# Patient Record
Sex: Male | Born: 1950
Health system: Southern US, Community
[De-identification: ages and names within clinical notes are randomized; demographics above are authoritative.]

## PROBLEM LIST (undated history)

## (undated) DIAGNOSIS — G4730 Sleep apnea, unspecified: Secondary | ICD-10-CM

## (undated) DIAGNOSIS — M25569 Pain in unspecified knee: Secondary | ICD-10-CM

## (undated) DIAGNOSIS — E119 Type 2 diabetes mellitus without complications: Secondary | ICD-10-CM

## (undated) DIAGNOSIS — G4700 Insomnia, unspecified: Secondary | ICD-10-CM

## (undated) DIAGNOSIS — R351 Nocturia: Secondary | ICD-10-CM

## (undated) DIAGNOSIS — E669 Obesity, unspecified: Secondary | ICD-10-CM

## (undated) DIAGNOSIS — K7460 Unspecified cirrhosis of liver: Secondary | ICD-10-CM

## (undated) DIAGNOSIS — F4321 Adjustment disorder with depressed mood: Secondary | ICD-10-CM

## (undated) DIAGNOSIS — K7581 Nonalcoholic steatohepatitis (NASH): Secondary | ICD-10-CM

## (undated) DIAGNOSIS — R945 Abnormal results of liver function studies: Secondary | ICD-10-CM

## (undated) DIAGNOSIS — R739 Hyperglycemia, unspecified: Secondary | ICD-10-CM

## (undated) DIAGNOSIS — I10 Essential (primary) hypertension: Secondary | ICD-10-CM

## (undated) DIAGNOSIS — E559 Vitamin D deficiency, unspecified: Secondary | ICD-10-CM

## (undated) DIAGNOSIS — Z0000 Encounter for general adult medical examination without abnormal findings: Secondary | ICD-10-CM

## (undated) DIAGNOSIS — K7469 Other cirrhosis of liver: Secondary | ICD-10-CM

## (undated) DIAGNOSIS — R5383 Other fatigue: Secondary | ICD-10-CM

## (undated) DIAGNOSIS — J209 Acute bronchitis, unspecified: Secondary | ICD-10-CM

## (undated) DIAGNOSIS — B019 Varicella without complication: Secondary | ICD-10-CM

## (undated) DIAGNOSIS — E785 Hyperlipidemia, unspecified: Secondary | ICD-10-CM

## (undated) DIAGNOSIS — L918 Other hypertrophic disorders of the skin: Secondary | ICD-10-CM

## (undated) DIAGNOSIS — F419 Anxiety disorder, unspecified: Secondary | ICD-10-CM

## (undated) DIAGNOSIS — R7989 Other specified abnormal findings of blood chemistry: Secondary | ICD-10-CM

## (undated) HISTORY — DX: Nocturia: R35.1

## (undated) HISTORY — DX: Varicella without complication: B01.9

## (undated) HISTORY — DX: Anxiety disorder, unspecified: F41.9

## (undated) HISTORY — PX: BACK SURGERY: SHX140

## (undated) HISTORY — DX: Obesity, unspecified: E66.9

## (undated) HISTORY — DX: Vitamin D deficiency, unspecified: E55.9

## (undated) HISTORY — DX: Nonalcoholic steatohepatitis (NASH): K75.81

## (undated) HISTORY — DX: Acute bronchitis, unspecified: J20.9

## (undated) HISTORY — PX: OTHER SURGICAL HISTORY: SHX169

## (undated) HISTORY — DX: Adjustment disorder with depressed mood: F43.21

## (undated) HISTORY — PX: VASECTOMY: SHX75

## (undated) HISTORY — DX: Other specified abnormal findings of blood chemistry: R79.89

## (undated) HISTORY — DX: Essential (primary) hypertension: I10

## (undated) HISTORY — PX: TONSILLECTOMY: SHX5217

## (undated) HISTORY — DX: Other fatigue: R53.83

## (undated) HISTORY — DX: Abnormal results of liver function studies: R94.5

## (undated) HISTORY — DX: Pain in unspecified knee: M25.569

## (undated) HISTORY — DX: Insomnia, unspecified: G47.00

## (undated) HISTORY — DX: Other cirrhosis of liver: K74.69

## (undated) HISTORY — DX: Type 2 diabetes mellitus without complications: E11.9

## (undated) HISTORY — PX: TONSILLECTOMY: SUR1361

## (undated) HISTORY — DX: Hyperlipidemia, unspecified: E78.5

## (undated) HISTORY — DX: Hyperglycemia, unspecified: R73.9

## (undated) HISTORY — DX: Unspecified cirrhosis of liver: K74.60

## (undated) HISTORY — DX: Encounter for general adult medical examination without abnormal findings: Z00.00

## (undated) HISTORY — DX: Other hypertrophic disorders of the skin: L91.8

## (undated) HISTORY — DX: Sleep apnea, unspecified: G47.30

---

## 2010-08-12 ENCOUNTER — Encounter: Payer: Self-pay | Admitting: Family Medicine

## 2010-08-12 ENCOUNTER — Ambulatory Visit (INDEPENDENT_AMBULATORY_CARE_PROVIDER_SITE_OTHER): Payer: PRIVATE HEALTH INSURANCE | Admitting: Family Medicine

## 2010-08-12 DIAGNOSIS — E785 Hyperlipidemia, unspecified: Secondary | ICD-10-CM

## 2010-08-12 DIAGNOSIS — G4700 Insomnia, unspecified: Secondary | ICD-10-CM

## 2010-08-12 DIAGNOSIS — Z1211 Encounter for screening for malignant neoplasm of colon: Secondary | ICD-10-CM

## 2010-08-12 DIAGNOSIS — I10 Essential (primary) hypertension: Secondary | ICD-10-CM

## 2010-08-12 DIAGNOSIS — J209 Acute bronchitis, unspecified: Secondary | ICD-10-CM

## 2010-08-12 DIAGNOSIS — R5383 Other fatigue: Secondary | ICD-10-CM

## 2010-08-12 DIAGNOSIS — M109 Gout, unspecified: Secondary | ICD-10-CM

## 2010-08-12 DIAGNOSIS — R351 Nocturia: Secondary | ICD-10-CM

## 2010-08-12 DIAGNOSIS — E669 Obesity, unspecified: Secondary | ICD-10-CM

## 2010-08-12 LAB — HEPATIC FUNCTION PANEL
ALT: 79 U/L — ABNORMAL HIGH (ref 0–53)
AST: 67 U/L — ABNORMAL HIGH (ref 0–37)
Bilirubin, Direct: 0.1 mg/dL (ref 0.0–0.3)
Total Bilirubin: 0.7 mg/dL (ref 0.3–1.2)
Total Protein: 7.8 g/dL (ref 6.0–8.3)

## 2010-08-12 LAB — CBC WITH DIFFERENTIAL/PLATELET
Basophils Relative: 0.3 % (ref 0.0–3.0)
Eosinophils Relative: 2.1 % (ref 0.0–5.0)
HCT: 50.2 % (ref 39.0–52.0)
Lymphs Abs: 2 10*3/uL (ref 0.7–4.0)
Monocytes Relative: 7.2 % (ref 3.0–12.0)
Platelets: 291 10*3/uL (ref 150.0–400.0)
RBC: 5.18 Mil/uL (ref 4.22–5.81)
WBC: 8.8 10*3/uL (ref 4.5–10.5)

## 2010-08-12 LAB — TSH: TSH: 1.09 u[IU]/mL (ref 0.35–5.50)

## 2010-08-12 LAB — RENAL FUNCTION PANEL
Chloride: 104 mEq/L (ref 96–112)
GFR: 65.38 mL/min (ref >60.00)
Phosphorus: 3.3 mg/dL (ref 2.3–4.6)
Potassium: 4.5 mEq/L (ref 3.5–5.1)

## 2010-08-12 LAB — LIPID PANEL
LDL Cholesterol: 71 mg/dL (ref 0–99)
Total CHOL/HDL Ratio: 3

## 2010-08-12 MED ORDER — CEFDINIR 300 MG PO CAPS: 300.0000 mg | ORAL_CAPSULE | Freq: Two times a day (BID) | ORAL | Status: AC

## 2010-08-12 NOTE — Patient Instructions (Addendum)
Hypertension (High Blood Pressure) As your heart beats, it forces blood through your arteries. This force is your blood pressure. If the pressure is too high, it is called hypertension (HTN) or high blood pressure. HTN is dangerous because you may have it and not know it. High blood pressure may mean that your heart has to work harder to pump blood. Your arteries may be narrow or stiff. The extra work puts you at risk for heart disease, stroke, and other problems.  Blood pressure consists of two numbers, a higher number over a lower, 110/72, for example. It is stated as "110 over 72." The ideal is below 120 for the top number (systolic) and under 80 for the bottom (diastolic). Write down your blood pressure today. You should pay close attention to your blood pressure if you have certain conditions such as:  Heart failure.  Prior heart attack.   Diabetes   Chronic kidney disease.   Prior stroke.   Multiple risk factors for heart disease.   To see if you have HTN, your blood pressure should be measured while you are seated with your arm held at the level of the heart. It should be measured at least twice. A one-time elevated blood pressure reading (especially in the Emergency Department) does not mean that you need treatment. There may be conditions in which the blood pressure is different between your right and left arms. It is important to see your caregiver soon for a recheck. Most people have essential hypertension which means that there is not a specific cause. This type of high blood pressure may be lowered by changing lifestyle factors such as:  Stress.  Smoking.   Lack of exercise.   Excessive weight.  Drug/tobacco/alcohol use.   Eating less salt.   Most people do not have symptoms from high blood pressure until it has caused damage to the body. Effective treatment can often prevent, delay or reduce that damage. TREATMENT Treatment for high blood pressure, when a cause has been  identified, is directed at the cause. There are a large number of medications to treat HTN. These fall into several categories, and your caregiver will help you select the medicines that are best for you. Medications may have side effects. You should review side effects with your caregiver. If your blood pressure stays high after you have made lifestyle changes or started on medicines,   Your medication(s) may need to be changed.   Other problems may need to be addressed.   Be certain you understand your prescriptions, and know how and when to take your medicine.   Be sure to follow up with your caregiver within the time frame advised (usually within two weeks) to have your blood pressure rechecked and to review your medications.   If you are taking more than one medicine to lower your blood pressure, make sure you know how and at what times they should be taken. Taking two medicines at the same time can result in blood pressure that is too low.  SEEK IMMEDIATE MEDICAL CARE IF YOU DEVELOP:  A severe headache, blurred or changing vision, or confusion.   Unusual weakness or numbness, or a faint feeling.   Severe chest or abdominal pain, vomiting, or breathing problems.  MAKE SURE YOU:   Understand these instructions.   Will watch your condition.   Will get help right away if you are not doing well or get worse.  Document Released: 12/27/2004 Document Re-Released: 06/16/2009 Exit[employer] Patient Information 2011 ExitCare,   LLCBronchitis Bronchitis is the body's way of reacting to injury and/or infection (inflammation) of the bronchi. Bronchi are the air tubes that extend from the windpipe into the lungs. If the inflammation becomes severe, it may cause shortness of breath.  CAUSES Inflammation may be caused by:  A virus.   Germs (bacteria).   Dust.   Allergens.   Pollutants and many other irritants.  The cells lining the bronchial tree are covered with tiny hairs (cilia). These  constantly beat upward, away from the lungs, toward the mouth. This keeps the lungs free of pollutants. When these cells become too irritated and are unable to do their job, mucus begins to develop. This causes the characteristic cough of bronchitis. The cough clears the lungs when the cilia are unable to do their job. Without either of these protective mechanisms, the mucus would settle in the lungs. Then you would develop pneumonia. Smoking is a common cause of bronchitis and can contribute to pneumonia. Stopping this habit is the single most important thing you can do to help yourself. TREATMENT  Your caregiver may prescribe an antibiotic if the cough is caused by bacteria. Also, medicines that open up your airways make it easier to breathe. Your caregiver may also recommend or prescribe an expectorant. It will loosen the mucus to be coughed up. Only take over-the-counter or prescription medicines for pain, discomfort, or fever as directed by your caregiver.   Removing whatever causes the problem (smoking, for example) is critical to preventing the problem from getting worse.   Cough suppressants may be prescribed for relief of cough symptoms.   Inhaled medicines may be prescribed to help with symptoms now and to help prevent problems from returning.   For those with recurrent (chronic) bronchitis, there may be a need for steroid medicines.  SEEK IMMEDIATE MEDICAL CARE IF:  During treatment, you develop more pus-like mucus (purulent sputum).   You or your child has an oral temperature above 104, not controlled by medicine.   Your baby is older than 3 months with a rectal temperature of 102 F (38.9 C) or higher.   Your baby is 38 months old or younger with a rectal temperature of 100.4 F (38 C) or higher.   You become progressively more ill.   You have increased difficulty breathing, wheezing, or shortness of breath.  It is necessary to seek immediate medical care if you are elderly or  sick from any other disease. MAKE SURE YOU:  Understand these instructions.   Will watch your condition.   Will get help right away if you are not doing well or get worse.  Document Released: 12/27/2004 Document Re-Released: 03/23/2009 [facility] Patient Information 2011 [place], [place].

## 2010-08-16 ENCOUNTER — Encounter: Payer: Self-pay | Admitting: Family Medicine

## 2010-08-16 ENCOUNTER — Encounter: Payer: Self-pay | Admitting: Gastroenterology

## 2010-08-16 DIAGNOSIS — J209 Acute bronchitis, unspecified: Secondary | ICD-10-CM

## 2010-08-16 DIAGNOSIS — M109 Gout, unspecified: Secondary | ICD-10-CM | POA: Insufficient documentation

## 2010-08-16 DIAGNOSIS — R5383 Other fatigue: Secondary | ICD-10-CM | POA: Insufficient documentation

## 2010-08-16 DIAGNOSIS — E669 Obesity, unspecified: Secondary | ICD-10-CM | POA: Insufficient documentation

## 2010-08-16 DIAGNOSIS — R351 Nocturia: Secondary | ICD-10-CM | POA: Insufficient documentation

## 2010-08-16 DIAGNOSIS — B269 Mumps without complication: Secondary | ICD-10-CM | POA: Insufficient documentation

## 2010-08-16 DIAGNOSIS — G4700 Insomnia, unspecified: Secondary | ICD-10-CM | POA: Insufficient documentation

## 2010-08-16 DIAGNOSIS — I10 Essential (primary) hypertension: Secondary | ICD-10-CM | POA: Insufficient documentation

## 2010-08-16 DIAGNOSIS — B059 Measles without complication: Secondary | ICD-10-CM | POA: Insufficient documentation

## 2010-08-16 HISTORY — DX: Acute bronchitis, unspecified: J20.9

## 2010-08-16 HISTORY — DX: Nocturia: R35.1

## 2010-08-16 HISTORY — DX: Insomnia, unspecified: G47.00

## 2010-08-16 MED ORDER — TESTOSTERONE 30 MG/ACT TD SOLN: 1.0000 | Freq: Every day | TRANSDERMAL | Status: DC

## 2010-08-16 NOTE — Assessment & Plan Note (Signed)
Does a great deal of traveling and uses Ambien prn, will supply him with Ambien prn to use.

## 2010-08-16 NOTE — Assessment & Plan Note (Addendum)
Has been struggling with respiratory symptoms off and on for the past couple of months but they have worsened recently. Will start a course of Cefdinir bid and Mucinex for next 10 days and patient will report if symptoms worsen or do not resolve

## 2010-08-16 NOTE — Progress Notes (Signed)
David Esparza [id number] 07-Apr-1950 08/16/2010      Progress Note New Patient  Subjective  Chief Complaint  Chief Complaint  Patient presents with  . Establish Care    new patient    HPI  Patient is a 60 yo Caucasian male in today to establish care. He splits his time between here and [place] for both personal and work related reasons. Last month he had a bad chest cold with excessive coughing and congesion  The congestion has recently begun to increase again recently. He has begun to cough at night again. No obvious f/c/CP/palp/SOB. He has noted some mild increased congeston in head. No sore throat, nausea, malaise, Wears his seat belt each daiy. Has a history of lanced external hemoorhoids  Physical Exam  Constitutional: He is oriented to person, place, and time and well-developed, well-nourished, and in no distress. No distress.  HENT:  Head: Normocephalic and atraumatic.  Eyes: Conjunctivae are normal.  Neck: Neck supple. No thyromegaly present.  Cardiovascular: Normal rate, regular rhythm and normal heart sounds.   No murmur heard. Pulmonary/Chest: Effort normal and breath sounds normal. No respiratory distress.  Abdominal: He exhibits no distension and no mass. There is no tenderness.  Musculoskeletal: He exhibits no edema.  Neurological: He is alert and oriented to person, place, and time.  Skin: Skin is warm.  Psychiatric: Memory, affect and judgment normal.    Past Medical History  Diagnosis Date  . Chicken pox as a child  . Mumps as a child  . Measles as a child  . Gout   . Hypertension   . Obese   . Fatigue   . Gout   . Acute bronchitis 08/16/2010  . Nocturia 08/16/2010  . Insomnia 08/16/2010    Past Surgical History  Procedure Date  . Intestines sewn 60 yrs old    shot once made 6 holes  . Tonsillectomy as a child  . Vasectomy 34 yrs old    Family History  Problem Relation Age of Onset  . Diabetes Mother     type 2  . Cancer Maternal Grandmother    throat  . Heart disease Maternal Grandfather   . Diabetes Maternal Grandfather     History   Social History  . Marital Status: Married    Spouse Name: N/A    Number of Children: N/A  . Years of Education: N/A   Occupational History  . Not on file.   Social History Main Topics  . Smoking status: Former Smoker    Types: Cigarettes  . Smokeless tobacco: Never Used   Comment: smoked in high school  . Alcohol Use: Yes     very little  . Drug Use: No  . Sexually Active: Yes -- Male partner(s)   Other Topics Concern  . Not on file   Social History Narrative  . No narrative on file    No current outpatient prescriptions on file prior to visit.    No Known Allergies  Review of Systems  Review of Systems  Constitutional: Positive for malaise/fatigue. Negative for fever and chills.  HENT: Positive for congestion. Negative for hearing loss and nosebleeds.   Eyes: Negative for discharge.  Respiratory: Positive for cough. Negative for sputum production, shortness of breath and wheezing.   Cardiovascular: Negative for chest pain, palpitations and leg swelling.  Gastrointestinal: Negative for heartburn, nausea, vomiting, abdominal pain, diarrhea, constipation and blood in stool.  Genitourinary: Positive for frequency. Negative for dysuria, urgency and hematuria.  Nocturia  Musculoskeletal: Negative for myalgias, back pain and falls.  Skin: Negative for rash.  Neurological: Negative for dizziness, tremors, sensory change, focal weakness, loss of consciousness, weakness and headaches.  Endo/Heme/Allergies: Negative for polydipsia. Does not bruise/bleed easily.  Psychiatric/Behavioral: Negative for depression and suicidal ideas. The patient is not nervous/anxious and does not have insomnia.     Objective  BP 133/91  Pulse 95  Temp(Src) 98.2 F (36.8 C) (Oral)  Ht 5' 5.75" (1.67 m)  Wt 249 lb (112.946 kg)  BMI 40.50 kg/m2  SpO2 94%  Physical Exam: see  above       Assessment & Plan  Acute bronchitis Has been struggling with respiratory symptoms off and on for the past couple of months but they have worsened recently. Will start a course of Cefdinir bid and Mucinex for next 10 days and patient will report if symptoms worsen or do not resolve  Gout Has been trouble with some pain in his great toe off and on, no redness, warmth or swelling. Will maintain Allopurinol and good hydration and check a uric acid level  Hypertension Adequate control at today's visit, continue current meds and reevaluate at next visit.  Insomnia Does a great deal of traveling and uses Ambien prn, will supply him with Ambien prn to use.  Fatigue Patient travels a lot and sturggles with fatigue frequently but he does feel it has been worse recently. Will check TSH, CBC, renal and testosterone levels

## 2010-08-16 NOTE — Assessment & Plan Note (Signed)
Patient travels a lot and sturggles with fatigue frequently but he does feel it has been worse recently. Will check TSH, CBC, renal and testosterone levels

## 2010-08-16 NOTE — Assessment & Plan Note (Signed)
Has been trouble with some pain in his great toe off and on, no redness, warmth or swelling. Will maintain Allopurinol and good hydration and check a uric acid level

## 2010-08-16 NOTE — Assessment & Plan Note (Signed)
Adequate control at today's visit, continue current meds and reevaluate at next visit.

## 2010-08-31 ENCOUNTER — Ambulatory Visit (AMBULATORY_SURGERY_CENTER): Payer: PRIVATE HEALTH INSURANCE

## 2010-08-31 VITALS — Ht 67.0 in | Wt 250.6 lb

## 2010-08-31 DIAGNOSIS — Z1211 Encounter for screening for malignant neoplasm of colon: Secondary | ICD-10-CM

## 2010-08-31 MED ORDER — SUPREP BOWEL PREP KIT 17.5-3.13-1.6 GM/177ML PO SOLN: 1.0000 | Freq: Once | ORAL | Status: DC

## 2010-09-14 ENCOUNTER — Other Ambulatory Visit: Payer: PRIVATE HEALTH INSURANCE | Admitting: Gastroenterology

## 2010-09-16 ENCOUNTER — Encounter: Payer: Self-pay | Admitting: Gastroenterology

## 2010-09-16 ENCOUNTER — Ambulatory Visit (AMBULATORY_SURGERY_CENTER): Payer: PRIVATE HEALTH INSURANCE | Admitting: Gastroenterology

## 2010-09-16 VITALS — BP 123/89 | HR 85 | Temp 98.5°F | Resp 22 | Ht 67.0 in | Wt 240.0 lb

## 2010-09-16 DIAGNOSIS — K5730 Diverticulosis of large intestine without perforation or abscess without bleeding: Secondary | ICD-10-CM

## 2010-09-16 DIAGNOSIS — Z1211 Encounter for screening for malignant neoplasm of colon: Secondary | ICD-10-CM

## 2010-09-16 HISTORY — PX: COLONOSCOPY: SHX174

## 2010-09-16 MED ORDER — SODIUM CHLORIDE 0.9 % IV SOLN: 500.0000 mL | INTRAVENOUS | Status: DC

## 2010-09-16 NOTE — Patient Instructions (Signed)
Diverticulosis Diverticulosis is a common condition that develops when small pouches (diverticula) form in the wall of the colon. The risk of diverticulosis increases with age. It happens more often in people who eat a low-fiber diet. Most individuals with diverticulosis have no symptoms. Those individuals with symptoms usually experience belly (abdominal) pain, constipation, or loose stools (diarrhea). HOME CARE INSTRUCTIONS  Increase the amount of fiber in your diet as directed by your caregiver or dietician. This may reduce symptoms of diverticulosis.   Your caregiver may recommend taking a dietary fiber supplement.   Drink at least 6 to 8 glasses of water each day to prevent constipation.   Try not to strain when you have a bowel movement.   Your caregiver may recommend avoiding nuts and seeds to prevent complications, although this is still an uncertain benefit.   Only take over-the-counter or prescription medicines for pain, discomfort, or fever as directed by your caregiver.  FOODS HAVING HIGH FIBER CONTENT INCLUDE:  Fruits. Apple, peach, pear, tangerine, raisins, prunes.   Vegetables. Brussels sprouts, asparagus, broccoli, cabbage, carrot, cauliflower, romaine lettuce, spinach, summer squash, tomato, winter squash, zucchini.   Starchy Vegetables. Baked beans, kidney beans, lima beans, split peas, lentils, potatoes (with skin).   Grains. Whole wheat bread, brown rice, bran flake cereal, plain oatmeal, white rice, shredded wheat, bran muffins.  SEEK IMMEDIATE MEDICAL CARE IF:  You develop increasing pain or severe bloating.   You have an oral temperature above 100, not controlled by medicine.   You develop vomiting or bowel movements that are bloody or black.  Document Released: 09/24/2003 Document Re-Released: 06/16/2009 [facility] Patient Information 2011 [place], [place].

## 2010-09-17 ENCOUNTER — Telehealth: Payer: Self-pay

## 2010-09-17 NOTE — Telephone Encounter (Signed)
Line busy. No answer. °

## 2011-01-20 ENCOUNTER — Ambulatory Visit (INDEPENDENT_AMBULATORY_CARE_PROVIDER_SITE_OTHER): Payer: PRIVATE HEALTH INSURANCE | Admitting: Family Medicine

## 2011-01-20 ENCOUNTER — Encounter: Payer: Self-pay | Admitting: Family Medicine

## 2011-01-20 VITALS — BP 145/90 | HR 92 | Temp 99.4°F | Wt 257.0 lb

## 2011-01-20 DIAGNOSIS — K5732 Diverticulitis of large intestine without perforation or abscess without bleeding: Secondary | ICD-10-CM

## 2011-01-20 DIAGNOSIS — K5792 Diverticulitis of intestine, part unspecified, without perforation or abscess without bleeding: Secondary | ICD-10-CM

## 2011-01-20 MED ORDER — CIPROFLOXACIN HCL 750 MG PO TABS: 750.0000 mg | ORAL_TABLET | Freq: Two times a day (BID) | ORAL | Status: AC

## 2011-01-20 MED ORDER — METRONIDAZOLE 500 MG PO TABS: 500.0000 mg | ORAL_TABLET | Freq: Three times a day (TID) | ORAL | Status: AC

## 2011-01-20 NOTE — Progress Notes (Signed)
OFFICE NOTE  01/20/2011  CC:  Chief Complaint  Patient presents with  . Abdominal Pain    began 1-2 days ago, denies fever, vomiting or diarrhea, lower abdomen     HPI: Patient is a 61 y.o. Caucasian male patient of Dr. Mariel Aloe who is here for stomach cramps. Onset 24 h ago, lower abdomen achy, occasionally a twinge of more severe sharp pain.  Some nausea but no vomiting.  No diarrhea or blood in stool.  No fever.   Says similar illness has occurred a few times in the past, lasts 2-3 d and resolves.   He came in this time b/c since last occurrence of this he has had a colonoscopy and it showed diverticulosis. Has been eating lots of nuts and seeds lately.  Pertinent PMH:  Gout HTN Obesity Diverticulosis  Past Surgical History  Procedure Date  . Intestines sewn 61 yrs old    shot once made 6 holes  . Tonsillectomy as a child  . Vasectomy 89 yrs old  . Colonoscopy 09/16/10    Dr. Arlyce Dice: moderate diverticulosis sigmoid and descending colon, o/w normal: repeat 10 yrs.    Pertinent Meds: Hyzaar 100/25, ambien 10mg , allopurinol, testosterone gel, MVI, fish oil caps, vit B12 tabs  PE: Blood pressure 145/90, pulse 92, temperature 99.4 F (37.4 C), temperature source Temporal, weight 257 lb (116.574 kg). Gen: Alert, well appearing.  Patient is oriented to person, place, time, and situation. CV: RRR, no m/r/g.   LUNGS: CTA bilat, nonlabored resps, good aeration in all lung fields. ABD: soft, NT, ND, BS normal.  No hepatospenomegaly or mass.  No bruits.  If I press very firmly in lower abdomen he can feel mild tenderness.  EXT: no clubbing, cyanosis, or edema.    IMPRESSION AND PLAN: Mild diverticulitis likely. Cipro 750 mg bid x 10d and flagyl 500mg  tid x 10d. Discussed diet, gave handouts, discussed s/s of worsening illness and what to seek further care for.  FOLLOW UP: prn

## 2011-01-20 NOTE — Patient Instructions (Signed)
Diverticulosis Diverticulosis is a common condition that develops when small pouches (diverticula) form in the wall of the colon. The risk of diverticulosis increases with age. It happens more often in people who eat a low-fiber diet. Most individuals with diverticulosis have no symptoms. Those individuals with symptoms usually experience abdominal pain, constipation, or loose stools (diarrhea). HOME CARE INSTRUCTIONS   Increase the amount of fiber in your diet as directed by your caregiver or dietician. This may reduce symptoms of diverticulosis.   Your caregiver may recommend taking a dietary fiber supplement.   Drink at least 6 to 8 glasses of water each day to prevent constipation.   Try not to strain when you have a bowel movement.   Your caregiver may recommend avoiding nuts and seeds to prevent complications, although this is still an uncertain benefit.   Only take over-the-counter or prescription medicines for pain, discomfort, or fever as directed by your caregiver.  FOODS WITH HIGH FIBER CONTENT INCLUDE:  Fruits. Apple, peach, pear, tangerine, raisins, prunes.   Vegetables. Brussels sprouts, asparagus, broccoli, cabbage, carrot, cauliflower, romaine lettuce, spinach, summer squash, tomato, winter squash, zucchini.   Starchy Vegetables. Baked beans, kidney beans, lima beans, split peas, lentils, potatoes (with skin).   Grains. Whole wheat bread, brown rice, bran flake cereal, plain oatmeal, white rice, shredded wheat, bran muffins.  SEEK IMMEDIATE MEDICAL CARE IF:   You develop increasing pain or severe bloating.   You have an oral temperature above 102 F (38.9 C), not controlled by medicine.   You develop vomiting or bowel movements that are bloody or black.  Document Released: 09/24/2003 Document Revised: 09/08/2010 Document Reviewed: 05/27/2009 [employer] Patient Information 2012 [employer]. 

## 2011-03-07 ENCOUNTER — Ambulatory Visit (INDEPENDENT_AMBULATORY_CARE_PROVIDER_SITE_OTHER): Payer: PRIVATE HEALTH INSURANCE | Admitting: Family Medicine

## 2011-03-07 ENCOUNTER — Encounter: Payer: Self-pay | Admitting: Family Medicine

## 2011-03-07 DIAGNOSIS — R351 Nocturia: Secondary | ICD-10-CM

## 2011-03-07 DIAGNOSIS — J329 Chronic sinusitis, unspecified: Secondary | ICD-10-CM

## 2011-03-07 DIAGNOSIS — J209 Acute bronchitis, unspecified: Secondary | ICD-10-CM

## 2011-03-07 DIAGNOSIS — I10 Essential (primary) hypertension: Secondary | ICD-10-CM

## 2011-03-07 LAB — HEPATIC FUNCTION PANEL
ALT: 79 U/L — ABNORMAL HIGH (ref 0–53)
AST: 59 U/L — ABNORMAL HIGH (ref 0–37)
Albumin: 4.4 g/dL (ref 3.5–5.2)

## 2011-03-07 LAB — CBC
RBC: 5.65 Mil/uL (ref 4.22–5.81)
RDW: 12.8 % (ref 11.5–14.6)
WBC: 8.9 10*3/uL (ref 4.5–10.5)

## 2011-03-07 LAB — RENAL FUNCTION PANEL
BUN: 19 mg/dL (ref 6–23)
CO2: 29 mEq/L (ref 19–32)
Calcium: 10.4 mg/dL (ref 8.4–10.5)
Creatinine, Ser: 1.1 mg/dL (ref 0.4–1.5)
GFR: 71.4 mL/min (ref >60.00)
Glucose, Bld: 103 mg/dL — ABNORMAL HIGH (ref 70–99)

## 2011-03-07 LAB — PSA: PSA: 0.41 ng/mL (ref 0.10–4.00)

## 2011-03-07 MED ORDER — AMOXICILLIN-POT CLAVULANATE 875-125 MG PO TABS: 1.0000 | ORAL_TABLET | Freq: Two times a day (BID) | ORAL | Status: AC

## 2011-03-07 MED ORDER — GUAIFENESIN ER 600 MG PO TB12: 600.0000 mg | ORAL_TABLET | Freq: Two times a day (BID) | ORAL | Status: DC

## 2011-03-07 NOTE — Patient Instructions (Signed)

## 2011-03-08 ENCOUNTER — Telehealth: Payer: Self-pay

## 2011-03-08 NOTE — Assessment & Plan Note (Signed)
Given rx for Augmentin and Mucinex, increase rest and fluids, call if symptoms worsen

## 2011-03-08 NOTE — Telephone Encounter (Signed)
Patient informed and states understandment 

## 2011-03-08 NOTE — Assessment & Plan Note (Signed)
Adequately controlled, no changes to meds. 

## 2011-03-08 NOTE — Progress Notes (Signed)
Patient ID: David Esparza, male   DOB: [birth year], 61 y.o.   MRN: [id number] CASWELL ALVILLAR [id number] 1950/07/27 03/08/2011      Progress Note-Follow Up  Subjective  Chief Complaint  Chief Complaint  Patient presents with  . Cough    X 7 days w/ phlegm (yellow- white this morning)    HPI   patient is a 61 year old Caucasian male who is in today he's got some sharp discomfort when he breathes deeply. He notes head congestion but no rhinorrhea. Has intermittent sore throat. Headache is just began today. Cough is productive of yellowish phlegm. He has been using Nasonex twice a day. He's had some low-grade fevers but no chills. Denies any gastrointestinal symptoms such as nausea vomiting and diarrhea.with 4-5 day history of worsening congestion. He struggled with malaise, fatigue, chest tightness and wheezing.  Past Medical History  Diagnosis Date  . Chicken pox as a child  . Mumps as a child  . Measles as a child  . Gout   . Hypertension   . Obese   . Fatigue   . Gout   . Acute bronchitis 08/16/2010  . Nocturia 08/16/2010  . Insomnia 08/16/2010    Past Surgical History  Procedure Date  . Intestines sewn 61 yrs old    shot once made 6 holes  . Tonsillectomy as a child  . Vasectomy 22 yrs old  . Colonoscopy 09/16/10    Dr. Arlyce Dice: moderate diverticulosis sigmoid and descending colon, o/w normal: repeat 10 yrs.    Family History  Problem Relation Age of Onset  . Diabetes Mother     type 2  . Cancer Maternal Grandmother     throat  . Heart disease Maternal Grandfather   . Diabetes Maternal Grandfather   . Colon polyps Neg Hx     History   Social History  . Marital Status: Married    Spouse Name: N/A    Number of Children: N/A  . Years of Education: N/A   Occupational History  . Not on file.   Social History Main Topics  . Smoking status: Former Smoker    Types: Cigarettes    Quit date: 08/30/1968  . Smokeless tobacco: Never Used   Comment: smoked in high school   . Alcohol Use: No     very little  . Drug Use: No  . Sexually Active: Yes -- Male partner(s)   Other Topics Concern  . Not on file   Social History Narrative  . No narrative on file    Current Outpatient Prescriptions on File Prior to Visit  Medication Sig Dispense Refill  . Cyanocobalamin (B-12 PO) Take by mouth daily.        . fish oil-omega-3 fatty acids 1000 MG capsule Take 2 g by mouth 2 (two) times daily.        Marland Kitchen losartan-hydrochlorothiazide (HYZAAR) 100-25 MG per tablet Take 1 tablet by mouth daily.        . multivitamin (THERAGRAN) tablet Take 1 tablet by mouth daily.        . Testosterone (AXIRON) 30 MG/ACT SOLN Place 1 Squirt onto the skin daily. 1 pump per clean, dry axillae qam  90 mL  2  . zolpidem (AMBIEN) 10 MG tablet Take 10 mg by mouth at bedtime as needed.          No Known Allergies  Review of Systems  Review of Systems  Constitutional: Positive for fever and malaise/fatigue. Negative for chills.  HENT:  Positive for congestion and sore throat.   Eyes: Negative for discharge.  Respiratory: Positive for cough, sputum production, shortness of breath and wheezing.   Cardiovascular: Negative for chest pain, palpitations and leg swelling.  Gastrointestinal: Negative for nausea, abdominal pain and diarrhea.  Genitourinary: Negative for dysuria.  Musculoskeletal: Negative for falls.  Skin: Negative for rash.  Neurological: Negative for loss of consciousness and headaches.  Endo/Heme/Allergies: Negative for polydipsia.  Psychiatric/Behavioral: Negative for depression and suicidal ideas. The patient is not nervous/anxious and does not have insomnia.     Objective  BP 145/83  Pulse 98  Temp(Src) 98.6 F (37 C) (Temporal)  Ht 5\' 7"  (1.702 m)  Wt 249 lb 12.8 oz (113.309 kg)  BMI 39.12 kg/m2  SpO2 94%  Physical Exam  Physical Exam  Constitutional: He is oriented to person, place, and time and well-developed, well-nourished, and in no distress. No  distress.  HENT:  Head: Normocephalic and atraumatic.        Oropharynx erythematous, nasal mucosa erythematous and boggy  Eyes: Conjunctivae are normal.  Neck: Neck supple. No thyromegaly present.  Cardiovascular: Normal rate, regular rhythm and normal heart sounds.   No murmur heard. Pulmonary/Chest: Effort normal and breath sounds normal. No respiratory distress.  Abdominal: He exhibits no distension and no mass. There is no tenderness.  Musculoskeletal: He exhibits no edema.  Neurological: He is alert and oriented to person, place, and time.  Skin: Skin is warm.  Psychiatric: Memory, affect and judgment normal.    Lab Results  Component Value Date   TSH 1.09 08/12/2010   Lab Results  Component Value Date   WBC 8.9 03/07/2011   HGB 18.5* 03/07/2011   HCT 54.1* 03/07/2011   MCV 95.8 03/07/2011   PLT 249.0 03/07/2011   Lab Results  Component Value Date   CREATININE 1.1 03/07/2011   BUN 19 03/07/2011   NA 141 03/07/2011   K 4.2 03/07/2011   CL 104 03/07/2011   CO2 29 03/07/2011   Lab Results  Component Value Date   ALT 79* 03/07/2011   AST 59* 03/07/2011   ALKPHOS 64 03/07/2011   BILITOT 1.0 03/07/2011   Lab Results  Component Value Date   CHOL 146 08/12/2010   Lab Results  Component Value Date   HDL 50.20 08/12/2010   Lab Results  Component Value Date   LDLCALC 71 08/12/2010   Lab Results  Component Value Date   TRIG 122.0 08/12/2010   Lab Results  Component Value Date   CHOLHDL 3 08/12/2010     Assessment & Plan  Hypertension Adequately controlled, no changes to meds.  Acute bronchitis Given rx for Augmentin and Mucinex, increase rest and fluids, call if symptoms worsen

## 2011-03-08 NOTE — Telephone Encounter (Signed)
Closed phone note on accident. Left a message for patient to return my call to get lab results

## 2011-08-29 ENCOUNTER — Other Ambulatory Visit: Payer: Self-pay

## 2011-08-29 NOTE — Telephone Encounter (Signed)
We received an RX request for Axiron. Per MD's notes pt needs to follow up around 09-04-11 and have labs the week before. No appts are scheduled. Pt needs appts before refilling medication

## 2011-08-30 ENCOUNTER — Other Ambulatory Visit (INDEPENDENT_AMBULATORY_CARE_PROVIDER_SITE_OTHER): Payer: PRIVATE HEALTH INSURANCE

## 2011-08-30 ENCOUNTER — Telehealth: Payer: Self-pay | Admitting: Family Medicine

## 2011-08-30 DIAGNOSIS — Z0000 Encounter for general adult medical examination without abnormal findings: Secondary | ICD-10-CM

## 2011-08-30 DIAGNOSIS — R5383 Other fatigue: Secondary | ICD-10-CM

## 2011-08-30 LAB — LIPID PANEL
LDL Cholesterol: 104 mg/dL — ABNORMAL HIGH (ref 0–99)
VLDL: 37.2 mg/dL (ref 0.0–40.0)

## 2011-08-30 LAB — TSH: TSH: 3.49 u[IU]/mL (ref 0.35–5.50)

## 2011-08-30 LAB — CBC
HCT: 52.7 % — ABNORMAL HIGH (ref 39.0–52.0)
MCHC: 33.5 g/dL (ref 30.0–36.0)
RDW: 13.4 % (ref 11.5–14.6)
WBC: 9.2 10*3/uL (ref 4.5–10.5)

## 2011-08-30 LAB — RENAL FUNCTION PANEL
Glucose, Bld: 115 mg/dL — ABNORMAL HIGH (ref 70–99)
Phosphorus: 3.6 mg/dL (ref 2.3–4.6)
Potassium: 4.1 mEq/L (ref 3.5–5.1)
Sodium: 140 mEq/L (ref 135–145)

## 2011-08-30 LAB — HEPATIC FUNCTION PANEL
AST: 42 U/L — ABNORMAL HIGH (ref 0–37)
Alkaline Phosphatase: 80 U/L (ref 39–117)
Bilirubin, Direct: 0.1 mg/dL (ref 0.0–0.3)
Total Bilirubin: 0.6 mg/dL (ref 0.3–1.2)

## 2011-08-30 MED ORDER — TESTOSTERONE 30 MG/ACT TD SOLN: 1.0000 | Freq: Every day | TRANSDERMAL | Status: DC

## 2011-08-30 NOTE — Telephone Encounter (Signed)
Pt did labs this morning and has an appt set up for next Tues. I will send a month supply to [facility] per pts request

## 2011-08-30 NOTE — Telephone Encounter (Signed)
So he needs labs and a visit before I can give him good advice, need to discuss how he feels on the med and check his levels, he can have a one month supply without refills til he is able to come in

## 2011-08-30 NOTE — Telephone Encounter (Signed)
Please advise?  Per Diane pt is informed that MD will not be back until tomorrow.

## 2011-08-30 NOTE — Telephone Encounter (Signed)
Patient is out of Axiron. Patient is requesting a refill only if Dr Abner Greenspan wants him to continue the med. He will call back to let us know which pharmacy he wants the Rx to go to, he couldn't remember the name. If there is a discount coupon please let him know. He said please leave a mess on his cell if he doesn't answer.

## 2011-09-06 ENCOUNTER — Ambulatory Visit (INDEPENDENT_AMBULATORY_CARE_PROVIDER_SITE_OTHER): Payer: PRIVATE HEALTH INSURANCE | Admitting: Family Medicine

## 2011-09-06 ENCOUNTER — Encounter: Payer: Self-pay | Admitting: Family Medicine

## 2011-09-06 ENCOUNTER — Encounter: Payer: PRIVATE HEALTH INSURANCE | Admitting: Family Medicine

## 2011-09-06 VITALS — BP 137/89 | HR 79 | Temp 97.2°F | Ht 67.0 in | Wt 253.0 lb

## 2011-09-06 DIAGNOSIS — E1169 Type 2 diabetes mellitus with other specified complication: Secondary | ICD-10-CM | POA: Insufficient documentation

## 2011-09-06 DIAGNOSIS — F419 Anxiety disorder, unspecified: Secondary | ICD-10-CM | POA: Insufficient documentation

## 2011-09-06 DIAGNOSIS — R7309 Other abnormal glucose: Secondary | ICD-10-CM

## 2011-09-06 DIAGNOSIS — R739 Hyperglycemia, unspecified: Secondary | ICD-10-CM

## 2011-09-06 DIAGNOSIS — F411 Generalized anxiety disorder: Secondary | ICD-10-CM

## 2011-09-06 DIAGNOSIS — G4730 Sleep apnea, unspecified: Secondary | ICD-10-CM | POA: Insufficient documentation

## 2011-09-06 DIAGNOSIS — Z0000 Encounter for general adult medical examination without abnormal findings: Secondary | ICD-10-CM | POA: Insufficient documentation

## 2011-09-06 DIAGNOSIS — E119 Type 2 diabetes mellitus without complications: Secondary | ICD-10-CM | POA: Insufficient documentation

## 2011-09-06 DIAGNOSIS — L578 Other skin changes due to chronic exposure to nonionizing radiation: Secondary | ICD-10-CM

## 2011-09-06 DIAGNOSIS — R945 Abnormal results of liver function studies: Secondary | ICD-10-CM

## 2011-09-06 DIAGNOSIS — E785 Hyperlipidemia, unspecified: Secondary | ICD-10-CM

## 2011-09-06 DIAGNOSIS — E669 Obesity, unspecified: Secondary | ICD-10-CM

## 2011-09-06 DIAGNOSIS — G4700 Insomnia, unspecified: Secondary | ICD-10-CM

## 2011-09-06 DIAGNOSIS — R7989 Other specified abnormal findings of blood chemistry: Secondary | ICD-10-CM

## 2011-09-06 DIAGNOSIS — M109 Gout, unspecified: Secondary | ICD-10-CM

## 2011-09-06 DIAGNOSIS — I10 Essential (primary) hypertension: Secondary | ICD-10-CM

## 2011-09-06 DIAGNOSIS — K769 Liver disease, unspecified: Secondary | ICD-10-CM

## 2011-09-06 DIAGNOSIS — R351 Nocturia: Secondary | ICD-10-CM

## 2011-09-06 HISTORY — DX: Abnormal results of liver function studies: R94.5

## 2011-09-06 HISTORY — DX: Hyperlipidemia, unspecified: E78.5

## 2011-09-06 HISTORY — DX: Other specified abnormal findings of blood chemistry: R79.89

## 2011-09-06 HISTORY — DX: Anxiety disorder, unspecified: F41.9

## 2011-09-06 HISTORY — DX: Sleep apnea, unspecified: G47.30

## 2011-09-06 HISTORY — DX: Encounter for general adult medical examination without abnormal findings: Z00.00

## 2011-09-06 HISTORY — DX: Hyperglycemia, unspecified: R73.9

## 2011-09-06 LAB — RENAL FUNCTION PANEL
Chloride: 106 mEq/L (ref 96–112)
GFR: 66.43 mL/min (ref >60.00)
Glucose, Bld: 123 mg/dL — ABNORMAL HIGH (ref 70–99)
Phosphorus: 2.9 mg/dL (ref 2.3–4.6)
Potassium: 4.7 mEq/L (ref 3.5–5.1)
Sodium: 141 mEq/L (ref 135–145)

## 2011-09-06 LAB — HEMOGLOBIN A1C: Hgb A1c MFr Bld: 5.6 % (ref 4.6–6.5)

## 2011-09-06 LAB — PSA: PSA: 0.86 ng/mL (ref 0.10–4.00)

## 2011-09-06 LAB — CBC
MCHC: 33.9 g/dL (ref 30.0–36.0)
RDW: 13.2 % (ref 11.5–14.6)
WBC: 7.1 10*3/uL (ref 4.5–10.5)

## 2011-09-06 MED ORDER — ASPIRIN EC 81 MG PO TBEC: 81.0000 mg | DELAYED_RELEASE_TABLET | Freq: Every day | ORAL | Status: DC

## 2011-09-06 MED ORDER — ZOLPIDEM TARTRATE 10 MG PO TABS: 10.0000 mg | ORAL_TABLET | Freq: Every evening | ORAL | Status: DC | PRN

## 2011-09-06 MED ORDER — ALPRAZOLAM 1 MG PO TABS: 1.0000 mg | ORAL_TABLET | Freq: Two times a day (BID) | ORAL | Status: DC | PRN

## 2011-09-06 NOTE — Assessment & Plan Note (Signed)
asymptomatic

## 2011-09-06 NOTE — Progress Notes (Signed)
Patient ID: David Esparza, male   DOB: 1950-06-14, 61 y.o.   MRN: [id number] David Esparza [id number] February 18, 1950 09/06/2011      Progress Note New Patient  Subjective  Chief Complaint  Chief Complaint  Patient presents with  . Annual Exam    physical    HPI  Patient is a 61 year old Caucasian male in today for annual exam. His major complaint is of fatigue. As we discussed on he conveys a history of sleep apnea for which she's previously used his CPAP is seen but he is presently not using. When he takes Ambien he gets a good 6-8 hour sleep but otherwise he is up frequently mostly he believes to urinate. He does suffer occasional episodes of incontinence when he takes his Ambien as well. Hematologist snoring and fatigue but does not acknowledge chronic apnea. Does not wake up with headaches. No chest pain, palpitations, shortness rate no recent gouty episodes noted. His wife has had to remain back in [place] since he transferred her travels but it and often eats poorly since he is on his own.  Past Medical History  Diagnosis Date  . Chicken pox as a child  . Mumps as a child  . Measles as a child  . Gout   . Hypertension   . Obese   . Fatigue   . Gout   . Acute bronchitis 08/16/2010  . Nocturia 08/16/2010  . Insomnia 08/16/2010  . Preventative health care 09/06/2011  . Sleep apnea 09/06/2011  . Anxiety 09/06/2011    Past Surgical History  Procedure Date  . Intestines sewn 61 yrs old    shot once made 6 holes  . Tonsillectomy as a child  . Vasectomy 4 yrs old  . Colonoscopy 09/16/10    Dr. Arlyce Dice: moderate diverticulosis sigmoid and descending colon, o/w normal: repeat 10 yrs.    Family History  Problem Relation Age of Onset  . Diabetes Mother     type 2  . Cancer Maternal Grandmother     throat  . Heart disease Maternal Grandfather   . Diabetes Maternal Grandfather   . Colon polyps Neg Hx     History   Social History  . Marital Status: Married    Spouse Name: N/A   Number of Children: N/A  . Years of Education: N/A   Occupational History  . Not on file.   Social History Main Topics  . Smoking status: Former Smoker    Types: Cigarettes    Quit date: 08/30/1968  . Smokeless tobacco: Never Used   Comment: smoked in high school  . Alcohol Use: No     very little  . Drug Use: No  . Sexually Active: Yes -- Male partner(s)   Other Topics Concern  . Not on file   Social History Narrative  . No narrative on file    Current Outpatient Prescriptions on File Prior to Visit  Medication Sig Dispense Refill  . Cyanocobalamin (B-12 PO) Take by mouth daily.        Marland Kitchen losartan-hydrochlorothiazide (HYZAAR) 100-25 MG per tablet Take 1 tablet by mouth daily.        . Testosterone (AXIRON) 30 MG/ACT SOLN Place 1 Squirt onto the skin daily. 1 pump per clean, dry axillae qam  90 mL  0  . DISCONTD: zolpidem (AMBIEN) 10 MG tablet Take 10 mg by mouth at bedtime as needed.        . fish oil-omega-3 fatty acids 1000 MG capsule Take  2 g by mouth 2 (two) times daily.        . multivitamin (THERAGRAN) tablet Take 1 tablet by mouth daily.          No Known Allergies  Review of Systems  Review of Systems  Constitutional: Positive for malaise/fatigue. Negative for fever and chills.  HENT: Negative for hearing loss, nosebleeds and congestion.   Eyes: Negative for discharge.  Respiratory: Negative for cough, sputum production, shortness of breath and wheezing.   Cardiovascular: Negative for chest pain, palpitations and leg swelling.  Gastrointestinal: Negative for heartburn, nausea, vomiting, abdominal pain, diarrhea, constipation and blood in stool.  Genitourinary: Negative for dysuria, urgency, frequency and hematuria.  Musculoskeletal: Negative for myalgias, back pain and falls.  Skin: Negative for rash.  Neurological: Negative for dizziness, tremors, sensory change, focal weakness, loss of consciousness, weakness and headaches.  Endo/Heme/Allergies: Negative  for polydipsia. Does not bruise/bleed easily.  Psychiatric/Behavioral: Negative for depression and suicidal ideas. The patient is nervous/anxious and has insomnia.     Objective  BP 137/89  Pulse 79  Temp 97.2 F (36.2 C) (Temporal)  Ht 5\' 7"  (1.702 m)  Wt 253 lb (114.76 kg)  BMI 39.63 kg/m2  SpO2 95%  Physical Exam  Physical Exam  Constitutional: He is oriented to person, place, and time and well-developed, well-nourished, and in no distress. No distress.  HENT:  Head: Normocephalic and atraumatic.  Eyes: Conjunctivae are normal.  Neck: Neck supple. No thyromegaly present.  Cardiovascular: Normal rate, regular rhythm and normal heart sounds.   No murmur heard. Pulmonary/Chest: Effort normal and breath sounds normal. No respiratory distress.  Abdominal: He exhibits no distension and no mass. There is no tenderness.  Musculoskeletal: He exhibits no edema.  Neurological: He is alert and oriented to person, place, and time.  Skin: Skin is warm.  Psychiatric: Memory, affect and judgment normal.       Assessment & Plan  Nocturia Persistent but has had a complete urologic work up. Needs decreased fluid intake in evenings.  Hypertension Mild elevation, minimize sodium but no change in medications  Insomnia Multifactorial, patient acknowledges he has a cpap machine he got in [place] but he has not been using it, he agrees to start using it again and may use Ambien prn as well  Obese Given DASH diet handout and encouraged to increase physical activity and decrease po intake  Preventative health care Encouraged adequate sleep, heart healthy diet, regular exercise  Sleep apnea Agrees to start trying his CPAP machine and he agrees to supply [place] with the name of the company that supplied it.   Anxiety Patient continues to live away from his wife who had to stay behind in [place] and his work is very stressful.Given a prescription for Ativan 1 mg to use prn which has used  before. He has been advised that he needs to use it infrequently or it can become habituating, if he finds he needs it regularly we need to consider a daily SSRI  Hyperglycemia Sugar  elevated but hgba1c is OK, avoid simple carbs and increase exercise  Abnormal LFTs Persistent, will check an acute hepatic panel, patient encouraged low carb and fat intake to manage likely fatty liver disease  Gout asymptomatic  Hyperlipidemia Avoid trans fats, increase exercise, add a fatty acid supplement, avoid simple carbs.

## 2011-09-06 NOTE — Assessment & Plan Note (Signed)
Persistent, will check an acute hepatic panel, patient encouraged low carb and fat intake to manage likely fatty liver disease

## 2011-09-06 NOTE — Patient Instructions (Addendum)
Preventive Care for Adults, Male A healthy lifestyle and preventative care can promote health and wellness. Preventative health guidelines for men include the following key practices:  A routine yearly physical is a good way to check with your caregiver about your health and preventative screening. It is a chance to share any concerns and updates on your health, and to receive a thorough exam.   Visit your dentist for a routine exam and preventative care every 6 months. Brush your teeth twice a day and floss once a day. Good oral hygiene prevents tooth decay and gum disease.   The frequency of eye exams is based on your age, health, family medical history, use of contact lenses, and other factors. Follow your caregiver's recommendations for frequency of eye exams.   Eat a healthy diet. Foods like vegetables, fruits, whole grains, low-fat dairy products, and lean protein foods contain the nutrients you need without too many calories. Decrease your intake of foods high in solid fats, added sugars, and salt. Eat the right amount of calories for you.Get information about a proper diet from your caregiver, if necessary.   Regular physical exercise is one of the most important things you can do for your health. Most adults should get at least 150 minutes of moderate-intensity exercise (any activity that increases your heart rate and causes you to sweat) each week. In addition, most adults need muscle-strengthening exercises on 2 or more days a week.   Maintain a healthy weight. The body mass index (BMI) is a screening tool to identify possible weight problems. It provides an estimate of body fat based on height and weight. Your caregiver can help determine your BMI, and can help you achieve or maintain a healthy weight.For adults 20 years and older:   A BMI below 18.5 is considered underweight.   A BMI of 18.5 to 24.9 is normal.   A BMI of 25 to 29.9 is considered overweight.   A BMI of 30 and above  is considered obese.   Maintain normal blood lipids and cholesterol levels by exercising and minimizing your intake of saturated fat. Eat a balanced diet with plenty of fruit and vegetables. Blood tests for lipids and cholesterol should begin at age 20 and be repeated every 5 years. If your lipid or cholesterol levels are high, you are over 50, or you are a high risk for heart disease, you may need your cholesterol levels checked more frequently.Ongoing high lipid and cholesterol levels should be treated with medicines if diet and exercise are not effective.   If you smoke, find out from your caregiver how to quit. If you do not use tobacco, do not start.   If you choose to drink alcohol, do not exceed 2 drinks per day. One drink is considered to be 12 ounces (355 mL) of beer, 5 ounces (148 mL) of wine, or 1.5 ounces (44 mL) of liquor.   Avoid use of street drugs. Do not share needles with anyone. Ask for help if you need support or instructions about stopping the use of drugs.   High blood pressure causes heart disease and increases the risk of stroke. Your blood pressure should be checked at least every 1 to 2 years. Ongoing high blood pressure should be treated with medicines, if weight loss and exercise are not effective.   If you are 45 to 62 years old, ask your caregiver if you should take aspirin to prevent heart disease.   Diabetes screening involves taking a blood   sample to check your fasting blood sugar level. This should be done once every 3 years, after age 45, if you are within normal weight and without risk factors for diabetes. Testing should be considered at a younger age or be carried out more frequently if you are overweight and have at least 1 risk factor for diabetes.   Colorectal cancer can be detected and often prevented. Most routine colorectal cancer screening begins at the age of 50 and continues through age 75. However, your caregiver may recommend screening at an earlier  age if you have risk factors for colon cancer. On a yearly basis, your caregiver may provide home test kits to check for hidden blood in the stool. Use of a small camera at the end of a tube, to directly examine the colon (sigmoidoscopy or colonoscopy), can detect the earliest forms of colorectal cancer. Talk to your caregiver about this at age 50, when routine screening begins. Direct examination of the colon should be repeated every 5 to 10 years through age 75, unless early forms of pre-cancerous polyps or small growths are found.   Hepatitis C blood testing is recommended for all people born from 1945 through 1965 and any individual with known risks for hepatitis C.   Practice safe sex. Use condoms and avoid high-risk sexual practices to reduce the spread of sexually transmitted infections (STIs). STIs include gonorrhea, chlamydia, syphilis, trichomonas, herpes, HPV, and human immunodeficiency virus (HIV). Herpes, HIV, and HPV are viral illnesses that have no cure. They can result in disability, cancer, and death.   A one-time screening for abdominal aortic aneurysm (AAA) and surgical repair of large AAAs by sound wave imaging (ultrasonography) is recommended for ages 65 to 75 years who are current or former smokers.   Healthy men should no longer receive prostate-specific antigen (PSA) blood tests as part of routine cancer screening. Consult with your caregiver about prostate cancer screening.   Testicular cancer screening is not recommended for adult males who have no symptoms. Screening includes self-exam, caregiver exam, and other screening tests. Consult with your caregiver about any symptoms you have or any concerns you have about testicular cancer.   Use sunscreen with skin protection factor (SPF) of 30 or more. Apply sunscreen liberally and repeatedly throughout the day. You should seek shade when your shadow is shorter than you. Protect yourself by wearing long sleeves, pants, a  wide-brimmed hat, and sunglasses year round, whenever you are outdoors.   Once a month, do a whole body skin exam, using a mirror to look at the skin on your back. Notify your caregiver of new moles, moles that have irregular borders, moles that are larger than a pencil eraser, or moles that have changed in shape or color.   Stay current with required immunizations.   Influenza. You need a dose every fall (or winter). The composition of the flu vaccine changes each year, so being vaccinated once is not enough.   Pneumococcal polysaccharide. You need 1 to 2 doses if you smoke cigarettes or if you have certain chronic medical conditions. You need 1 dose at age 65 (or older) if you have never been vaccinated.   Tetanus, diphtheria, pertussis (Tdap, Td). Get 1 dose of Tdap vaccine if you are younger than age 65 years, are over 65 and have contact with an infant, are a healthcare worker, or simply want to be protected from whooping cough. After that, you need a Td booster dose every 10 years. Consult your caregiver if   you have not had at least 3 tetanus and diphtheria-containing shots sometime in your life or have a deep or dirty wound.   HPV. This vaccine is recommended for males 13 through 62 years of age. This vaccine may be given to men 22 through 62 years of age who have not completed the 3 dose series. It is recommended for men through age 26 who have sex with men or whose immune system is weakened because of HIV infection, other illness, or medications. The vaccine is given in 3 doses over 6 months.   Measles, mumps, rubella (MMR). You need at least 1 dose of MMR if you were born in 1957 or later. You may also need a 2nd dose.   Meningococcal. If you are age 19 to 21 years and a first-year college student living in a residence hall, or have one of several medical conditions, you need to get vaccinated against meningococcal disease. You may also need additional booster doses.   Zoster (shingles).  If you are age 60 years or older, you should get this vaccine.   Varicella (chickenpox). If you have never had chickenpox or you were vaccinated but received only 1 dose, talk to your caregiver to find out if you need this vaccine.   Hepatitis A. You need this vaccine if you have a specific risk factor for hepatitis A virus infection, or you simply wish to be protected from this disease. The vaccine is usually given as 2 doses, 6 to 18 months apart.   Hepatitis B. You need this vaccine if you have a specific risk factor for hepatitis B virus infection or you simply wish to be protected from this disease. The vaccine is given in 3 doses, usually over 6 months.  Preventative Service / Frequency Ages 19 to 39  Blood pressure check.** / Every 1 to 2 years.   Lipid and cholesterol check.** / Every 5 years beginning at age 20.   Hepatitis C blood test.** / For any individual with known risks for hepatitis C.   Skin self-exam. / Monthly.   Influenza immunization.** / Every year.   Pneumococcal polysaccharide immunization.** / 1 to 2 doses if you smoke cigarettes or if you have certain chronic medical conditions.   Tetanus, diphtheria, pertussis (Tdap,Td) immunization. / A one-time dose of Tdap vaccine. After that, you need a Td booster dose every 10 years.   HPV immunization. / 3 doses over 6 months, if 26 and younger.   Measles, mumps, rubella (MMR) immunization. / You need at least 1 dose of MMR if you were born in 1957 or later. You may also need a 2nd dose.   Meningococcal immunization. / 1 dose if you are age 19 to 21 years and a first-year college student living in a residence hall, or have one of several medical conditions, you need to get vaccinated against meningococcal disease. You may also need additional booster doses.   Varicella immunization.** / Consult your caregiver.   Hepatitis A immunization.** / Consult your caregiver. 2 doses, 6 to 18 months apart.   Hepatitis B  immunization.** / Consult your caregiver. 3 doses usually over 6 months.  Ages 40 to 64  Blood pressure check.** / Every 1 to 2 years.   Lipid and cholesterol check.** / Every 5 years beginning at age 20.   Fecal occult blood test (FOBT) of stool. / Every year beginning at age 50 and continuing until age 75. You may not have to do this test if   you get colonoscopy every 10 years.   Flexible sigmoidoscopy** or colonoscopy.** / Every 5 years for a flexible sigmoidoscopy or every 10 years for a colonoscopy beginning at age 50 and continuing until age 27.   Hepatitis C blood test.** / For all people born from [birth year] through 1965 and any individual with known risks for hepatitis C.   Skin self-exam. / Monthly.   Influenza immunization.** / Every year.   Pneumococcal polysaccharide immunization.** / 1 to 2 doses if you smoke cigarettes or if you have certain chronic medical conditions.   Tetanus, diphtheria, pertussis (Tdap/Td) immunization.** / A one-time dose of Tdap vaccine. After that, you need a Td booster dose every 10 years.   Measles, mumps, rubella (MMR) immunization. / You need at least 1 dose of MMR if you were born in 1957 or later. You may also need a 2nd dose.   Varicella immunization.**/ Consult your caregiver.   Meningococcal immunization.** / Consult your caregiver.   Hepatitis A immunization.** / Consult your caregiver. 2 doses, 6 to 18 months apart.   Hepatitis B immunization.** / Consult your caregiver. 3 doses, usually over 6 months.  Ages 23 and over  Blood pressure check.** / Every 1 to 2 years.   Lipid and cholesterol check.**/ Every 5 years beginning at age 32.   Fecal occult blood test (FOBT) of stool. / Every year beginning at age 42 and continuing until age 74. You may not have to do this test if you get colonoscopy every 10 years.   Flexible sigmoidoscopy** or colonoscopy.** / Every 5 years for a flexible sigmoidoscopy or every 10 years for a colonoscopy  beginning at age 72 and continuing until age 38.   Hepatitis C blood test.** / For all people born from [birth year] through 1965 and any individual with known risks for hepatitis C.   Abdominal aortic aneurysm (AAA) screening.** / A one-time screening for ages 33 to 44 years who are current or former smokers.   Skin self-exam. / Monthly.   Influenza immunization.** / Every year.   Pneumococcal polysaccharide immunization.** / 1 dose at age 71 (or older) if you have never been vaccinated.   Tetanus, diphtheria, pertussis (Tdap, Td) immunization. / A one-time dose of Tdap vaccine if you are over 65 and have contact with an infant, are a Research scientist (physical sciences), or simply want to be protected from whooping cough. After that, you need a Td booster dose every 10 years.   Varicella immunization. ** / Consult your caregiver.   Meningococcal immunization.** / Consult your caregiver.   Hepatitis A immunization. ** / Consult your caregiver. 2 doses, 6 to 18 months apart.   Hepatitis B immunization.** / Check with your caregiver. 3 doses, usually over 6 months.  **Family history and personal history of risk and conditions may change your caregiver's recommendations. Document Released: 02/22/2001 Document Revised: 12/16/2010 Document Reviewed: 05/24/2010 [facility] Patient Information 2012 [place], [place].   Call about Zostavax for shingles Consider a daily probiotic such as Align 1 cap po daily

## 2011-09-06 NOTE — Assessment & Plan Note (Signed)
Sugar  elevated but hgba1c is OK, avoid simple carbs and increase exercise

## 2011-09-06 NOTE — Assessment & Plan Note (Signed)
Multifactorial, patient acknowledges he has a cpap machine he got in [place] but he has not been using it, he agrees to start using it again and may use Ambien prn as well

## 2011-09-06 NOTE — Assessment & Plan Note (Signed)
Avoid trans fats, increase exercise, add a fatty acid supplement, avoid simple carbs.

## 2011-09-06 NOTE — Assessment & Plan Note (Signed)
Agrees to start trying his CPAP machine and he agrees to supply [place] with the name of the company that supplied it.

## 2011-09-06 NOTE — Assessment & Plan Note (Signed)
Mild elevation, minimize sodium but no change in medications

## 2011-09-06 NOTE — Assessment & Plan Note (Signed)
Encouraged adequate sleep, heart healthy diet, regular exercise

## 2011-09-06 NOTE — Assessment & Plan Note (Signed)
Patient continues to live away from his wife who had to stay behind in [place] and his work is very stressful.Given a prescription for Ativan 1 mg to use prn which has used before. He has been advised that he needs to use it infrequently or it can become habituating, if he finds he needs it regularly we need to consider a daily SSRI

## 2011-09-06 NOTE — Assessment & Plan Note (Signed)
Given DASH diet handout and encouraged to increase physical activity and decrease po intake

## 2011-09-06 NOTE — Assessment & Plan Note (Addendum)
Persistent but has had a complete urologic work up. Needs decreased fluid intake in evenings.

## 2011-09-07 LAB — HEPATITIS PANEL, ACUTE
HCV Ab: NEGATIVE
Hep A IgM: NEGATIVE
Hep B C IgM: NEGATIVE
Hepatitis B Surface Ag: NEGATIVE

## 2011-09-07 NOTE — Progress Notes (Signed)
Quick Note:  Patient Informed and voiced understanding ______ 

## 2011-12-21 ENCOUNTER — Other Ambulatory Visit: Payer: Self-pay | Admitting: Family Medicine

## 2011-12-21 MED ORDER — TESTOSTERONE 30 MG/ACT TD SOLN: 1.0000 | Freq: Every day | TRANSDERMAL | Status: DC

## 2011-12-21 NOTE — Telephone Encounter (Signed)
Please advise Axiron refill? Last RX was wrote on 08-30-11 quantity 90 with 0 refills.  If ok fax to [phone]

## 2011-12-22 NOTE — Telephone Encounter (Signed)
RX faxed

## 2012-02-21 ENCOUNTER — Telehealth: Payer: Self-pay

## 2012-02-21 ENCOUNTER — Other Ambulatory Visit (INDEPENDENT_AMBULATORY_CARE_PROVIDER_SITE_OTHER): Payer: PRIVATE HEALTH INSURANCE

## 2012-02-21 DIAGNOSIS — E785 Hyperlipidemia, unspecified: Secondary | ICD-10-CM

## 2012-02-21 DIAGNOSIS — I10 Essential (primary) hypertension: Secondary | ICD-10-CM

## 2012-02-21 DIAGNOSIS — R7309 Other abnormal glucose: Secondary | ICD-10-CM

## 2012-02-21 DIAGNOSIS — R739 Hyperglycemia, unspecified: Secondary | ICD-10-CM

## 2012-02-21 DIAGNOSIS — R319 Hematuria, unspecified: Secondary | ICD-10-CM

## 2012-02-21 LAB — POCT URINALYSIS DIPSTICK
Spec Grav, UA: >=1.03
Urobilinogen, UA: 0.2
pH, UA: 5.5

## 2012-02-21 LAB — HEMOGLOBIN A1C: Hgb A1c MFr Bld: 5.8 % (ref 4.6–6.5)

## 2012-02-21 LAB — RENAL FUNCTION PANEL
BUN: 21 mg/dL (ref 6–23)
CO2: 26 mEq/L (ref 19–32)
Creatinine, Ser: 1.2 mg/dL (ref 0.4–1.5)
GFR: 65.05 mL/min (ref >60.00)
Phosphorus: 2.8 mg/dL (ref 2.3–4.6)

## 2012-02-21 LAB — HEPATIC FUNCTION PANEL
ALT: 64 U/L — ABNORMAL HIGH (ref 0–53)
Albumin: 4.2 g/dL (ref 3.5–5.2)
Bilirubin, Direct: 0.2 mg/dL (ref 0.0–0.3)
Total Bilirubin: 1.1 mg/dL (ref 0.3–1.2)
Total Protein: 7.3 g/dL (ref 6.0–8.3)

## 2012-02-21 LAB — LIPID PANEL
LDL Cholesterol: 106 mg/dL — ABNORMAL HIGH (ref 0–99)
Total CHOL/HDL Ratio: 5
VLDL: 31.2 mg/dL (ref 0.0–40.0)

## 2012-02-21 LAB — CBC
MCHC: 33.4 g/dL (ref 30.0–36.0)
MCV: 95.7 fl (ref 78.0–100.0)
Platelets: 234 10*3/uL (ref 150.0–400.0)

## 2012-02-21 NOTE — Telephone Encounter (Signed)
[facility] called with a critical hemoglobin at 18.2

## 2012-02-21 NOTE — Telephone Encounter (Signed)
Noted only a mild increase from last blood draw will discuss options with patient at visit next week

## 2012-02-21 NOTE — Progress Notes (Signed)
Quick Note:  Patient Informed and voiced understanding.  UA ordered ______

## 2012-02-22 LAB — MICROALBUMIN, URINE: Microalb, Ur: 1.55 mg/dL (ref 0.00–1.89)

## 2012-02-28 ENCOUNTER — Encounter: Payer: Self-pay | Admitting: Family Medicine

## 2012-02-28 ENCOUNTER — Ambulatory Visit (INDEPENDENT_AMBULATORY_CARE_PROVIDER_SITE_OTHER): Payer: PRIVATE HEALTH INSURANCE | Admitting: Family Medicine

## 2012-02-28 ENCOUNTER — Ambulatory Visit: Payer: PRIVATE HEALTH INSURANCE | Admitting: Family Medicine

## 2012-02-28 VITALS — BP 132/82 | HR 102 | Temp 98.0°F | Ht 67.0 in | Wt 251.0 lb

## 2012-02-28 DIAGNOSIS — E559 Vitamin D deficiency, unspecified: Secondary | ICD-10-CM | POA: Insufficient documentation

## 2012-02-28 DIAGNOSIS — R7309 Other abnormal glucose: Secondary | ICD-10-CM

## 2012-02-28 DIAGNOSIS — M25569 Pain in unspecified knee: Secondary | ICD-10-CM | POA: Insufficient documentation

## 2012-02-28 DIAGNOSIS — G4730 Sleep apnea, unspecified: Secondary | ICD-10-CM

## 2012-02-28 DIAGNOSIS — E291 Testicular hypofunction: Secondary | ICD-10-CM

## 2012-02-28 DIAGNOSIS — G4700 Insomnia, unspecified: Secondary | ICD-10-CM

## 2012-02-28 DIAGNOSIS — E669 Obesity, unspecified: Secondary | ICD-10-CM

## 2012-02-28 DIAGNOSIS — R945 Abnormal results of liver function studies: Secondary | ICD-10-CM

## 2012-02-28 DIAGNOSIS — R7989 Other specified abnormal findings of blood chemistry: Secondary | ICD-10-CM | POA: Insufficient documentation

## 2012-02-28 DIAGNOSIS — R739 Hyperglycemia, unspecified: Secondary | ICD-10-CM

## 2012-02-28 DIAGNOSIS — E785 Hyperlipidemia, unspecified: Secondary | ICD-10-CM

## 2012-02-28 DIAGNOSIS — I10 Essential (primary) hypertension: Secondary | ICD-10-CM

## 2012-02-28 HISTORY — DX: Vitamin D deficiency, unspecified: E55.9

## 2012-02-28 HISTORY — DX: Other specified abnormal findings of blood chemistry: R79.89

## 2012-02-28 HISTORY — DX: Pain in unspecified knee: M25.569

## 2012-02-28 MED ORDER — ALPRAZOLAM 1 MG PO TABS: 1.0000 mg | ORAL_TABLET | Freq: Two times a day (BID) | ORAL | Status: DC | PRN

## 2012-02-28 MED ORDER — ZOLPIDEM TARTRATE 10 MG PO TABS: 10.0000 mg | ORAL_TABLET | Freq: Every evening | ORAL | Status: DC | PRN

## 2012-02-28 NOTE — Patient Instructions (Addendum)
Vitamin d 1000iu daily once normalized    Next visit annual with labs prior to include, psa, hgba1c, lipid, renal, hepatic, tsh, vitamin d, uric acid, cbc  Sleep Apnea  Sleep apnea is a sleep disorder characterized by abnormal pauses in breathing while you sleep. When your breathing pauses, the level of oxygen in your blood decreases. This causes you to move out of deep sleep and into light sleep. As a result, your quality of sleep is poor, and the system that carries your blood throughout your body (cardiovascular system) experiences stress. If sleep apnea remains untreated, the following conditions can develop:  High blood pressure (hypertension).  Coronary artery disease.  Inability to achieve or maintain an erection (impotence).  Impairment of your thought process (cognitive dysfunction). There are three types of sleep apnea: 1. Obstructive sleep apnea Pauses in breathing during sleep because of a blocked airway. 2. Central sleep apnea Pauses in breathing during sleep because the area of the brain that controls your breathing does not send the correct signals to the muscles that control breathing. 3. Mixed sleep apnea A combination of both obstructive and central sleep apnea. RISK FACTORS The following risk factors can increase your risk of developing sleep apnea:  Being overweight.  Smoking.  Having narrow passages in your nose and throat.  Being of older age.  Being male.  Alcohol use.  Sedative and tranquilizer use.  Ethnicity. Among individuals younger than 35 years, African Americans are at increased risk of sleep apnea. SYMPTOMS   Difficulty staying asleep.  Daytime sleepiness and fatigue.  Loss of energy.  Irritability.  Loud, heavy snoring.  Morning headaches.  Trouble concentrating.  Forgetfulness.  Decreased interest in sex. DIAGNOSIS  In order to diagnose sleep apnea, your caregiver will perform a physical examination. Your caregiver may  suggest that you take a home sleep test. Your caregiver may also recommend that you spend the night in a sleep lab. In the sleep lab, several monitors record information about your heart, lungs, and brain while you sleep. Your leg and arm movements and blood oxygen level are also recorded. TREATMENT The following actions may help to resolve mild sleep apnea:  Sleeping on your side.   Using a decongestant if you have nasal congestion.   Avoiding the use of depressants, including alcohol, sedatives, and narcotics.   Losing weight and modifying your diet if you are overweight. There also are devices and treatments to help open your airway:  Oral appliances. These are custom-made mouthpieces that shift your lower jaw forward and slightly open your bite. This opens your airway.  Devices that create positive airway pressure. This positive pressure "splints" your airway open to help you breathe better during sleep. The following devices create positive airway pressure:  Continuous positive airway pressure (CPAP) device. The CPAP device creates a continuous level of air pressure with an air pump. The air is delivered to your airway through a mask while you sleep. This continuous pressure keeps your airway open.  Nasal expiratory positive airway pressure (EPAP) device. The EPAP device creates positive air pressure as you exhale. The device consists of single-use valves, which are inserted into each nostril and held in place by adhesive. The valves create very little resistance when you inhale but create much more resistance when you exhale. That increased resistance creates the positive airway pressure. This positive pressure while you exhale keeps your airway open, making it easier to breath when you inhale again.  Bilevel positive airway pressure (BPAP) device.  The BPAP device is used mainly in patients with central sleep apnea. This device is similar to the CPAP device because it also uses an air  pump to deliver continuous air pressure through a mask. However, with the BPAP machine, the pressure is set at two different levels. The pressure when you exhale is lower than the pressure when you inhale.  Surgery. Typically, surgery is only done if you cannot comply with less invasive treatments or if the less invasive treatments do not improve your condition. Surgery involves removing excess tissue in your airway to create a wider passage way. Document Released: 12/17/2001 Document Revised: 06/28/2011 Document Reviewed: 05/05/2011 [facility] Patient Information 2013 [place], [place].
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
## 2012-02-28 NOTE — Assessment & Plan Note (Signed)
Encouraged minimize simple carbs and avoid trans fats, increase exercise and start DASH diet.

## 2012-02-28 NOTE — Assessment & Plan Note (Signed)
Avoid trans fats, encouraged krill oil caps. Avoid trans fats increase exercise. Consider meds with next blood draw if not improved.

## 2012-02-28 NOTE — Assessment & Plan Note (Signed)
On 50000Iu weekly for now prescribed by his wellness center at work they are going to recheck.

## 2012-02-28 NOTE — Assessment & Plan Note (Signed)
Patient is noncompliant with  CPAP, agrees to pull out his machine and try and use it again. If he is unsuccessful will consider letting us repeat the sleep study. Educated at length about the serious consequences of not treating including MI, stroke, CHF and dementia.

## 2012-02-28 NOTE — Assessment & Plan Note (Signed)
Improved today but recent episode of swelling and pain with increased use. Feels different from previous gout attacks. encouraged ice and aspercreme if recurs. Consider referralt o ortho as needed

## 2012-02-28 NOTE — Assessment & Plan Note (Signed)
Stable, acute hepatitis panel negative, likely fatty liver disease. Encouraged avoid trans fats, minimize simple carbs and saturated fats. Attempt modest weight loss

## 2012-02-28 NOTE — Assessment & Plan Note (Signed)
Minimize simple carbs, increase exercise 

## 2012-02-28 NOTE — Assessment & Plan Note (Signed)
Adequately controlled, encouraged to minimize sodium and caffeine. Continue current meds.

## 2012-02-28 NOTE — Assessment & Plan Note (Signed)
Tolerating Axiron. Good response testosterone level now just below normal. NO changes today.

## 2012-08-28 ENCOUNTER — Telehealth: Payer: Self-pay | Admitting: *Deleted

## 2012-08-28 ENCOUNTER — Encounter: Payer: PRIVATE HEALTH INSURANCE | Admitting: Family Medicine

## 2012-08-28 DIAGNOSIS — G4700 Insomnia, unspecified: Secondary | ICD-10-CM

## 2012-08-28 MED ORDER — ZOLPIDEM TARTRATE 10 MG PO TABS: 10.0000 mg | ORAL_TABLET | Freq: Every evening | ORAL | Status: DC | PRN

## 2012-08-28 NOTE — Telephone Encounter (Signed)
Rx request phoned to pharmacy/SLS  

## 2012-08-28 NOTE — Telephone Encounter (Signed)
Faxed refill request received from pharmacy for Zolpidem Last filled by MD on 02.18.14, #[id number] Last AEX - 02.18.14 Next AEX - 6 Months [appt 08.26.14] Please Advise/SLS

## 2012-08-28 NOTE — Telephone Encounter (Signed)
OK to send #[phone] with zero refills.  

## 2012-09-04 ENCOUNTER — Encounter: Payer: Self-pay | Admitting: Family Medicine

## 2012-09-04 ENCOUNTER — Ambulatory Visit (INDEPENDENT_AMBULATORY_CARE_PROVIDER_SITE_OTHER): Payer: PRIVATE HEALTH INSURANCE | Admitting: Family Medicine

## 2012-09-04 VITALS — BP 146/94 | HR 84 | Temp 97.8°F | Ht 67.0 in | Wt 260.0 lb

## 2012-09-04 DIAGNOSIS — R7309 Other abnormal glucose: Secondary | ICD-10-CM

## 2012-09-04 DIAGNOSIS — E119 Type 2 diabetes mellitus without complications: Secondary | ICD-10-CM

## 2012-09-04 DIAGNOSIS — F4320 Adjustment disorder, unspecified: Secondary | ICD-10-CM

## 2012-09-04 DIAGNOSIS — G4730 Sleep apnea, unspecified: Secondary | ICD-10-CM

## 2012-09-04 DIAGNOSIS — E785 Hyperlipidemia, unspecified: Secondary | ICD-10-CM

## 2012-09-04 DIAGNOSIS — Z0000 Encounter for general adult medical examination without abnormal findings: Secondary | ICD-10-CM

## 2012-09-04 DIAGNOSIS — G4700 Insomnia, unspecified: Secondary | ICD-10-CM

## 2012-09-04 DIAGNOSIS — E669 Obesity, unspecified: Secondary | ICD-10-CM

## 2012-09-04 DIAGNOSIS — R739 Hyperglycemia, unspecified: Secondary | ICD-10-CM

## 2012-09-04 DIAGNOSIS — R7989 Other specified abnormal findings of blood chemistry: Secondary | ICD-10-CM

## 2012-09-04 DIAGNOSIS — E559 Vitamin D deficiency, unspecified: Secondary | ICD-10-CM

## 2012-09-04 DIAGNOSIS — F4321 Adjustment disorder with depressed mood: Secondary | ICD-10-CM

## 2012-09-04 DIAGNOSIS — M109 Gout, unspecified: Secondary | ICD-10-CM

## 2012-09-04 DIAGNOSIS — E291 Testicular hypofunction: Secondary | ICD-10-CM

## 2012-09-04 DIAGNOSIS — I10 Essential (primary) hypertension: Secondary | ICD-10-CM

## 2012-09-04 MED ORDER — ZOLPIDEM TARTRATE 10 MG PO TABS: 10.0000 mg | ORAL_TABLET | Freq: Every evening | ORAL | Status: DC | PRN

## 2012-09-04 MED ORDER — LOSARTAN POTASSIUM-HCTZ 100-25 MG PO TABS: 1.0000 | ORAL_TABLET | Freq: Every day | ORAL | Status: DC

## 2012-09-04 NOTE — Patient Instructions (Addendum)
DASH Diet  The DASH diet stands for "Dietary Approaches to Stop Hypertension." It is a healthy eating plan that has been shown to reduce high blood pressure (hypertension) in as little as 14 days, while also possibly providing other significant health benefits. These other health benefits include reducing the risk of breast cancer after menopause and reducing the risk of type 2 diabetes, heart disease, colon cancer, and stroke. Health benefits also include weight loss and slowing kidney failure in patients with chronic kidney disease.   DIET GUIDELINES  · Limit salt (sodium). Your diet should contain less than 1500 mg of sodium daily.  · Limit refined or processed carbohydrates. Your diet should include mostly whole grains. Desserts and added sugars should be used sparingly.  · Include small amounts of heart-healthy fats. These types of fats include nuts, oils, and tub margarine. Limit saturated and trans fats. These fats have been shown to be harmful in the body.  CHOOSING FOODS   The following food groups are based on a 2000 calorie diet. See your Registered Dietitian for individual calorie needs.  Grains and Grain Products (6 to 8 servings daily)  · Eat More Often: Whole-wheat bread, brown rice, whole-grain or wheat pasta, quinoa, popcorn without added fat or salt (air popped).  · Eat Less Often: White bread, white pasta, white rice, cornbread.  Vegetables (4 to 5 servings daily)  · Eat More Often: Fresh, frozen, and canned vegetables. Vegetables may be raw, steamed, roasted, or grilled with a minimal amount of fat.  · Eat Less Often/Avoid: Creamed or fried vegetables. Vegetables in a cheese sauce.  Fruit (4 to 5 servings daily)  · Eat More Often: All fresh, canned (in natural juice), or frozen fruits. Dried fruits without added sugar. One hundred percent fruit juice (½ cup [237 mL] daily).  · Eat Less Often: Dried fruits with added sugar. Canned fruit in light or heavy syrup.  Lean Meats, Fish, and Poultry (2  servings or less daily. One serving is 3 to 4 oz [85-114 g]).  · Eat More Often: Ninety percent or leaner ground beef, tenderloin, sirloin. Round cuts of beef, chicken breast, turkey breast. All fish. Grill, bake, or broil your meat. Nothing should be fried.  · Eat Less Often/Avoid: Fatty cuts of meat, turkey, or chicken leg, thigh, or wing. Fried cuts of meat or fish.  Dairy (2 to 3 servings)  · Eat More Often: Low-fat or fat-free milk, low-fat plain or light yogurt, reduced-fat or part-skim cheese.  · Eat Less Often/Avoid: Milk (whole, 2%). Whole milk yogurt. Full-fat cheeses.  Nuts, Seeds, and Legumes (4 to 5 servings per week)  · Eat More Often: All without added salt.  · Eat Less Often/Avoid: Salted nuts and seeds, canned beans with added salt.  Fats and Sweets (limited)  · Eat More Often: Vegetable oils, tub margarines without trans fats, sugar-free gelatin. Mayonnaise and salad dressings.  · Eat Less Often/Avoid: Coconut oils, palm oils, butter, stick margarine, cream, half and half, cookies, candy, pie.  FOR MORE INFORMATION  The Dash Diet Eating Plan: www.dashdiet.org  Document Released: 12/16/2010 Document Revised: 03/21/2011 Document Reviewed: 12/16/2010  [employer]® Patient Information ©2014 [employer].

## 2012-09-09 ENCOUNTER — Encounter: Payer: Self-pay | Admitting: Family Medicine

## 2012-09-09 DIAGNOSIS — F4320 Adjustment disorder, unspecified: Secondary | ICD-10-CM

## 2012-09-09 DIAGNOSIS — F4321 Adjustment disorder with depressed mood: Secondary | ICD-10-CM

## 2012-09-09 HISTORY — DX: Adjustment disorder with depressed mood: F43.21

## 2012-09-09 HISTORY — DX: Adjustment disorder, unspecified: F43.20

## 2012-09-09 NOTE — Assessment & Plan Note (Signed)
Continue Axiron

## 2012-09-09 NOTE — Assessment & Plan Note (Signed)
Repeat hgba1c minimize simple carbs.

## 2012-09-09 NOTE — Assessment & Plan Note (Signed)
Spent roughly 10 minutes trying to onvince patient to accept referral for treatment and new study, he declines but will call if he decides to proceed

## 2012-09-09 NOTE — Assessment & Plan Note (Signed)
Encouraged DASH diet. increase activity as tolerated.

## 2012-09-09 NOTE — Assessment & Plan Note (Signed)
Encouraged DASH diet, increase exercise, get adequate sleep. Let us know if he becomes overwhelmed and excretion. Encouraged to proceed with repeat sleep study

## 2012-09-09 NOTE — Assessment & Plan Note (Signed)
Improved on repeat. Refill Hyzaar

## 2012-09-09 NOTE — Assessment & Plan Note (Signed)
Check lipids, avoid trans fats.
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
## 2012-09-09 NOTE — Assessment & Plan Note (Signed)
Responds to Ambien

## 2012-09-09 NOTE — Assessment & Plan Note (Signed)
Repeat level

## 2012-09-12 LAB — RENAL FUNCTION PANEL
BUN: 20 mg/dL (ref 6–23)
Calcium: 9.8 mg/dL (ref 8.4–10.5)
Creat: 1.12 mg/dL (ref 0.50–1.35)
Phosphorus: 3.5 mg/dL (ref 2.3–4.6)

## 2012-09-12 LAB — LIPID PANEL: Total CHOL/HDL Ratio: 4.1 Ratio

## 2012-09-12 LAB — HEPATIC FUNCTION PANEL
Albumin: 4.9 g/dL (ref 3.5–5.2)
Alkaline Phosphatase: 67 U/L (ref 39–117)
Total Protein: 7.2 g/dL (ref 6.0–8.3)

## 2012-09-12 LAB — VITAMIN D 25 HYDROXY (VIT D DEFICIENCY, FRACTURES): Vit D, 25-Hydroxy: 26 ng/mL — ABNORMAL LOW (ref 30–89)

## 2012-09-12 LAB — HEMOGLOBIN A1C: Mean Plasma Glucose: 126 mg/dL — ABNORMAL HIGH (ref <117)

## 2012-09-12 LAB — PSA: PSA: 0.5 ng/mL (ref <=4.00)

## 2012-09-19 ENCOUNTER — Telehealth: Payer: Self-pay | Admitting: *Deleted

## 2012-09-19 MED ORDER — ALLOPURINOL 100 MG PO TABS: 100.0000 mg | ORAL_TABLET | Freq: Every day | ORAL | Status: DC

## 2012-09-19 NOTE — Telephone Encounter (Signed)
Message copied by Marlene Lard on Wed Sep 19, 2012  9:59 AM ------      Message from: Danise Edge A      Created: Wed Sep 12, 2012  6:54 PM       Notify sugar, prostate, testosterone, etc all good. Cholesterol up slightly avoid trans fats, exercise and take krill oil for this. Uric acid up should take Allopurinol 100 mg po daily #30 with 3 rf. Vitamin d slightly low. Start  Vitamin D 2000IU qd otc ------

## 2013-01-28 ENCOUNTER — Encounter: Payer: Self-pay | Admitting: Physician Assistant

## 2013-01-28 ENCOUNTER — Ambulatory Visit (INDEPENDENT_AMBULATORY_CARE_PROVIDER_SITE_OTHER): Payer: PRIVATE HEALTH INSURANCE | Admitting: Physician Assistant

## 2013-01-28 VITALS — BP 136/86 | HR 72 | Temp 98.1°F | Resp 16 | Ht 67.0 in | Wt 264.2 lb

## 2013-01-28 DIAGNOSIS — H6240 Otitis externa in other diseases classified elsewhere, unspecified ear: Principal | ICD-10-CM

## 2013-01-28 DIAGNOSIS — B369 Superficial mycosis, unspecified: Secondary | ICD-10-CM

## 2013-01-28 DIAGNOSIS — B49 Unspecified mycosis: Secondary | ICD-10-CM

## 2013-01-28 DIAGNOSIS — H6980 Other specified disorders of Eustachian tube, unspecified ear: Secondary | ICD-10-CM

## 2013-01-28 MED ORDER — HYDROCORTISONE-ACETIC ACID 1-2 % OT SOLN: OTIC | Status: DC

## 2013-01-28 MED ORDER — FLUTICASONE PROPIONATE 50 MCG/ACT NA SUSP: 2.0000 | Freq: Every day | NASAL | Status: DC

## 2013-01-28 NOTE — Progress Notes (Signed)
Pre visit review using our clinic review tool, if applicable. No additional management support is needed unless otherwise documented below in the visit note/SLS  

## 2013-01-28 NOTE — Progress Notes (Signed)
Subjective:     Patient ID: JESSEE MEZERA, male   DOB: 1950-12-20, 63 y.o.   MRN: [id number]   CC: ear fullness  HPI   Patient presented to clinic complaining of bilateral ear fullness with decrease in hearing. Patient had recently been treated for acute bronchitis with azithromycin. Patient endorses symptom resolution. Patient also been seen at a health clinic at his work, was diagnosed with otitis media and given prescription for amoxicillin and Ciprodex. Patient states he has noticed no improvement in symptoms with the amoxicillin and Ciprodex. Sensation of ear fullness has worsened. Patient denies tinnitus. Does endorse drainage from his ears bilaterally. Denies fever, sinus pressure or sinus pain. Patient also complains of a sensation of the ears "popping" over the past week. Patient denies history of seasonal allergies or eustachian tube dysfunction. Patient states the popping sensation has been present longer than ear fullness.  Review of Systems  Constitutional: Negative for fever, chills and fatigue.  HENT: Positive for congestion, ear pain, hearing loss, postnasal drip, sneezing and sore throat. Negative for dental problem, ear discharge, rhinorrhea, sinus pressure and tinnitus.   Eyes: Negative for pain, discharge and itching.  Respiratory: Positive for cough and wheezing. Negative for chest tightness and shortness of breath.   Neurological: Negative for light-headedness.       Objective:   Physical Exam  Constitutional: He is oriented to person, place, and time. Vital signs are normal. He appears well-developed and well-nourished.  HENT:  Head: Normocephalic.  Right Ear: External ear normal. No drainage or tenderness. Decreased hearing is noted.  Left Ear: External ear normal. No drainage or tenderness. Decreased hearing is noted.  Ears:  Nose: Nose normal.  Eyes: Pupils are equal, round, and reactive to light. Right eye exhibits no discharge. Left eye exhibits no discharge.   Neck: No tracheal deviation present. No thyromegaly present.  Cardiovascular: Normal rate, regular rhythm and normal heart sounds.  Exam reveals no gallop and no friction rub.   No murmur heard. Pulmonary/Chest: Effort normal and breath sounds normal. He has no wheezes. He has no rales.  Lymphadenopathy:       Head (right side): No submental, no submandibular, no tonsillar, no preauricular and no posterior auricular adenopathy present.       Head (left side): No submental, no submandibular, no tonsillar, no preauricular and no posterior auricular adenopathy present.    He has no cervical adenopathy.  Neurological: He is alert and oriented to person, place, and time.  Skin: Skin is warm and dry. No rash noted.  Psychiatric: He has a normal mood and affect. His behavior is normal. Judgment and thought content normal.   Filed Vitals:   01/28/13 1326  BP: 136/86  Pulse: 72  Temp: 98.1 F (36.7 C)  Resp: 16       Assessment:     1. Eustachian tube dysfunction 2. Fungal otitis externa     Plan:     Pharm: Rx:      1. Vosol otic drops x 1 week for fungal otitis externa  2. flonase prn for nasal symptoms  3. Discontinue nasal spray from employee pharmacy. OTC:   1. Claritin daily until symptoms resolve.  Pt Education: Pt was educated about fungal ear infections. Referral: ENT if worse or no improvement in 1 week. Follow-up: Return to office in 1 week to re-evaluate ears.  01/28/2013  Martinique Hausladen, PA-S.   I have personally seen and evaluated the patient. Discussed the patient's concerns with the  student. Examination revealed a fungal mass obstructing the ear canals bilaterally. Ears were irrigated to remove the majority of the mass. Discussed with the student that the reason the Ciprodex is not working, is because the patient has an otitis externa that is fungal in origin and requires a different medication for improvement. Rx VoSol otic drops. Patient instructed to place  cotton gauze in his ears are showering. It is very important that no other fluids in his ear besides the VoSol drops. Patient also with likely eustachian tube dysfunction. Rx Flonase. Patient instructed to take a Claritin daily. Patient to return in one week for reevaluation. If symptoms resistant to treatment, will need referral to ENT.    Brunetta Jeans, PA-C

## 2013-01-28 NOTE — Patient Instructions (Signed)
Please use Vosol drops as prescribed for 1 week.  Use flonase as directed and take a daily claritin.  Place cotton in ears when showering.  Return in 1 week for re-evaluation.  Return sooner if you need [place].  Otitis Externa Otitis externa is a bacterial or fungal infection of the outer ear canal. This is the area from the eardrum to the outside of the ear. Otitis externa is sometimes called "swimmer's ear." CAUSES  Possible causes of infection include:  Swimming in dirty water.  Moisture remaining in the ear after swimming or bathing.  Mild injury (trauma) to the ear.  Objects stuck in the ear (foreign body).  Cuts or scrapes (abrasions) on the outside of the ear. SYMPTOMS  The first symptom of infection is often itching in the ear canal. Later signs and symptoms may include swelling and redness of the ear canal, ear pain, and yellowish-white fluid (pus) coming from the ear. The ear pain may be worse when pulling on the earlobe. DIAGNOSIS  Your caregiver will perform a physical exam. A sample of fluid may be taken from the ear and examined for bacteria or fungi. TREATMENT  Antibiotic ear drops are often given for 10 to 14 days. Treatment may also include pain medicine or corticosteroids to reduce itching and swelling. PREVENTION   Keep your ear dry. Use the corner of a towel to absorb water out of the ear canal after swimming or bathing.  Avoid scratching or putting objects inside your ear. This can damage the ear canal or remove the protective wax that lines the canal. This makes it easier for bacteria and fungi to grow.  Avoid swimming in lakes, polluted water, or poorly chlorinated pools.  You may use ear drops made of rubbing alcohol and vinegar after swimming. Combine equal parts of white vinegar and alcohol in a bottle. Put 3 or 4 drops into each ear after swimming. HOME CARE INSTRUCTIONS   Apply antibiotic ear drops to the ear canal as prescribed by your caregiver.  Only take  over-the-counter or prescription medicines for pain, discomfort, or fever as directed by your caregiver.  If you have diabetes, follow any additional treatment instructions from your caregiver.  Keep all follow-up appointments as directed by your caregiver. SEEK MEDICAL CARE IF:   You have a fever.  Your ear is still red, swollen, painful, or draining pus after 3 days.  Your redness, swelling, or pain gets worse.  You have a severe headache.  You have redness, swelling, pain, or tenderness in the area behind your ear. MAKE SURE YOU:   Understand these instructions.  Will watch your condition.  Will get help right away if you are not doing well or get worse. Document Released: 12/27/2004 Document Revised: 03/21/2011 Document Reviewed: 01/13/2011 [facility] Patient Information 2014 [place].

## 2013-01-29 DIAGNOSIS — H6240 Otitis externa in other diseases classified elsewhere, unspecified ear: Principal | ICD-10-CM

## 2013-01-29 DIAGNOSIS — B369 Superficial mycosis, unspecified: Secondary | ICD-10-CM | POA: Insufficient documentation

## 2013-01-29 DIAGNOSIS — H6980 Other specified disorders of Eustachian tube, unspecified ear: Secondary | ICD-10-CM | POA: Insufficient documentation

## 2013-01-29 NOTE — Assessment & Plan Note (Signed)
Rx VoSol. Place cotton balls in the ear before showering. Return in one week for reassessment.

## 2013-01-29 NOTE — Assessment & Plan Note (Signed)
Rx Flonase. Encourage daily Claritin or Zyrtec.

## 2013-01-30 ENCOUNTER — Telehealth: Payer: Self-pay | Admitting: Physician Assistant

## 2013-01-30 NOTE — Telephone Encounter (Signed)
Patient called back regarding this.

## 2013-01-30 NOTE — Telephone Encounter (Signed)
Request directions for ear drops

## 2013-01-30 NOTE — Telephone Encounter (Signed)
Patient instructed to apply 4 drops in each ear TID for 1 week.  Has follow-up on Monday.

## 2013-01-30 NOTE — Telephone Encounter (Signed)
Please advise 

## 2013-02-04 ENCOUNTER — Ambulatory Visit (INDEPENDENT_AMBULATORY_CARE_PROVIDER_SITE_OTHER): Payer: PRIVATE HEALTH INSURANCE | Admitting: Physician Assistant

## 2013-02-04 ENCOUNTER — Encounter: Payer: Self-pay | Admitting: Physician Assistant

## 2013-02-04 VITALS — BP 124/96 | HR 89 | Temp 97.9°F | Resp 18 | Ht 67.0 in | Wt 259.5 lb

## 2013-02-04 DIAGNOSIS — B369 Superficial mycosis, unspecified: Secondary | ICD-10-CM

## 2013-02-04 DIAGNOSIS — B49 Unspecified mycosis: Secondary | ICD-10-CM

## 2013-02-04 DIAGNOSIS — H9190 Unspecified hearing loss, unspecified ear: Secondary | ICD-10-CM

## 2013-02-04 DIAGNOSIS — H6240 Otitis externa in other diseases classified elsewhere, unspecified ear: Secondary | ICD-10-CM

## 2013-02-04 NOTE — Progress Notes (Signed)
Pre visit review using our clinic review tool, if applicable. No additional management support is needed unless otherwise documented below in the visit note/SLS  

## 2013-02-04 NOTE — Patient Instructions (Signed)
Stop the medicated drops.  Your external ear infection has resolved.  Keep cotton in your ears to prevent water from entering ear canal.  You will be contacted by ENT for further evaluation and treatment.  Please return to clinic if symptoms change in the meantime.

## 2013-02-05 DIAGNOSIS — H9190 Unspecified hearing loss, unspecified ear: Secondary | ICD-10-CM | POA: Insufficient documentation

## 2013-02-05 NOTE — Progress Notes (Signed)
Patient presents to clinic today c/o for 1 week follow-up of otitis externa.  Patient has been taking Vosol as prescribed.  States ear pain and drainage have completely resolved.  Still endorses decreased hearing.  Denies tinnitus.  Denies fever, chills, sweats.  Denies other URI symptom.  Has been using cotton balls placed at opening of ear canals to prevent water from getting in his ears.  Past Medical History  Diagnosis Date  . Chicken pox as a child  . Mumps as a child  . Measles as a child  . Gout   . Hypertension   . Obese   . Fatigue   . Gout   . Acute bronchitis 08/16/2010  . Nocturia 08/16/2010  . Insomnia 08/16/2010  . Preventative health care 09/06/2011  . Sleep apnea 09/06/2011  . Anxiety 09/06/2011  . Hyperglycemia 09/06/2011  . Abnormal LFTs 09/06/2011  . Hyperlipidemia 09/06/2011  . Low testosterone 02/28/2012  . Unspecified vitamin D deficiency 02/28/2012  . Knee pain, acute 02/28/2012  . Grief reaction 09/09/2012    Current Outpatient Prescriptions on File Prior to Visit  Medication Sig Dispense Refill  . acetic acid-hydrocortisone (VOSOL-HC) otic solution For 7 days.  10 mL  0  . allopurinol (ZYLOPRIM) 100 MG tablet Take 1 tablet (100 mg total) by mouth daily.  30 tablet  3  . ALPRAZolam (XANAX) 1 MG tablet Take 1 tablet (1 mg total) by mouth 2 (two) times daily as needed for sleep or anxiety.  30 tablet  2  . azelastine (ASTELIN) 137 MCG/SPRAY nasal spray Place 2 sprays into both nostrils 2 (two) times daily. Use in each nostril as directed      . ciprofloxacin-dexamethasone (CIPRODEX) otic suspension Place 4 drops into the right ear 2 (two) times daily.      . Cyanocobalamin (B-12 PO) Take by mouth daily.        . fish oil-omega-3 fatty acids 1000 MG capsule Take 2 g by mouth 2 (two) times daily.        . fluticasone (FLONASE) 50 MCG/ACT nasal spray Place 2 sprays into both nostrils daily.  16 g  6  . losartan-hydrochlorothiazide (HYZAAR) 100-25 MG per tablet Take 1 tablet by  mouth daily.  90 tablet  3  . multivitamin (THERAGRAN) tablet Take 1 tablet by mouth daily.        . Testosterone (AXIRON) 30 MG/ACT SOLN Place 1 Squirt onto the skin daily. 1 pump per clean, dry axillae qam  90 mL  0  . zolpidem (AMBIEN) 10 MG tablet Take 1 tablet (10 mg total) by mouth at bedtime as needed for sleep. For September 2014  30 tablet  4   No current facility-administered medications on file prior to visit.    No Known Allergies  Family History  Problem Relation Age of Onset  . Diabetes Mother     type 2  . Cancer Maternal Grandmother     throat  . Heart disease Maternal Grandfather   . Diabetes Maternal Grandfather   . Colon polyps Neg Hx   . Depression Daughter     anxiety    History   Social History  . Marital Status: Married    Spouse Name: N/A    Number of Children: N/A  . Years of Education: N/A   Social History Main Topics  . Smoking status: Former Smoker    Types: Cigarettes    Quit date: 08/30/1968  . Smokeless tobacco: Never Used  Comment: smoked in high school  . Alcohol Use: No     Comment: very little  . Drug Use: No  . Sexual Activity: Yes    Partners: Female   Other Topics Concern  . None   Social History Narrative  . None   Review of Systems - See HPI.  All other ROS are negative.  Filed Vitals:   02/04/13 1457  BP: 124/96  Pulse: 89  Temp: 97.9 F (36.6 C)  Resp: 18   Physical Exam  Constitutional: He is oriented to person, place, and time and well-developed, well-nourished, and in no distress.  HENT:  Head: Normocephalic and atraumatic.  Right Ear: Tympanic membrane, external ear and ear canal normal. No drainage, swelling or tenderness. No foreign bodies. No mastoid tenderness. No middle ear effusion. Decreased hearing is noted.  Left Ear: Tympanic membrane, external ear and ear canal normal. No drainage, swelling or tenderness. No foreign bodies. No mastoid tenderness.  No middle ear effusion. Decreased hearing is  noted.  Nose: Nose normal.  Mouth/Throat: Oropharynx is clear and moist. No oropharyngeal exudate.  Eyes: Conjunctivae are normal.  Neck: Neck supple.  Cardiovascular: Normal rate, regular rhythm, normal heart sounds and intact distal pulses.   Pulmonary/Chest: Effort normal and breath sounds normal.  Lymphadenopathy:    He has no cervical adenopathy.  Neurological: He is alert and oriented to person, place, and time.  Skin: Skin is warm and dry. No rash noted.  Psychiatric: Affect normal.   No results found for this or any previous visit (from the past 2160 hour(s)).  Assessment/Plan: No problem-specific assessment & plan notes found for this encounter.

## 2013-02-05 NOTE — Assessment & Plan Note (Signed)
Resolved.  Patient instructed to stop Vosol drops.

## 2013-02-05 NOTE — Assessment & Plan Note (Signed)
Decreased hearing remains, despite successful treatment for otitis externa.  Will set up referral to ENT for further evaluation, formal audiology and treatment.

## 2013-02-06 ENCOUNTER — Other Ambulatory Visit: Payer: Self-pay | Admitting: Family Medicine

## 2013-02-06 ENCOUNTER — Telehealth: Payer: Self-pay | Admitting: Family Medicine

## 2013-02-06 NOTE — Telephone Encounter (Signed)
Signed.

## 2013-02-06 NOTE — Telephone Encounter (Signed)
Pt left message requesting status of ENT referral. Upon review of chart it appears pt was just notified of appt at 11:37 today.

## 2013-02-06 NOTE — Telephone Encounter (Signed)
Last refill 09/04/13, #30 x 4 refills.  Rx printed and forwarded to Provider for signature.

## 2013-02-06 NOTE — Telephone Encounter (Signed)
Notified pt of refill completion.

## 2013-03-05 ENCOUNTER — Other Ambulatory Visit: Payer: Self-pay | Admitting: Family

## 2013-03-06 NOTE — Telephone Encounter (Signed)
Refill request for zolpidem Last filled by MD on - 02/06/2013 #30 x0 Last Appt: 02/04/2013 Next Appt: none Please advise refill?

## 2013-03-18 ENCOUNTER — Encounter: Payer: Self-pay | Admitting: Family Medicine

## 2013-03-18 ENCOUNTER — Ambulatory Visit (INDEPENDENT_AMBULATORY_CARE_PROVIDER_SITE_OTHER): Payer: PRIVATE HEALTH INSURANCE | Admitting: Family Medicine

## 2013-03-18 VITALS — BP 122/82 | HR 83 | Temp 97.7°F | Ht 67.0 in | Wt 260.0 lb

## 2013-03-18 DIAGNOSIS — G4700 Insomnia, unspecified: Secondary | ICD-10-CM

## 2013-03-18 DIAGNOSIS — I10 Essential (primary) hypertension: Secondary | ICD-10-CM

## 2013-03-18 DIAGNOSIS — M25562 Pain in left knee: Secondary | ICD-10-CM

## 2013-03-18 DIAGNOSIS — E669 Obesity, unspecified: Secondary | ICD-10-CM

## 2013-03-18 DIAGNOSIS — M25569 Pain in unspecified knee: Secondary | ICD-10-CM

## 2013-03-18 MED ORDER — MELOXICAM 15 MG PO TABS: 15.0000 mg | ORAL_TABLET | Freq: Every day | ORAL | Status: DC | PRN

## 2013-03-18 MED ORDER — ALPRAZOLAM 1 MG PO TABS: 1.0000 mg | ORAL_TABLET | Freq: Two times a day (BID) | ORAL | Status: DC | PRN

## 2013-03-18 NOTE — Progress Notes (Signed)
Pre visit review using our clinic review tool, if applicable. No additional management support is needed unless otherwise documented below in the visit note. 

## 2013-03-18 NOTE — Patient Instructions (Signed)
Bursitis Bursitis is a swelling and soreness (inflammation) of a fluid-filled sac (bursa) that overlies and protects a joint. It can be caused by injury, overuse of the joint, arthritis or infection. The joints most likely to be affected are the elbows, shoulders, hips and knees. HOME CARE INSTRUCTIONS   Apply ice to the affected area for 15-20 minutes each hour while awake for 2 days. Put the ice in a plastic bag and place a towel between the bag of ice and your skin.  Rest the injured joint as much as possible, but continue to put the joint through a full range of motion, 4 times per day. (The shoulder joint especially becomes rapidly "frozen" if not used.) When the pain lessens, begin normal slow movements and usual activities.  Only take over-the-counter or prescription medicines for pain, discomfort or fever as directed by your caregiver.  Your caregiver may recommend draining the bursa and injecting medicine into the bursa. This may help the healing process.  Follow all instructions for follow-up with your caregiver. This includes any orthopedic referrals, physical therapy and rehabilitation. Any delay in obtaining necessary care could result in a delay or failure of the bursitis to heal and chronic pain. SEEK IMMEDIATE MEDICAL CARE IF:   Your pain increases even during treatment.  You develop an oral temperature above 102 F (38.9 C) and have heat and inflammation over the involved bursa. MAKE SURE YOU:   Understand these instructions.  Will watch your condition.  Will get help right away if you are not doing well or get worse. Document Released: 12/25/1999 Document Revised: 03/21/2011 Document Reviewed: 11/28/2008 [employer] Patient Information 2014 [employer].  

## 2013-03-24 DIAGNOSIS — M25562 Pain in left knee: Secondary | ICD-10-CM | POA: Insufficient documentation

## 2013-03-24 NOTE — Assessment & Plan Note (Signed)
Recurrent but now improved, denies any new trauma. Was warm but not red or hot. Encouraged mor, ice, rest and topical treatments. Report worsening and/or persistent symptoms for referral

## 2013-03-24 NOTE — Progress Notes (Signed)
Patient ID: David Esparza, male   DOB: [birth year], 63 y.o.   MRN: [id number] LAURI TILL [id number] [birth year] 03/24/2013      Progress Note-Follow Up  Subjective  Chief Complaint  Chief Complaint  Patient presents with  . Knee Pain    and swelling- left knee X 7 days    HPI  Patient is a 63 year old male in today for evaluation of left knee pain. It has been present about a week and has occurred in the past prompting drainage of effusion. Has not happened in years and he denies any trauma or falls. No warmth or redness. He rested and iced it over several days and he is actually feeling much better today. No other c/o. Denies CP/palp/SOB/HA/congestion/fevers/GI or GU c/o. Taking meds as prescribed  Past Medical History  Diagnosis Date  . Chicken pox as a child  . Mumps as a child  . Measles as a child  . Gout   . Hypertension   . Obese   . Fatigue   . Gout   . Acute bronchitis 08/16/2010  . Nocturia 08/16/2010  . Insomnia 08/16/2010  . Preventative health care 09/06/2011  . Sleep apnea 09/06/2011  . Anxiety 09/06/2011  . Hyperglycemia 09/06/2011  . Abnormal LFTs 09/06/2011  . Hyperlipidemia 09/06/2011  . Low testosterone 02/28/2012  . Unspecified vitamin D deficiency 02/28/2012  . Knee pain, acute 02/28/2012  . Grief reaction 09/09/2012    Past Surgical History  Procedure Laterality Date  . Intestines sewn  63 yrs old    shot once made 6 holes  . Tonsillectomy  as a child  . Vasectomy  32 yrs old  . Colonoscopy  09/16/10    Dr. Deatra Ina: moderate diverticulosis sigmoid and descending colon, o/w normal: repeat 10 yrs.    Family History  Problem Relation Age of Onset  . Diabetes Mother     type 2  . Cancer Maternal Grandmother     throat  . Heart disease Maternal Grandfather   . Diabetes Maternal Grandfather   . Colon polyps Neg Hx   . Depression Daughter     anxiety    History   Social History  . Marital Status: Married    Spouse Name: N/A    Number of Children:  N/A  . Years of Education: N/A   Occupational History  . Not on file.   Social History Main Topics  . Smoking status: Former Smoker    Types: Cigarettes    Quit date: 08/30/1968  . Smokeless tobacco: Never Used     Comment: smoked in high school  . Alcohol Use: No     Comment: very little  . Drug Use: No  . Sexual Activity: Yes    Partners: Female   Other Topics Concern  . Not on file   Social History Narrative  . No narrative on file    Current Outpatient Prescriptions on File Prior to Visit  Medication Sig Dispense Refill  . acetic acid-hydrocortisone (VOSOL-HC) otic solution For 7 days.  10 mL  0  . allopurinol (ZYLOPRIM) 100 MG tablet Take 1 tablet (100 mg total) by mouth daily.  30 tablet  3  . azelastine (ASTELIN) 137 MCG/SPRAY nasal spray Place 2 sprays into both nostrils 2 (two) times daily. Use in each nostril as directed      . ciprofloxacin-dexamethasone (CIPRODEX) otic suspension Place 4 drops into the right ear 2 (two) times daily.      . Cyanocobalamin (  B-12 PO) Take by mouth daily.        . fish oil-omega-3 fatty acids 1000 MG capsule Take 2 g by mouth 2 (two) times daily.        . fluticasone (FLONASE) 50 MCG/ACT nasal spray Place 2 sprays into both nostrils daily.  16 g  6  . losartan-hydrochlorothiazide (HYZAAR) 100-25 MG per tablet Take 1 tablet by mouth daily.  90 tablet  3  . multivitamin (THERAGRAN) tablet Take 1 tablet by mouth daily.        . Testosterone (AXIRON) 30 MG/ACT SOLN Place 1 Squirt onto the skin daily. 1 pump per clean, dry axillae qam  90 mL  0  . zolpidem (AMBIEN) 10 MG tablet TAKE 1 TABLET BY MOUTH EVERY NIGHT AT BEDTIME AS NEEDED FOR SLEEP  30 tablet  0   No current facility-administered medications on file prior to visit.    No Known Allergies  Review of Systems  Review of Systems  Constitutional: Negative for fever and malaise/fatigue.  HENT: Negative for congestion.   Eyes: Negative for discharge.  Respiratory: Negative for  shortness of breath.   Cardiovascular: Negative for chest pain, palpitations and leg swelling.  Gastrointestinal: Negative for nausea, abdominal pain and diarrhea.  Genitourinary: Negative for dysuria.  Musculoskeletal: Positive for joint pain. Negative for falls.       Left knee increased pain and swelling x 7 days actually quite a bit better today  Skin: Negative for rash.  Neurological: Negative for loss of consciousness and headaches.  Endo/Heme/Allergies: Negative for polydipsia.  Psychiatric/Behavioral: Negative for depression and suicidal ideas. The patient is not nervous/anxious and does not have insomnia.     Objective  BP 122/82  Pulse 83  Temp(Src) 97.7 F (36.5 C) (Oral)  Ht 5\' 7"  (1.702 m)  Wt 260 lb (117.935 kg)  BMI 40.71 kg/m2  SpO2 97%  Physical Exam  Physical Exam  Constitutional: He is oriented to person, place, and time and well-developed, well-nourished, and in no distress. No distress.  HENT:  Head: Normocephalic and atraumatic.  Eyes: Conjunctivae are normal.  Neck: Neck supple. No thyromegaly present.  Cardiovascular: Normal rate, regular rhythm and normal heart sounds.   No murmur heard. Pulmonary/Chest: Effort normal and breath sounds normal. No respiratory distress.  Abdominal: He exhibits no distension and no mass. There is no tenderness.  Musculoskeletal: He exhibits no edema and no tenderness.  Normal ROM b/l knees  Neurological: He is alert and oriented to person, place, and time.  Skin: Skin is warm.  Psychiatric: Memory, affect and judgment normal.    Lab Results  Component Value Date   TSH 2.615 09/11/2012   Lab Results  Component Value Date   WBC 7.3 02/21/2012   HGB 18.2 Repeated and verified X2.* 02/21/2012   HCT 54.3* 02/21/2012   MCV 95.7 02/21/2012   PLT 234.0 02/21/2012   Lab Results  Component Value Date   CREATININE 1.12 09/11/2012   BUN 20 09/11/2012   NA 142 09/11/2012   K 4.0 09/11/2012   CL 104 09/11/2012   CO2 24 09/11/2012    Lab Results  Component Value Date   ALT 64* 09/11/2012   AST 48* 09/11/2012   ALKPHOS 67 09/11/2012   BILITOT 0.9 09/11/2012   Lab Results  Component Value Date   CHOL 180 09/11/2012   Lab Results  Component Value Date   HDL 44 09/11/2012   Lab Results  Component Value Date   LDLCALC 95 09/11/2012  Lab Results  Component Value Date   TRIG 206* 09/11/2012   Lab Results  Component Value Date   CHOLHDL 4.1 09/11/2012     Assessment & Plan  Hypertension Well controlled, no changes to meds. Encouraged heart healthy diet such as the DASH diet and exercise as tolerated.   Obese Encouraged DASH diet, decrease po intake and increase exercise as tolerated. Needs 7-8 hours of sleep nightly. Avoid trans fats, eat small, frequent meals every 4-5 hours with lean proteins, complex carbs and healthy fats.   Knee pain, left Recurrent but now improved, denies any new trauma. Was warm but not red or hot. Encouraged mor, ice, rest and topical treatments. Report worsening and/or persistent symptoms for referral

## 2013-03-24 NOTE — Assessment & Plan Note (Signed)
Well controlled, no changes to meds. Encouraged heart healthy diet such as the DASH diet and exercise as tolerated.  °

## 2013-03-24 NOTE — Assessment & Plan Note (Addendum)
Encouraged DASH diet, decrease po intake and increase exercise as tolerated. Needs 7-8 hours of sleep nightly. Avoid trans fats, eat small, frequent meals every 4-5 hours with lean proteins, complex carbs and healthy fats 

## 2013-04-05 ENCOUNTER — Other Ambulatory Visit: Payer: Self-pay | Admitting: Family Medicine

## 2013-04-05 NOTE — Telephone Encounter (Signed)
Please advise? Last RX was done on 03-06-13 for quantity 30 and 0 refills  If ok fax to [phone]

## 2013-04-05 NOTE — Telephone Encounter (Signed)
RX faxed

## 2013-05-02 ENCOUNTER — Other Ambulatory Visit: Payer: Self-pay | Admitting: Family Medicine

## 2013-05-02 NOTE — Telephone Encounter (Signed)
eScribe request from [facility] for refill on Zolpidem Last filled - 03.27.15, #30x0 Last AEX - 03.09.15 Next AEX - 6-Mths Please Advise on refills/SLS

## 2013-05-02 NOTE — Telephone Encounter (Signed)
Rx faxed to pharmacy/SLS 

## 2013-05-26 ENCOUNTER — Other Ambulatory Visit: Payer: Self-pay | Admitting: Family Medicine

## 2013-05-28 ENCOUNTER — Other Ambulatory Visit: Payer: Self-pay | Admitting: Family Medicine

## 2013-05-28 NOTE — Telephone Encounter (Signed)
Please advise?   Last RX was done on 05-02-13 quantity 30 with 0 refills  If ok fax to [phone]

## 2013-05-31 NOTE — Telephone Encounter (Signed)
Rx faxed

## 2013-07-23 ENCOUNTER — Other Ambulatory Visit: Payer: Self-pay | Admitting: Family Medicine

## 2013-07-24 ENCOUNTER — Other Ambulatory Visit: Payer: Self-pay | Admitting: Physician Assistant

## 2013-07-24 NOTE — Telephone Encounter (Signed)
Please advise refill?   Last RX was done on 05-28-13 quantity 30 with 1 refill  Last OV was 03-18-13

## 2013-07-24 NOTE — Telephone Encounter (Signed)
Rx request faxed to pharmacy/SLS  

## 2013-07-24 NOTE — Telephone Encounter (Signed)
Refill granted.  Rx printed, signed and given to nurse to be faxed.

## 2013-08-21 ENCOUNTER — Other Ambulatory Visit: Payer: Self-pay | Admitting: Physician Assistant

## 2013-08-21 NOTE — Telephone Encounter (Signed)
eScribe request from [facility] for refill on Zolpidem Last filled - 07.15.15, #30x0 Last AEX - 03.09.15 Next AEX - 6-mths Please Advise on refills/SLS

## 2013-08-22 NOTE — Telephone Encounter (Signed)
Rx request faxed to pharmacy/SLS  

## 2013-09-03 ENCOUNTER — Other Ambulatory Visit: Payer: Self-pay | Admitting: Family Medicine

## 2013-09-03 NOTE — Telephone Encounter (Signed)
Inform pt that 1 more month of these two medications have been sent but an appt for a physical needs to be made. md wanted patient to return around 09-18-13

## 2013-09-03 NOTE — Telephone Encounter (Signed)
Informed patient of this and he scheduled cpe with Einar Pheasant

## 2013-09-08 ENCOUNTER — Other Ambulatory Visit: Payer: Self-pay | Admitting: Family Medicine

## 2013-09-30 ENCOUNTER — Encounter: Payer: PRIVATE HEALTH INSURANCE | Admitting: Physician Assistant

## 2013-10-07 ENCOUNTER — Encounter: Payer: Self-pay | Admitting: Physician Assistant

## 2013-10-07 ENCOUNTER — Ambulatory Visit (INDEPENDENT_AMBULATORY_CARE_PROVIDER_SITE_OTHER): Payer: PRIVATE HEALTH INSURANCE | Admitting: Physician Assistant

## 2013-10-07 VITALS — BP 117/64 | HR 97 | Temp 99.0°F | Resp 18 | Ht 67.0 in | Wt 254.4 lb

## 2013-10-07 DIAGNOSIS — G4700 Insomnia, unspecified: Secondary | ICD-10-CM

## 2013-10-07 DIAGNOSIS — Z136 Encounter for screening for cardiovascular disorders: Secondary | ICD-10-CM

## 2013-10-07 DIAGNOSIS — E790 Hyperuricemia without signs of inflammatory arthritis and tophaceous disease: Secondary | ICD-10-CM

## 2013-10-07 DIAGNOSIS — I10 Essential (primary) hypertension: Secondary | ICD-10-CM

## 2013-10-07 DIAGNOSIS — Z0000 Encounter for general adult medical examination without abnormal findings: Secondary | ICD-10-CM

## 2013-10-07 DIAGNOSIS — R7989 Other specified abnormal findings of blood chemistry: Secondary | ICD-10-CM

## 2013-10-07 DIAGNOSIS — E291 Testicular hypofunction: Secondary | ICD-10-CM

## 2013-10-07 DIAGNOSIS — E785 Hyperlipidemia, unspecified: Secondary | ICD-10-CM

## 2013-10-07 DIAGNOSIS — M109 Gout, unspecified: Secondary | ICD-10-CM

## 2013-10-07 MED ORDER — SUVOREXANT 20 MG PO TABS: 1.0000 | ORAL_TABLET | Freq: Every evening | ORAL | Status: DC | PRN

## 2013-10-07 MED ORDER — ALLOPURINOL 300 MG PO TABS: 300.0000 mg | ORAL_TABLET | Freq: Every day | ORAL | Status: DC

## 2013-10-07 MED ORDER — AMLODIPINE BESYLATE 5 MG PO TABS: 5.0000 mg | ORAL_TABLET | Freq: Every day | ORAL | Status: DC

## 2013-10-07 NOTE — Progress Notes (Signed)
Pre visit review using our clinic review tool, if applicable. No additional management support is needed unless otherwise documented below in the visit note/SLS  

## 2013-10-07 NOTE — Assessment & Plan Note (Signed)
Patient off of medication at present.  Requesting recheck of testosterone level.  Order placed.

## 2013-10-07 NOTE — Assessment & Plan Note (Signed)
Doing well on [place].  Medication refilled.

## 2013-10-07 NOTE — Patient Instructions (Signed)
Please continue medications as directed. Please obtain fasting labs at work.  Have them fax the results to [place].  Follow-up with Dr. Charlett Blake in 1 month to see how the Verdie Shire is working.  Preventive Care for Adults A healthy lifestyle and preventive care can promote health and wellness. Preventive health guidelines for men include the following key practices:  A routine yearly physical is a good way to check with your health care provider about your health and preventative screening. It is a chance to share any concerns and updates on your health and to receive a thorough exam.  Visit your dentist for a routine exam and preventative care every 6 months. Brush your teeth twice a day and floss once a day. Good oral hygiene prevents tooth decay and gum disease.  The frequency of eye exams is based on your age, health, family medical history, use of contact lenses, and other factors. Follow your health care provider's recommendations for frequency of eye exams.  Eat a healthy diet. Foods such as vegetables, fruits, whole grains, low-fat dairy products, and lean protein foods contain the nutrients you need without too many calories. Decrease your intake of foods high in solid fats, added sugars, and salt. Eat the right amount of calories for you.Get information about a proper diet from your health care provider, if necessary.  Regular physical exercise is one of the most important things you can do for your health. Most adults should get at least 150 minutes of moderate-intensity exercise (any activity that increases your heart rate and causes you to sweat) each week. In addition, most adults need muscle-strengthening exercises on 2 or more days a week.  Maintain a healthy weight. The body mass index (BMI) is a screening tool to identify possible weight problems. It provides an estimate of body fat based on height and weight. Your health care provider can find your BMI and can help you achieve or maintain a  healthy weight.For adults 20 years and older:  A BMI below 18.5 is considered underweight.  A BMI of 18.5 to 24.9 is normal.  A BMI of 25 to 29.9 is considered overweight.  A BMI of 30 and above is considered obese.  Maintain normal blood lipids and cholesterol levels by exercising and minimizing your intake of saturated fat. Eat a balanced diet with plenty of fruit and vegetables. Blood tests for lipids and cholesterol should begin at age 3 and be repeated every 5 years. If your lipid or cholesterol levels are high, you are over 50, or you are at high risk for heart disease, you may need your cholesterol levels checked more frequently.Ongoing high lipid and cholesterol levels should be treated with medicines if diet and exercise are not working.  If you smoke, find out from your health care provider how to quit. If you do not use tobacco, do not start.  Lung cancer screening is recommended for adults aged 55-80 years who are at high risk for developing lung cancer because of a history of smoking. A yearly low-dose CT scan of the lungs is recommended for people who have at least a 30-pack-year history of smoking and are a current smoker or have quit within the past 15 years. A pack year of smoking is smoking an average of 1 pack of cigarettes a day for 1 year (for example: 1 pack a day for 30 years or 2 packs a day for 15 years). Yearly screening should continue until the smoker has stopped smoking for at least  15 years. Yearly screening should be stopped for people who develop a health problem that would prevent them from having lung cancer treatment.  If you choose to drink alcohol, do not have more than 2 drinks per day. One drink is considered to be 12 ounces (355 mL) of beer, 5 ounces (148 mL) of wine, or 1.5 ounces (44 mL) of liquor.  Avoid use of street drugs. Do not share needles with anyone. Ask for help if you need support or instructions about stopping the use of drugs.  High blood  pressure causes heart disease and increases the risk of stroke. Your blood pressure should be checked at least every 1-2 years. Ongoing high blood pressure should be treated with medicines, if weight loss and exercise are not effective.  If you are 41-39 years old, ask your health care provider if you should take aspirin to prevent heart disease.  Diabetes screening involves taking a blood sample to check your fasting blood sugar level. This should be done once every 3 years, after age 83, if you are within normal weight and without risk factors for diabetes. Testing should be considered at a younger age or be carried out more frequently if you are overweight and have at least 1 risk factor for diabetes.  Colorectal cancer can be detected and often prevented. Most routine colorectal cancer screening begins at the age of 34 and continues through age 17. However, your health care provider may recommend screening at an earlier age if you have risk factors for colon cancer. On a yearly basis, your health care provider may provide home test kits to check for hidden blood in the stool. Use of a small camera at the end of a tube to directly examine the colon (sigmoidoscopy or colonoscopy) can detect the earliest forms of colorectal cancer. Talk to your health care provider about this at age 66, when routine screening begins. Direct exam of the colon should be repeated every 5-10 years through age 20, unless early forms of precancerous polyps or small growths are found.  People who are at an increased risk for hepatitis B should be screened for this virus. You are considered at high risk for hepatitis B if:  You were born in a country where hepatitis B occurs often. Talk with your health care provider about which countries are considered high risk.  Your parents were born in a high-risk country and you have not received a shot to protect against hepatitis B (hepatitis B vaccine).  You have HIV or AIDS.  You  use needles to inject street drugs.  You live with, or have sex with, someone who has hepatitis B.  You are a man who has sex with other men (MSM).  You get hemodialysis treatment.  You take certain medicines for conditions such as cancer, organ transplantation, and autoimmune conditions.  Hepatitis C blood testing is recommended for all people born from [birth year] through 1965 and any individual with known risks for hepatitis C.  Practice safe sex. Use condoms and avoid high-risk sexual practices to reduce the spread of sexually transmitted infections (STIs). STIs include gonorrhea, chlamydia, syphilis, trichomonas, herpes, HPV, and human immunodeficiency virus (HIV). Herpes, HIV, and HPV are viral illnesses that have no cure. They can result in disability, cancer, and death.  If you are at risk of being infected with HIV, it is recommended that you take a prescription medicine daily to prevent HIV infection. This is called preexposure prophylaxis (PrEP). You are considered at risk  if:  You are a man who has sex with other men (MSM) and have other risk factors.  You are a heterosexual man, are sexually active, and are at increased risk for HIV infection.  You take drugs by injection.  You are sexually active with a partner who has HIV.  Talk with your health care provider about whether you are at high risk of being infected with HIV. If you choose to begin PrEP, you should first be tested for HIV. You should then be tested every 3 months for as long as you are taking PrEP.  A one-time screening for abdominal aortic aneurysm (AAA) and surgical repair of large AAAs by ultrasound are recommended for men ages 42 to 46 years who are current or former smokers.  Healthy men should no longer receive prostate-specific antigen (PSA) blood tests as part of routine cancer screening. Talk with your health care provider about prostate cancer screening.  Testicular cancer screening is not recommended for  adult males who have no symptoms. Screening includes self-exam, a health care provider exam, and other screening tests. Consult with your health care provider about any symptoms you have or any concerns you have about testicular cancer.  Use sunscreen. Apply sunscreen liberally and repeatedly throughout the day. You should seek shade when your shadow is shorter than you. Protect yourself by wearing long sleeves, pants, a wide-brimmed hat, and sunglasses year round, whenever you are outdoors.  Once a month, do a whole-body skin exam, using a mirror to look at the skin on your back. Tell your health care provider about new moles, moles that have irregular borders, moles that are larger than a pencil eraser, or moles that have changed in shape or color.  Stay current with required vaccines (immunizations).  Influenza vaccine. All adults should be immunized every year.  Tetanus, diphtheria, and acellular pertussis (Td, Tdap) vaccine. An adult who has not previously received Tdap or who does not know his vaccine status should receive 1 dose of Tdap. This initial dose should be followed by tetanus and diphtheria toxoids (Td) booster doses every 10 years. Adults with an unknown or incomplete history of completing a 3-dose immunization series with Td-containing vaccines should begin or complete a primary immunization series including a Tdap dose. Adults should receive a Td booster every 10 years.  Varicella vaccine. An adult without evidence of immunity to varicella should receive 2 doses or a second dose if he has previously received 1 dose.  Human papillomavirus (HPV) vaccine. Males aged 62-21 years who have not received the vaccine previously should receive the 3-dose series. Males aged 22-26 years may be immunized. Immunization is recommended through the age of 19 years for any male who has sex with males and did not get any or all doses earlier. Immunization is recommended for any person with an  immunocompromised condition through the age of 12 years if he did not get any or all doses earlier. During the 3-dose series, the second dose should be obtained 4-8 weeks after the first dose. The third dose should be obtained 24 weeks after the first dose and 16 weeks after the second dose.  Zoster vaccine. One dose is recommended for adults aged 12 years or older unless certain conditions are present.  Measles, mumps, and rubella (MMR) vaccine. Adults born before [birth year] generally are considered immune to measles and mumps. Adults born in [birth year] or later should have 1 or more doses of MMR vaccine unless there is a contraindication to the  vaccine or there is laboratory evidence of immunity to each of the three diseases. A routine second dose of MMR vaccine should be obtained at least 28 days after the first dose for students attending postsecondary schools, health care workers, or international travelers. People who received inactivated measles vaccine or an unknown type of measles vaccine during 1963-1967 should receive 2 doses of MMR vaccine. People who received inactivated mumps vaccine or an unknown type of mumps vaccine before 1979 and are at high risk for mumps infection should consider immunization with 2 doses of MMR vaccine. Unvaccinated health care workers born before [birth year] who lack laboratory evidence of measles, mumps, or rubella immunity or laboratory confirmation of disease should consider measles and mumps immunization with 2 doses of MMR vaccine or rubella immunization with 1 dose of MMR vaccine.  Pneumococcal 13-valent conjugate (PCV13) vaccine. When indicated, a person who is uncertain of his immunization history and has no record of immunization should receive the PCV13 vaccine. An adult aged 81 years or older who has certain medical conditions and has not been previously immunized should receive 1 dose of PCV13 vaccine. This PCV13 should be followed with a dose of pneumococcal polysaccharide  (PPSV23) vaccine. The PPSV23 vaccine dose should be obtained at least 8 weeks after the dose of PCV13 vaccine. An adult aged 23 years or older who has certain medical conditions and previously received 1 or more doses of PPSV23 vaccine should receive 1 dose of PCV13. The PCV13 vaccine dose should be obtained 1 or more years after the last PPSV23 vaccine dose.  Pneumococcal polysaccharide (PPSV23) vaccine. When PCV13 is also indicated, PCV13 should be obtained first. All adults aged 44 years and older should be immunized. An adult younger than age 61 years who has certain medical conditions should be immunized. Any person who resides in a nursing home or long-term care facility should be immunized. An adult smoker should be immunized. People with an immunocompromised condition and certain other conditions should receive both PCV13 and PPSV23 vaccines. People with human immunodeficiency virus (HIV) infection should be immunized as soon as possible after diagnosis. Immunization during chemotherapy or radiation therapy should be avoided. Routine use of PPSV23 vaccine is not recommended for American Indians, [place] Natives, or people younger than 65 years unless there are medical conditions that require PPSV23 vaccine. When indicated, people who have unknown immunization and have no record of immunization should receive PPSV23 vaccine. One-time revaccination 5 years after the first dose of PPSV23 is recommended for people aged 19-64 years who have chronic kidney failure, nephrotic syndrome, asplenia, or immunocompromised conditions. People who received 1-2 doses of PPSV23 before age 78 years should receive another dose of PPSV23 vaccine at age 14 years or later if at least 5 years have passed since the previous dose. Doses of PPSV23 are not needed for people immunized with PPSV23 at or after age 25 years.  Meningococcal vaccine. Adults with asplenia or persistent complement component deficiencies should receive 2  doses of quadrivalent meningococcal conjugate (MenACWY-D) vaccine. The doses should be obtained at least 2 months apart. Microbiologists working with certain meningococcal bacteria, [employer] recruits, people at risk during an outbreak, and people who travel to or live in countries with a high rate of meningitis should be immunized. A first-year college student up through age 38 years who is living in a residence hall should receive a dose if he did not receive a dose on or after his 16th birthday. Adults who have certain high-risk conditions should  receive one or more doses of vaccine.  Hepatitis A vaccine. Adults who wish to be protected from this disease, have certain high-risk conditions, work with hepatitis A-infected animals, work in hepatitis A research labs, or travel to or work in countries with a high rate of hepatitis A should be immunized. Adults who were previously unvaccinated and who anticipate close contact with an international adoptee during the first 60 days after arrival in the [place] from a country with a high rate of hepatitis A should be immunized.  Hepatitis B vaccine. Adults should be immunized if they wish to be protected from this disease, have certain high-risk conditions, may be exposed to blood or other infectious body fluids, are household contacts or sex partners of hepatitis B positive people, are clients or workers in certain care facilities, or travel to or work in countries with a high rate of hepatitis B.  Haemophilus influenzae type b (Hib) vaccine. A previously unvaccinated person with asplenia or sickle cell disease or having a scheduled splenectomy should receive 1 dose of Hib vaccine. Regardless of previous immunization, a recipient of a hematopoietic stem cell transplant should receive a 3-dose series 6-12 months after his successful transplant. Hib vaccine is not recommended for adults with HIV infection. Preventive Service / Frequency Ages 25 to 4  Blood  pressure check.** / Every 1 to 2 years.  Lipid and cholesterol check.** / Every 5 years beginning at age 68.  Hepatitis C blood test.** / For any individual with known risks for hepatitis C.  Skin self-exam. / Monthly.  Influenza vaccine. / Every year.  Tetanus, diphtheria, and acellular pertussis (Tdap, Td) vaccine.** / Consult your health care provider. 1 dose of Td every 10 years.  Varicella vaccine.** / Consult your health care provider.  HPV vaccine. / 3 doses over 6 months, if 71 or younger.  Measles, mumps, rubella (MMR) vaccine.** / You need at least 1 dose of MMR if you were born in 1957 or later. You may also need a second dose.  Pneumococcal 13-valent conjugate (PCV13) vaccine.** / Consult your health care provider.  Pneumococcal polysaccharide (PPSV23) vaccine.** / 1 to 2 doses if you smoke cigarettes or if you have certain conditions.  Meningococcal vaccine.** / 1 dose if you are age 63 to 55 years and a Market researcher living in a residence hall, or have one of several medical conditions. You may also need additional booster doses.  Hepatitis A vaccine.** / Consult your health care provider.  Hepatitis B vaccine.** / Consult your health care provider.  Haemophilus influenzae type b (Hib) vaccine.** / Consult your health care provider. Ages 56 to 55  Blood pressure check.** / Every 1 to 2 years.  Lipid and cholesterol check.** / Every 5 years beginning at age 23.  Lung cancer screening. / Every year if you are aged 2-80 years and have a 30-pack-year history of smoking and currently smoke or have quit within the past 15 years. Yearly screening is stopped once you have quit smoking for at least 15 years or develop a health problem that would prevent you from having lung cancer treatment.  Fecal occult blood test (FOBT) of stool. / Every year beginning at age 28 and continuing until age 80. You may not have to do this test if you get a colonoscopy every 10  years.  Flexible sigmoidoscopy** or colonoscopy.** / Every 5 years for a flexible sigmoidoscopy or every 10 years for a colonoscopy beginning at age 55 and continuing  until age 53.  Hepatitis C blood test.** / For all people born from [birth year] through 1965 and any individual with known risks for hepatitis C.  Skin self-exam. / Monthly.  Influenza vaccine. / Every year.  Tetanus, diphtheria, and acellular pertussis (Tdap/Td) vaccine.** / Consult your health care provider. 1 dose of Td every 10 years.  Varicella vaccine.** / Consult your health care provider.  Zoster vaccine.** / 1 dose for adults aged 69 years or older.  Measles, mumps, rubella (MMR) vaccine.** / You need at least 1 dose of MMR if you were born in 1957 or later. You may also need a second dose.  Pneumococcal 13-valent conjugate (PCV13) vaccine.** / Consult your health care provider.  Pneumococcal polysaccharide (PPSV23) vaccine.** / 1 to 2 doses if you smoke cigarettes or if you have certain conditions.  Meningococcal vaccine.** / Consult your health care provider.  Hepatitis A vaccine.** / Consult your health care provider.  Hepatitis B vaccine.** / Consult your health care provider.  Haemophilus influenzae type b (Hib) vaccine.** / Consult your health care provider. Ages 68 and over  Blood pressure check.** / Every 1 to 2 years.  Lipid and cholesterol check.**/ Every 5 years beginning at age 74.  Lung cancer screening. / Every year if you are aged 58-80 years and have a 30-pack-year history of smoking and currently smoke or have quit within the past 15 years. Yearly screening is stopped once you have quit smoking for at least 15 years or develop a health problem that would prevent you from having lung cancer treatment.  Fecal occult blood test (FOBT) of stool. / Every year beginning at age 34 and continuing until age 63. You may not have to do this test if you get a colonoscopy every 10 years.  Flexible  sigmoidoscopy** or colonoscopy.** / Every 5 years for a flexible sigmoidoscopy or every 10 years for a colonoscopy beginning at age 24 and continuing until age 19.  Hepatitis C blood test.** / For all people born from [birth year] through 1966 and any individual with known risks for hepatitis C.  Abdominal aortic aneurysm (AAA) screening.** / A one-time screening for ages 39 to 32 years who are current or former smokers.  Skin self-exam. / Monthly.  Influenza vaccine. / Every year.  Tetanus, diphtheria, and acellular pertussis (Tdap/Td) vaccine.** / 1 dose of Td every 10 years.  Varicella vaccine.** / Consult your health care provider.  Zoster vaccine.** / 1 dose for adults aged 16 years or older.  Pneumococcal 13-valent conjugate (PCV13) vaccine.** / Consult your health care provider.  Pneumococcal polysaccharide (PPSV23) vaccine.** / 1 dose for all adults aged 62 years and older.  Meningococcal vaccine.** / Consult your health care provider.  Hepatitis A vaccine.** / Consult your health care provider.  Hepatitis B vaccine.** / Consult your health care provider.  Haemophilus influenzae type b (Hib) vaccine.** / Consult your health care provider. **Family history and personal history of risk and conditions may change your health care provider's recommendations. Document Released: 02/22/2001 Document Revised: 01/01/2013 Document Reviewed: 05/24/2010 [facility] Patient Information 2015 [place], [place]. This information is not intended to replace advice given to you by your health care provider. Make sure you discuss any questions you have with your health care provider.

## 2013-10-07 NOTE — Assessment & Plan Note (Signed)
Allopurinol increased to 300 mg daily.  Medications refilled.  Will obtain uric acid level.

## 2013-10-07 NOTE — Progress Notes (Signed)
Patient presents today for CPE.  Patient is not fasting and wishes to have labs done at work.  Patient denies acute concerns at today's visit.  Was seen in [place] last month by his previous PCP for medication management as he travels back and forth between [place] and [place].  Allopurinol increased to 300 mg daily as patient was still having acute gout attacks on the 100 mg dosing.  Also endorses Ambien for insomnia was switched to Belsomra.  Patient endorses doing well on this medication. Denies lab work being checked at that visit.  Patient endorses up-t0-date on Colonoscopy.  Declines Flu, Pneumonia and Shingles vaccinations.  Wants to update his Tetanus but wishes to come back Friday for this due to time constraints.   Past Medical History  Diagnosis Date  . Chicken pox as a child  . Mumps as a child  . Measles as a child  . Gout   . Hypertension   . Obese   . Fatigue   . Gout   . Acute bronchitis 08/16/2010  . Nocturia 08/16/2010  . Insomnia 08/16/2010  . Preventative health care 09/06/2011  . Sleep apnea 09/06/2011  . Anxiety 09/06/2011  . Hyperglycemia 09/06/2011  . Abnormal LFTs 09/06/2011  . Hyperlipidemia 09/06/2011  . Low testosterone 02/28/2012  . Unspecified vitamin D deficiency 02/28/2012  . Knee pain, acute 02/28/2012  . Grief reaction 09/09/2012    Current Outpatient Prescriptions on File Prior to Visit  Medication Sig Dispense Refill  . ALPRAZolam (XANAX) 1 MG tablet TAKE 1 TABLET BY MOUTH TWICE DAILY AS NEEDED FOR SLEEP OR ANXIETY  30 tablet  0  . fish oil-omega-3 fatty acids 1000 MG capsule Take 2 g by mouth 2 (two) times daily.        Marland Kitchen losartan-hydrochlorothiazide (HYZAAR) 100-25 MG per tablet TAKE 1 TABLET BY MOUTH DAILY  90 tablet  0  . multivitamin (THERAGRAN) tablet Take 1 tablet by mouth daily.         No current facility-administered medications on file prior to visit.    No Known Allergies  Family History  Problem Relation Age of Onset  . Diabetes Mother    type 2  . Cancer Maternal Grandmother     throat  . Heart disease Maternal Grandfather   . Diabetes Maternal Grandfather   . Colon polyps Neg Hx   . Depression Daughter     anxiety    History   Social History  . Marital Status: Married    Spouse Name: N/A    Number of Children: N/A  . Years of Education: N/A   Social History Main Topics  . Smoking status: Former Smoker    Types: Cigarettes    Quit date: 08/30/1968  . Smokeless tobacco: Never Used     Comment: smoked in high school  . Alcohol Use: No     Comment: very little  . Drug Use: No  . Sexual Activity: Yes    Partners: Female   Other Topics Concern  . None   Social History Narrative  . None   Review of Systems  Constitutional: Negative for fever and weight loss.  HENT: Negative for ear discharge, ear pain, hearing loss and tinnitus.   Eyes: Negative for blurred vision, double vision, photophobia and pain.  Respiratory: Negative for cough and shortness of breath.   Cardiovascular: Negative for chest pain and palpitations.  Gastrointestinal: Negative for heartburn, nausea, vomiting, abdominal pain, diarrhea, constipation, blood in stool and melena.  Genitourinary:  Negative for dysuria, urgency, frequency, hematuria and flank pain.  Neurological: Negative for dizziness and headaches.  Psychiatric/Behavioral: Negative for depression and memory loss.   BP 117/64  Pulse 97  Temp(Src) 99 F (37.2 C) (Oral)  Resp 18  Ht 5\' 7"  (1.702 m)  Wt 254 lb 6 oz (115.384 kg)  BMI 39.83 kg/m2  SpO2 96%  Physical Exam  Vitals reviewed. Constitutional: He is oriented to person, place, and time and well-developed, well-nourished, and in no distress.  HENT:  Head: Normocephalic and atraumatic.  Right Ear: External ear normal.  Left Ear: External ear normal.  Nose: Nose normal.  Mouth/Throat: Oropharynx is clear and moist. No oropharyngeal exudate.  TM within normal limits bilaterally.  Eyes: Conjunctivae are normal.  Pupils are equal, round, and reactive to light.  Neck: Neck supple. No thyromegaly present.  Cardiovascular: Normal rate, regular rhythm, normal heart sounds and intact distal pulses.   Pulmonary/Chest: Effort normal and breath sounds normal. No respiratory distress. He has no wheezes. He has no rales. He exhibits no tenderness.  Abdominal: Soft. Bowel sounds are normal. He exhibits no distension and no mass. There is no tenderness. There is no rebound and no guarding.  Lymphadenopathy:    He has no cervical adenopathy.  Neurological: He is alert and oriented to person, place, and time.  Skin: Skin is warm and dry. No rash noted.  Psychiatric: Affect normal.   Assessment/Plan: Insomnia Doing well on Bellsomra.  Medication refilled.  Preventative health care Medical history reviewed and updated. Patient due for Tetanus.  Will come back Friday for nurse visit to get injection as he is limited on time today.  EKG obtained and without concerning findings.  Patient requests labs at outside lab -- orders written and given to patient with instructions for results to be faxed back to our office.  Gout Allopurinol increased to 300 mg daily.  Medications refilled.  Will obtain uric acid level.  Hyperlipidemia Will recheck fasting lipid panel.  Low testosterone Patient off of medication at present.  Requesting recheck of testosterone level.  Order placed.

## 2013-10-07 NOTE — Assessment & Plan Note (Signed)
Medical history reviewed and updated. Patient due for Tetanus.  Will come back Friday for nurse visit to get injection as he is limited on time today.  EKG obtained and without concerning findings.  Patient requests labs at outside lab -- orders written and given to patient with instructions for results to be faxed back to our office.

## 2013-10-07 NOTE — Assessment & Plan Note (Signed)
Will recheck fasting lipid panel.

## 2014-01-16 ENCOUNTER — Other Ambulatory Visit: Payer: Self-pay | Admitting: Family Medicine

## 2014-02-15 ENCOUNTER — Other Ambulatory Visit: Payer: Self-pay | Admitting: Physician Assistant

## 2014-02-17 NOTE — Telephone Encounter (Signed)
Rx request to pharmacy/SLS  

## 2014-02-25 ENCOUNTER — Telehealth: Payer: Self-pay | Admitting: Family Medicine

## 2014-02-25 NOTE — Telephone Encounter (Signed)
[facility] requesting a refill on Axiron 30 mg actuation soln.  Advise as this medication is not on current list

## 2014-02-25 NOTE — Telephone Encounter (Signed)
No testosterone level since 2014 he needs to do lab work before I can refill

## 2014-02-27 NOTE — Telephone Encounter (Signed)
Called the patient left a detailed message of PCP instructions.  The pharmacy has been informed as well.

## 2014-03-26 ENCOUNTER — Telehealth: Payer: Self-pay | Admitting: Family Medicine

## 2014-03-26 NOTE — Telephone Encounter (Signed)
Caller name: Aviyon Relation to pt: self Call back number: [phone] Pharmacy:  Reason for call:   Patient states that he is getting married and would like to have a blood test for this and wants to know how much it cost if insurance doesn't cover.

## 2014-03-27 NOTE — Telephone Encounter (Signed)
Actually never been asked this question, maybe lab knows what blood test he is referring to, if not he should find out exactly what he needs so I quote him a price

## 2014-03-28 NOTE — Telephone Encounter (Signed)
Called left a detailed message of PCP instructions and to call back with more specifics of labs needing done.

## 2014-04-09 ENCOUNTER — Encounter: Payer: Self-pay | Admitting: Physician Assistant

## 2014-04-09 ENCOUNTER — Ambulatory Visit (INDEPENDENT_AMBULATORY_CARE_PROVIDER_SITE_OTHER): Payer: Commercial Managed Care - PPO | Admitting: Physician Assistant

## 2014-04-09 VITALS — BP 129/83 | HR 108 | Temp 98.2°F | Resp 16 | Ht 67.0 in | Wt 258.4 lb

## 2014-04-09 DIAGNOSIS — Z299 Encounter for prophylactic measures, unspecified: Secondary | ICD-10-CM

## 2014-04-09 DIAGNOSIS — N529 Male erectile dysfunction, unspecified: Secondary | ICD-10-CM | POA: Diagnosis not present

## 2014-04-09 DIAGNOSIS — Z418 Encounter for other procedures for purposes other than remedying health state: Secondary | ICD-10-CM

## 2014-04-09 MED ORDER — SILDENAFIL CITRATE 100 MG PO TABS: 50.0000 mg | ORAL_TABLET | Freq: Every day | ORAL | Status: DC | PRN

## 2014-04-09 NOTE — Progress Notes (Signed)
Pre visit review using our clinic review tool, if applicable. No additional management support is needed unless otherwise documented below in the visit note/SLS  

## 2014-04-09 NOTE — Patient Instructions (Signed)
Please take the Viagra as directed.  Stay well hydrated while on this medication. Continue other medications as directed.  I will call you with your urine results.

## 2014-04-10 LAB — URINALYSIS, ROUTINE W REFLEX MICROSCOPIC
GLUCOSE, UA: NEGATIVE mg/dL
HGB URINE DIPSTICK: NEGATIVE
Ketones, ur: NEGATIVE mg/dL
Leukocytes, UA: NEGATIVE
NITRITE: NEGATIVE
PROTEIN: NEGATIVE mg/dL
Specific Gravity, Urine: 1.027 (ref 1.005–1.030)
Urobilinogen, UA: 0.2 mg/dL (ref 0.0–1.0)
pH: 5 (ref 5.0–8.0)

## 2014-04-11 ENCOUNTER — Telehealth: Payer: Self-pay | Admitting: *Deleted

## 2014-04-11 ENCOUNTER — Telehealth: Payer: Self-pay | Admitting: Family Medicine

## 2014-04-11 DIAGNOSIS — R822 Biliuria: Secondary | ICD-10-CM

## 2014-04-11 DIAGNOSIS — N529 Male erectile dysfunction, unspecified: Secondary | ICD-10-CM | POA: Insufficient documentation

## 2014-04-11 NOTE — Telephone Encounter (Signed)
Notified pt and scheduled lab appt to repeat u/a on 04/17/14 at 8:45am. Future order entered.

## 2014-04-11 NOTE — Assessment & Plan Note (Signed)
We'll begin generic Viagra. R) and handed to patient. Supportive measures discussed. Encouraged good hydration before use of medication. Do not exceed more than 1 dose in 36 hours. Follow-up if symptoms are not improving.

## 2014-04-11 NOTE — Telephone Encounter (Signed)
-----   Message from Brunetta Jeans, PA-C sent at 04/11/2014  7:17 AM EDT ----- Urine with some mild abnormal findings, possibly due to mild dehydration. Would like him to return to lab in 1 week for repeat urine sample.  I want him to be well hydrated that morning before giving sample.

## 2014-04-11 NOTE — Telephone Encounter (Signed)
Caller name:Legault Tre Relation to ZZ:CKIC Call back [place]:  Reason for call: pt states he received a call but there was no message, wanted to know if it was you calling him to provide his results from his urine culture that was done yesterday.

## 2014-04-11 NOTE — Progress Notes (Signed)
Patient presents to clinic today to discuss potential medications for erectile dysfunction. Patient states he has dealt with erectile dysfunction for several years, noting it has been gradually worsening. Has not been sexually active for a while, but is now engaged to be married. Is concerned that he will not be able to function" as a man" on his wedding night. Patient is not currently on nitrates for chest pain. Denies chest pain, shortness of breath or racing heart.  Past Medical History  Diagnosis Date  . Chicken pox as a child  . Mumps as a child  . Measles as a child  . Gout   . Hypertension   . Obese   . Fatigue   . Gout   . Acute bronchitis 08/16/2010  . Nocturia 08/16/2010  . Insomnia 08/16/2010  . Preventative health care 09/06/2011  . Sleep apnea 09/06/2011  . Anxiety 09/06/2011  . Hyperglycemia 09/06/2011  . Abnormal LFTs 09/06/2011  . Hyperlipidemia 09/06/2011  . Low testosterone 02/28/2012  . Unspecified vitamin D deficiency 02/28/2012  . Knee pain, acute 02/28/2012  . Grief reaction 09/09/2012    Current Outpatient Prescriptions on File Prior to Visit  Medication Sig Dispense Refill  . allopurinol (ZYLOPRIM) 300 MG tablet TAKE 1 TABLET BY MOUTH DAILY. 30 tablet 1  . ALPRAZolam (XANAX) 1 MG tablet TAKE 1 TABLET BY MOUTH TWICE DAILY AS NEEDED FOR SLEEP OR ANXIETY 30 tablet 0  . amLODipine (NORVASC) 5 MG tablet TAKE 1 TABLET BY MOUTH ONCE A DAY 30 tablet 1  . b complex vitamins capsule Take 1 capsule by mouth every other day.    . colchicine 0.6 MG tablet Take 0.6 mg by mouth as directed. Gout Flare-up    . dextromethorphan-guaiFENesin (MUCINEX DM) 30-600 MG per 12 hr tablet Take 2 tablets by mouth 2 (two) times daily as needed.     . fish oil-omega-3 fatty acids 1000 MG capsule Take 2 g by mouth 2 (two) times daily.      Marland Kitchen losartan-hydrochlorothiazide (HYZAAR) 100-25 MG per tablet TAKE 1 TABLET BY MOUTH ONCE A DAY 30 tablet 2  . multivitamin (THERAGRAN) tablet Take 1 tablet by  mouth daily.       No current facility-administered medications on file prior to visit.    No Known Allergies  Family History  Problem Relation Age of Onset  . Diabetes Mother     type 2  . Cancer Maternal Grandmother     throat  . Heart disease Maternal Grandfather   . Diabetes Maternal Grandfather   . Colon polyps Neg Hx   . Depression Daughter     anxiety    History   Social History  . Marital Status: Married    Spouse Name: N/A  . Number of Children: N/A  . Years of Education: N/A   Social History Main Topics  . Smoking status: Former Smoker    Types: Cigarettes    Quit date: 08/30/1968  . Smokeless tobacco: Never Used     Comment: smoked in high school  . Alcohol Use: No     Comment: very little  . Drug Use: No  . Sexual Activity:    Partners: Female   Other Topics Concern  . None   Social History Narrative    Review of Systems - See HPI.  All other ROS are negative.  BP 129/83 mmHg  Pulse 108  Temp(Src) 98.2 F (36.8 C) (Oral)  Resp 16  Ht 5\' 7"  (1.702 m)  Wt 258 lb 6 oz (117.198 kg)  BMI 40.46 kg/m2  SpO2 97%  Physical Exam  Constitutional: He is oriented to person, place, and time and well-developed, well-nourished, and in no distress.  HENT:  Head: Normocephalic and atraumatic.  Eyes: Conjunctivae are normal.  Cardiovascular: Normal rate, regular rhythm, normal heart sounds and intact distal pulses.   Pulmonary/Chest: Effort normal and breath sounds normal. No respiratory distress. He has no wheezes. He has no rales. He exhibits no tenderness.  Neurological: He is alert and oriented to person, place, and time.  Skin: Skin is warm and dry. No rash noted.  Lesions of concern are consistent with benign seborrheic keratoses.  Psychiatric: Affect normal.  Vitals reviewed.   Recent Results (from the past 2160 hour(s))  Urinalysis, Routine w reflex microscopic     Status: Abnormal   Collection Time: 04/09/14  4:29 PM  Result Value Ref Range    Color, Urine YELLOW YELLOW   APPearance CLOUDY (A) CLEAR   Specific Gravity, Urine 1.027 1.005 - 1.030   pH 5.0 5.0 - 8.0   Glucose, UA NEG NEG mg/dL   Bilirubin Urine SMALL (A) NEG   Ketones, ur NEG NEG mg/dL   Hgb urine dipstick NEG NEG   Protein, ur NEG NEG mg/dL   Urobilinogen, UA 0.2 0.0 - 1.0 mg/dL   Nitrite NEG NEG   Leukocytes, UA NEG NEG    Assessment/Plan: Erectile dysfunction We'll begin generic Viagra. R) and handed to patient. Supportive measures discussed. Encouraged good hydration before use of medication. Do not exceed more than 1 dose in 36 hours. Follow-up if symptoms are not improving.

## 2014-04-11 NOTE — Telephone Encounter (Signed)
I have not called patient this morning, but will inform him of his results/SLS

## 2014-04-15 ENCOUNTER — Other Ambulatory Visit: Payer: Self-pay | Admitting: Family Medicine

## 2014-04-17 ENCOUNTER — Other Ambulatory Visit (INDEPENDENT_AMBULATORY_CARE_PROVIDER_SITE_OTHER): Payer: Commercial Managed Care - PPO

## 2014-04-17 DIAGNOSIS — R822 Biliuria: Secondary | ICD-10-CM

## 2014-04-17 LAB — URINALYSIS, ROUTINE W REFLEX MICROSCOPIC
BILIRUBIN URINE: NEGATIVE
Hgb urine dipstick: NEGATIVE
Ketones, ur: NEGATIVE
Leukocytes, UA: NEGATIVE
Nitrite: NEGATIVE
RBC / HPF: NONE SEEN (ref 0–?)
Specific Gravity, Urine: >=1.03 — AB (ref 1.000–1.030)
TOTAL PROTEIN, URINE-UPE24: NEGATIVE
Urine Glucose: NEGATIVE
Urobilinogen, UA: 0.2 (ref 0.0–1.0)
WBC, UA: NONE SEEN (ref 0–?)
pH: 5.5 (ref 5.0–8.0)

## 2014-04-18 ENCOUNTER — Telehealth: Payer: Self-pay | Admitting: *Deleted

## 2014-04-18 NOTE — Telephone Encounter (Signed)
Received fax from the pharmacy stating the patient is requesting med change to Sildenafil 20mg  tablets #50.  Take 2 to 5 tablets by mouth as directed.  Please advise.//AB/CMA

## 2014-04-18 NOTE — Telephone Encounter (Signed)
Spoke with the pharmacy and they have already received the change.//AB/CMA

## 2014-04-18 NOTE — Telephone Encounter (Signed)
FYI

## 2014-04-18 NOTE — Telephone Encounter (Signed)
OK to make requested change to Sildenafil 20 mg tabs, I usually write rx for 1-4 tabs po daily prn ED, disp #50 with 1 rf

## 2014-11-05 ENCOUNTER — Ambulatory Visit (INDEPENDENT_AMBULATORY_CARE_PROVIDER_SITE_OTHER): Payer: Medicare Other | Admitting: Physician Assistant

## 2014-11-05 ENCOUNTER — Encounter: Payer: Self-pay | Admitting: Physician Assistant

## 2014-11-05 ENCOUNTER — Telehealth: Payer: Self-pay | Admitting: Family Medicine

## 2014-11-05 VITALS — BP 132/91 | HR 87 | Temp 98.1°F | Resp 16 | Ht 67.0 in | Wt 264.4 lb

## 2014-11-05 DIAGNOSIS — R0602 Shortness of breath: Secondary | ICD-10-CM

## 2014-11-05 DIAGNOSIS — J00 Acute nasopharyngitis [common cold]: Secondary | ICD-10-CM | POA: Diagnosis not present

## 2014-11-05 DIAGNOSIS — B9689 Other specified bacterial agents as the cause of diseases classified elsewhere: Secondary | ICD-10-CM

## 2014-11-05 DIAGNOSIS — J208 Acute bronchitis due to other specified organisms: Secondary | ICD-10-CM

## 2014-11-05 MED ORDER — SUVOREXANT 20 MG PO TABS: 20.0000 mg | ORAL_TABLET | Freq: Every day | ORAL | Status: DC

## 2014-11-05 MED ORDER — AMLODIPINE BESYLATE 5 MG PO TABS: 5.0000 mg | ORAL_TABLET | Freq: Every day | ORAL | Status: DC

## 2014-11-05 MED ORDER — LOSARTAN POTASSIUM-HCTZ 100-25 MG PO TABS: 1.0000 | ORAL_TABLET | Freq: Every day | ORAL | Status: DC

## 2014-11-05 MED ORDER — AZITHROMYCIN 250 MG PO TABS: ORAL_TABLET | ORAL | Status: DC

## 2014-11-05 NOTE — Patient Instructions (Signed)
Please take the antibiotic as directed. Increase fluids. Continue Mucinex and rest.  Continue your BP medications as directed. You will be contacted for an echocardiogram. I will also have someone call you when I have spoken to Dr. Charlett Blake regarding blood work (combining my labs and what she wants you to have for a physical) so your lab appointment can be scheduled.  Try to keep up exercise regimen. Weight loss will help with these symptoms and is excellent for your cardiovascular symptoms.  Follow-up will be scheduled based on results.

## 2014-11-05 NOTE — Progress Notes (Signed)
Pre visit review using our clinic review tool, if applicable. No additional management support is needed unless otherwise documented below in the visit note/SLS  

## 2014-11-05 NOTE — Progress Notes (Signed)
Patient presents to clinic today c/o 3-4 weeks of chest congestion with productive cough. Cough is improving daily but still productive. Denies fever, chills or chest pain. Has been taking Mucinex.   Endorses SOB with exertion over the past several months. Symptoms are only present with significant exertion. Body mass index is 41.4 kg/(m^2). Is trying to start exercising. Denies chest pain, palpitations. Endorses some mild dizziness when he exerts himself. Has history of hypertension, previously controlled with current regimen. Endorses prior negative stress tests.   Past Medical History  Diagnosis Date  . Chicken pox as a child  . Mumps as a child  . Measles as a child  . Gout   . Hypertension   . Obese   . Fatigue   . Gout   . Acute bronchitis 08/16/2010  . Nocturia 08/16/2010  . Insomnia 08/16/2010  . Preventative health care 09/06/2011  . Sleep apnea 09/06/2011  . Anxiety 09/06/2011  . Hyperglycemia 09/06/2011  . Abnormal LFTs 09/06/2011  . Hyperlipidemia 09/06/2011  . Low testosterone 02/28/2012  . Unspecified vitamin D deficiency 02/28/2012  . Knee pain, acute 02/28/2012  . Grief reaction 09/09/2012    Current Outpatient Prescriptions on File Prior to Visit  Medication Sig Dispense Refill  . allopurinol (ZYLOPRIM) 300 MG tablet TAKE 1 TABLET BY MOUTH DAILY. (Patient taking differently: TAKE 1 TABLET BY MOUTH DAILY AS NEEDED) 30 tablet 1  . ALPRAZolam (XANAX) 1 MG tablet TAKE 1 TABLET BY MOUTH TWICE DAILY AS NEEDED FOR SLEEP OR ANXIETY 30 tablet 0  . b complex vitamins capsule Take 1 capsule by mouth every other day.    . colchicine 0.6 MG tablet Take 0.6 mg by mouth as directed. Gout Flare-up    . dextromethorphan-guaiFENesin (MUCINEX DM) 30-600 MG per 12 hr tablet Take 2 tablets by mouth 2 (two) times daily as needed.     . fish oil-omega-3 fatty acids 1000 MG capsule Take 2 g by mouth daily as needed.     . multivitamin (THERAGRAN) tablet Take 1 tablet by mouth daily.      .  sildenafil (VIAGRA) 100 MG tablet Take 0.5-1 tablets (50-100 mg total) by mouth daily as needed for erectile dysfunction. 5 tablet 11   No current facility-administered medications on file prior to visit.    No Known Allergies  Family History  Problem Relation Age of Onset  . Diabetes Mother     type 2  . Cancer Maternal Grandmother     throat  . Heart disease Maternal Grandfather   . Diabetes Maternal Grandfather   . Colon polyps Neg Hx   . Depression Daughter     anxiety    Social History   Social History  . Marital Status: Married    Spouse Name: N/A  . Number of Children: N/A  . Years of Education: N/A   Social History Main Topics  . Smoking status: Former Smoker    Types: Cigarettes    Quit date: 08/30/1968  . Smokeless tobacco: Never Used     Comment: smoked in high school  . Alcohol Use: No     Comment: very little  . Drug Use: No  . Sexual Activity:    Partners: Female   Other Topics Concern  . None   Social History Narrative   Review of Systems - See HPI.  All other ROS are negative.  BP 132/91 mmHg  Pulse 87  Temp(Src) 98.1 F (36.7 C) (Oral)  Resp 16  Ht 5'  7" (1.702 m)  Wt 264 lb 6 oz (119.92 kg)  BMI 41.40 kg/m2  SpO2 97%  Physical Exam  Constitutional: He is oriented to person, place, and time and well-developed, well-nourished, and in no distress.  HENT:  Head: Normocephalic and atraumatic.  Right Ear: External ear normal.  Left Ear: External ear normal.  Nose: Nose normal.  Mouth/Throat: Oropharynx is clear and moist. No oropharyngeal exudate.  TM within normal limits  Eyes: Conjunctivae are normal.  Neck: Neck supple.  Cardiovascular: Normal rate, regular rhythm, normal heart sounds and intact distal pulses.   Pulmonary/Chest: Effort normal and breath sounds normal. No respiratory distress. He has no wheezes. He has no rales. He exhibits no tenderness.  Neurological: He is alert and oriented to person, place, and time.  Skin:  Skin is warm and dry. No rash noted.  Psychiatric: Affect normal.  Vitals reviewed.   No results found for this or any previous visit (from the past 2160 hour(s)).  Assessment/Plan: Acute bacterial bronchitis Rx Azithromycin. Increase fluids. Rest. Mucinex as directed. Follow-up if symptoms are not resolving.  SOBOE (shortness of breath on exertion) EKG without concerning findings. Exam unremarkable. Body mass index is 41.4 kg/(m^2). Suspect deconditioning is a big part of this issue but giving symptoms and family history, will obtain echocardiogram. Referral to Cardiology placed for further assessment.

## 2014-11-05 NOTE — Telephone Encounter (Signed)
Patient Name: David Esparza  DOB: 1950-07-23    Initial Comment caller states he is short of breath   Nurse Assessment  Nurse: Leilani Merl, RN, Heather Date/Time (Eastern Time): 11/05/2014 11:13:17 AM  Confirm and document reason for call. If symptomatic, describe symptoms. ---caller states that he is having SOB with exertion. This started a few of weeks ago, he has a cough and congestion for the last 2 weeks.  Has the patient traveled out of the country within the last 30 days? ---Not Applicable  Does the patient have any new or worsening symptoms? ---Yes  Will a triage be completed? ---Yes  Related visit to physician within the last 2 weeks? ---No  Does the PT have any chronic conditions? (i.e. diabetes, asthma, etc.) ---Yes  List chronic conditions. ---HTN     Guidelines    Guideline Title Affirmed Question Affirmed Notes  Cough - Acute Productive [1] Continuous (nonstop) coughing interferes with work or school AND [2] no improvement using cough treatment per Care Advice    Final Disposition User   See Physician within 24 Hours Stand[place], RN, Conservator, museum/gallery states that it has been a year since he had a check up and he needs some blood work drawn and he is fasting today so far. He is wondering if the blood work can be ordered now and he get the blood work ASAP before his appt. Please call him and let him know if he can get the bloodwork done soon or not.  Appt made with Dr. Einar Pheasant today for 5:15 pm   Referrals  REFERRED TO PCP OFFICE   Disagree/Comply: Comply

## 2014-11-07 ENCOUNTER — Telehealth: Payer: Self-pay | Admitting: Physician Assistant

## 2014-11-07 ENCOUNTER — Other Ambulatory Visit: Payer: Self-pay | Admitting: Physician Assistant

## 2014-11-07 DIAGNOSIS — Z125 Encounter for screening for malignant neoplasm of prostate: Secondary | ICD-10-CM

## 2014-11-07 DIAGNOSIS — R0602 Shortness of breath: Secondary | ICD-10-CM

## 2014-11-07 NOTE — Telephone Encounter (Signed)
Pt is scheduled for labs 11/10/14, cpe with [facility] 11/17/14. Pt asked about having [place] on heart completed at the [facility] location but I do not know what he is referring to.

## 2014-11-07 NOTE — Telephone Encounter (Signed)
His Echo will be at that location as discussed at visit.

## 2014-11-07 NOTE — Telephone Encounter (Signed)
Please inform patient that all labs are in (for SOB and physical). He can schedule a lab appointment to have drawn. He needs to schedule a Medicare Wellness/CPE with Dr. Charlett Blake or Myself in the next week or so.

## 2014-11-10 ENCOUNTER — Other Ambulatory Visit (INDEPENDENT_AMBULATORY_CARE_PROVIDER_SITE_OTHER): Payer: Medicare Other

## 2014-11-10 DIAGNOSIS — E559 Vitamin D deficiency, unspecified: Secondary | ICD-10-CM | POA: Diagnosis not present

## 2014-11-10 DIAGNOSIS — R0602 Shortness of breath: Secondary | ICD-10-CM

## 2014-11-10 DIAGNOSIS — E785 Hyperlipidemia, unspecified: Secondary | ICD-10-CM

## 2014-11-10 DIAGNOSIS — B9689 Other specified bacterial agents as the cause of diseases classified elsewhere: Secondary | ICD-10-CM | POA: Insufficient documentation

## 2014-11-10 DIAGNOSIS — J208 Acute bronchitis due to other specified organisms: Secondary | ICD-10-CM

## 2014-11-10 DIAGNOSIS — Z125 Encounter for screening for malignant neoplasm of prostate: Secondary | ICD-10-CM

## 2014-11-10 LAB — CBC WITH DIFFERENTIAL/PLATELET
BASOS ABS: 0 10*3/uL (ref 0.0–0.1)
BASOS PCT: 0.3 % (ref 0.0–3.0)
EOS PCT: 1.5 % (ref 0.0–5.0)
Eosinophils Absolute: 0.2 10*3/uL (ref 0.0–0.7)
HEMATOCRIT: 51.4 % (ref 39.0–52.0)
Hemoglobin: 17.7 g/dL — ABNORMAL HIGH (ref 13.0–17.0)
LYMPHS PCT: 15.8 % (ref 12.0–46.0)
Lymphs Abs: 2.2 10*3/uL (ref 0.7–4.0)
MCHC: 34.5 g/dL (ref 30.0–36.0)
MCV: 94.1 fl (ref 78.0–100.0)
MONOS PCT: 8.3 % (ref 3.0–12.0)
Monocytes Absolute: 1.2 10*3/uL — ABNORMAL HIGH (ref 0.1–1.0)
NEUTROS ABS: 10.2 10*3/uL — AB (ref 1.4–7.7)
Neutrophils Relative %: 74.1 % (ref 43.0–77.0)
PLATELETS: 212 10*3/uL (ref 150.0–400.0)
RBC: 5.46 Mil/uL (ref 4.22–5.81)
RDW: 13.2 % (ref 11.5–15.5)
WBC: 13.8 10*3/uL — ABNORMAL HIGH (ref 4.0–10.5)

## 2014-11-10 LAB — VITAMIN D 25 HYDROXY (VIT D DEFICIENCY, FRACTURES): VITD: 24.58 ng/mL — AB (ref 30.00–100.00)

## 2014-11-10 LAB — VITAMIN B12: Vitamin B-12: 611 pg/mL (ref 211–911)

## 2014-11-10 LAB — COMPREHENSIVE METABOLIC PANEL
ALBUMIN: 4.1 g/dL (ref 3.5–5.2)
ALK PHOS: 94 U/L (ref 39–117)
ALT: 93 U/L — ABNORMAL HIGH (ref 0–53)
AST: 52 U/L — ABNORMAL HIGH (ref 0–37)
BILIRUBIN TOTAL: 0.9 mg/dL (ref 0.2–1.2)
BUN: 20 mg/dL (ref 6–23)
CALCIUM: 10 mg/dL (ref 8.4–10.5)
CHLORIDE: 103 meq/L (ref 96–112)
CO2: 24 mEq/L (ref 19–32)
Creatinine, Ser: 1.02 mg/dL (ref 0.40–1.50)
GFR: 77.8 mL/min (ref >60.00)
GLUCOSE: 148 mg/dL — AB (ref 70–99)
POTASSIUM: 4 meq/L (ref 3.5–5.1)
Sodium: 140 mEq/L (ref 135–145)
Total Protein: 7.2 g/dL (ref 6.0–8.3)

## 2014-11-10 LAB — LIPID PANEL
CHOLESTEROL: 207 mg/dL — AB (ref 0–200)
HDL: 44.3 mg/dL (ref >39.00)
NONHDL: 162.48
TRIGLYCERIDES: 258 mg/dL — AB (ref 0.0–149.0)
Total CHOL/HDL Ratio: 5
VLDL: 51.6 mg/dL — ABNORMAL HIGH (ref 0.0–40.0)

## 2014-11-10 LAB — LDL CHOLESTEROL, DIRECT: Direct LDL: 115 mg/dL

## 2014-11-10 LAB — TSH: TSH: 2.18 u[IU]/mL (ref 0.35–4.50)

## 2014-11-10 LAB — PSA, MEDICARE: PSA: 0.57 ng/ml (ref 0.10–4.00)

## 2014-11-10 NOTE — Assessment & Plan Note (Signed)
Rx Azithromycin. Increase fluids. Rest. Mucinex as directed. Follow-up if symptoms are not resolving.

## 2014-11-10 NOTE — Assessment & Plan Note (Signed)
EKG without concerning findings. Exam unremarkable. Body mass index is 41.4 kg/(m^2). Suspect deconditioning is a big part of this issue but giving symptoms and family history, will obtain echocardiogram. Referral to Cardiology placed for further assessment.

## 2014-11-12 ENCOUNTER — Other Ambulatory Visit: Payer: Self-pay

## 2014-11-12 ENCOUNTER — Ambulatory Visit (HOSPITAL_COMMUNITY): Payer: Medicare Other | Attending: Cardiology

## 2014-11-12 DIAGNOSIS — I071 Rheumatic tricuspid insufficiency: Secondary | ICD-10-CM | POA: Insufficient documentation

## 2014-11-12 DIAGNOSIS — I5189 Other ill-defined heart diseases: Secondary | ICD-10-CM | POA: Insufficient documentation

## 2014-11-12 DIAGNOSIS — I77810 Thoracic aortic ectasia: Secondary | ICD-10-CM | POA: Insufficient documentation

## 2014-11-12 DIAGNOSIS — I517 Cardiomegaly: Secondary | ICD-10-CM | POA: Insufficient documentation

## 2014-11-12 DIAGNOSIS — R0602 Shortness of breath: Secondary | ICD-10-CM

## 2014-11-13 ENCOUNTER — Telehealth: Payer: Self-pay | Admitting: *Deleted

## 2014-11-13 NOTE — Telephone Encounter (Signed)
-----   Message from Brunetta Jeans, PA-C sent at 11/11/2014  8:32 AM EDT ----- Lab results are in.  (1) PSA and TSH testing look good. (2) Cholesterol panel shows LDL is borderline at 115 and TGL are elevated in the 250s. Giving age, risk factors and high TGL levels, recommend we start Lipitor 10 mg daily. Increase exercise and avoiding alcohol and refined sugars will also help these levels. (3) Liver enzymes remain mildly elevated, increased from last check. Again recommend avoidance of alcohol and tylenol-containing products. Recommend [place] of his liver to assess for fatty liver. (4) Vitamin D is just slightly low. Recommend daily 1000 unit OTC Vitamin D supplement. (5) Lastly his WBC count was mildly elevated, likely due to infection. We will recheck this level when I see him at his physical  In a couple of weeks.

## 2014-11-13 NOTE — Telephone Encounter (Signed)
Spoke with the pt and informed him of recent lab results and note.  Pt verbalized understanding.   Asked the pt if he would like to start the Lipitor for his cholesterol and to have the [place] for his liver.  Pt stated that he will wait on those thing until he sees Cody at his appt on Monday (11-17-14 @ 8:30am).   They will discuss everything then.//AB/CMA

## 2014-11-14 ENCOUNTER — Encounter: Payer: Self-pay | Admitting: Behavioral Health

## 2014-11-14 ENCOUNTER — Telehealth: Payer: Self-pay | Admitting: Behavioral Health

## 2014-11-14 NOTE — Telephone Encounter (Signed)
Pre-Visit Call completed with patient and chart updated.   Pre-Visit Info documented in Specialty Comments under [employer].    

## 2014-11-16 ENCOUNTER — Other Ambulatory Visit: Payer: Self-pay | Admitting: Physician Assistant

## 2014-11-16 DIAGNOSIS — R0602 Shortness of breath: Secondary | ICD-10-CM

## 2014-11-17 ENCOUNTER — Telehealth: Payer: Self-pay | Admitting: Behavioral Health

## 2014-11-17 ENCOUNTER — Encounter: Payer: Medicare Other | Admitting: Physician Assistant

## 2014-11-17 NOTE — Telephone Encounter (Signed)
Pre-visit call completed on 11/14/14; spoke with the patient and there has not been any changes. Appointment confirmed for 11/18/14 at 8:30 AM.

## 2014-11-18 ENCOUNTER — Ambulatory Visit (INDEPENDENT_AMBULATORY_CARE_PROVIDER_SITE_OTHER): Payer: Medicare Other | Admitting: Physician Assistant

## 2014-11-18 ENCOUNTER — Encounter: Payer: Self-pay | Admitting: Physician Assistant

## 2014-11-18 VITALS — BP 140/82 | HR 88 | Temp 97.7°F | Resp 16 | Ht 67.0 in | Wt 265.2 lb

## 2014-11-18 DIAGNOSIS — R0602 Shortness of breath: Secondary | ICD-10-CM | POA: Diagnosis not present

## 2014-11-18 DIAGNOSIS — D72829 Elevated white blood cell count, unspecified: Secondary | ICD-10-CM | POA: Diagnosis not present

## 2014-11-18 DIAGNOSIS — I10 Essential (primary) hypertension: Secondary | ICD-10-CM

## 2014-11-18 DIAGNOSIS — E785 Hyperlipidemia, unspecified: Secondary | ICD-10-CM

## 2014-11-18 DIAGNOSIS — Z0000 Encounter for general adult medical examination without abnormal findings: Secondary | ICD-10-CM | POA: Diagnosis not present

## 2014-11-18 DIAGNOSIS — R748 Abnormal levels of other serum enzymes: Secondary | ICD-10-CM

## 2014-11-18 DIAGNOSIS — R739 Hyperglycemia, unspecified: Secondary | ICD-10-CM

## 2014-11-18 LAB — CBC WITH DIFFERENTIAL/PLATELET
BASOS ABS: 0 10*3/uL (ref 0.0–0.1)
Basophils Relative: 0.4 % (ref 0.0–3.0)
Eosinophils Absolute: 0.3 10*3/uL (ref 0.0–0.7)
Eosinophils Relative: 3.4 % (ref 0.0–5.0)
HCT: 52.3 % — ABNORMAL HIGH (ref 39.0–52.0)
Hemoglobin: 17.7 g/dL — ABNORMAL HIGH (ref 13.0–17.0)
LYMPHS ABS: 2.1 10*3/uL (ref 0.7–4.0)
Lymphocytes Relative: 26 % (ref 12.0–46.0)
MCHC: 33.8 g/dL (ref 30.0–36.0)
MCV: 95.7 fl (ref 78.0–100.0)
MONOS PCT: 8.2 % (ref 3.0–12.0)
Monocytes Absolute: 0.7 10*3/uL (ref 0.1–1.0)
NEUTROS ABS: 5.1 10*3/uL (ref 1.4–7.7)
NEUTROS PCT: 62 % (ref 43.0–77.0)
PLATELETS: 224 10*3/uL (ref 150.0–400.0)
RBC: 5.46 Mil/uL (ref 4.22–5.81)
RDW: 13.3 % (ref 11.5–15.5)
WBC: 8.2 10*3/uL (ref 4.0–10.5)

## 2014-11-18 LAB — HEMOGLOBIN A1C: HEMOGLOBIN A1C: 6.1 % (ref 4.6–6.5)

## 2014-11-18 NOTE — Patient Instructions (Addendum)
Please go to the lab for blood work.  You will be contacted for an [place] of your liver. I will call with results. Again you will be contacted by Cardiology for further assessment.  Please continue medications as directed. Always take your BP medications before appointment.  Please consider scheduling an appointment with our counselors.  Please follow the diet guidelines below.  Fat and Cholesterol Restricted Diet High levels of fat and cholesterol in your blood may lead to various health problems, such as diseases of the heart, blood vessels, gallbladder, liver, and pancreas. Fats are concentrated sources of energy that come in various forms. Certain types of fat, including saturated fat, may be harmful in excess. Cholesterol is a substance needed by your body in small amounts. Your body makes all the cholesterol it needs. Excess cholesterol comes from the food you eat. When you have high levels of cholesterol and saturated fat in your blood, health problems can develop because the excess fat and cholesterol will gather along the walls of your blood vessels, causing them to narrow. Choosing the right foods will help you control your intake of fat and cholesterol. This will help keep the levels of these substances in your blood within normal limits and reduce your risk of disease. WHAT IS MY PLAN? Your health care provider recommends that you:  Get no more than __________ % of the total calories in your daily diet from fat.  Limit your intake of saturated fat to less than ______% of your total calories each day.  Limit the amount of cholesterol in your diet to less than _________mg per day. WHAT TYPES OF FAT SHOULD I CHOOSE?  Choose healthy fats more often. Choose monounsaturated and polyunsaturated fats, such as olive and canola oil, flaxseeds, walnuts, almonds, and seeds.  Eat more omega-3 fats. Good choices include salmon, mackerel, sardines, tuna, flaxseed oil, and ground flaxseeds. Aim to  eat fish at least two times a week.  Limit saturated fats. Saturated fats are primarily found in animal products, such as meats, butter, and cream. Plant sources of saturated fats include palm oil, palm kernel oil, and coconut oil.  Avoid foods with partially hydrogenated oils in them. These contain trans fats. Examples of foods that contain trans fats are stick margarine, some tub margarines, cookies, crackers, and other baked goods. WHAT GENERAL GUIDELINES DO I NEED TO FOLLOW? These guidelines for healthy eating will help you control your intake of fat and cholesterol:  Check food labels carefully to identify foods with trans fats or high amounts of saturated fat.  Fill one half of your plate with vegetables and green salads.  Fill one fourth of your plate with whole grains. Look for the word "whole" as the first word in the ingredient list.  Fill one fourth of your plate with lean protein foods.  Limit fruit to two servings a day. Choose fruit instead of juice.  Eat more foods that contain soluble fiber. Examples of foods that contain this type of fiber are apples, broccoli, carrots, beans, peas, and barley. Aim to get 20-30 g of fiber per day.  Eat more home-cooked food and less restaurant, buffet, and fast food.  Limit or avoid alcohol.  Limit foods high in starch and sugar.  Limit fried foods.  Cook foods using methods other than frying. Baking, boiling, grilling, and broiling are all great options.  Lose weight if you are overweight. Losing just 5-10% of your initial body weight can help your overall health and prevent diseases  such as diabetes and heart disease. WHAT FOODS CAN I EAT? Grains Whole grains, such as whole wheat or whole grain breads, crackers, cereals, and pasta. Unsweetened oatmeal, bulgur, barley, quinoa, or brown rice. Corn or whole wheat flour tortillas. Vegetables Fresh or frozen vegetables (raw, steamed, roasted, or grilled). Green salads. Fruits All  fresh, canned (in natural juice), or frozen fruits. Meat and Other Protein Products Ground beef (85% or leaner), grass-fed beef, or beef trimmed of fat. Skinless chicken or [place]. Ground chicken or [place]. Pork trimmed of fat. All fish and seafood. Eggs. Dried beans, peas, or lentils. Unsalted nuts or seeds. Unsalted canned or dry beans. Dairy Low-fat dairy products, such as skim or 1% milk, 2% or reduced-fat cheeses, low-fat ricotta or cottage cheese, or plain low-fat yogurt. Fats and Oils Tub margarines without trans fats. Light or reduced-fat mayonnaise and salad dressings. Avocado. Olive, canola, sesame, or safflower oils. Natural peanut or almond butter (choose ones without added sugar and oil). The items listed above may not be a complete list of recommended foods or beverages. Contact your dietitian for more options. WHAT FOODS ARE NOT RECOMMENDED? Grains White bread. White pasta. White rice. Cornbread. Bagels, pastries, and croissants. Crackers that contain trans fat. Vegetables White potatoes. Corn. Creamed or fried vegetables. Vegetables in a cheese sauce. Fruits Dried fruits. Canned fruit in light or heavy syrup. Fruit juice. Meat and Other Protein Products Fatty cuts of meat. Ribs, chicken wings, bacon, sausage, bologna, salami, chitterlings, fatback, hot dogs, bratwurst, and packaged luncheon meats. Liver and organ meats. Dairy Whole or 2% milk, cream, half-and-half, and cream cheese. Whole milk cheeses. Whole-fat or sweetened yogurt. Full-fat cheeses. Nondairy creamers and whipped toppings. Processed cheese, cheese spreads, or cheese curds. Sweets and Desserts Corn syrup, sugars, honey, and molasses. Candy. Jam and jelly. Syrup. Sweetened cereals. Cookies, pies, cakes, donuts, muffins, and ice cream. Fats and Oils Butter, stick margarine, lard, shortening, ghee, or bacon fat. Coconut, palm kernel, or palm oils. Beverages Alcohol. Sweetened drinks (such as sodas, lemonade, and  fruit drinks or punches). The items listed above may not be a complete list of foods and beverages to avoid. Contact your dietitian for more information.   This information is not intended to replace advice given to you by your health care provider. Make sure you discuss any questions you have with your health care provider.   Document Released: 12/27/2004 Document Revised: 01/17/2014 Document Reviewed: 03/27/2013 Elsevier Interactive Patient Education [employer].

## 2014-11-18 NOTE — Progress Notes (Signed)
Pre visit review using our clinic review tool, if applicable. No additional management support is needed unless otherwise documented below in the visit note/SLS  

## 2014-11-18 NOTE — Progress Notes (Signed)
Subjective:    David Esparza is a 64 y.o. male who presents for a welcome to Medicare exam.   Cardiac risk factors: advanced age (older than 77 for men, 33 for women), dyslipidemia, hypertension, male gender, obesity (BMI >= 30 kg/m2) and sedentary lifestyle.  Depression Screen (Note: if answer to either of the following is "Yes", a more complete depression screening is indicated)  Q1: Over the past two weeks, have you felt down, depressed or hopeless? yes Q2: Over the past two weeks, have you felt little interest or pleasure in doing things? no  Activities of Daily Living In your present state of health, do you have any difficulty performing the following activities?:  Preparing food and eating?: No Bathing yourself: No Getting dressed: No Using the toilet:No Moving around from place to place: No In the past year have you fallen or had a near fall?:No   Current exercise habits: None  currently Dietary issues discussed: Body mass index is 41.53 kg/(m^2). Diet and exercise regimen will be discussed today.  Hearing difficulties: No Safe in current home environment: no  The following portions of the patient's history were reviewed and updated as appropriate: allergies, current medications, past family history, past medical history, past social history, past surgical history and problem list.  Review of Systems A comprehensive review of systems was negative except for: Respiratory: positive for dyspnea on exertion    Objective:     Vision by Snellen chart: right eye:20/20, left eye:20/20 Blood pressure 140/82, pulse 88, temperature 97.7 F (36.5 C), temperature source Oral, resp. rate 16, height 5\' 7"  (1.702 m), weight 265 lb 4 oz (120.317 kg), SpO2 96 %. Body mass index is 41.53 kg/(m^2).   General appearance: alert, cooperative, appears stated age and no distress Head: Normocephalic, without obvious abnormality, atraumatic Eyes: conjunctivae/corneas clear. PERRL, EOM's  intact. Fundi benign. Ears: normal TM's and external ear canals both ears Nose: Nares normal. Septum midline. Mucosa normal. No drainage or sinus tenderness. Throat: lips, mucosa, and tongue normal; teeth and gums normal Lungs: clear to auscultation bilaterally Heart: regular rate and rhythm, S1, S2 normal, no murmur, click, rub or gallop Abdomen: soft, non-tender; bowel sounds normal; no masses,  no organomegaly Extremities: extremities normal, atraumatic, no cyanosis or edema Pulses: 2+ and symmetric Skin: Skin color, texture, turgor normal. No rashes or lesions Lymph nodes: Cervical, supraclavicular, and axillary nodes normal. Neurologic: Alert and oriented X 3, normal strength and tone. Normal symmetric reflexes. Normal coordination and gait    Assessment:    (1) Welcome to Medicare Exam  (2) Obesity  (3) Dyspnea on Exertion  (4) Hypertension   (5) Elevated Liver Enzymes  (6) Leukocytosis   (7) Hyperlipidemia     Plan:     (1) During the course of the visit the patient was educated and counseled about appropriate screening and preventive services including:   Influenza vaccine  Td vaccine  Screening electrocardiogram  Prostate cancer screening  Colorectal cancer screening  Nutrition counseling   (2) Body mass index is 41.53 kg/(m^2). Discussed appropriate diet and exercise measures to promote weight loss. Handouts given.  (3) Echocardiogram results reviewed with patient. Appointment is scheduled with Cardiology for assessment and stress testing. Suspect conditioning is an issue as patient does not exercise and has put on 60 pounds over the past year. Weight loss discussed with patient and regimen will be started.  (4) Taking medications as directed. BP on recheck mildly elevated but acceptable for age and lack of co  morbidities. Will continue current regimen. Renal function stable on review of labs. DASH diet encouraged.  (5) Mild chronic elevation. Suspect fatty  liver. Will obtain US of the abdomen to further assess.  (6) Noted on routine labs. Patient sick with bronchitis at time of labs. This has since resolved. Will repeat CBC to ensure resolution of leukocytosis.  (7) Mildly elevated TGL and LDL. Discussed TLC versus starting lipitor. Patient to begin diet and exercise regimen. Will restart his Omega 3 supplements. Will again check [place] of liver. Will recheck in 3 months.   Patient Instructions (the written plan) was given to the patient.

## 2014-11-21 ENCOUNTER — Ambulatory Visit (HOSPITAL_BASED_OUTPATIENT_CLINIC_OR_DEPARTMENT_OTHER)
Admission: RE | Admit: 2014-11-21 | Discharge: 2014-11-21 | Disposition: A | Discharge status: Home / Self Care | Payer: Medicare Other | Source: Ambulatory Visit | Attending: Physician Assistant | Admitting: Physician Assistant

## 2014-11-21 ENCOUNTER — Telehealth: Payer: Self-pay | Admitting: *Deleted

## 2014-11-21 DIAGNOSIS — R7989 Other specified abnormal findings of blood chemistry: Secondary | ICD-10-CM | POA: Diagnosis not present

## 2014-11-21 DIAGNOSIS — N281 Cyst of kidney, acquired: Secondary | ICD-10-CM | POA: Insufficient documentation

## 2014-11-21 DIAGNOSIS — K760 Fatty (change of) liver, not elsewhere classified: Secondary | ICD-10-CM | POA: Insufficient documentation

## 2014-11-21 DIAGNOSIS — R748 Abnormal levels of other serum enzymes: Secondary | ICD-10-CM

## 2014-11-21 MED ORDER — ATORVASTATIN CALCIUM 10 MG PO TABS: 10.0000 mg | ORAL_TABLET | Freq: Every day | ORAL | Status: DC

## 2014-11-21 NOTE — Telephone Encounter (Signed)
Patient informed, understood & agreed; new Rx to pharmacy/[facility]  

## 2014-11-21 NOTE — Telephone Encounter (Signed)
-----   Message from Brunetta Jeans, PA-C sent at 11/21/2014 12:53 PM EST ----- US reveals fatty liver as suspected. Do feel we should start lipitor 10 mg daily. Ok to send in Rx (30 days with 2 refills). [place] also reveals cyst on each kidney bilaterally but does not seem worrisome. Will recheck in 1 year.

## 2014-12-05 ENCOUNTER — Other Ambulatory Visit: Payer: Self-pay | Admitting: Physician Assistant

## 2014-12-05 NOTE — Telephone Encounter (Signed)
Will defer further refills of patient's medications to PCP  

## 2015-02-18 ENCOUNTER — Other Ambulatory Visit: Payer: Self-pay | Admitting: Physician Assistant

## 2015-02-19 ENCOUNTER — Other Ambulatory Visit: Payer: Self-pay | Admitting: Physician Assistant

## 2015-02-26 ENCOUNTER — Other Ambulatory Visit: Payer: Self-pay | Admitting: Family Medicine

## 2015-02-26 NOTE — Telephone Encounter (Signed)
Requesting: [facility] Contract  None UDS  None Last OV   11/18/2014 Last Refill   #30 with 0 refills on 11/05/2014  Please Advise PCP is listed as Dr. Charlett Blake but patient most recently been seen by Raiford Noble PA.  Advise please

## 2015-02-27 NOTE — Telephone Encounter (Signed)
Faxed [facility] to [place]

## 2015-03-03 ENCOUNTER — Telehealth: Payer: Self-pay | Admitting: General Practice

## 2015-03-03 NOTE — Telephone Encounter (Signed)
Received a fax from [facility] for [place] on [address]. Called [phone] [facility]) as requested and PA was approved Confirmation # [id number]  , from 12-03-2014 until 03/02/2016. Fax will be sent to our office.

## 2015-03-19 ENCOUNTER — Other Ambulatory Visit: Payer: Self-pay | Admitting: Physician Assistant

## 2015-06-18 ENCOUNTER — Encounter: Payer: Self-pay | Admitting: Medical

## 2015-06-18 ENCOUNTER — Ambulatory Visit (INDEPENDENT_AMBULATORY_CARE_PROVIDER_SITE_OTHER): Payer: Medicare Other | Admitting: Medical

## 2015-06-18 VITALS — BP 140/86 | HR 74 | Temp 98.1°F | Ht 67.0 in | Wt 261.2 lb

## 2015-06-18 DIAGNOSIS — M1000 Idiopathic gout, unspecified site: Secondary | ICD-10-CM | POA: Diagnosis not present

## 2015-06-18 DIAGNOSIS — M109 Gout, unspecified: Secondary | ICD-10-CM

## 2015-06-18 MED ORDER — PREDNISONE 20 MG PO TABS: ORAL_TABLET | ORAL | Status: DC

## 2015-06-18 MED ORDER — COLCHICINE 0.6 MG PO TABS: 0.6000 mg | ORAL_TABLET | Freq: Every day | ORAL | Status: DC

## 2015-06-18 NOTE — Progress Notes (Signed)
Pre visit review using our clinic review tool, if applicable. No additional management support is needed unless otherwise documented below in the visit note. 

## 2015-06-18 NOTE — Progress Notes (Signed)
Subjective:    Patient ID: David Esparza, male    DOB: [birth year], 65 y.o.   MRN: [id number]  HPI  Pt in with some rt foot pain. Pain has been going on for one week. Pt has been on allopurinol. He states he started colchicine 2 days ago but it has not stopped the pain as fast as other times. But has decreased pain some.    Review of Systems  Constitutional: Negative for chills and fatigue.  Respiratory: Negative for cough, chest tightness, shortness of breath and wheezing.   Musculoskeletal:       Rt great toe pain.  Skin: Positive for rash.       Faint pinkish red at base of rt great toe.  Hematological: Negative for adenopathy. Does not bruise/bleed easily.   Past Medical History  Diagnosis Date  . Chicken pox as a child  . Mumps as a child  . Measles as a child  . Gout   . Hypertension   . Obese   . Fatigue   . Gout   . Acute bronchitis 08/16/2010  . Nocturia 08/16/2010  . Insomnia 08/16/2010  . Preventative health care 09/06/2011  . Sleep apnea 09/06/2011  . Anxiety 09/06/2011  . Hyperglycemia 09/06/2011  . Abnormal LFTs 09/06/2011  . Hyperlipidemia 09/06/2011  . Low testosterone 02/28/2012  . Unspecified vitamin D deficiency 02/28/2012  . Knee pain, acute 02/28/2012  . Grief reaction 09/09/2012     Social History   Social History  . Marital Status: Married    Spouse Name: N/A  . Number of Children: N/A  . Years of Education: N/A   Occupational History  . Not on file.   Social History Main Topics  . Smoking status: Former Smoker    Types: Cigarettes    Quit date: 08/30/1968  . Smokeless tobacco: Never Used     Comment: smoked in high school  . Alcohol Use: No     Comment: very little  . Drug Use: No  . Sexual Activity:    Partners: Female   Other Topics Concern  . Not on file   Social History Narrative    Past Surgical History  Procedure Laterality Date  . Intestines sewn  65 yrs old    shot once made 6 holes  . Tonsillectomy  as a child  .  Vasectomy  55 yrs old  . Colonoscopy  09/16/10    Dr. Deatra Ina: moderate diverticulosis sigmoid and descending colon, o/w normal: repeat 10 yrs.    Family History  Problem Relation Age of Onset  . Diabetes Mother     type 2  . Cancer Maternal Grandmother     throat  . Heart disease Maternal Grandfather   . Diabetes Maternal Grandfather   . Colon polyps Neg Hx   . Depression Daughter     anxiety    No Known Allergies  Current Outpatient Prescriptions on File Prior to Visit  Medication Sig Dispense Refill  . allopurinol (ZYLOPRIM) 300 MG tablet TAKE 1 TABLET BY MOUTH DAILY. 30 tablet 1  . ALPRAZolam (XANAX) 1 MG tablet TAKE 1 TABLET BY MOUTH TWICE DAILY AS NEEDED FOR SLEEP OR ANXIETY 30 tablet 0  . amLODipine (NORVASC) 5 MG tablet TAKE 1 TABLET (5 MG TOTAL) BY MOUTH DAILY. 30 tablet 5  . atorvastatin (LIPITOR) 10 MG tablet TAKE 1 TABLET (10 MG TOTAL) BY MOUTH DAILY. 30 tablet 0  . b complex vitamins capsule Take 1 capsule by mouth every  other day.    . BELSOMRA 20 MG TABS TAKE 1 TABLET BY MOUTH AT BEDTIME 30 tablet 2  . dextromethorphan-guaiFENesin (MUCINEX DM) 30-600 MG per 12 hr tablet Take 2 tablets by mouth 2 (two) times daily as needed.     . fish oil-omega-3 fatty acids 1000 MG capsule Take 2 g by mouth daily as needed.     Marland Kitchen losartan-hydrochlorothiazide (HYZAAR) 100-25 MG tablet TAKE 1 TABLET BY MOUTH DAILY. 30 tablet 5  . multivitamin (THERAGRAN) tablet Take 1 tablet by mouth daily.      . sildenafil (VIAGRA) 100 MG tablet Take 0.5-1 tablets (50-100 mg total) by mouth daily as needed for erectile dysfunction. 5 tablet 11   No current facility-administered medications on file prior to visit.    BP 140/86 mmHg  Pulse 74  Temp(Src) 98.1 F (36.7 C) (Oral)  Ht 5\' 7"  (1.702 m)  Wt 261 lb 3.2 oz (118.48 kg)  BMI 40.90 kg/m2  SpO2 96%       Objective:   Physical Exam  General- No acute distress. Pleasant patient. Rt foot- faint warmth and tender at the  rt great toe  joint(no redness extending up foot. No lymphadenopathy.        Assessment & Plan:  For your gout pain I refilled your colchicine. You report some improvement but not as fast as prior flares. So If by this weekend you have not experienced complete relief will make 3 day taper of prednisone available . While on prednisone limit sugar intake.(on review of chart had some borderline sugar levels in the past).  Follow up in 7 days or as needed  Khing Belcher, Percell Miller, [employer]

## 2015-06-18 NOTE — Patient Instructions (Addendum)
For your gout pain I refilled your colchicine. You report some improvement but not as fast as prior flares. So If by this weekend you have not experienced complete relief will make 3 day taper of prednisone available . While on prednisone limit sugar intake.(on review of chart had some borderline sugar levels in the past).  Follow up in 7 days or as needed

## 2015-06-20 ENCOUNTER — Other Ambulatory Visit: Payer: Self-pay | Admitting: Medical

## 2015-06-22 NOTE — Telephone Encounter (Signed)
Does he have any pain presently. What level of pain. Very residual pain or severe. What is going on.

## 2015-06-22 NOTE — Telephone Encounter (Signed)
Please advise if pt can have a refill of prednisone.

## 2015-06-23 NOTE — Telephone Encounter (Signed)
Spoke with pt and he states he has not been in any pain and he feels better since he took the medication. Pt did not have any further questions.

## 2015-08-15 ENCOUNTER — Other Ambulatory Visit: Payer: Self-pay | Admitting: Physician Assistant

## 2016-04-11 IMAGING — US US ABDOMEN COMPLETE
1 series · 14 of 25 positions shown · non-contrast
Comparison: None.

CLINICAL DATA: Elevated LFTs

EXAM:
ULTRASOUND ABDOMEN COMPLETE

[Series 1: us abdomen complete · 0.21mm/px · 14 of 74 slices shown]
[im 1/74]
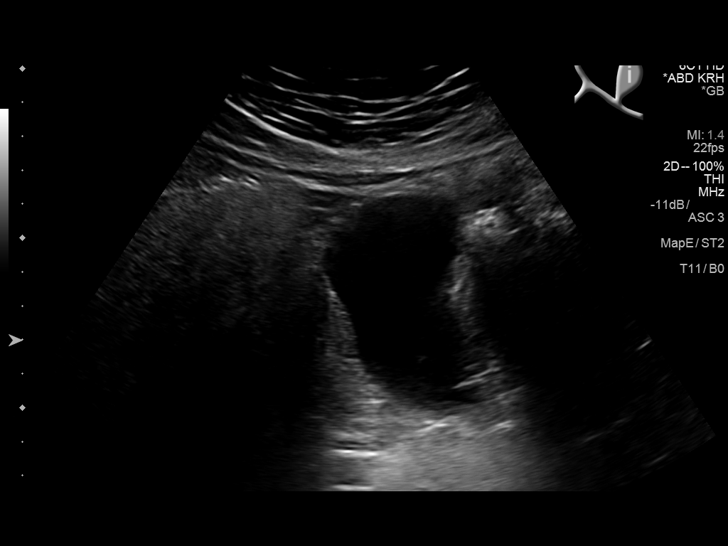
[im 7/74]
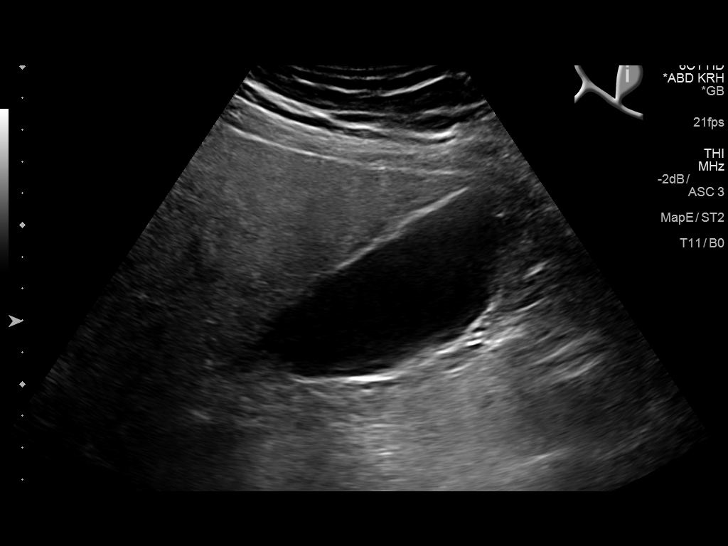
[im 13/74]
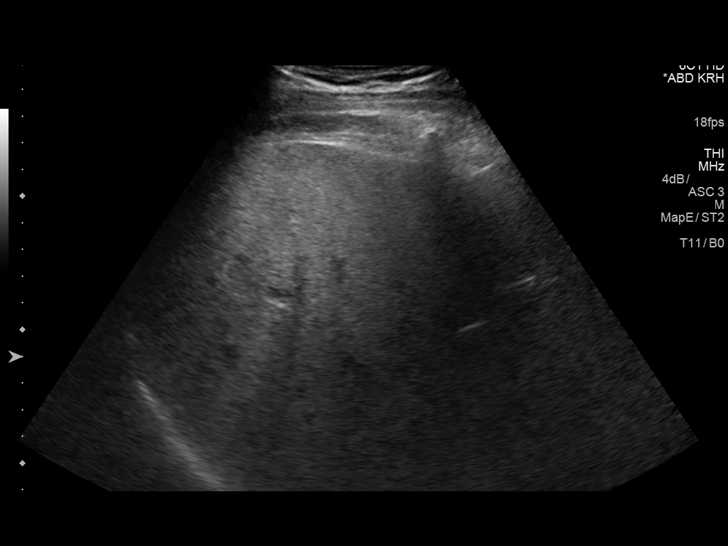
[im 19/74]
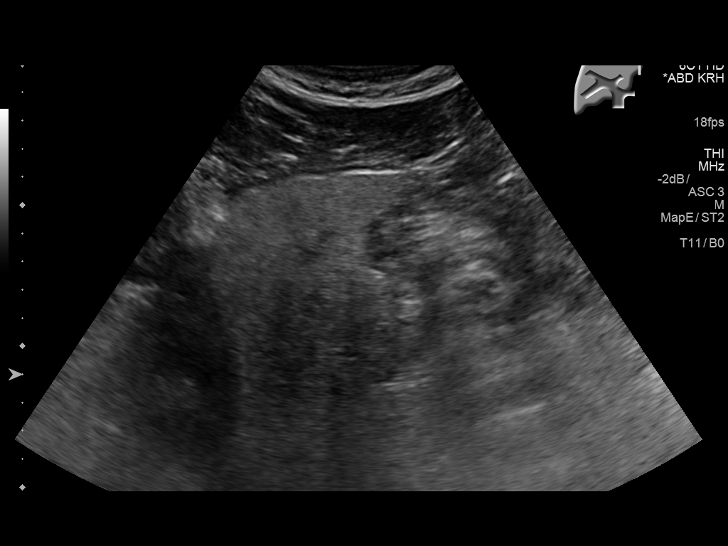
[im 25/74]
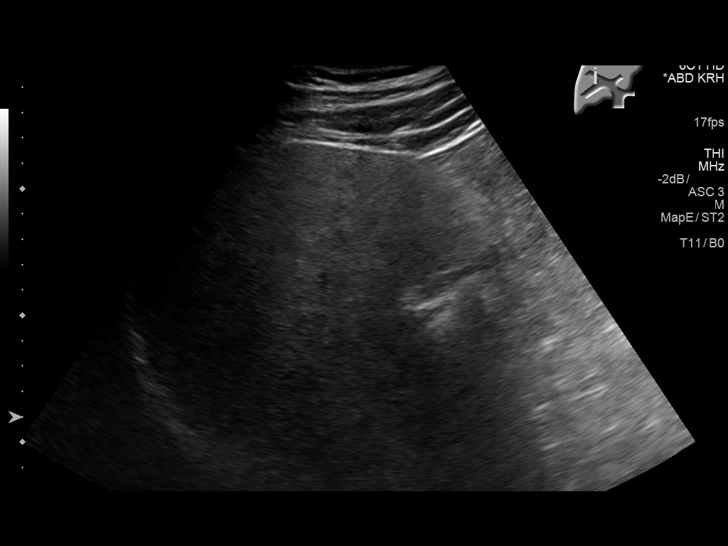
[im 28/74]
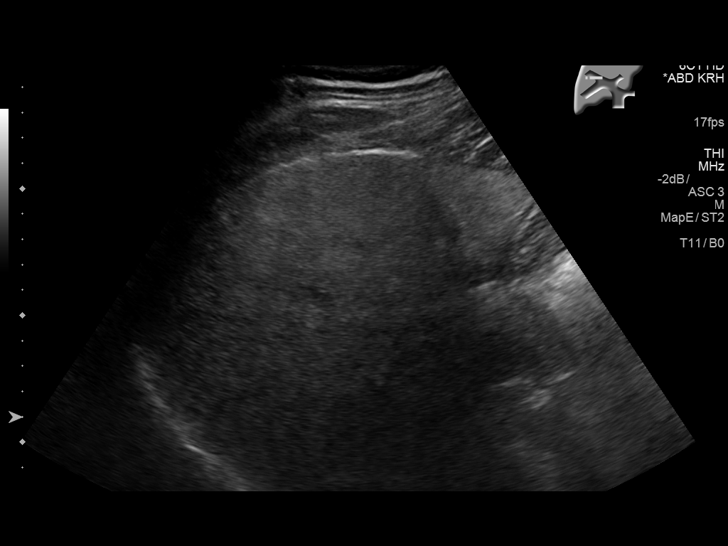
[im 34/74]
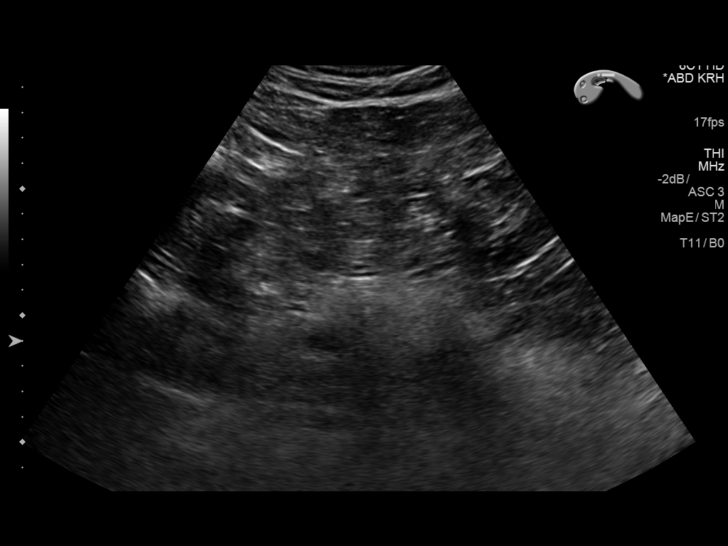
[im 40/74]
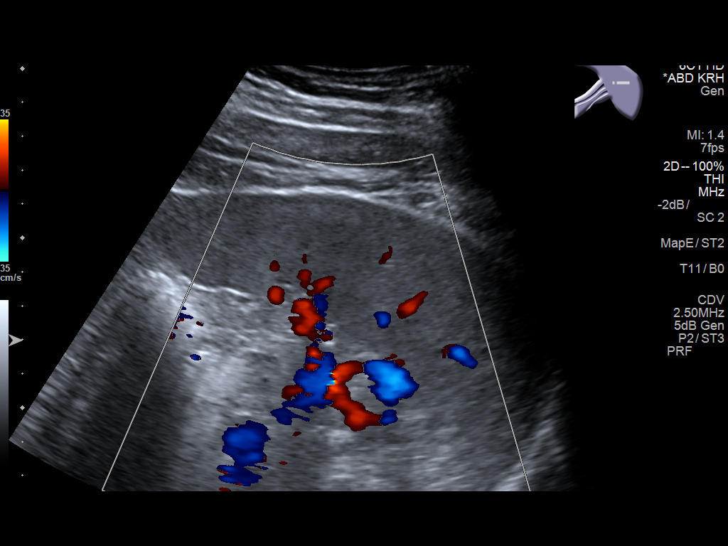
[im 46/74]
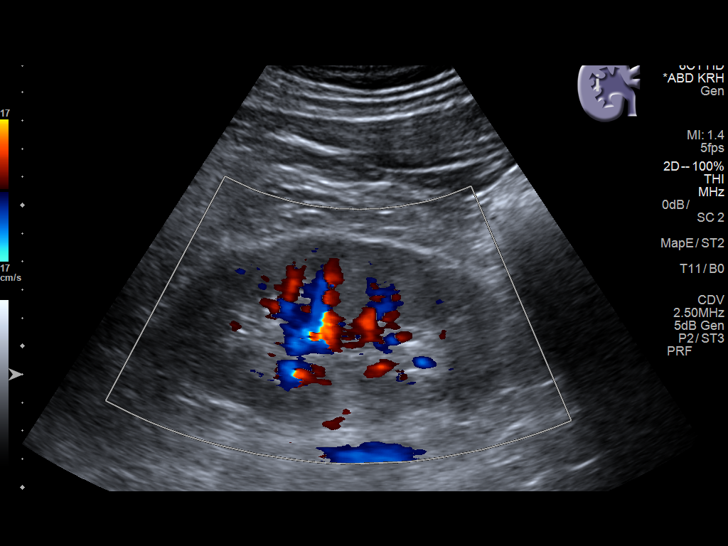
[im 49/74]
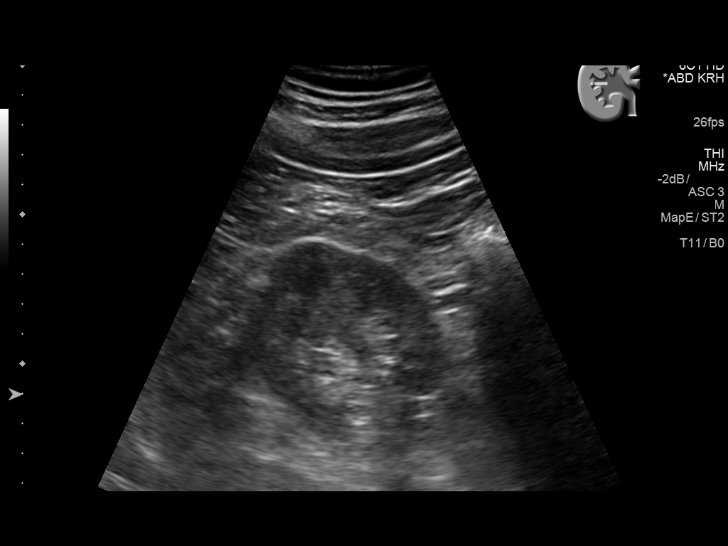
[im 55/74]
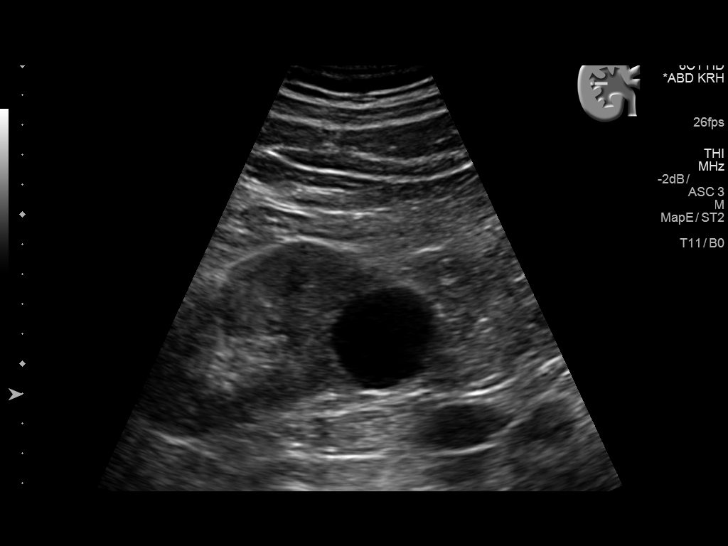
[im 61/74]
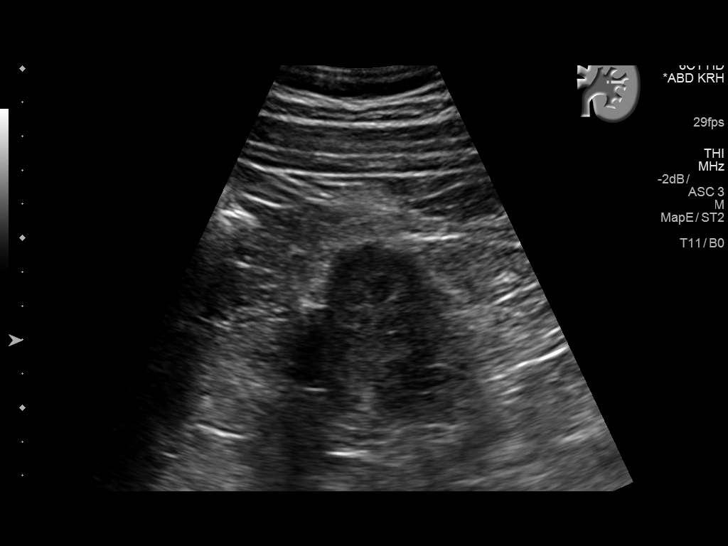
[im 67/74]
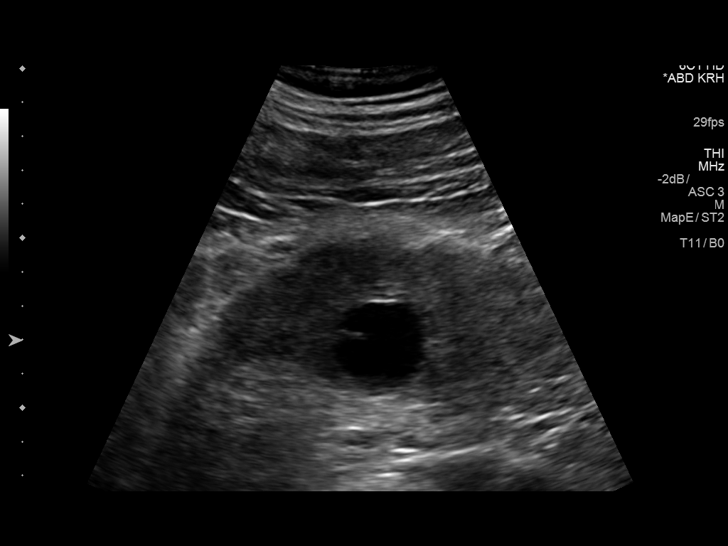
[im 74/74]
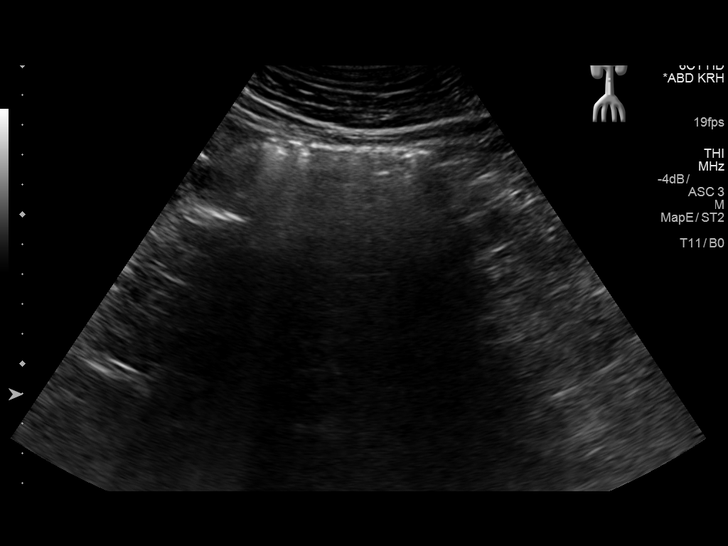

[14 of 25 positions shown; findings below may reference images not displayed]

FINDINGS: Gallbladder: No gallstones, gallbladder wall thickening, or
pericholecystic fluid. Negative sonographic Murphy's sign.

Common bile duct: Diameter: 2 mm

Liver: Hyperechoic hepatic parenchyma, suggesting hepatic steatosis.
No focal hepatic lesion is seen.

IVC: Poorly visualized.

Pancreas: Poorly visualized due to overlying bowel gas.

Spleen: Size and appearance within normal limits.

Right Kidney: Length: 12.0 cm. 3.7 x 3.8 x 3.2 cm lower pole cyst,
simple. No hydronephrosis.

Left Kidney: Length: 11.2 cm. 2.9 x 2.6 x 2.4 cm interpolar cyst
with a single thin septation. No hydronephrosis.

Abdominal aorta: No aneurysm visualized.

Other findings: None.
IMPRESSION: Hepatic steatosis.

Bilateral renal cysts, minimally complex on the left, as above.

## 2016-06-27 ENCOUNTER — Telehealth: Payer: Self-pay | Admitting: Family Medicine

## 2016-06-27 DIAGNOSIS — N529 Male erectile dysfunction, unspecified: Secondary | ICD-10-CM

## 2016-06-27 NOTE — Telephone Encounter (Signed)
°  Relation to BW:[facility] Call back [place]: [place], [place] - [place] [phone] (Phone) [phone] (Fax)     Reason for call:  Patient requesting a refill sildenafil (VIAGRA) 100 MG tablet

## 2016-06-27 NOTE — Telephone Encounter (Signed)
Ok to refill 30 day supply of Viagra

## 2016-06-27 NOTE — Telephone Encounter (Signed)
Last Office visit was on 03/18/2013 Next appt is scheduled for 07/04/2016 Since being so long since being seen and upcoming appt/ok to refill 30 days?/

## 2016-06-28 MED ORDER — SILDENAFIL CITRATE 100 MG PO TABS: 50.0000 mg | ORAL_TABLET | Freq: Every day | ORAL | 0 refills | Status: DC | PRN

## 2016-06-28 NOTE — Telephone Encounter (Signed)
Refill done.  

## 2016-07-01 NOTE — Progress Notes (Signed)
Subjective:   David Esparza is a 66 y.o. male who presents for an Initial Medicare Annual Wellness Visit.  The Patient was informed that the wellness visit is to identify future health risk and educate and initiate measures that can reduce risk for increased disease through the lifespan.   Describes health as fair, good or great? "I would say it's good except I need to lose some weight."  He is still working, self-employed in [employer].   He lost his wife in  2014, and remarried 2016. His new wife is a Fish farm manager and they are working together on diet and weight loss.  He has stopped taking/ran out of several of his maintenance meds: allopurinol, amlodipine, and atorvastatin. He also has not used Xanax or colchicine in quite some time. He has not had any gout flares.  Review of Systems    No ROS.  Medicare Wellness Visit. Additional risk factors are reflected in the social history.  Cardiac Risk Factors include: dyslipidemia;advanced age (>104men, >106 women);male gender;hypertension;obesity (BMI >30kg/m2)  Sleep patterns: no sleep issues, gets up 1 times nightly to void and sleeps 4-10 hours nightly. Tends to sleep less when he is working on a big project at work, and then sleeps more when he is more relaxed. He has never had a set sleep pattern. He used Belsomra for a bit, but has not taken it for several months and has not had issues.  Home Safety/Smoke Alarms: Feels safe in home. Smoke alarms in place.  Living environment; residence and Firearm Safety: Lives w/ wife. Lives in a rental home for now but may consider building in the future. 1-story house/ trailer, firearms stored safely. Seat Belt Safety/Bike Helmet: Wears seat belt.   Counseling:   Eye Exam- Follows w/ eye doctor in [place] [facility]- Does not follow w/ dentist regularly but plans to schedule an upcoming appointment.  Male:   CCS- last 09/16/10. Moderate diverticulosis, otherwise normal. 10  year recall.     PSA- He would like to have PSA checked with next labs. Lab Results  Component Value Date   PSA 0.57 11/10/2014   PSA 0.50 09/11/2012   PSA 0.86 09/06/2011       Objective:    Today's Vitals   07/04/16 1038  BP: (!) 152/92  Pulse: 97  SpO2: 98%  Weight: 258 lb 9.6 oz (117.3 kg)  Height: 5\' 6"  (1.676 m)   Body mass index is 41.74 kg/m.  Wt Readings from Last 3 Encounters:  07/04/16 258 lb 9.6 oz (117.3 kg)  06/18/15 261 lb 3.2 oz (118.5 kg)  11/18/14 265 lb 4 oz (120.3 kg)    Current Medications (verified) Outpatient Encounter Prescriptions as of 07/04/2016  Medication Sig  . b complex vitamins capsule Take 1 capsule by mouth every other day.  . fish oil-omega-3 fatty acids 1000 MG capsule Take 2 g by mouth daily as needed.   Marland Kitchen losartan-hydrochlorothiazide (HYZAAR) 100-25 MG tablet TAKE 1 TABLET BY MOUTH DAILY.  . multivitamin (THERAGRAN) tablet Take 1 tablet by mouth daily.    . sildenafil (VIAGRA) 100 MG tablet Take 0.5-1 tablets (50-100 mg total) by mouth daily as needed for erectile dysfunction.  Marland Kitchen allopurinol (ZYLOPRIM) 300 MG tablet TAKE 1 TABLET BY MOUTH DAILY. (Patient not taking: Reported on 07/04/2016)  . ALPRAZolam (XANAX) 1 MG tablet TAKE 1 TABLET BY MOUTH TWICE DAILY AS NEEDED FOR SLEEP OR ANXIETY (Patient not taking: Reported on 07/04/2016)  . amLODipine (NORVASC)  5 MG tablet TAKE 1 TABLET (5 MG TOTAL) BY MOUTH DAILY. (Patient not taking: Reported on 07/04/2016)  . atorvastatin (LIPITOR) 10 MG tablet TAKE 1 TABLET (10 MG TOTAL) BY MOUTH DAILY. (Patient not taking: Reported on 07/04/2016)  . colchicine 0.6 MG tablet Take 1 tablet (0.6 mg total) by mouth daily. Gout Flare-up (Patient not taking: Reported on 07/04/2016)  . [DISCONTINUED] BELSOMRA 20 MG TABS TAKE 1 TABLET BY MOUTH AT BEDTIME (Patient not taking: Reported on 07/04/2016)  . [DISCONTINUED] dextromethorphan-guaiFENesin (MUCINEX DM) 30-600 MG per 12 hr tablet Take 2 tablets by mouth 2 (two)  times daily as needed.   . [DISCONTINUED] predniSONE (DELTASONE) 20 MG tablet 1 tab po tid day 1, 1 tab po bid day 2, 1 tab po day 3. (Patient not taking: Reported on 07/04/2016)   No facility-administered encounter medications on file as of 07/04/2016.     Allergies (verified) Patient has no known allergies.   History: Past Medical History:  Diagnosis Date  . Abnormal LFTs 09/06/2011  . Acute bronchitis 08/16/2010  . Anxiety 09/06/2011  . Chicken pox as a child  . Fatigue   . Gout   . Gout   . Grief reaction 09/09/2012  . Hyperglycemia 09/06/2011  . Hyperlipidemia 09/06/2011  . Hypertension   . Insomnia 08/16/2010  . Knee pain, acute 02/28/2012  . Low testosterone 02/28/2012  . Measles as a child  . Mumps as a child  . Nocturia 08/16/2010  . Obese   . Preventative health care 09/06/2011  . Sleep apnea 09/06/2011  . Unspecified vitamin D deficiency 02/28/2012   Past Surgical History:  Procedure Laterality Date  . BACK SURGERY    . COLONOSCOPY  09/16/10   Dr. Deatra Ina: moderate diverticulosis sigmoid and descending colon, o/w normal: repeat 10 yrs.  . intestines sewn  66 yrs old   shot once made 6 holes  . TONSILLECTOMY  as a child  . VASECTOMY  14 yrs old   Family History  Problem Relation Age of Onset  . Diabetes Mother        type 2  . Cancer Maternal Grandmother        throat  . Heart disease Maternal Grandfather   . Diabetes Maternal Grandfather   . Depression Daughter        anxiety  . Graves' disease Daughter   . Colon polyps Neg Hx    Social History   Occupational History  . Not on file.   Social History Main Topics  . Smoking status: Former Smoker    Types: Cigarettes    Quit date: 08/30/1968  . Smokeless tobacco: Never Used     Comment: smoked in high school  . Alcohol use No     Comment: very little  . Drug use: No  . Sexual activity: Yes    Partners: Female    Tobacco Counseling Counseling given: Not Answered   Activities of Daily Living In your  present state of health, do you have any difficulty performing the following activities: 07/04/2016  Hearing? N  Vision? N  Difficulty concentrating or making decisions? N  Walking or climbing stairs? N  Dressing or bathing? N  Doing errands, shopping? N  Preparing Food and eating ? N  Using the Toilet? N  Managing your Medications? N  Managing your Finances? N  Housekeeping or managing your Housekeeping? N  Some recent data might be hidden    Immunizations and Health Maintenance  There is no immunization history on  file for this patient. Health Maintenance Due  Topic Date Due  . TETANUS/TDAP  06/06/1968  . PNA vac Low Risk Adult (2 of 2 - PPSV23) 11/18/2015    Patient Care Team: Mosie Lukes, MD as PCP - General (Family Medicine)  Indicate any recent Medical Services you may have received from other than [facility] providers in the past year (date may be approximate).     Assessment:   This is a routine wellness examination for David Esparza. Physical assessment deferred to PCP.  Hearing/Vision screen Hearing Screening Comments: Able to hear conversational tones w/o difficulty. No issues reported. Has had hearing checked and was told it was fine. Vision Screening Comments: Wears reading glasses. Follows w/ eye doctor as needed in [place].  Dietary issues and exercise activities discussed: Current Exercise Habits: The patient does not participate in regular exercise at present  Diet (meal preparation, eat out, water intake, caffeinated beverages, dairy products, fruits and vegetables): in general, a "healthy" diet  , well balanced. Is trying to improve diet. "Eat to clean and not to clog." Avoiding meat and trying to focus on a more plant based. This is challenging at time because he travels and entertains frequently. He is avoiding white carbohydrates as much as possible. Does not eat many sweets. Breakfast today was eggs and grilled fish and hashbrowns.     Goals    . Increase  physical activity    . Weight (lb) < 200 lb (90.7 kg) (pt-stated)          Goal weight: 180-190 lbs      Depression Screen PHQ 2/9 Scores 07/04/2016  PHQ - 2 Score 0    Fall Risk Fall Risk  07/04/2016 11/18/2014  Falls in the past year? No No    Cognitive Function:       Ad8 score reviewed for issues:  Issues making decisions: No  Less interest in hobbies / activities: No  Repeats questions, stories (family complaining): No  Trouble using ordinary gadgets (microwave, computer, phone): No  Forgets the month or year: No  Mismanaging finances: No  Remembering appts: No  Daily problems with thinking and/or memory: No Ad8 score is= 0  Screening Tests Health Maintenance  Topic Date Due  . TETANUS/TDAP  06/06/1968  . PNA vac Low Risk Adult (2 of 2 - PPSV23) 11/18/2015  . INFLUENZA VACCINE  08/10/2016  . COLONOSCOPY  09/15/2020  . Hepatitis C Screening  Completed      Plan:    Follow-up w/ PCP as scheduled.  Create and bring a copy of your advance directives to your next office visit.  Consider the new shingles vaccine, Shingrix. This is available at most pharmacies.  I have personally reviewed and noted the following in the patient's chart:   . Medical and social history . Use of alcohol, tobacco or illicit drugs  . Current medications and supplements . Functional ability and status . Nutritional status . Physical activity . Advanced directives . List of other physicians . Vitals . Screenings to include cognitive, depression, and falls . Referrals and appointments  In addition, I have reviewed and discussed with patient certain preventive protocols, quality metrics, and best practice recommendations. A written personalized care plan for preventive services as well as general preventive health recommendations were provided to patient.     Dorrene German, RN   07/04/2016    Medical screening examination was performed by Health Coach and as  supervising physician I was immediately available for consultation/collaboration. I  have reviewed documentation and agree with assessment and plan.  Penni Homans, MD

## 2016-07-04 ENCOUNTER — Encounter: Payer: Self-pay | Admitting: *Deleted

## 2016-07-04 ENCOUNTER — Ambulatory Visit (INDEPENDENT_AMBULATORY_CARE_PROVIDER_SITE_OTHER): Payer: Medicare Other | Admitting: *Deleted

## 2016-07-04 ENCOUNTER — Encounter: Payer: Medicare Other | Admitting: Family Medicine

## 2016-07-04 VITALS — BP 152/92 | HR 97 | Ht 66.0 in | Wt 258.6 lb

## 2016-07-04 DIAGNOSIS — Z0000 Encounter for general adult medical examination without abnormal findings: Secondary | ICD-10-CM

## 2016-07-04 NOTE — Patient Instructions (Addendum)
[name] , Thank you for taking time to come for your Medicare Wellness Visit. I appreciate your ongoing commitment to your health goals. Please review the following plan we discussed and let me know if I can assist you in the future.   Create and bring a copy of your advance directives to your next office visit.  Consider the new shingles vaccine, Shingrix. This is available at most pharmacies.  These are the goals we discussed: Goals    . Increase physical activity    . Weight (lb) < 200 lb (90.7 kg) (pt-stated)          Goal weight: 180-190 lbs       This is a list of the screening recommended for you and due dates:  Health Maintenance  Topic Date Due  . Tetanus Vaccine  06/06/1968  . Pneumonia vaccines (2 of 2 - PPSV23) 11/18/2015  . Flu Shot  08/10/2016  . Colon Cancer Screening  09/15/2020  .  Hepatitis C: One time screening is recommended by Center for Disease Control  (CDC) for  adults born from [birth year] through 1966.   Completed    Preventive Care 67 Years and Older, Male Preventive care refers to lifestyle choices and visits with your health care provider that can promote health and wellness. What does preventive care include?  A yearly physical exam. This is also called an annual well check.  Dental exams once or twice a year.  Routine eye exams. Ask your health care provider how often you should have your eyes checked.  Personal lifestyle choices, including: ? Daily care of your teeth and gums. ? Regular physical activity. ? Eating a healthy diet. ? Avoiding tobacco and drug use. ? Limiting alcohol use. ? Practicing safe sex. ? Taking low doses of aspirin every day. ? Taking vitamin and mineral supplements as recommended by your health care provider. What happens during an annual well check? The services and screenings done by your health care provider during your annual well check will depend on your age, overall health, lifestyle risk factors, and family  history of disease. Counseling Your health care provider may ask you questions about your:  Alcohol use.  Tobacco use.  Drug use.  Emotional well-being.  Home and relationship well-being.  Sexual activity.  Eating habits.  History of falls.  Memory and ability to understand (cognition).  Work and work Statistician.  Screening You may have the following tests or measurements:  Height, weight, and BMI.  Blood pressure.  Lipid and cholesterol levels. These may be checked every 5 years, or more frequently if you are over 84 years old.  Skin check.  Lung cancer screening. You may have this screening every year starting at age 58 if you have a 30-pack-year history of smoking and currently smoke or have quit within the past 15 years.  Fecal occult blood test (FOBT) of the stool. You may have this test every year starting at age 51.  Flexible sigmoidoscopy or colonoscopy. You may have a sigmoidoscopy every 5 years or a colonoscopy every 10 years starting at age 17.  Prostate cancer screening. Recommendations will vary depending on your family history and other risks.  Hepatitis C blood test.  Hepatitis B blood test.  Sexually transmitted disease (STD) testing.  Diabetes screening. This is done by checking your blood sugar (glucose) after you have not eaten for a while (fasting). You may have this done every 1-3 years.  Abdominal aortic aneurysm (AAA) screening. You may  need this if you are a current or former smoker.  Osteoporosis. You may be screened starting at age 75 if you are at high risk.  Talk with your health care provider about your test results, treatment options, and if necessary, the need for more tests. Vaccines Your health care provider may recommend certain vaccines, such as:  Influenza vaccine. This is recommended every year.  Tetanus, diphtheria, and acellular pertussis (Tdap, Td) vaccine. You may need a Td booster every 10 years.  Varicella  vaccine. You may need this if you have not been vaccinated.  Zoster vaccine. You may need this after age 63.  Measles, mumps, and rubella (MMR) vaccine. You may need at least one dose of MMR if you were born in 1957 or later. You may also need a second dose.  Pneumococcal 13-valent conjugate (PCV13) vaccine. One dose is recommended after age 49.  Pneumococcal polysaccharide (PPSV23) vaccine. One dose is recommended after age 52.  Meningococcal vaccine. You may need this if you have certain conditions.  Hepatitis A vaccine. You may need this if you have certain conditions or if you travel or work in places where you may be exposed to hepatitis A.  Hepatitis B vaccine. You may need this if you have certain conditions or if you travel or work in places where you may be exposed to hepatitis B.  Haemophilus influenzae type b (Hib) vaccine. You may need this if you have certain risk factors.  Talk to your health care provider about which screenings and vaccines you need and how often you need them. This information is not intended to replace advice given to you by your health care provider. Make sure you discuss any questions you have with your health care provider. Document Released: 01/23/2015 Document Revised: 09/16/2015 Document Reviewed: 10/28/2014 Elsevier Interactive Patient Education  2017 [employer].

## 2016-07-05 ENCOUNTER — Ambulatory Visit (INDEPENDENT_AMBULATORY_CARE_PROVIDER_SITE_OTHER): Payer: Medicare Other | Admitting: Family Medicine

## 2016-07-05 ENCOUNTER — Encounter: Payer: Self-pay | Admitting: Family Medicine

## 2016-07-05 VITALS — BP 160/100 | HR 96 | Temp 98.3°F | Resp 20 | Wt 257.0 lb

## 2016-07-05 DIAGNOSIS — N529 Male erectile dysfunction, unspecified: Secondary | ICD-10-CM | POA: Diagnosis not present

## 2016-07-05 DIAGNOSIS — E349 Endocrine disorder, unspecified: Secondary | ICD-10-CM

## 2016-07-05 DIAGNOSIS — R739 Hyperglycemia, unspecified: Secondary | ICD-10-CM | POA: Diagnosis not present

## 2016-07-05 DIAGNOSIS — E559 Vitamin D deficiency, unspecified: Secondary | ICD-10-CM

## 2016-07-05 DIAGNOSIS — R350 Frequency of micturition: Secondary | ICD-10-CM

## 2016-07-05 DIAGNOSIS — R7989 Other specified abnormal findings of blood chemistry: Secondary | ICD-10-CM

## 2016-07-05 DIAGNOSIS — E785 Hyperlipidemia, unspecified: Secondary | ICD-10-CM

## 2016-07-05 DIAGNOSIS — M109 Gout, unspecified: Secondary | ICD-10-CM | POA: Diagnosis not present

## 2016-07-05 DIAGNOSIS — I10 Essential (primary) hypertension: Secondary | ICD-10-CM

## 2016-07-05 DIAGNOSIS — Z8669 Personal history of other diseases of the nervous system and sense organs: Secondary | ICD-10-CM | POA: Diagnosis not present

## 2016-07-05 DIAGNOSIS — E669 Obesity, unspecified: Secondary | ICD-10-CM | POA: Diagnosis not present

## 2016-07-05 MED ORDER — COLCHICINE 0.6 MG PO TABS: 0.6000 mg | ORAL_TABLET | Freq: Every day | ORAL | 3 refills | Status: DC

## 2016-07-05 MED ORDER — AMLODIPINE BESYLATE 5 MG PO TABS
ORAL_TABLET | ORAL | 1 refills | Status: DC
Start: 2016-07-05 — End: 2017-03-31

## 2016-07-05 NOTE — Progress Notes (Signed)
Subjective:  I acted as a Education administrator for Dr. Charlett Blake. [place], [place]  Patient ID: David Esparza, male    DOB: December 15, 1950, 66 y.o.   MRN: [id number]  Chief Complaint  Patient presents with  . Follow-up    HPI  Patient is in today for a follow up on numerous medical concerns including hyperglycemia, hyperlipidema, obesity, bell's palsy, hypertension and more. No recent febrile illness or hospitalizations. He has previously seen a PMD in another state but is here more often now. Has persistent numbness and dysfunction in right side of face s/p bell's palsy. Denies CP/palp/SOB/HA/congestion/fevers/GI or GU c/o. Taking meds as prescribed  Patient Care Team: Mosie Lukes, MD as PCP - General (Family Medicine)   Past Medical History:  Diagnosis Date  . Abnormal LFTs 09/06/2011  . Acute bronchitis 08/16/2010  . Anxiety 09/06/2011  . Chicken pox as a child  . Fatigue   . Gout   . Gout   . Grief reaction 09/09/2012  . Hyperglycemia 09/06/2011  . Hyperlipidemia 09/06/2011  . Hypertension   . Insomnia 08/16/2010  . Knee pain, acute 02/28/2012  . Low testosterone 02/28/2012  . Measles as a child  . Mumps as a child  . Nocturia 08/16/2010  . Obese   . Preventative health care 09/06/2011  . Sleep apnea 09/06/2011  . Unspecified vitamin D deficiency 02/28/2012    Past Surgical History:  Procedure Laterality Date  . BACK SURGERY    . COLONOSCOPY  09/16/10   Dr. Deatra Ina: moderate diverticulosis sigmoid and descending colon, o/w normal: repeat 10 yrs.  . intestines sewn  66 yrs old   shot once made 6 holes  . TONSILLECTOMY  as a child  . VASECTOMY  54 yrs old    Family History  Problem Relation Age of Onset  . Diabetes Mother        type 2  . Cancer Maternal Grandmother        throat  . Heart disease Maternal Grandfather   . Diabetes Maternal Grandfather   . Depression Daughter        anxiety  . Graves' disease Daughter   . Colon polyps Neg Hx     Social History   Social History  .  Marital status: Married    Spouse name: N/A  . Number of children: N/A  . Years of education: N/A   Occupational History  . Not on file.   Social History Main Topics  . Smoking status: Former Smoker    Types: Cigarettes    Quit date: 08/30/1968  . Smokeless tobacco: Never Used     Comment: smoked in high school  . Alcohol use No     Comment: very little  . Drug use: No  . Sexual activity: Yes    Partners: Female   Other Topics Concern  . Not on file   Social History Narrative  . No narrative on file    Outpatient Medications Prior to Visit  Medication Sig Dispense Refill  . b complex vitamins capsule Take 1 capsule by mouth every other day.    . fish oil-omega-3 fatty acids 1000 MG capsule Take 2 g by mouth daily as needed.     Marland Kitchen losartan-hydrochlorothiazide (HYZAAR) 100-25 MG tablet TAKE 1 TABLET BY MOUTH DAILY. 90 tablet 1  . multivitamin (THERAGRAN) tablet Take 1 tablet by mouth daily.      . sildenafil (VIAGRA) 100 MG tablet Take 0.5-1 tablets (50-100 mg total) by mouth daily as  needed for erectile dysfunction. 30 tablet 0  . allopurinol (ZYLOPRIM) 300 MG tablet TAKE 1 TABLET BY MOUTH DAILY. (Patient not taking: Reported on 07/04/2016) 30 tablet 1  . atorvastatin (LIPITOR) 10 MG tablet TAKE 1 TABLET (10 MG TOTAL) BY MOUTH DAILY. (Patient not taking: Reported on 07/04/2016) 30 tablet 0  . ALPRAZolam (XANAX) 1 MG tablet TAKE 1 TABLET BY MOUTH TWICE DAILY AS NEEDED FOR SLEEP OR ANXIETY (Patient not taking: Reported on 07/04/2016) 30 tablet 0  . amLODipine (NORVASC) 5 MG tablet TAKE 1 TABLET (5 MG TOTAL) BY MOUTH DAILY. (Patient not taking: Reported on 07/04/2016) 90 tablet 1  . colchicine 0.6 MG tablet Take 1 tablet (0.6 mg total) by mouth daily. Gout Flare-up (Patient not taking: Reported on 07/04/2016) 30 tablet 3   No facility-administered medications prior to visit.     No Known Allergies  Review of Systems  Constitutional: Positive for malaise/fatigue. Negative for  fever.  HENT: Negative for congestion.   Eyes: Negative for blurred vision.  Respiratory: Negative for cough and shortness of breath.   Cardiovascular: Negative for chest pain, palpitations and leg swelling.  Gastrointestinal: Negative for vomiting.  Musculoskeletal: Negative for back pain.  Skin: Negative for rash.  Neurological: Positive for sensory change. Negative for loss of consciousness and headaches.       Objective:    Physical Exam  Constitutional: He is oriented to person, place, and time. He appears well-developed and well-nourished. No distress.  HENT:  Head: Normocephalic and atraumatic.  Eyes: Conjunctivae are normal.  Neck: Normal range of motion. No thyromegaly present.  Cardiovascular: Normal rate and regular rhythm.   Pulmonary/Chest: Effort normal and breath sounds normal. He has no wheezes.  Abdominal: Soft. Bowel sounds are normal. There is no tenderness.  Musculoskeletal: Normal range of motion. He exhibits no edema or deformity.  Neurological: He is alert and oriented to person, place, and time. A cranial nerve deficit is present.  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 He is not diaphoretic.  Psychiatric: He has a normal mood and affect.    BP (!) 160/100 (BP Location: Left Arm, Patient Position: Sitting, Cuff Size: Large)   Pulse 96   Temp 98.3 F (36.8 C) (Oral)   Resp 20   Wt 257 lb (116.6 kg)   SpO2 98%   BMI 41.48 kg/m  Wt Readings from Last 3 Encounters:  07/05/16 257 lb (116.6 kg)  07/04/16 258 lb 9.6 oz (117.3 kg)  06/18/15 261 lb 3.2 oz (118.5 kg)   BP Readings from Last 3 Encounters:  07/05/16 (!) 160/100  07/04/16 (!) 152/92  06/18/15 140/86      There is no immunization history on file for this patient.  Health Maintenance  Topic Date Due  . TETANUS/TDAP  06/06/1968  . PNA vac Low Risk Adult (2 of 2 - PPSV23) 11/18/2015  . INFLUENZA VACCINE  08/10/2016  .  COLONOSCOPY  09/15/2020  . Hepatitis C Screening  Completed    Lab Results  Component Value Date   WBC 8.2 11/18/2014   HGB 17.7 (H) 11/18/2014   HCT 52.3 (H) 11/18/2014   PLT 224.0 11/18/2014   GLUCOSE 148 (H) 11/10/2014   CHOL 207 (H) 11/10/2014   TRIG 258.0 (H) 11/10/2014   HDL 44.30 11/10/2014   LDLDIRECT 115.0 11/10/2014   LDLCALC 95 09/11/2012   ALT 93 (H) 11/10/2014   AST 52 (H) 11/10/2014  NA 140 11/10/2014   K 4.0 11/10/2014   CL 103 11/10/2014   CREATININE 1.02 11/10/2014   BUN 20 11/10/2014   CO2 24 11/10/2014   TSH 2.18 11/10/2014   PSA 0.57 11/10/2014   HGBA1C 6.1 11/18/2014   MICROALBUR 1.55 02/21/2012    Lab Results  Component Value Date   TSH 2.18 11/10/2014   Lab Results  Component Value Date   WBC 8.2 11/18/2014   HGB 17.7 (H) 11/18/2014   HCT 52.3 (H) 11/18/2014   MCV 95.7 11/18/2014   PLT 224.0 11/18/2014   Lab Results  Component Value Date   NA 140 11/10/2014   K 4.0 11/10/2014   CO2 24 11/10/2014   GLUCOSE 148 (H) 11/10/2014   BUN 20 11/10/2014   CREATININE 1.02 11/10/2014   BILITOT 0.9 11/10/2014   ALKPHOS 94 11/10/2014   AST 52 (H) 11/10/2014   ALT 93 (H) 11/10/2014   PROT 7.2 11/10/2014   ALBUMIN 4.1 11/10/2014   CALCIUM 10.0 11/10/2014   GFR 77.80 11/10/2014   Lab Results  Component Value Date   CHOL 207 (H) 11/10/2014   Lab Results  Component Value Date   HDL 44.30 11/10/2014   Lab Results  Component Value Date   LDLCALC 95 09/11/2012   Lab Results  Component Value Date   TRIG 258.0 (H) 11/10/2014   Lab Results  Component Value Date   CHOLHDL 5 11/10/2014   Lab Results  Component Value Date   HGBA1C 6.1 11/18/2014         Assessment & Plan:   Problem List Items Addressed This Visit    Hypertension    Poorly controlled will alter medications, encouraged DASH diet, minimize caffeine and obtain adequate sleep. Report concerning symptoms and follow up as directed and as needed      Relevant  Medications   amLODipine (NORVASC) 5 MG tablet   Other Relevant Orders   CBC   Comprehensive metabolic panel   TSH   Obese    Encouraged DASH diet, decrease po intake and increase exercise as tolerated. Needs 7-8 hours of sleep nightly. Avoid trans fats, eat small, frequent meals every 4-5 hours with lean proteins, complex carbs and healthy fats. Minimize simple carbs, bariatric referral suggested      Hyperglycemia    hgba1c acceptable, minimize simple carbs. Increase exercise as tolerated.       Relevant Orders   Hemoglobin A1c   Hyperlipidemia    Tolerating statin, encouraged heart healthy diet, avoid trans fats, minimize simple carbs and saturated fats. Increase exercise as tolerated      Relevant Medications   amLODipine (NORVASC) 5 MG tablet   Other Relevant Orders   Lipid panel   Low testosterone   Relevant Orders   PSA   Vitamin D deficiency - Primary    Is not taking his vitamin D regularly so he agrees to take more regularly      Relevant Orders   Comprehensive metabolic panel   VITAMIN D 25 Hydroxy (Vit-D Deficiency, Fractures)   Erectile dysfunction    Uses Viagra prn        Other Visit Diagnoses    Testosterone deficiency       Relevant Orders   PSA   Gout, unspecified cause, unspecified chronicity, unspecified site       Relevant Orders   Uric acid   Urinary frequency       Relevant Orders   PSA      I have  discontinued Mr. Blais's ALPRAZolam. I am also having him maintain his multivitamin, fish oil-omega-3 fatty acids, b complex vitamins, allopurinol, atorvastatin, losartan-hydrochlorothiazide, sildenafil, colchicine, and amLODipine.  Meds ordered this encounter  Medications  . colchicine 0.6 MG tablet    Sig: Take 1 tablet (0.6 mg total) by mouth daily. Gout Flare-up    Dispense:  30 tablet    Refill:  3  . amLODipine (NORVASC) 5 MG tablet    Sig: TAKE 1 TABLET (5 MG TOTAL) BY MOUTH DAILY.    Dispense:  90 tablet    Refill:  1    CMA  served as Education administrator during this visit. History, Physical and Plan performed by medical provider. Documentation and orders reviewed and attested to.  Penni Homans, MD

## 2016-07-05 NOTE — Assessment & Plan Note (Signed)
Poorly controlled will alter medications, encouraged DASH diet, minimize caffeine and obtain adequate sleep. Report concerning symptoms and follow up as directed and as needed 

## 2016-07-05 NOTE — Patient Instructions (Signed)
Preventive Care 65 Years and Older, Male Preventive care refers to lifestyle choices and visits with your health care provider that can promote health and wellness. What does preventive care include?  A yearly physical exam. This is also called an annual well check.  Dental exams once or twice a year.  Routine eye exams. Ask your health care provider how often you should have your eyes checked.  Personal lifestyle choices, including: ? Daily care of your teeth and gums. ? Regular physical activity. ? Eating a healthy diet. ? Avoiding tobacco and drug use. ? Limiting alcohol use. ? Practicing safe sex. ? Taking low doses of aspirin every day. ? Taking vitamin and mineral supplements as recommended by your health care provider. What happens during an annual well check? The services and screenings done by your health care provider during your annual well check will depend on your age, overall health, lifestyle risk factors, and family history of disease. Counseling Your health care provider may ask you questions about your:  Alcohol use.  Tobacco use.  Drug use.  Emotional well-being.  Home and relationship well-being.  Sexual activity.  Eating habits.  History of falls.  Memory and ability to understand (cognition).  Work and work environment.  Screening You may have the following tests or measurements:  Height, weight, and BMI.  Blood pressure.  Lipid and cholesterol levels. These may be checked every 5 years, or more frequently if you are over 50 years old.  Skin check.  Lung cancer screening. You may have this screening every year starting at age 55 if you have a 30-pack-year history of smoking and currently smoke or have quit within the past 15 years.  Fecal occult blood test (FOBT) of the stool. You may have this test every year starting at age 50.  Flexible sigmoidoscopy or colonoscopy. You may have a sigmoidoscopy every 5 years or a colonoscopy every 10  years starting at age 50.  Prostate cancer screening. Recommendations will vary depending on your family history and other risks.  Hepatitis C blood test.  Hepatitis B blood test.  Sexually transmitted disease (STD) testing.  Diabetes screening. This is done by checking your blood sugar (glucose) after you have not eaten for a while (fasting). You may have this done every 1-3 years.  Abdominal aortic aneurysm (AAA) screening. You may need this if you are a current or former smoker.  Osteoporosis. You may be screened starting at age 70 if you are at high risk.  Talk with your health care provider about your test results, treatment options, and if necessary, the need for more tests. Vaccines Your health care provider may recommend certain vaccines, such as:  Influenza vaccine. This is recommended every year.  Tetanus, diphtheria, and acellular pertussis (Tdap, Td) vaccine. You may need a Td booster every 10 years.  Varicella vaccine. You may need this if you have not been vaccinated.  Zoster vaccine. You may need this after age 60.  Measles, mumps, and rubella (MMR) vaccine. You may need at least one dose of MMR if you were born in 1957 or later. You may also need a second dose.  Pneumococcal 13-valent conjugate (PCV13) vaccine. One dose is recommended after age 65.  Pneumococcal polysaccharide (PPSV23) vaccine. One dose is recommended after age 65.  Meningococcal vaccine. You may need this if you have certain conditions.  Hepatitis A vaccine. You may need this if you have certain conditions or if you travel or work in places where you   may be exposed to hepatitis A.  Hepatitis B vaccine. You may need this if you have certain conditions or if you travel or work in places where you may be exposed to hepatitis B.  Haemophilus influenzae type b (Hib) vaccine. You may need this if you have certain risk factors.  Talk to your health care provider about which screenings and vaccines  you need and how often you need them. This information is not intended to replace advice given to you by your health care provider. Make sure you discuss any questions you have with your health care provider. Document Released: 01/23/2015 Document Revised: 09/16/2015 Document Reviewed: 10/28/2014 Elsevier Interactive Patient Education  2017 [employer].

## 2016-07-05 NOTE — Assessment & Plan Note (Signed)
Is not taking his vitamin D regularly so he agrees to take more regularly

## 2016-07-05 NOTE — Assessment & Plan Note (Signed)
Encouraged DASH diet, decrease po intake and increase exercise as tolerated. Needs 7-8 hours of sleep nightly. Avoid trans fats, eat small, frequent meals every 4-5 hours with lean proteins, complex carbs and healthy fats. Minimize simple carbs, bariatric referral suggested 

## 2016-07-06 DIAGNOSIS — Z8669 Personal history of other diseases of the nervous system and sense organs: Secondary | ICD-10-CM | POA: Insufficient documentation

## 2016-07-06 LAB — CBC
HEMATOCRIT: 50.5 % (ref 39.0–52.0)
HEMOGLOBIN: 17.6 g/dL — AB (ref 13.0–17.0)
MCHC: 34.8 g/dL (ref 30.0–36.0)
MCV: 97.4 fl (ref 78.0–100.0)
PLATELETS: 138 10*3/uL — AB (ref 150.0–400.0)
RBC: 5.19 Mil/uL (ref 4.22–5.81)
RDW: 13.7 % (ref 11.5–15.5)
WBC: 8.5 10*3/uL (ref 4.0–10.5)

## 2016-07-06 LAB — LDL CHOLESTEROL, DIRECT: LDL DIRECT: 72 mg/dL

## 2016-07-06 LAB — COMPREHENSIVE METABOLIC PANEL
ALBUMIN: 4.5 g/dL (ref 3.5–5.2)
ALK PHOS: 108 U/L (ref 39–117)
ALT: 74 U/L — AB (ref 0–53)
AST: 64 U/L — ABNORMAL HIGH (ref 0–37)
BILIRUBIN TOTAL: 0.9 mg/dL (ref 0.2–1.2)
BUN: 24 mg/dL — ABNORMAL HIGH (ref 6–23)
CALCIUM: 10 mg/dL (ref 8.4–10.5)
CO2: 27 mEq/L (ref 19–32)
Chloride: 106 mEq/L (ref 96–112)
Creatinine, Ser: 1.12 mg/dL (ref 0.40–1.50)
GFR: 69.49 mL/min (ref >60.00)
Glucose, Bld: 88 mg/dL (ref 70–99)
POTASSIUM: 4.5 meq/L (ref 3.5–5.1)
Sodium: 142 mEq/L (ref 135–145)
TOTAL PROTEIN: 7.2 g/dL (ref 6.0–8.3)

## 2016-07-06 LAB — LIPID PANEL
CHOLESTEROL: 157 mg/dL (ref 0–200)
HDL: 47.5 mg/dL (ref >39.00)
NonHDL: 109.25
Total CHOL/HDL Ratio: 3
Triglycerides: 222 mg/dL — ABNORMAL HIGH (ref 0.0–149.0)
VLDL: 44.4 mg/dL — AB (ref 0.0–40.0)

## 2016-07-06 LAB — PSA: PSA: 1.31 ng/mL (ref 0.10–4.00)

## 2016-07-06 LAB — HEMOGLOBIN A1C: Hgb A1c MFr Bld: 5.8 % (ref 4.6–6.5)

## 2016-07-06 LAB — TSH: TSH: 2.27 u[IU]/mL (ref 0.35–4.50)

## 2016-07-06 LAB — VITAMIN D 25 HYDROXY (VIT D DEFICIENCY, FRACTURES): VITD: 23.07 ng/mL — ABNORMAL LOW (ref 30.00–100.00)

## 2016-07-06 LAB — URIC ACID: URIC ACID, SERUM: 7.6 mg/dL (ref 4.0–7.8)

## 2016-07-06 NOTE — Assessment & Plan Note (Signed)
Tolerating statin, encouraged heart healthy diet, avoid trans fats, minimize simple carbs and saturated fats. Increase exercise as tolerated 

## 2016-07-06 NOTE — Assessment & Plan Note (Signed)
hgba1c acceptable, minimize simple carbs. Increase exercise as tolerated.  

## 2016-07-06 NOTE — Assessment & Plan Note (Addendum)
Uses Viagra prn

## 2016-07-06 NOTE — Assessment & Plan Note (Signed)
Persistent right 7th CN dysfunction

## 2016-07-07 MED ORDER — ATORVASTATIN CALCIUM 10 MG PO TABS: 10.0000 mg | ORAL_TABLET | Freq: Every day | ORAL | 5 refills | Status: DC

## 2016-07-07 NOTE — Addendum Note (Signed)
Addended byDamita Dunnings D on: 07/07/2016 02:45 PM   Modules accepted: Orders

## 2016-07-26 ENCOUNTER — Encounter: Payer: Self-pay | Admitting: Family Medicine

## 2016-07-26 ENCOUNTER — Ambulatory Visit (INDEPENDENT_AMBULATORY_CARE_PROVIDER_SITE_OTHER): Payer: Medicare Other | Admitting: Family Medicine

## 2016-07-26 DIAGNOSIS — I10 Essential (primary) hypertension: Secondary | ICD-10-CM

## 2016-07-26 DIAGNOSIS — L918 Other hypertrophic disorders of the skin: Secondary | ICD-10-CM

## 2016-07-26 NOTE — Progress Notes (Signed)
Subjective:  I acted as a Education administrator for Dr. Charlett Blake. [place], [place]  Patient ID: David Esparza, male    DOB: 05/01/1950, 66 y.o.   MRN: [id number]  No chief complaint on file.   HPI  Patient is in today for an acute visit for skin tag removal. He is feeling well. No recent febrile illness or hospitalizations. Denies CP/palp/SOB/HA/congestion/fevers/GI or GU c/o. Taking meds as prescribed  Patient Care Team: Mosie Lukes, MD as PCP - General (Family Medicine)   Past Medical History:  Diagnosis Date  . Abnormal LFTs 09/06/2011  . Acute bronchitis 08/16/2010  . Anxiety 09/06/2011  . Chicken pox as a child  . Cutaneous skin tags 07/27/2016  . Fatigue   . Gout   . Gout   . Grief reaction 09/09/2012  . Hyperglycemia 09/06/2011  . Hyperlipidemia 09/06/2011  . Hypertension   . Insomnia 08/16/2010  . Knee pain, acute 02/28/2012  . Low testosterone 02/28/2012  . Measles as a child  . Mumps as a child  . Nocturia 08/16/2010  . Obese   . Preventative health care 09/06/2011  . Sleep apnea 09/06/2011  . Unspecified vitamin D deficiency 02/28/2012    Past Surgical History:  Procedure Laterality Date  . BACK SURGERY    . COLONOSCOPY  09/16/10   Dr. Deatra Ina: moderate diverticulosis sigmoid and descending colon, o/w normal: repeat 10 yrs.  . intestines sewn  66 yrs old   shot once made 6 holes  . TONSILLECTOMY  as a child  . VASECTOMY  30 yrs old    Family History  Problem Relation Age of Onset  . Diabetes Mother        type 2  . Cancer Maternal Grandmother        throat  . Heart disease Maternal Grandfather   . Diabetes Maternal Grandfather   . Depression Daughter        anxiety  . Graves' disease Daughter   . Colon polyps Neg Hx     Social History   Social History  . Marital status: Married    Spouse name: N/A  . Number of children: N/A  . Years of education: N/A   Occupational History  . Not on file.   Social History Main Topics  . Smoking status: Former Smoker    Types:  Cigarettes    Quit date: 08/30/1968  . Smokeless tobacco: Never Used     Comment: smoked in high school  . Alcohol use No     Comment: very little  . Drug use: No  . Sexual activity: Yes    Partners: Female   Other Topics Concern  . Not on file   Social History Narrative  . No narrative on file    Outpatient Medications Prior to Visit  Medication Sig Dispense Refill  . amLODipine (NORVASC) 5 MG tablet TAKE 1 TABLET (5 MG TOTAL) BY MOUTH DAILY. 90 tablet 1  . atorvastatin (LIPITOR) 10 MG tablet Take 1 tablet (10 mg total) by mouth at bedtime. 30 tablet 5  . b complex vitamins capsule Take 1 capsule by mouth every other day.    . colchicine 0.6 MG tablet Take 1 tablet (0.6 mg total) by mouth daily. Gout Flare-up 30 tablet 3  . fish oil-omega-3 fatty acids 1000 MG capsule Take 2 g by mouth daily as needed.     Marland Kitchen losartan-hydrochlorothiazide (HYZAAR) 100-25 MG tablet TAKE 1 TABLET BY MOUTH DAILY. 90 tablet 1  . multivitamin (THERAGRAN) tablet  Take 1 tablet by mouth daily.      . sildenafil (VIAGRA) 100 MG tablet Take 0.5-1 tablets (50-100 mg total) by mouth daily as needed for erectile dysfunction. 30 tablet 0  . allopurinol (ZYLOPRIM) 300 MG tablet TAKE 1 TABLET BY MOUTH DAILY. (Patient not taking: Reported on 07/04/2016) 30 tablet 1   No facility-administered medications prior to visit.     No Known Allergies  Review of Systems  Constitutional: Negative for fever and malaise/fatigue.  HENT: Negative for congestion.   Eyes: Negative for blurred vision.  Respiratory: Negative for shortness of breath.   Cardiovascular: Negative for chest pain, palpitations and leg swelling.  Gastrointestinal: Negative for abdominal pain, blood in stool and nausea.  Genitourinary: Negative for dysuria and frequency.  Musculoskeletal: Negative for falls.  Skin: Negative for rash.  Neurological: Negative for dizziness, loss of consciousness and headaches.  Endo/Heme/Allergies: Negative for  environmental allergies.  Psychiatric/Behavioral: Negative for depression. The patient is not nervous/anxious.        Objective:    Physical Exam  Constitutional: He is oriented to person, place, and time. He appears well-developed and well-nourished. No distress.  HENT:  Head: Normocephalic and atraumatic.  Nose: Nose normal.  Eyes: Right eye exhibits no discharge. Left eye exhibits no discharge.  Neck: Normal range of motion. Neck supple.  Cardiovascular: Normal rate and regular rhythm.   No murmur heard. Pulmonary/Chest: Effort normal and breath sounds normal.  Abdominal: Soft. Bowel sounds are normal. There is no tenderness.  Musculoskeletal: He exhibits no edema.  Neurological: He is alert and oriented to person, place, and time.  Skin: Skin is warm and dry.  Cleaned 4 skin tags with alcohol and iodine, applied cryotherapy to 2 lesions in right axillae and 1 in left axillae. Lesion in right inguinal region was excised. minmal bleeding stopped with silver nitrate. Covered with antibiotic ointment and a bandaid  Psychiatric: He has a normal mood and affect.  Nursing note and vitals reviewed.   BP 120/78 (BP Location: Right Arm, Patient Position: Sitting, Cuff Size: Normal)   Pulse (!) 114   Temp 98.3 F (36.8 C) (Oral)   Resp 18   Wt 255 lb (115.7 kg)   SpO2 98%   BMI 41.16 kg/m  Wt Readings from Last 3 Encounters:  07/26/16 255 lb (115.7 kg)  07/05/16 257 lb (116.6 kg)  07/04/16 258 lb 9.6 oz (117.3 kg)   BP Readings from Last 3 Encounters:  07/26/16 120/78  07/05/16 (!) 160/100  07/04/16 (!) 152/92      There is no immunization history on file for this patient.  Health Maintenance  Topic Date Due  . TETANUS/TDAP  06/06/1968  . PNA vac Low Risk Adult (2 of 2 - PPSV23) 11/18/2015  . INFLUENZA VACCINE  08/10/2016  . COLONOSCOPY  09/15/2020  . Hepatitis C Screening  Completed    Lab Results  Component Value Date   WBC 8.5 07/05/2016   HGB 17.6 (H)  07/05/2016   HCT 50.5 07/05/2016   PLT 138.0 (L) 07/05/2016   GLUCOSE 88 07/05/2016   CHOL 157 07/05/2016   TRIG 222.0 (H) 07/05/2016   HDL 47.50 07/05/2016   LDLDIRECT 72.0 07/05/2016   LDLCALC 95 09/11/2012   ALT 74 (H) 07/05/2016   AST 64 (H) 07/05/2016   NA 142 07/05/2016   K 4.5 07/05/2016   CL 106 07/05/2016   CREATININE 1.12 07/05/2016   BUN 24 (H) 07/05/2016   CO2 27 07/05/2016   TSH  2.27 07/05/2016   PSA 1.31 07/05/2016   HGBA1C 5.8 07/05/2016   MICROALBUR 1.55 02/21/2012    Lab Results  Component Value Date   TSH 2.27 07/05/2016   Lab Results  Component Value Date   WBC 8.5 07/05/2016   HGB 17.6 (H) 07/05/2016   HCT 50.5 07/05/2016   MCV 97.4 07/05/2016   PLT 138.0 (L) 07/05/2016   Lab Results  Component Value Date   NA 142 07/05/2016   K 4.5 07/05/2016   CO2 27 07/05/2016   GLUCOSE 88 07/05/2016   BUN 24 (H) 07/05/2016   CREATININE 1.12 07/05/2016   BILITOT 0.9 07/05/2016   ALKPHOS 108 07/05/2016   AST 64 (H) 07/05/2016   ALT 74 (H) 07/05/2016   PROT 7.2 07/05/2016   ALBUMIN 4.5 07/05/2016   CALCIUM 10.0 07/05/2016   GFR 69.49 07/05/2016   Lab Results  Component Value Date   CHOL 157 07/05/2016   Lab Results  Component Value Date   HDL 47.50 07/05/2016   Lab Results  Component Value Date   LDLCALC 95 09/11/2012   Lab Results  Component Value Date   TRIG 222.0 (H) 07/05/2016   Lab Results  Component Value Date   CHOLHDL 3 07/05/2016   Lab Results  Component Value Date   HGBA1C 5.8 07/05/2016         Assessment & Plan:   Problem List Items Addressed This Visit    Hypertension    Well controlled, no changes to meds. Encouraged heart healthy diet such as the DASH diet and exercise as tolerated.       Cutaneous skin tags    Applied cryotherapy to skin tags in b/l axillae and he tolerated it well. Excised a skin tag from right inguinal lesion after cleaning with alcohol and iodine. Froze a total of 3 lesions and excised one.  Covered with antibiotic ointment and bandaid. He will clean with mild soap and water daily. Call with any concerns.         I am having Mr. [place] maintain his multivitamin, fish oil-omega-3 fatty acids, b complex vitamins, allopurinol, losartan-hydrochlorothiazide, sildenafil, colchicine, amLODipine, and atorvastatin.  No orders of the defined types were placed in this encounter.   CMA served as Education administrator during this visit. History, Physical and Plan performed by medical provider. Documentation and orders reviewed and attested to.  Penni Homans, MD

## 2016-07-26 NOTE — Patient Instructions (Signed)
Skin Tag, Adult A skin tag (acrochordon) is a soft, extra growth of skin. Most skin tags are flesh-colored and rarely bigger than a pencil eraser. They commonly form near areas where there are folds in the skin, such as the armpit or groin. Skin tags are not dangerous, and they do not spread from person to person (are not contagious). You may have one skin tag or several. Skin tags do not require treatment. However, your health care provider may recommend removal of a skin tag if it:  Gets irritated from clothing.  Bleeds.  Is visible and unsightly.  Your health care provider can remove skin tags with a simple surgical procedure or a procedure that involves freezing the skin tag. Follow these instructions at home:  Watch for any changes in your skin tag. A normal skin tag does not require any other special care at home.  Take over-the-counter and prescription medicines only as told by your health care provider.  Keep all follow-up visits as told by your health care provider. This is important. Contact a health care provider if:  You have a skin tag that: ? Becomes painful. ? Changes color. ? Bleeds. ? Swells.  You develop more skin tags. This information is not intended to replace advice given to you by your health care provider. Make sure you discuss any questions you have with your health care provider. Document Released: 01/11/2015 Document Revised: 08/23/2015 Document Reviewed: 01/11/2015 Elsevier Interactive Patient Education  2018 Elsevier Inc.  

## 2016-07-27 ENCOUNTER — Encounter: Payer: Self-pay | Admitting: Family Medicine

## 2016-07-27 DIAGNOSIS — L918 Other hypertrophic disorders of the skin: Secondary | ICD-10-CM | POA: Insufficient documentation

## 2016-07-27 HISTORY — DX: Other hypertrophic disorders of the skin: L91.8

## 2016-07-27 NOTE — Assessment & Plan Note (Addendum)
Applied cryotherapy to skin tags in b/l axillae and he tolerated it well. Excised a skin tag from right inguinal lesion after cleaning with alcohol and iodine. Froze a total of 3 lesions and excised one. Covered with antibiotic ointment and bandaid. He will clean with mild soap and water daily. Call with any concerns.

## 2016-07-27 NOTE — Assessment & Plan Note (Signed)
Well controlled, no changes to meds. Encouraged heart healthy diet such as the DASH diet and exercise as tolerated.  °

## 2016-08-01 ENCOUNTER — Other Ambulatory Visit: Payer: Self-pay

## 2016-09-16 ENCOUNTER — Ambulatory Visit (INDEPENDENT_AMBULATORY_CARE_PROVIDER_SITE_OTHER): Payer: Medicare Other | Admitting: Family Medicine

## 2016-09-16 ENCOUNTER — Encounter: Payer: Self-pay | Admitting: Family Medicine

## 2016-09-16 DIAGNOSIS — N529 Male erectile dysfunction, unspecified: Secondary | ICD-10-CM | POA: Diagnosis not present

## 2016-09-16 DIAGNOSIS — L918 Other hypertrophic disorders of the skin: Secondary | ICD-10-CM

## 2016-09-16 DIAGNOSIS — F419 Anxiety disorder, unspecified: Secondary | ICD-10-CM

## 2016-09-16 MED ORDER — SILDENAFIL CITRATE 100 MG PO TABS: 50.0000 mg | ORAL_TABLET | Freq: Every day | ORAL | 2 refills | Status: DC | PRN

## 2016-09-16 MED ORDER — ALPRAZOLAM 1 MG PO TABS: 1.0000 mg | ORAL_TABLET | Freq: Two times a day (BID) | ORAL | 1 refills | Status: DC | PRN

## 2016-09-16 NOTE — Progress Notes (Signed)
Subjective:  I acted as a Education administrator for Dr. Charlett Blake. [place], [place]  Patient ID: David Esparza, male    DOB: September 12, 1950, 66 y.o.   MRN: [id number]  No chief complaint on file.   HPI  Patient is in today for a follow up for skin tag removal. He was previously subjected to cryotherapy to the 3 tags but they did not fall off. No concerning symptoms today or recent illness. Denies CP/palp/SOB/HA/congestion/fevers/GI or GU c/o. Taking meds as prescribed  Patient Care Team: Mosie Lukes, MD as PCP - General (Family Medicine)   Past Medical History:  Diagnosis Date  . Abnormal LFTs 09/06/2011  . Acute bronchitis 08/16/2010  . Anxiety 09/06/2011  . Chicken pox as a child  . Cutaneous skin tags 07/27/2016  . Fatigue   . Gout   . Gout   . Grief reaction 09/09/2012  . Hyperglycemia 09/06/2011  . Hyperlipidemia 09/06/2011  . Hypertension   . Insomnia 08/16/2010  . Knee pain, acute 02/28/2012  . Low testosterone 02/28/2012  . Measles as a child  . Mumps as a child  . Nocturia 08/16/2010  . Obese   . Preventative health care 09/06/2011  . Sleep apnea 09/06/2011  . Unspecified vitamin D deficiency 02/28/2012    Past Surgical History:  Procedure Laterality Date  . BACK SURGERY    . COLONOSCOPY  09/16/10   Dr. Deatra Ina: moderate diverticulosis sigmoid and descending colon, o/w normal: repeat 10 yrs.  . intestines sewn  66 yrs old   shot once made 6 holes  . TONSILLECTOMY  as a child  . VASECTOMY  35 yrs old    Family History  Problem Relation Age of Onset  . Diabetes Mother        type 2  . Cancer Maternal Grandmother        throat  . Heart disease Maternal Grandfather   . Diabetes Maternal Grandfather   . Depression Daughter        anxiety  . Graves' disease Daughter   . Colon polyps Neg Hx     Social History   Social History  . Marital status: Married    Spouse name: N/A  . Number of children: N/A  . Years of education: N/A   Occupational History  . Not on file.   Social  History Main Topics  . Smoking status: Former Smoker    Types: Cigarettes    Quit date: 08/30/1968  . Smokeless tobacco: Never Used     Comment: smoked in high school  . Alcohol use No     Comment: very little  . Drug use: No  . Sexual activity: Yes    Partners: Female   Other Topics Concern  . Not on file   Social History Narrative  . No narrative on file    Outpatient Medications Prior to Visit  Medication Sig Dispense Refill  . amLODipine (NORVASC) 5 MG tablet TAKE 1 TABLET (5 MG TOTAL) BY MOUTH DAILY. 90 tablet 1  . atorvastatin (LIPITOR) 10 MG tablet Take 1 tablet (10 mg total) by mouth at bedtime. 30 tablet 5  . b complex vitamins capsule Take 1 capsule by mouth every other day.    . colchicine 0.6 MG tablet Take 1 tablet (0.6 mg total) by mouth daily. Gout Flare-up 30 tablet 3  . fish oil-omega-3 fatty acids 1000 MG capsule Take 2 g by mouth daily as needed.     Marland Kitchen losartan-hydrochlorothiazide (HYZAAR) 100-25 MG tablet TAKE  1 TABLET BY MOUTH DAILY. 90 tablet 1  . multivitamin (THERAGRAN) tablet Take 1 tablet by mouth daily.      . sildenafil (VIAGRA) 100 MG tablet Take 0.5-1 tablets (50-100 mg total) by mouth daily as needed for erectile dysfunction. 30 tablet 0  . allopurinol (ZYLOPRIM) 300 MG tablet TAKE 1 TABLET BY MOUTH DAILY. (Patient not taking: Reported on 07/04/2016) 30 tablet 1   No facility-administered medications prior to visit.     No Known Allergies  Review of Systems  Constitutional: Negative for fever and malaise/fatigue.  HENT: Negative for congestion.   Eyes: Negative for blurred vision.  Respiratory: Negative for cough and shortness of breath.   Cardiovascular: Negative for chest pain, palpitations and leg swelling.  Gastrointestinal: Negative for vomiting.  Musculoskeletal: Negative for back pain.  Skin: Negative for rash.  Neurological: Negative for loss of consciousness and headaches.       Objective:    Physical Exam  Constitutional: He  is oriented to person, place, and time. He appears well-developed and well-nourished. No distress.  HENT:  Head: Normocephalic and atraumatic.  Nose: Nose normal.  Eyes: Conjunctivae are normal. Right eye exhibits no discharge. Left eye exhibits no discharge.  Neck: Normal range of motion. Neck supple. No thyromegaly present.  Cardiovascular: Normal rate and regular rhythm.   No murmur heard. Pulmonary/Chest: Effort normal and breath sounds normal. He has no wheezes.  Abdominal: Soft. Bowel sounds are normal. There is no tenderness.  Musculoskeletal: Normal range of motion. He exhibits no edema or deformity.  Neurological: He is alert and oriented to person, place, and time.  Skin: Skin is warm and dry. He is not diaphoretic.  Skin tags in b/l axillae, 2 on right and 1 on left.   Psychiatric: He has a normal mood and affect.  Nursing note and vitals reviewed.   BP 123/81 (BP Location: Left Arm, Patient Position: Sitting, Cuff Size: Normal)   Pulse (!) 110   Temp 98.3 F (36.8 C) (Oral)   Wt 257 lb 6.4 oz (116.8 kg)   BMI 41.55 kg/m  Wt Readings from Last 3 Encounters:  09/16/16 257 lb 6.4 oz (116.8 kg)  07/26/16 255 lb (115.7 kg)  07/05/16 257 lb (116.6 kg)   BP Readings from Last 3 Encounters:  09/16/16 123/81  07/26/16 120/78  07/05/16 (!) 160/100      There is no immunization history on file for this patient.  Health Maintenance  Topic Date Due  . TETANUS/TDAP  06/06/1968  . PNA vac Low Risk Adult (2 of 2 - PPSV23) 11/18/2015  . INFLUENZA VACCINE  08/10/2016  . COLONOSCOPY  09/15/2020  . Hepatitis C Screening  Completed    Lab Results  Component Value Date   WBC 8.5 07/05/2016   HGB 17.6 (H) 07/05/2016   HCT 50.5 07/05/2016   PLT 138.0 (L) 07/05/2016   GLUCOSE 88 07/05/2016   CHOL 157 07/05/2016   TRIG 222.0 (H) 07/05/2016   HDL 47.50 07/05/2016   LDLDIRECT 72.0 07/05/2016   LDLCALC 95 09/11/2012   ALT 74 (H) 07/05/2016   AST 64 (H) 07/05/2016   NA 142  07/05/2016   K 4.5 07/05/2016   CL 106 07/05/2016   CREATININE 1.12 07/05/2016   BUN 24 (H) 07/05/2016   CO2 27 07/05/2016   TSH 2.27 07/05/2016   PSA 1.31 07/05/2016   HGBA1C 5.8 07/05/2016   MICROALBUR 1.55 02/21/2012    Lab Results  Component Value Date   TSH 2.27  07/05/2016   Lab Results  Component Value Date   WBC 8.5 07/05/2016   HGB 17.6 (H) 07/05/2016   HCT 50.5 07/05/2016   MCV 97.4 07/05/2016   PLT 138.0 (L) 07/05/2016   Lab Results  Component Value Date   NA 142 07/05/2016   K 4.5 07/05/2016   CO2 27 07/05/2016   GLUCOSE 88 07/05/2016   BUN 24 (H) 07/05/2016   CREATININE 1.12 07/05/2016   BILITOT 0.9 07/05/2016   ALKPHOS 108 07/05/2016   AST 64 (H) 07/05/2016   ALT 74 (H) 07/05/2016   PROT 7.2 07/05/2016   ALBUMIN 4.5 07/05/2016   CALCIUM 10.0 07/05/2016   GFR 69.49 07/05/2016   Lab Results  Component Value Date   CHOL 157 07/05/2016   Lab Results  Component Value Date   HDL 47.50 07/05/2016   Lab Results  Component Value Date   LDLCALC 95 09/11/2012   Lab Results  Component Value Date   TRIG 222.0 (H) 07/05/2016   Lab Results  Component Value Date   CHOLHDL 3 07/05/2016   Lab Results  Component Value Date   HGBA1C 5.8 07/05/2016         Assessment & Plan:   Problem List Items Addressed This Visit    Anxiety   Relevant Medications   ALPRAZolam (XANAX) 1 MG tablet   Erectile dysfunction   Relevant Medications   sildenafil (VIAGRA) 100 MG tablet   Cutaneous skin tags    Tried cryotherapy previously but they did not fall off. The area is cleaned with alcohol, then area frozen with cryotherapy and then the 3 lesions are removed. Bleeding is minimial and stopped with silver nitrate.          I am having Mr. Vences maintain his multivitamin, fish oil-omega-3 fatty acids, b complex vitamins, allopurinol, losartan-hydrochlorothiazide, colchicine, amLODipine, atorvastatin, ALPRAZolam, and sildenafil.  Meds ordered this encounter    Medications  . ALPRAZolam (XANAX) 1 MG tablet    Sig: Take 1 tablet (1 mg total) by mouth 2 (two) times daily as needed for sleep.    Dispense:  30 tablet    Refill:  1  . sildenafil (VIAGRA) 100 MG tablet    Sig: Take 0.5-1 tablets (50-100 mg total) by mouth daily as needed for erectile dysfunction.    Dispense:  30 tablet    Refill:  2    CMA served as scribe during this visit. History, Physical and Plan performed by medical provider. Documentation and orders reviewed and attested to.  Penni Homans, MD

## 2016-09-16 NOTE — Patient Instructions (Signed)
  Start vitamin D daily   Vitamin D Deficiency Vitamin D deficiency is when your body does not have enough vitamin D. Vitamin D is important because:  It helps your body use other minerals that your body needs.  It helps keep your bones strong and healthy.  It may help to prevent some diseases.  It helps your heart and other muscles work well.  You can get vitamin D by:  Eating foods with vitamin D in them.  Drinking or eating milk or other foods that have had vitamin D added to them.  Taking a vitamin D supplement.  Being in the sun.  Not getting enough vitamin D can make your bones become soft. It can also cause other health problems. Follow these instructions at home:  Take medicines and supplements only as told by your doctor.  Eat foods that have vitamin D. These include: ? Dairy products, cereals, or juices with added vitamin D. Check the label for vitamin D. ? Fatty fish like salmon or trout. ? Eggs. ? Oysters.  Do not use tanning beds.  Stay at a healthy weight. Lose weight, if needed.  Keep all follow-up visits as told by your doctor. This is important. Contact a doctor if:  Your symptoms do not go away.  You feel sick to your stomach (nauseous).  Youthrow up (vomit).  You poop less often than usual or you have trouble pooping (constipation). This information is not intended to replace advice given to you by your health care provider. Make sure you discuss any questions you have with your health care provider. Document Released: 12/16/2010 Document Revised: 06/04/2015 Document Reviewed: 05/14/2014 Elsevier Interactive Patient Education  [employer].

## 2016-09-18 NOTE — Assessment & Plan Note (Signed)
Tried cryotherapy previously but they did not fall off. The area is cleaned with alcohol, then area frozen with cryotherapy and then the 3 lesions are removed. Bleeding is minimial and stopped with silver nitrate.

## 2016-10-03 ENCOUNTER — Other Ambulatory Visit: Payer: Self-pay | Admitting: Family Medicine

## 2016-10-06 ENCOUNTER — Encounter: Payer: Self-pay | Admitting: Family Medicine

## 2016-10-06 ENCOUNTER — Ambulatory Visit (INDEPENDENT_AMBULATORY_CARE_PROVIDER_SITE_OTHER): Payer: Medicare Other | Admitting: Family Medicine

## 2016-10-06 VITALS — BP 118/80 | HR 97 | Temp 98.3°F | Ht 67.0 in | Wt 258.2 lb

## 2016-10-06 DIAGNOSIS — J069 Acute upper respiratory infection, unspecified: Secondary | ICD-10-CM | POA: Diagnosis not present

## 2016-10-06 DIAGNOSIS — B9789 Other viral agents as the cause of diseases classified elsewhere: Secondary | ICD-10-CM | POA: Diagnosis not present

## 2016-10-06 MED ORDER — FLUTICASONE PROPIONATE 50 MCG/ACT NA SUSP: 2.0000 | Freq: Every day | NASAL | 2 refills | Status: DC

## 2016-10-06 MED ORDER — BENZONATATE 100 MG PO CAPS: 100.0000 mg | ORAL_CAPSULE | Freq: Three times a day (TID) | ORAL | 0 refills | Status: DC | PRN

## 2016-10-06 NOTE — Progress Notes (Signed)
Pre visit review using our clinic review tool, if applicable. No additional management support is needed unless otherwise documented below in the visit note. 

## 2016-10-06 NOTE — Progress Notes (Signed)
Chief Complaint  Patient presents with  . Cough    congestion    Shelly Coss here for URI complaints.  Duration: 1 week  Associated symptoms: sinus congestion, rhinorrhea and cough Denies: sinus pain, itchy watery eyes, ear pain, ear drainage, sore throat, shortness of breath, myalgia and fevers/rigors Treatment to date: Mucinex  Sick contacts: No  States he is around 60-70% better.  ROS:   Const: Denies fevers HEENT: As noted in HPI Lungs: No SOB  Past Medical History:  Diagnosis Date  . Abnormal LFTs 09/06/2011  . Acute bronchitis 08/16/2010  . Anxiety 09/06/2011  . Chicken pox as a child  . Cutaneous skin tags 07/27/2016  . Fatigue   . Gout   . Gout   . Grief reaction 09/09/2012  . Hyperglycemia 09/06/2011  . Hyperlipidemia 09/06/2011  . Hypertension   . Insomnia 08/16/2010  . Knee pain, acute 02/28/2012  . Low testosterone 02/28/2012  . Measles as a child  . Mumps as a child  . Nocturia 08/16/2010  . Obese   . Preventative health care 09/06/2011  . Sleep apnea 09/06/2011  . Unspecified vitamin D deficiency 02/28/2012   Family History  Problem Relation Age of Onset  . Diabetes Mother        type 2  . Cancer Maternal Grandmother        throat  . Heart disease Maternal Grandfather   . Diabetes Maternal Grandfather   . Depression Daughter        anxiety  . Graves' disease Daughter   . Colon polyps Neg Hx     BP 118/80 (BP Location: Left Arm, Patient Position: Sitting, Cuff Size: Large)   Pulse 97   Temp 98.3 F (36.8 C) (Oral)   Ht 5\' 7"  (1.702 m)   Wt 258 lb 4 oz (117.1 kg)   SpO2 96%   BMI 40.45 kg/m  General: Awake, alert, appears stated age HEENT: AT, , ears patent b/l and TM's neg, nares patent w/o discharge, pharynx pink and without exudates, MMM Neck: No masses or asymmetry Heart: RRR, no murmurs, no bruits Lungs: CTAB, no accessory muscle use Psych: Age appropriate judgment and insight, normal mood and affect  Viral URI with cough - Plan:  benzonatate (TESSALON) 100 MG capsule, fluticasone (FLONASE) 50 MCG/ACT nasal spray  Orders as above. Continue to push fluids, practice good hand hygiene, cover mouth when coughing. F/u prn. If starting to experience fevers, shaking, or shortness of breath, seek immediate care. Pt voiced understanding and agreement to the plan.  Daisy, DO 10/06/16 2:11 PM

## 2016-10-06 NOTE — Patient Instructions (Addendum)
Continue to push fluids, practice good hand hygiene, and cover your mouth if you cough.  If you start having fevers, shaking or shortness of breath, seek immediate care.  Use Flonase daily to help prevent issues with your ears when you fly.   Grab a couple face masks on your way out.  OK to keep using Mucinex.  Let us know if you need anything.

## 2016-12-16 ENCOUNTER — Ambulatory Visit: Payer: Medicare Other | Admitting: Family Medicine

## 2016-12-26 ENCOUNTER — Telehealth: Payer: Self-pay | Admitting: Family Medicine

## 2016-12-26 NOTE — Telephone Encounter (Signed)
Copied from [place] [id number]. Topic: Quick Communication - See Telephone Encounter >> Dec 26, 2016  3:30 PM Boyd Kerbs wrote: CRM for notification. See Telephone encounter for:  David Esparza from [facility] [phone] called saying they have faxed many times a request refill for Fluocinolone with no response.   Pt. Has not had this cream since 2015, but has had a flare up.  Question if would need an appt. To be seen .  12/26/16.

## 2016-12-27 NOTE — Telephone Encounter (Signed)
Left message to call back to discuss requested ointment (eye?) and any symptoms he may be having.

## 2017-01-02 ENCOUNTER — Other Ambulatory Visit: Payer: Self-pay | Admitting: Family Medicine

## 2017-01-02 ENCOUNTER — Telehealth: Payer: Self-pay | Admitting: Family Medicine

## 2017-01-02 NOTE — Telephone Encounter (Signed)
Copied from [place]. Topic: Quick Communication - See Telephone Encounter >> Jan 02, 2017  9:27 AM Ether Griffins B wrote: CRM for notification. See Telephone encounter for:  [facility] calling about a refill on fluocinolone. Dr. Charlett Blake isnt the original prescribing doc but needing a refill on it. Can provider sign off. Apply BID PRN to infected area.  01/02/17.

## 2017-01-04 NOTE — Telephone Encounter (Signed)
Please advise in PCP absence.  

## 2017-01-04 NOTE — Telephone Encounter (Signed)
Unable to prescribe, recommend to call the original prescriber

## 2017-03-31 ENCOUNTER — Other Ambulatory Visit: Payer: Self-pay | Admitting: Family Medicine

## 2017-04-04 ENCOUNTER — Other Ambulatory Visit: Payer: Self-pay | Admitting: Family Medicine

## 2017-04-20 ENCOUNTER — Telehealth: Payer: Self-pay | Admitting: Family Medicine

## 2017-04-20 DIAGNOSIS — E349 Endocrine disorder, unspecified: Secondary | ICD-10-CM

## 2017-04-20 DIAGNOSIS — E559 Vitamin D deficiency, unspecified: Secondary | ICD-10-CM

## 2017-04-20 DIAGNOSIS — I10 Essential (primary) hypertension: Secondary | ICD-10-CM

## 2017-04-20 DIAGNOSIS — R739 Hyperglycemia, unspecified: Secondary | ICD-10-CM

## 2017-04-20 DIAGNOSIS — R5383 Other fatigue: Secondary | ICD-10-CM

## 2017-04-20 DIAGNOSIS — E669 Obesity, unspecified: Secondary | ICD-10-CM

## 2017-04-20 DIAGNOSIS — E785 Hyperlipidemia, unspecified: Secondary | ICD-10-CM

## 2017-04-20 NOTE — Telephone Encounter (Signed)
Called pt to reschedule his AWV with [facility]. Pt stated that he is due for a physical and wanted to know if he could come earlier that week of the new appt to get blood work done, so that re can get the results back at his appt. I informed him that orders would need to be placed before I could schedule a lab appt. Pt requested a call back to let him know if this was possible and to schedule the appt.   Please advise  CB: [phone]

## 2017-04-20 NOTE — Telephone Encounter (Signed)
Other than basic 4 any other lab work that needs to be done   Please advise

## 2017-04-20 NOTE — Telephone Encounter (Signed)
Also add a vitamin D for low vit d and hgba1c for d deficiency and hyperglycemia

## 2017-04-21 NOTE — Telephone Encounter (Signed)
Spoke with patient about lab orders placed him on schedule for 07/04/17 at 8 am. Patient agreed and voiced his understanding.

## 2017-05-10 ENCOUNTER — Telehealth: Payer: Self-pay | Admitting: Family Medicine

## 2017-05-10 NOTE — Telephone Encounter (Signed)
Called pt to reschedule his AWV with [facility]. Pt stated that he would return our call tomorrow when he has access to his calender.

## 2017-07-04 ENCOUNTER — Telehealth: Payer: Self-pay | Admitting: Family Medicine

## 2017-07-04 NOTE — Telephone Encounter (Signed)
Copied from [place] [phone]. Topic: Inquiry >> Jul 04, 2017 11:29 AM Cecelia Byars, NT wrote: Reason for CRM: Patient would like to schedule a physical with Dr Charlett Blake , he would like to know if he can see he sooner than has available if possible or get a physical from someone else .

## 2017-07-04 NOTE — Telephone Encounter (Signed)
Patient can schedule with another physician in office

## 2017-07-05 ENCOUNTER — Ambulatory Visit: Payer: Medicare Other | Admitting: *Deleted

## 2017-07-05 NOTE — Telephone Encounter (Signed)
Scheduled appt with David Esparza on Aug 12th @ 3pm. He wants to be checked over and go over his blood panel.

## 2017-07-06 ENCOUNTER — Other Ambulatory Visit: Payer: Medicare Other

## 2017-07-12 NOTE — Progress Notes (Addendum)
Subjective:   David Esparza is a 67 y.o. male who presents for Medicare Annual/Subsequent preventive examination.  Pt still working from home 60hrs per week.  Review of Systems: No ROS.  Medicare Wellness Visit. Additional risk factors are reflected in the social history. Cardiac Risk Factors include: advanced age (>3men, >70 women);dyslipidemia;hypertension;male gender;obesity (BMI >30kg/m2) Sleep patterns:  Sleep patterns vary. Naps as needed.  Home Safety/Smoke Alarms: Feels safe in home. Smoke alarms in place.  Living environment; residence and Firearm Safety: Lives with wife in 1 story.   Male:   CCS-    Due 09/2020 PSA-  Lab Results  Component Value Date   PSA 1.31 07/05/2016   PSA 0.57 11/10/2014   PSA 0.50 09/11/2012       Objective:    Vitals: BP 135/89 (BP Location: Left Arm, Patient Position: Sitting, Cuff Size: Normal)   Pulse 85   Ht 5\' 7"  (1.702 m)   Wt 264 lb 6.4 oz (119.9 kg)   SpO2 97%   BMI 41.41 kg/m   Body mass index is 41.41 kg/m.  Advanced Directives 07/18/2017 07/04/2016  Does Patient Have a Medical Advance Directive? No No  Would patient like information on creating a medical advance directive? Yes (MAU/Ambulatory/Procedural Areas - Information given) Yes (MAU/Ambulatory/Procedural Areas - Information given)    Tobacco Social History   Tobacco Use  Smoking Status Former Smoker  . Types: Cigarettes  . Last attempt to quit: 08/30/1968  . Years since quitting: 48.9  Smokeless Tobacco Never Used  Tobacco Comment   smoked in high school     Counseling given: Not Answered Comment: smoked in high school   Clinical Intake: Pain : No/denies pain    Past Medical History:  Diagnosis Date  . Abnormal LFTs 09/06/2011  . Acute bronchitis 08/16/2010  . Anxiety 09/06/2011  . Chicken pox as a child  . Cutaneous skin tags 07/27/2016  . Fatigue   . Gout   . Gout   . Grief reaction 09/09/2012  . Hyperglycemia 09/06/2011  . Hyperlipidemia 09/06/2011   . Hypertension   . Insomnia 08/16/2010  . Knee pain, acute 02/28/2012  . Low testosterone 02/28/2012  . Measles as a child  . Mumps as a child  . Nocturia 08/16/2010  . Obese   . Preventative health care 09/06/2011  . Sleep apnea 09/06/2011  . Unspecified vitamin D deficiency 02/28/2012   Past Surgical History:  Procedure Laterality Date  . BACK SURGERY    . COLONOSCOPY  09/16/10   Dr. Deatra Ina: moderate diverticulosis sigmoid and descending colon, o/w normal: repeat 10 yrs.  . intestines sewn  67 yrs old   shot once made 6 holes  . TONSILLECTOMY  as a child  . VASECTOMY  73 yrs old   Family History  Problem Relation Age of Onset  . Diabetes Mother        type 2  . Cancer Maternal Grandmother        throat  . Heart disease Maternal Grandfather   . Diabetes Maternal Grandfather   . Depression Daughter        anxiety  . Graves' disease Daughter   . Colon polyps Neg Hx    Social History   Socioeconomic History  . Marital status: Married    Spouse name: Not on file  . Number of children: Not on file  . Years of education: Not on file  . Highest education level: Not on file  Occupational History  . Not  on file  Social Needs  . Financial resource strain: Not on file  . Food insecurity:    Worry: Not on file    Inability: Not on file  . Transportation needs:    Medical: Not on file    Non-medical: Not on file  Tobacco Use  . Smoking status: Former Smoker    Types: Cigarettes    Last attempt to quit: 08/30/1968    Years since quitting: 48.9  . Smokeless tobacco: Never Used  . Tobacco comment: smoked in high school  Substance and Sexual Activity  . Alcohol use: No    Comment: very little  . Drug use: No  . Sexual activity: Yes    Partners: Female  Lifestyle  . Physical activity:    Days per week: Not on file    Minutes per session: Not on file  . Stress: Not on file  Relationships  . Social connections:    Talks on phone: Not on file    Gets together: Not on file      Attends religious service: Not on file    Active member of club or organization: Not on file    Attends meetings of clubs or organizations: Not on file    Relationship status: Not on file  Other Topics Concern  . Not on file  Social History Narrative  . Not on file    Outpatient Encounter Medications as of 07/18/2017  Medication Sig  . allopurinol (ZYLOPRIM) 300 MG tablet TAKE 1 TABLET BY MOUTH DAILY.  Marland Kitchen amLODipine (NORVASC) 5 MG tablet TAKE 1 TABLET BY MOUTH DAILY.  Marland Kitchen atorvastatin (LIPITOR) 10 MG tablet Take 1 tablet (10 mg total) by mouth at bedtime.  Marland Kitchen b complex vitamins capsule Take 1 capsule by mouth every other day.  . colchicine 0.6 MG tablet Take 1 tablet (0.6 mg total) by mouth daily. Gout Flare-up  . fish oil-omega-3 fatty acids 1000 MG capsule Take 2 g by mouth daily as needed.   Marland Kitchen losartan-hydrochlorothiazide (HYZAAR) 100-25 MG tablet TAKE 1 TABLET BY MOUTH DAILY.  . multivitamin (THERAGRAN) tablet Take 1 tablet by mouth daily.    . sildenafil (VIAGRA) 100 MG tablet Take 0.5-1 tablets (50-100 mg total) by mouth daily as needed for erectile dysfunction.  . [DISCONTINUED] amLODipine (NORVASC) 5 MG tablet TAKE 1 TABLET BY MOUTH DAILY.  . [DISCONTINUED] benzonatate (TESSALON) 100 MG capsule Take 1 capsule (100 mg total) by mouth 3 (three) times daily as needed. (Patient not taking: Reported on 07/18/2017)  . [DISCONTINUED] fluticasone (FLONASE) 50 MCG/ACT nasal spray Place 2 sprays into both nostrils daily.   No facility-administered encounter medications on file as of 07/18/2017.     Activities of Daily Living In your present state of health, do you have any difficulty performing the following activities: 07/18/2017  Hearing? Y  Comment pt states he had hearing test and is going to be getting hearing aids.   Vision? N  Difficulty concentrating or making decisions? N  Walking or climbing stairs? N  Dressing or bathing? N  Doing errands, shopping? N  Preparing Food and eating  ? N  Using the Toilet? N  In the past six months, have you accidently leaked urine? N  Do you have problems with loss of bowel control? N  Managing your Medications? N  Managing your Finances? N  Housekeeping or managing your Housekeeping? N  Some recent data might be hidden    Patient Care Team: Mosie Lukes, MD as PCP -  General (Family Medicine)   Assessment:   This is a routine wellness examination for Farhan. Physical assessment deferred to PCP.  Exercise Activities and Dietary recommendations Current Exercise Habits: Structured exercise class, Type of exercise: treadmill;strength training/weights, Time (Minutes): 45, Frequency (Times/Week): 4, Weekly Exercise (Minutes/Week): 180, Intensity: Mild, Exercise limited by: None identified Diet (meal preparation, eat out, water intake, caffeinated beverages, dairy products, fruits and vegetables): in general, a "healthy" diet  , well balanced, on average, 3 meals per day    Goals    . Weight (lb) < 240 lb (108.9 kg) (pt-stated)       Fall Risk Fall Risk  07/18/2017 08/01/2016 07/04/2016 11/18/2014  Falls in the past year? No No No No  Comment - Emmi Telephone Survey: data to providers prior to load - -    Depression Screen PHQ 2/9 Scores 07/18/2017 07/04/2016  PHQ - 2 Score 0 0    Cognitive Function Ad8 score reviewed for issues:  Issues making decisions:no  Less interest in hobbies / activities:no  Repeats questions, stories (family complaining):no  Trouble using ordinary gadgets (microwave, computer, phone):no  Forgets the month or year: no  Mismanaging finances: no  Remembering appts:no  Daily problems with thinking and/or memory:no Ad8 score is=0         There is no immunization history on file for this patient.    Screening Tests Health Maintenance  Topic Date Due  . TETANUS/TDAP  06/06/1968  . PNA vac Low Risk Adult (2 of 2 - PPSV23) 11/18/2015  . INFLUENZA VACCINE  08/10/2017  . COLONOSCOPY   09/15/2020  . Hepatitis C Screening  Completed       Plan:    Please schedule your next medicare wellness visit with me in 1 yr.  Bring a copy of your living will and/or healthcare power of attorney to your next office visit.  Please review pneumonia vaccine information sheet and let us know if you would like one at your appt in August.  Continue to eat heart healthy diet (full of fruits, vegetables, whole grains, lean protein, water--limit salt, fat, and sugar intake) and increase physical activity as tolerated.    I have personally reviewed and noted the following in the patient's chart:   . Medical and social history . Use of alcohol, tobacco or illicit drugs  . Current medications and supplements . Functional ability and status . Nutritional status . Physical activity . Advanced directives . List of other physicians . Hospitalizations, surgeries, and ER visits in previous 12 months . Vitals . Screenings to include cognitive, depression, and falls . Referrals and appointments  In addition, I have reviewed and discussed with patient certain preventive protocols, quality metrics, and best practice recommendations. A written personalized care plan for preventive services as well as general preventive health recommendations were provided to patient.     Shela Nevin, [place]  07/18/2017 Medical screening examination/treatment was performed by qualified clinical staff member and as supervising physician I was immediately available for consultation/collaboration. I have reviewed documentation and agree with assessment and plan.  Penni Homans, MD

## 2017-07-14 ENCOUNTER — Ambulatory Visit: Payer: Medicare Other | Admitting: *Deleted

## 2017-07-18 ENCOUNTER — Ambulatory Visit (INDEPENDENT_AMBULATORY_CARE_PROVIDER_SITE_OTHER): Payer: Medicare Other | Admitting: *Deleted

## 2017-07-18 ENCOUNTER — Other Ambulatory Visit (INDEPENDENT_AMBULATORY_CARE_PROVIDER_SITE_OTHER): Payer: Medicare Other

## 2017-07-18 ENCOUNTER — Encounter: Payer: Self-pay | Admitting: *Deleted

## 2017-07-18 VITALS — BP 135/89 | HR 85 | Ht 67.0 in | Wt 264.4 lb

## 2017-07-18 DIAGNOSIS — E559 Vitamin D deficiency, unspecified: Secondary | ICD-10-CM | POA: Diagnosis not present

## 2017-07-18 DIAGNOSIS — R350 Frequency of micturition: Secondary | ICD-10-CM

## 2017-07-18 DIAGNOSIS — Z0000 Encounter for general adult medical examination without abnormal findings: Secondary | ICD-10-CM | POA: Diagnosis not present

## 2017-07-18 DIAGNOSIS — I10 Essential (primary) hypertension: Secondary | ICD-10-CM

## 2017-07-18 DIAGNOSIS — E349 Endocrine disorder, unspecified: Secondary | ICD-10-CM | POA: Diagnosis not present

## 2017-07-18 DIAGNOSIS — R5383 Other fatigue: Secondary | ICD-10-CM | POA: Diagnosis not present

## 2017-07-18 DIAGNOSIS — E785 Hyperlipidemia, unspecified: Secondary | ICD-10-CM

## 2017-07-18 DIAGNOSIS — E669 Obesity, unspecified: Secondary | ICD-10-CM | POA: Diagnosis not present

## 2017-07-18 DIAGNOSIS — R739 Hyperglycemia, unspecified: Secondary | ICD-10-CM | POA: Diagnosis not present

## 2017-07-18 NOTE — Addendum Note (Signed)
Addended by: Harl Bowie on: 07/18/2017 04:00 PM   Modules accepted: Orders

## 2017-07-18 NOTE — Patient Instructions (Addendum)
Please schedule your next medicare wellness visit with me in 1 yr.  Bring a copy of your living will and/or healthcare power of attorney to your next office visit.  Please review pneumonia vaccine information sheet and let us know if you would like one at your appt in August.  Continue to eat heart healthy diet (full of fruits, vegetables, whole grains, lean protein, water--limit salt, fat, and sugar intake) and increase physical activity as tolerated.   [name] , Thank you for taking time to come for your Medicare Wellness Visit. I appreciate your ongoing commitment to your health goals. Please review the following plan we discussed and let me know if I can assist you in the future.   These are the goals we discussed: Goals    . Weight (lb) < 240 lb (108.9 kg) (pt-stated)       This is a list of the screening recommended for you and due dates:  Health Maintenance  Topic Date Due  . Tetanus Vaccine  06/06/1968  . Pneumonia vaccines (2 of 2 - PPSV23) 09/08/2017*  . Flu Shot  08/10/2017  . Colon Cancer Screening  09/15/2020  .  Hepatitis C: One time screening is recommended by Center for Disease Control  (CDC) for  adults born from [birth year] through 1966.   Completed  *Topic was postponed. The date shown is not the original due date.    Health Maintenance, Male A healthy lifestyle and preventive care is important for your health and wellness. Ask your health care provider about what schedule of regular examinations is right for you. What should I know about weight and diet? Eat a Healthy Diet  Eat plenty of vegetables, fruits, whole grains, low-fat dairy products, and lean protein.  Do not eat a lot of foods high in solid fats, added sugars, or salt.  Maintain a Healthy Weight Regular exercise can help you achieve or maintain a healthy weight. You should:  Do at least 150 minutes of exercise each week. The exercise should increase your heart rate and make you sweat  (moderate-intensity exercise).  Do strength-training exercises at least twice a week.  Watch Your Levels of Cholesterol and Blood Lipids  Have your blood tested for lipids and cholesterol every 5 years starting at 68 years of age. If you are at high risk for heart disease, you should start having your blood tested when you are 68 years old. You may need to have your cholesterol levels checked more often if: ? Your lipid or cholesterol levels are high. ? You are older than 68 years of age. ? You are at high risk for heart disease.  What should I know about cancer screening? Many types of cancers can be detected early and may often be prevented. Lung Cancer  You should be screened every year for lung cancer if: ? You are a current smoker who has smoked for at least 30 years. ? You are a former smoker who has quit within the past 15 years.  Talk to your health care provider about your screening options, when you should start screening, and how often you should be screened.  Colorectal Cancer  Routine colorectal cancer screening usually begins at 68 years of age and should be repeated every 5-10 years until you are 68 years old. You may need to be screened more often if early forms of precancerous polyps or small growths are found. Your health care provider may recommend screening at an earlier age if you have  risk factors for colon cancer.  Your health care provider may recommend using home test kits to check for hidden blood in the stool.  A small camera at the end of a tube can be used to examine your colon (sigmoidoscopy or colonoscopy). This checks for the earliest forms of colorectal cancer.  Prostate and Testicular Cancer  Depending on your age and overall health, your health care provider may do certain tests to screen for prostate and testicular cancer.  Talk to your health care provider about any symptoms or concerns you have about testicular or prostate cancer.  Skin  Cancer  Check your skin from head to toe regularly.  Tell your health care provider about any new moles or changes in moles, especially if: ? There is a change in a mole's size, shape, or color. ? You have a mole that is larger than a pencil eraser.  Always use sunscreen. Apply sunscreen liberally and repeat throughout the day.  Protect yourself by wearing long sleeves, pants, a wide-brimmed hat, and sunglasses when outside.  What should I know about heart disease, diabetes, and high blood pressure?  If you are 54-57 years of age, have your blood pressure checked every 3-5 years. If you are 17 years of age or older, have your blood pressure checked every year. You should have your blood pressure measured twice-once when you are at a hospital or clinic, and once when you are not at a hospital or clinic. Record the average of the two measurements. To check your blood pressure when you are not at a hospital or clinic, you can use: ? An automated blood pressure machine at a pharmacy. ? A home blood pressure monitor.  Talk to your health care provider about your target blood pressure.  If you are between 72-80 years old, ask your health care provider if you should take aspirin to prevent heart disease.  Have regular diabetes screenings by checking your fasting blood sugar level. ? If you are at a normal weight and have a low risk for diabetes, have this test once every three years after the age of 57. ? If you are overweight and have a high risk for diabetes, consider being tested at a younger age or more often.  A one-time screening for abdominal aortic aneurysm (AAA) by ultrasound is recommended for men aged 36-75 years who are current or former smokers. What should I know about preventing infection? Hepatitis B If you have a higher risk for hepatitis B, you should be screened for this virus. Talk with your health care provider to find out if you are at risk for hepatitis B  infection. Hepatitis C Blood testing is recommended for:  Everyone born from [birth year] through 1966.  Anyone with known risk factors for hepatitis C.  Sexually Transmitted Diseases (STDs)  You should be screened each year for STDs including gonorrhea and chlamydia if: ? You are sexually active and are younger than 68 years of age. ? You are older than 68 years of age and your health care provider tells you that you are at risk for this type of infection. ? Your sexual activity has changed since you were last screened and you are at an increased risk for chlamydia or gonorrhea. Ask your health care provider if you are at risk.  Talk with your health care provider about whether you are at high risk of being infected with HIV. Your health care provider may recommend a prescription medicine to help prevent HIV infection.  What else can I do?  Schedule regular health, dental, and eye exams.  Stay current with your vaccines (immunizations).  Do not use any tobacco products, such as cigarettes, chewing tobacco, and e-cigarettes. If you need help quitting, ask your health care provider.  Limit alcohol intake to no more than 2 drinks per day. One drink equals 12 ounces of beer, 5 ounces of wine, or 1 ounces of hard liquor.  Do not use street drugs.  Do not share needles.  Ask your health care provider for help if you need support or information about quitting drugs.  Tell your health care provider if you often feel depressed.  Tell your health care provider if you have ever been abused or do not feel safe at home. This information is not intended to replace advice given to you by your health care provider. Make sure you discuss any questions you have with your health care provider. Document Released: 06/25/2007 Document Revised: 08/26/2015 Document Reviewed: 09/30/2014 Elsevier Interactive Patient Education  [employer].

## 2017-07-18 NOTE — Addendum Note (Signed)
Addended by: Harl Bowie on: 07/18/2017 04:03 PM   Modules accepted: Orders

## 2017-07-19 LAB — CBC
HEMATOCRIT: 45.8 % (ref 39.0–52.0)
HEMOGLOBIN: 16.1 g/dL (ref 13.0–17.0)
MCHC: 35.1 g/dL (ref 30.0–36.0)
MCV: 97.7 fl (ref 78.0–100.0)
PLATELETS: 96 10*3/uL — AB (ref 150.0–400.0)
RBC: 4.69 Mil/uL (ref 4.22–5.81)
RDW: 13.8 % (ref 11.5–15.5)
WBC: 6.3 10*3/uL (ref 4.0–10.5)

## 2017-07-19 LAB — LIPID PANEL
Cholesterol: 181 mg/dL (ref 0–200)
HDL: 45.1 mg/dL (ref >39.00)
LDL CALC: 101 mg/dL — AB (ref 0–99)
NonHDL: 135.61
Total CHOL/HDL Ratio: 4
Triglycerides: 174 mg/dL — ABNORMAL HIGH (ref 0.0–149.0)
VLDL: 34.8 mg/dL (ref 0.0–40.0)

## 2017-07-19 LAB — COMPREHENSIVE METABOLIC PANEL
ALBUMIN: 4.2 g/dL (ref 3.5–5.2)
ALT: 72 U/L — ABNORMAL HIGH (ref 0–53)
AST: 58 U/L — ABNORMAL HIGH (ref 0–37)
Alkaline Phosphatase: 148 U/L — ABNORMAL HIGH (ref 39–117)
BUN: 17 mg/dL (ref 6–23)
CALCIUM: 9.4 mg/dL (ref 8.4–10.5)
CHLORIDE: 104 meq/L (ref 96–112)
CO2: 28 mEq/L (ref 19–32)
Creatinine, Ser: 0.97 mg/dL (ref 0.40–1.50)
GFR: 81.78 mL/min (ref >60.00)
Glucose, Bld: 115 mg/dL — ABNORMAL HIGH (ref 70–99)
POTASSIUM: 4.3 meq/L (ref 3.5–5.1)
SODIUM: 141 meq/L (ref 135–145)
Total Bilirubin: 1 mg/dL (ref 0.2–1.2)
Total Protein: 7.2 g/dL (ref 6.0–8.3)

## 2017-07-19 LAB — TSH: TSH: 2.59 u[IU]/mL (ref 0.35–4.50)

## 2017-07-19 LAB — HEMOGLOBIN A1C: HEMOGLOBIN A1C: 6.5 % (ref 4.6–6.5)

## 2017-07-19 LAB — VITAMIN D 25 HYDROXY (VIT D DEFICIENCY, FRACTURES): VITD: 28.62 ng/mL — ABNORMAL LOW (ref 30.00–100.00)

## 2017-07-21 LAB — PSA, MEDICARE: PSA: 0.58 ng/ml (ref 0.10–4.00)

## 2017-08-21 ENCOUNTER — Telehealth: Payer: Self-pay

## 2017-08-21 ENCOUNTER — Ambulatory Visit (INDEPENDENT_AMBULATORY_CARE_PROVIDER_SITE_OTHER): Payer: Medicare Other | Admitting: Family Medicine

## 2017-08-21 VITALS — BP 152/82 | HR 92 | Temp 98.4°F | Resp 18 | Ht 67.0 in | Wt 266.8 lb

## 2017-08-21 DIAGNOSIS — E669 Obesity, unspecified: Secondary | ICD-10-CM

## 2017-08-21 DIAGNOSIS — R7989 Other specified abnormal findings of blood chemistry: Secondary | ICD-10-CM

## 2017-08-21 DIAGNOSIS — R0600 Dyspnea, unspecified: Secondary | ICD-10-CM | POA: Diagnosis not present

## 2017-08-21 DIAGNOSIS — N529 Male erectile dysfunction, unspecified: Secondary | ICD-10-CM | POA: Diagnosis not present

## 2017-08-21 DIAGNOSIS — R739 Hyperglycemia, unspecified: Secondary | ICD-10-CM

## 2017-08-21 DIAGNOSIS — E559 Vitamin D deficiency, unspecified: Secondary | ICD-10-CM | POA: Diagnosis not present

## 2017-08-21 DIAGNOSIS — Z79899 Other long term (current) drug therapy: Secondary | ICD-10-CM | POA: Diagnosis not present

## 2017-08-21 DIAGNOSIS — E785 Hyperlipidemia, unspecified: Secondary | ICD-10-CM | POA: Diagnosis not present

## 2017-08-21 DIAGNOSIS — I10 Essential (primary) hypertension: Secondary | ICD-10-CM | POA: Diagnosis not present

## 2017-08-21 DIAGNOSIS — F419 Anxiety disorder, unspecified: Secondary | ICD-10-CM | POA: Diagnosis not present

## 2017-08-21 MED ORDER — SILDENAFIL CITRATE 100 MG PO TABS: 50.0000 mg | ORAL_TABLET | Freq: Every day | ORAL | 2 refills | Status: DC | PRN

## 2017-08-21 MED ORDER — FLUOCINOLONE ACETONIDE 0.01 % EX CREA: TOPICAL_CREAM | Freq: Two times a day (BID) | CUTANEOUS | 1 refills | Status: DC

## 2017-08-21 MED ORDER — ALLOPURINOL 300 MG PO TABS: 300.0000 mg | ORAL_TABLET | Freq: Every day | ORAL | 1 refills | Status: DC

## 2017-08-21 MED ORDER — ALPRAZOLAM 1 MG PO TABS: 1.0000 mg | ORAL_TABLET | Freq: Two times a day (BID) | ORAL | 1 refills | Status: DC | PRN

## 2017-08-21 MED ORDER — LOSARTAN POTASSIUM-HCTZ 100-25 MG PO TABS: 1.0000 | ORAL_TABLET | Freq: Every day | ORAL | 1 refills | Status: DC

## 2017-08-21 NOTE — Telephone Encounter (Signed)
PA initiated via Covermymeds; KEY: [id number]. Awaiting determination.

## 2017-08-21 NOTE — Assessment & Plan Note (Signed)
Will check level with next blood draw 

## 2017-08-21 NOTE — Assessment & Plan Note (Signed)
hgba1c acceptable, minimize simple carbs. Increase exercise as tolerated.  

## 2017-08-21 NOTE — Assessment & Plan Note (Signed)
Encouraged DASH diet, decrease po intake and increase exercise as tolerated. Needs 7-8 hours of sleep nightly. Avoid trans fats, eat small, frequent meals every 4-5 hours with lean proteins, complex carbs and healthy fats. Minimize simple carbs, referred to healthy weight and wellness 

## 2017-08-21 NOTE — Patient Instructions (Addendum)
Add a fiber supplement such as Benefiber once to twice a day  kegel exercises to count of 3 sets of 10 twice a day.   Steps 4000 to 7000 daily  Satiety diet or MIND diet or DASH diet for weight loss  Witch Hazel Astringent DASH Eating Plan DASH stands for "Dietary Approaches to Stop Hypertension." The DASH eating plan is a healthy eating plan that has been shown to reduce high blood pressure (hypertension). It may also reduce your risk for type 2 diabetes, heart disease, and stroke. The DASH eating plan may also help with weight loss. What are tips for following this plan? General guidelines  Avoid eating more than 2,300 mg (milligrams) of salt (sodium) a day. If you have hypertension, you may need to reduce your sodium intake to 1,500 mg a day.  Limit alcohol intake to no more than 1 drink a day for nonpregnant women and 2 drinks a day for men. One drink equals 12 oz of beer, 5 oz of wine, or 1 oz of hard liquor.  Work with your health care provider to maintain a healthy body weight or to lose weight. Ask what an ideal weight is for you.  Get at least 30 minutes of exercise that causes your heart to beat faster (aerobic exercise) most days of the week. Activities may include walking, swimming, or biking.  Work with your health care provider or diet and nutrition specialist (dietitian) to adjust your eating plan to your individual calorie needs. Reading food labels  Check food labels for the amount of sodium per serving. Choose foods with less than 5 percent of the Daily Value of sodium. Generally, foods with less than 300 mg of sodium per serving fit into this eating plan.  To find whole grains, look for the word "whole" as the first word in the ingredient list. Shopping  Buy products labeled as "low-sodium" or "no salt added."  Buy fresh foods. Avoid canned foods and premade or frozen meals. Cooking  Avoid adding salt when cooking. Use salt-free seasonings or herbs instead of  table salt or sea salt. Check with your health care provider or pharmacist before using salt substitutes.  Do not fry foods. Cook foods using healthy methods such as baking, boiling, grilling, and broiling instead.  Cook with heart-healthy oils, such as olive, canola, soybean, or sunflower oil. Meal planning   Eat a balanced diet that includes: ? 5 or more servings of fruits and vegetables each day. At each meal, try to fill half of your plate with fruits and vegetables. ? Up to 6-8 servings of whole grains each day. ? Less than 6 oz of lean meat, poultry, or fish each day. A 3-oz serving of meat is about the same size as a deck of cards. One egg equals 1 oz. ? 2 servings of low-fat dairy each day. ? A serving of nuts, seeds, or beans 5 times each week. ? Heart-healthy fats. Healthy fats called Omega-3 fatty acids are found in foods such as flaxseeds and coldwater fish, like sardines, salmon, and mackerel.  Limit how much you eat of the following: ? Canned or prepackaged foods. ? Food that is high in trans fat, such as fried foods. ? Food that is high in saturated fat, such as fatty meat. ? Sweets, desserts, sugary drinks, and other foods with added sugar. ? Full-fat dairy products.  Do not salt foods before eating.  Try to eat at least 2 vegetarian meals each week.  Eat more  food and less restaurant, buffet, and fast food.  When eating at a restaurant, ask that your food be prepared with less salt or no salt, if possible. What foods are recommended? The items listed may not be a complete list. Talk with your dietitian about what dietary choices are best for you. Grains Whole-grain or whole-wheat bread. Whole-grain or whole-wheat pasta. Brown rice. Oatmeal. Quinoa. Bulgur. Whole-grain and low-sodium cereals. Pita bread. Low-fat, low-sodium crackers. Whole-wheat flour tortillas. Vegetables Fresh or frozen vegetables (raw, steamed, roasted, or grilled). Low-sodium or  reduced-sodium tomato and vegetable juice. Low-sodium or reduced-sodium tomato sauce and tomato paste. Low-sodium or reduced-sodium canned vegetables. Fruits All fresh, dried, or frozen fruit. Canned fruit in natural juice (without added sugar). Meat and other protein foods Skinless chicken or turkey. Ground chicken or turkey. Pork with fat trimmed off. Fish and seafood. Egg whites. Dried beans, peas, or lentils. Unsalted nuts, nut butters, and seeds. Unsalted canned beans. Lean cuts of beef with fat trimmed off. Low-sodium, lean deli meat. Dairy Low-fat (1%) or fat-free (skim) milk. Fat-free, low-fat, or reduced-fat cheeses. Nonfat, low-sodium ricotta or cottage cheese. Low-fat or nonfat yogurt. Low-fat, low-sodium cheese. Fats and oils Soft margarine without trans fats. Vegetable oil. Low-fat, reduced-fat, or light mayonnaise and salad dressings (reduced-sodium). Canola, safflower, olive, soybean, and sunflower oils. Avocado. Seasoning and other foods Herbs. Spices. Seasoning mixes without salt. Unsalted popcorn and pretzels. Fat-free sweets. What foods are not recommended? The items listed may not be a complete list. Talk with your dietitian about what dietary choices are best for you. Grains Baked goods made with fat, such as croissants, muffins, or some breads. Dry pasta or rice meal packs. Vegetables Creamed or fried vegetables. Vegetables in a cheese sauce. Regular canned vegetables (not low-sodium or reduced-sodium). Regular canned tomato sauce and paste (not low-sodium or reduced-sodium). Regular tomato and vegetable juice (not low-sodium or reduced-sodium). Pickles. Olives. Fruits Canned fruit in a light or heavy syrup. Fried fruit. Fruit in cream or butter sauce. Meat and other protein foods Fatty cuts of meat. Ribs. Fried meat. Bacon. Sausage. Bologna and other processed lunch meats. Salami. Fatback. Hotdogs. Bratwurst. Salted nuts and seeds. Canned beans with added salt. Canned or  smoked fish. Whole eggs or egg yolks. Chicken or turkey with skin. Dairy Whole or 2% milk, cream, and half-and-half. Whole or full-fat cream cheese. Whole-fat or sweetened yogurt. Full-fat cheese. Nondairy creamers. Whipped toppings. Processed cheese and cheese spreads. Fats and oils Butter. Stick margarine. Lard. Shortening. Ghee. Bacon fat. Tropical oils, such as coconut, palm kernel, or palm oil. Seasoning and other foods Salted popcorn and pretzels. Onion salt, garlic salt, seasoned salt, table salt, and sea salt. Worcestershire sauce. Tartar sauce. Barbecue sauce. Teriyaki sauce. Soy sauce, including reduced-sodium. Steak sauce. Canned and packaged gravies. Fish sauce. Oyster sauce. Cocktail sauce. Horseradish that you find on the shelf. Ketchup. Mustard. Meat flavorings and tenderizers. Bouillon cubes. Hot sauce and Tabasco sauce. Premade or packaged marinades. Premade or packaged taco seasonings. Relishes. Regular salad dressings. Where to find more information:  National Heart, Lung, and Blood Institute: www.nhlbi.nih.gov  American Heart Association: www.heart.org Summary  The DASH eating plan is a healthy eating plan that has been shown to reduce high blood pressure (hypertension). It may also reduce your risk for type 2 diabetes, heart disease, and stroke.  With the DASH eating plan, you should limit salt (sodium) intake to 2,300 mg a day. If you have hypertension, you may need to reduce your sodium intake to 1,500 mg   mg a day.  When on the DASH eating plan, aim to eat more fresh fruits and vegetables, whole grains, lean proteins, low-fat dairy, and heart-healthy fats.  Work with your health care provider or diet and nutrition specialist (dietitian) to adjust your eating plan to your individual calorie needs. This information is not intended to replace advice given to you by your health care provider. Make sure you discuss any questions you have with your health care provider. Document  Released: 12/16/2010 Document Revised: 12/21/2015 Document Reviewed: 12/21/2015 Elsevier Interactive Patient Education  [employer].

## 2017-08-21 NOTE — Assessment & Plan Note (Signed)
Well controlled, no changes to meds. Encouraged heart healthy diet such as the DASH diet and exercise as tolerated.  °

## 2017-08-21 NOTE — Assessment & Plan Note (Signed)
Encouraged heart healthy diet, increase exercise, avoid trans fats, consider a krill oil cap daily 

## 2017-08-21 NOTE — Assessment & Plan Note (Signed)
Monitor and supplement 

## 2017-08-21 NOTE — Progress Notes (Signed)
Subjective:    Patient ID: David Esparza, male    DOB: 31-Jul-1950, 67 y.o.   MRN: [id number]  No chief complaint on file.   HPI Patient is in today for follow up. He has recently begun to exercise again and is trying to improve his diet and minimize his carbohydrates while maintaining a heat healthy diet. No recent febrile illness or hospitalizations. No polyufia or polydipsia. Denies CP/palp/SOB/HA/congestion/fevers/GI or GU c/o. Taking meds as prescribed  Past Medical History:  Diagnosis Date  . Abnormal LFTs 09/06/2011  . Acute bronchitis 08/16/2010  . Anxiety 09/06/2011  . Chicken pox as a child  . Cutaneous skin tags 07/27/2016  . Fatigue   . Gout   . Gout   . Grief reaction 09/09/2012  . Hyperglycemia 09/06/2011  . Hyperlipidemia 09/06/2011  . Hypertension   . Insomnia 08/16/2010  . Knee pain, acute 02/28/2012  . Low testosterone 02/28/2012  . Measles as a child  . Mumps as a child  . Nocturia 08/16/2010  . Obese   . Preventative health care 09/06/2011  . Sleep apnea 09/06/2011  . Unspecified vitamin D deficiency 02/28/2012    Past Surgical History:  Procedure Laterality Date  . BACK SURGERY    . COLONOSCOPY  09/16/10   Dr. Deatra Ina: moderate diverticulosis sigmoid and descending colon, o/w normal: repeat 10 yrs.  . intestines sewn  67 yrs old   shot once made 6 holes  . TONSILLECTOMY  as a child  . VASECTOMY  74 yrs old    Family History  Problem Relation Age of Onset  . Diabetes Mother        type 2  . Cancer Maternal Grandmother        throat  . Heart disease Maternal Grandfather   . Diabetes Maternal Grandfather   . Depression Daughter        anxiety  . Graves' disease Daughter   . Colon polyps Neg Hx     Social History   Socioeconomic History  . Marital status: Married    Spouse name: Not on file  . Number of children: Not on file  . Years of education: Not on file  . Highest education level: Not on file  Occupational History  . Not on file  Social  Needs  . Financial resource strain: Not on file  . Food insecurity:    Worry: Not on file    Inability: Not on file  . Transportation needs:    Medical: Not on file    Non-medical: Not on file  Tobacco Use  . Smoking status: Former Smoker    Types: Cigarettes    Last attempt to quit: 08/30/1968    Years since quitting: 49.0  . Smokeless tobacco: Never Used  . Tobacco comment: smoked in high school  Substance and Sexual Activity  . Alcohol use: No    Comment: very little  . Drug use: No  . Sexual activity: Yes    Partners: Female  Lifestyle  . Physical activity:    Days per week: Not on file    Minutes per session: Not on file  . Stress: Not on file  Relationships  . Social connections:    Talks on phone: Not on file    Gets together: Not on file    Attends religious service: Not on file    Active member of club or organization: Not on file    Attends meetings of clubs or organizations: Not on file  Relationship status: Not on file  . Intimate partner violence:    Fear of current or ex partner: Not on file    Emotionally abused: Not on file    Physically abused: Not on file    Forced sexual activity: Not on file  Other Topics Concern  . Not on file  Social History Narrative  . Not on file    Outpatient Medications Prior to Visit  Medication Sig Dispense Refill  . amLODipine (NORVASC) 5 MG tablet TAKE 1 TABLET BY MOUTH DAILY. 90 tablet 1  . atorvastatin (LIPITOR) 10 MG tablet Take 1 tablet (10 mg total) by mouth at bedtime. 30 tablet 5  . b complex vitamins capsule Take 1 capsule by mouth every other day.    . colchicine 0.6 MG tablet Take 1 tablet (0.6 mg total) by mouth daily. Gout Flare-up 30 tablet 3  . fish oil-omega-3 fatty acids 1000 MG capsule Take 2 g by mouth daily as needed.     . multivitamin (THERAGRAN) tablet Take 1 tablet by mouth daily.      Marland Kitchen allopurinol (ZYLOPRIM) 300 MG tablet TAKE 1 TABLET BY MOUTH DAILY. 90 tablet 0  .  losartan-hydrochlorothiazide (HYZAAR) 100-25 MG tablet TAKE 1 TABLET BY MOUTH DAILY. 90 tablet 1  . sildenafil (VIAGRA) 100 MG tablet Take 0.5-1 tablets (50-100 mg total) by mouth daily as needed for erectile dysfunction. 30 tablet 2   No facility-administered medications prior to visit.     No Known Allergies  Review of Systems  Constitutional: Positive for malaise/fatigue. Negative for fever.  HENT: Negative for congestion.   Eyes: Negative for blurred vision.  Respiratory: Negative for shortness of breath.   Cardiovascular: Negative for chest pain, palpitations and leg swelling.  Gastrointestinal: Negative for abdominal pain, blood in stool and nausea.  Genitourinary: Negative for dysuria and frequency.  Musculoskeletal: Negative for falls.  Skin: Negative for rash.  Neurological: Negative for dizziness, loss of consciousness and headaches.  Endo/Heme/Allergies: Negative for environmental allergies.  Psychiatric/Behavioral: Negative for depression. The patient is not nervous/anxious.        Objective:    Physical Exam  Constitutional: He is oriented to person, place, and time. He appears well-developed and well-nourished. No distress.  HENT:  Head: Normocephalic and atraumatic.  Nose: Nose normal.  Eyes: Right eye exhibits no discharge. Left eye exhibits no discharge.  Neck: Normal range of motion. Neck supple.  Cardiovascular: Normal rate and regular rhythm.  No murmur heard. Pulmonary/Chest: Effort normal and breath sounds normal.  Abdominal: Soft. Bowel sounds are normal. There is no tenderness.  Musculoskeletal: He exhibits no edema.  Neurological: He is alert and oriented to person, place, and time.  Skin: Skin is warm and dry.  Psychiatric: He has a normal mood and affect.  Nursing note and vitals reviewed.   BP (!) 152/82 (BP Location: Left Arm, Patient Position: Sitting, Cuff Size: Large)   Pulse 92   Temp 98.4 F (36.9 C) (Oral)   Resp 18   Ht 5\' 7"  (1.702  m)   Wt 266 lb 12.8 oz (121 kg)   SpO2 96%   BMI 41.79 kg/m  Wt Readings from Last 3 Encounters:  08/21/17 266 lb 12.8 oz (121 kg)  07/18/17 264 lb 6.4 oz (119.9 kg)  10/06/16 258 lb 4 oz (117.1 kg)     Lab Results  Component Value Date   WBC 6.3 07/18/2017   HGB 16.1 07/18/2017   HCT 45.8 07/18/2017   PLT 96.0 (  L) 07/18/2017   GLUCOSE 115 (H) 07/18/2017   CHOL 181 07/18/2017   TRIG 174.0 (H) 07/18/2017   HDL 45.10 07/18/2017   LDLDIRECT 72.0 07/05/2016   LDLCALC 101 (H) 07/18/2017   ALT 72 (H) 07/18/2017   AST 58 (H) 07/18/2017   NA 141 07/18/2017   K 4.3 07/18/2017   CL 104 07/18/2017   CREATININE 0.97 07/18/2017   BUN 17 07/18/2017   CO2 28 07/18/2017   TSH 2.59 07/18/2017   PSA 0.58 07/18/2017   HGBA1C 6.5 07/18/2017   MICROALBUR 1.55 02/21/2012    Lab Results  Component Value Date   TSH 2.59 07/18/2017   Lab Results  Component Value Date   WBC 6.3 07/18/2017   HGB 16.1 07/18/2017   HCT 45.8 07/18/2017   MCV 97.7 07/18/2017   PLT 96.0 (L) 07/18/2017   Lab Results  Component Value Date   NA 141 07/18/2017   K 4.3 07/18/2017   CO2 28 07/18/2017   GLUCOSE 115 (H) 07/18/2017   BUN 17 07/18/2017   CREATININE 0.97 07/18/2017   BILITOT 1.0 07/18/2017   ALKPHOS 148 (H) 07/18/2017   AST 58 (H) 07/18/2017   ALT 72 (H) 07/18/2017   PROT 7.2 07/18/2017   ALBUMIN 4.2 07/18/2017   CALCIUM 9.4 07/18/2017   GFR 81.78 07/18/2017   Lab Results  Component Value Date   CHOL 181 07/18/2017   Lab Results  Component Value Date   HDL 45.10 07/18/2017   Lab Results  Component Value Date   LDLCALC 101 (H) 07/18/2017   Lab Results  Component Value Date   TRIG 174.0 (H) 07/18/2017   Lab Results  Component Value Date   CHOLHDL 4 07/18/2017   Lab Results  Component Value Date   HGBA1C 6.5 07/18/2017       Assessment & Plan:   Problem List Items Addressed This Visit    Hypertension    Well controlled, no changes to meds. Encouraged heart healthy  diet such as the DASH diet and exercise as tolerated.       Relevant Medications   losartan-hydrochlorothiazide (HYZAAR) 100-25 MG tablet   sildenafil (VIAGRA) 100 MG tablet   Other Relevant Orders   CBC   Comprehensive metabolic panel   TSH   Obese    Encouraged DASH diet, decrease po intake and increase exercise as tolerated. Needs 7-8 hours of sleep nightly. Avoid trans fats, eat small, frequent meals every 4-5 hours with lean proteins, complex carbs and healthy fats. Minimize simple carbs, referred to healthy weight and wellness      Anxiety   Relevant Medications   ALPRAZolam (XANAX) 1 MG tablet   Hyperglycemia    hgba1c acceptable, minimize simple carbs. Increase exercise as tolerated.       Relevant Medications   ALPRAZolam (XANAX) 1 MG tablet   Other Relevant Orders   Hemoglobin A1c   Hyperlipidemia    Encouraged heart healthy diet, increase exercise, avoid trans fats, consider a krill oil cap daily      Relevant Medications   losartan-hydrochlorothiazide (HYZAAR) 100-25 MG tablet   sildenafil (VIAGRA) 100 MG tablet   Other Relevant Orders   Lipid panel   Low testosterone    Will check level with next blood draw      Relevant Orders   Testosterone   Vitamin D deficiency    Monitor and supplement      Relevant Orders   VITAMIN D 25 Hydroxy (Vit-D Deficiency, Fractures)   Erectile  dysfunction   Relevant Medications   sildenafil (VIAGRA) 100 MG tablet    Other Visit Diagnoses    High risk medication use    -  Primary   Relevant Orders   Pain Mgmt, Profile 8 w/Conf, U   Dyspnea, unspecified type       Relevant Orders   ECHOCARDIOGRAM COMPLETE   Morbid obesity ([place])       Relevant Orders   Amb Ref to Medical Weight Management      I have changed Worth L. Mathwig "Sam"'s allopurinol. I am also having him start on fluocinolone. Additionally, I am having him maintain his multivitamin, fish oil-omega-3 fatty acids, b complex vitamins, colchicine,  atorvastatin, amLODipine, ALPRAZolam, losartan-hydrochlorothiazide, and sildenafil.  Meds ordered this encounter  Medications  . ALPRAZolam (XANAX) 1 MG tablet    Sig: Take 1 tablet (1 mg total) by mouth 2 (two) times daily as needed for sleep.    Dispense:  30 tablet    Refill:  1  . allopurinol (ZYLOPRIM) 300 MG tablet    Sig: Take 1 tablet (300 mg total) by mouth daily.    Dispense:  90 tablet    Refill:  1  . losartan-hydrochlorothiazide (HYZAAR) 100-25 MG tablet    Sig: Take 1 tablet by mouth daily.    Dispense:  90 tablet    Refill:  1  . fluocinolone (VANOS) 0.01 % cream    Sig: Apply topically 2 (two) times daily.    Dispense:  30 g    Refill:  1  . sildenafil (VIAGRA) 100 MG tablet    Sig: Take 0.5-1 tablets (50-100 mg total) by mouth daily as needed for erectile dysfunction.    Dispense:  30 tablet    Refill:  2    CMA served as scribe during this visit. History, Physical and Plan performed by medical provider. Documentation and orders reviewed and attested to.  Penni Homans, MD

## 2017-08-22 LAB — PAIN MGMT, PROFILE 8 W/CONF, U
6 Acetylmorphine: NEGATIVE ng/mL (ref <10)
AMPHETAMINES: NEGATIVE ng/mL (ref <500)
Alcohol Metabolites: NEGATIVE ng/mL (ref <500)
Benzodiazepines: NEGATIVE ng/mL (ref <100)
Buprenorphine, Urine: NEGATIVE ng/mL (ref <5)
CREATININE: 137.9 mg/dL
Cocaine Metabolite: NEGATIVE ng/mL (ref <150)
MARIJUANA METABOLITE: NEGATIVE ng/mL (ref <20)
MDMA: NEGATIVE ng/mL (ref <500)
OPIATES: NEGATIVE ng/mL (ref <100)
Oxidant: NEGATIVE ug/mL (ref <200)
Oxycodone: NEGATIVE ng/mL (ref <100)
pH: 6.8 (ref 4.5–9.0)

## 2017-08-22 NOTE — Telephone Encounter (Signed)
PA approved. Effective 05/23/2017 to 08/21/2018.

## 2017-08-23 ENCOUNTER — Telehealth: Payer: Self-pay | Admitting: Family Medicine

## 2017-08-23 NOTE — Telephone Encounter (Signed)
Copied from [phone]. Topic: Quick Communication - See Telephone Encounter >> Aug 23, 2017  4:48 PM Vernona Rieger wrote: CRM for notification. See Telephone encounter for: 08/23/17.  Patient states he can not make the appt for the echo on 8/16 and needs someone to call him back to change that. Call back # [phone]

## 2017-08-25 ENCOUNTER — Ambulatory Visit (HOSPITAL_BASED_OUTPATIENT_CLINIC_OR_DEPARTMENT_OTHER): Admission: RE | Admit: 2017-08-25 | Payer: Medicare Other | Source: Ambulatory Visit

## 2017-08-28 NOTE — Telephone Encounter (Signed)
Called the number provided and person answered the phone stated I had the wrong number. I called the number again thinking I had dialed the wrong number and it was still the wrong number.

## 2017-08-29 ENCOUNTER — Telehealth: Payer: Self-pay | Admitting: Family Medicine

## 2017-08-29 NOTE — Telephone Encounter (Signed)
Copied from [place] [id number]. Topic: Quick Communication - See Telephone Encounter >> Aug 23, 2017  4:48 PM Vernona Rieger wrote: CRM for notification. See Telephone encounter for: 08/23/17.  Patient states he can not make the appt for the echo on 8/16 and needs someone to call him back to change that. Call back # [phone]

## 2017-08-30 NOTE — Telephone Encounter (Signed)
See additional phone note from the same day.

## 2017-08-30 NOTE — Telephone Encounter (Signed)
Left detailed message on pt's voicemail to call the Vascular lab at [phone] to r/s this appointment and to call us if any further questions.

## 2017-09-04 ENCOUNTER — Ambulatory Visit (HOSPITAL_BASED_OUTPATIENT_CLINIC_OR_DEPARTMENT_OTHER)
Admission: RE | Admit: 2017-09-04 | Discharge: 2017-09-04 | Disposition: A | Discharge status: Home / Self Care | Payer: Medicare Other | Source: Ambulatory Visit | Attending: Family Medicine | Admitting: Family Medicine

## 2017-09-04 DIAGNOSIS — E785 Hyperlipidemia, unspecified: Secondary | ICD-10-CM | POA: Diagnosis not present

## 2017-09-04 DIAGNOSIS — R0600 Dyspnea, unspecified: Secondary | ICD-10-CM | POA: Insufficient documentation

## 2017-09-04 DIAGNOSIS — I10 Essential (primary) hypertension: Secondary | ICD-10-CM | POA: Insufficient documentation

## 2017-09-04 NOTE — Progress Notes (Signed)
  Echocardiogram 2D Echocardiogram has been performed.  David Esparza [name] 09/04/2017, 11:26 AM

## 2017-09-05 ENCOUNTER — Telehealth: Payer: Self-pay | Admitting: Family Medicine

## 2017-09-05 NOTE — Telephone Encounter (Signed)
I have called the numbers listed on the patients chart and they either belong to a business or are disconnected.   Will send a letter to patient to update phone number.

## 2018-01-08 ENCOUNTER — Ambulatory Visit (INDEPENDENT_AMBULATORY_CARE_PROVIDER_SITE_OTHER): Payer: Medicare Other | Admitting: Family Medicine

## 2018-01-08 DIAGNOSIS — M109 Gout, unspecified: Secondary | ICD-10-CM

## 2018-01-08 DIAGNOSIS — R32 Unspecified urinary incontinence: Secondary | ICD-10-CM | POA: Diagnosis not present

## 2018-01-08 DIAGNOSIS — R7989 Other specified abnormal findings of blood chemistry: Secondary | ICD-10-CM

## 2018-01-08 DIAGNOSIS — E669 Obesity, unspecified: Secondary | ICD-10-CM

## 2018-01-08 DIAGNOSIS — E785 Hyperlipidemia, unspecified: Secondary | ICD-10-CM | POA: Diagnosis not present

## 2018-01-08 DIAGNOSIS — R945 Abnormal results of liver function studies: Secondary | ICD-10-CM

## 2018-01-08 DIAGNOSIS — R748 Abnormal levels of other serum enzymes: Secondary | ICD-10-CM

## 2018-01-08 DIAGNOSIS — K760 Fatty (change of) liver, not elsewhere classified: Secondary | ICD-10-CM

## 2018-01-08 DIAGNOSIS — I10 Essential (primary) hypertension: Secondary | ICD-10-CM

## 2018-01-08 DIAGNOSIS — E559 Vitamin D deficiency, unspecified: Secondary | ICD-10-CM

## 2018-01-08 DIAGNOSIS — E349 Endocrine disorder, unspecified: Secondary | ICD-10-CM | POA: Diagnosis not present

## 2018-01-08 DIAGNOSIS — R739 Hyperglycemia, unspecified: Secondary | ICD-10-CM | POA: Diagnosis not present

## 2018-01-08 NOTE — Assessment & Plan Note (Signed)
Will monitor

## 2018-01-08 NOTE — Assessment & Plan Note (Signed)
hgba1c acceptable, minimize simple carbs. Increase exercise as tolerated.  

## 2018-01-08 NOTE — Patient Instructions (Addendum)
MIND diet  Flu shot  Shingrix is the new shingles shot, 2 shots over 2-6 months  Prevnar then Pneumovax a year later prevents some pneumonia DASH Eating Plan DASH stands for "Dietary Approaches to Stop Hypertension." The DASH eating plan is a healthy eating plan that has been shown to reduce high blood pressure (hypertension). It may also reduce your risk for type 2 diabetes, heart disease, and stroke. The DASH eating plan may also help with weight loss. What are tips for following this plan?  General guidelines  Avoid eating more than 2,300 mg (milligrams) of salt (sodium) a day. If you have hypertension, you may need to reduce your sodium intake to 1,500 mg a day.  Limit alcohol intake to no more than 1 drink a day for nonpregnant women and 2 drinks a day for men. One drink equals 12 oz of beer, 5 oz of wine, or 1 oz of hard liquor.  Work with your health care provider to maintain a healthy body weight or to lose weight. Ask what an ideal weight is for you.  Get at least 30 minutes of exercise that causes your heart to beat faster (aerobic exercise) most days of the week. Activities may include walking, swimming, or biking.  Work with your health care provider or diet and nutrition specialist (dietitian) to adjust your eating plan to your individual calorie needs. Reading food labels   Check food labels for the amount of sodium per serving. Choose foods with less than 5 percent of the Daily Value of sodium. Generally, foods with less than 300 mg of sodium per serving fit into this eating plan.  To find whole grains, look for the word "whole" as the first word in the ingredient list. Shopping  Buy products labeled as "low-sodium" or "no salt added."  Buy fresh foods. Avoid canned foods and premade or frozen meals. Cooking  Avoid adding salt when cooking. Use salt-free seasonings or herbs instead of table salt or sea salt. Check with your health care provider or pharmacist before  using salt substitutes.  Do not fry foods. Cook foods using healthy methods such as baking, boiling, grilling, and broiling instead.  Cook with heart-healthy oils, such as olive, canola, soybean, or sunflower oil. Meal planning  Eat a balanced diet that includes: ? 5 or more servings of fruits and vegetables each day. At each meal, try to fill half of your plate with fruits and vegetables. ? Up to 6-8 servings of whole grains each day. ? Less than 6 oz of lean meat, poultry, or fish each day. A 3-oz serving of meat is about the same size as a deck of cards. One egg equals 1 oz. ? 2 servings of low-fat dairy each day. ? A serving of nuts, seeds, or beans 5 times each week. ? Heart-healthy fats. Healthy fats called Omega-3 fatty acids are found in foods such as flaxseeds and coldwater fish, like sardines, salmon, and mackerel.  Limit how much you eat of the following: ? Canned or prepackaged foods. ? Food that is high in trans fat, such as fried foods. ? Food that is high in saturated fat, such as fatty meat. ? Sweets, desserts, sugary drinks, and other foods with added sugar. ? Full-fat dairy products.  Do not salt foods before eating.  Try to eat at least 2 vegetarian meals each week.  Eat more home-cooked food and less restaurant, buffet, and fast food.  When eating at a restaurant, ask that your food be  prepared with less salt or no salt, if possible. What foods are recommended? The items listed may not be a complete list. Talk with your dietitian about what dietary choices are best for you. Grains Whole-grain or whole-wheat bread. Whole-grain or whole-wheat pasta. Brown rice. [place]. Bulgur. Whole-grain and low-sodium cereals. Pita bread. Low-fat, low-sodium crackers. Whole-wheat flour tortillas. Vegetables Fresh or frozen vegetables (raw, steamed, roasted, or grilled). Low-sodium or reduced-sodium tomato and vegetable juice. Low-sodium or reduced-sodium tomato sauce and  tomato paste. Low-sodium or reduced-sodium canned vegetables. Fruits All fresh, dried, or frozen fruit. Canned fruit in natural juice (without added sugar). Meat and other protein foods Skinless chicken or [place]. Ground chicken or [place]. Pork with fat trimmed off. Fish and seafood. Egg whites. Dried beans, peas, or lentils. Unsalted nuts, nut butters, and seeds. Unsalted canned beans. Lean cuts of beef with fat trimmed off. Low-sodium, lean deli meat. Dairy Low-fat (1%) or fat-free (skim) milk. Fat-free, low-fat, or reduced-fat cheeses. Nonfat, low-sodium ricotta or cottage cheese. Low-fat or nonfat yogurt. Low-fat, low-sodium cheese. Fats and oils Soft margarine without trans fats. Vegetable oil. Low-fat, reduced-fat, or light mayonnaise and salad dressings (reduced-sodium). Canola, safflower, olive, soybean, and sunflower oils. Avocado. Seasoning and other foods Herbs. Spices. Seasoning mixes without salt. Unsalted popcorn and pretzels. Fat-free sweets. What foods are not recommended? The items listed may not be a complete list. Talk with your dietitian about what dietary choices are best for you. Grains Baked goods made with fat, such as croissants, muffins, or some breads. Dry pasta or rice meal packs. Vegetables Creamed or fried vegetables. Vegetables in a cheese sauce. Regular canned vegetables (not low-sodium or reduced-sodium). Regular canned tomato sauce and paste (not low-sodium or reduced-sodium). Regular tomato and vegetable juice (not low-sodium or reduced-sodium). Angie Fava. Olives. Fruits Canned fruit in a light or heavy syrup. Fried fruit. Fruit in cream or butter sauce. Meat and other protein foods Fatty cuts of meat. Ribs. Fried meat. Berniece Salines. Sausage. Bologna and other processed lunch meats. Salami. Fatback. Hotdogs. Bratwurst. Salted nuts and seeds. Canned beans with added salt. Canned or smoked fish. Whole eggs or egg yolks. Chicken or [place] with skin. Dairy Whole or 2%  milk, cream, and half-and-half. Whole or full-fat cream cheese. Whole-fat or sweetened yogurt. Full-fat cheese. Nondairy creamers. Whipped toppings. Processed cheese and cheese spreads. Fats and oils Butter. Stick margarine. Lard. Shortening. Ghee. Bacon fat. Tropical oils, such as coconut, palm kernel, or palm oil. Seasoning and other foods Salted popcorn and pretzels. Onion salt, garlic salt, seasoned salt, table salt, and sea salt. Worcestershire sauce. Tartar sauce. Barbecue sauce. Teriyaki sauce. Soy sauce, including reduced-sodium. Steak sauce. Canned and packaged gravies. Fish sauce. Oyster sauce. Cocktail sauce. Horseradish that you find on the shelf. Ketchup. Mustard. Meat flavorings and tenderizers. Bouillon cubes. Hot sauce and Tabasco sauce. Premade or packaged marinades. Premade or packaged taco seasonings. Relishes. Regular salad dressings. Where to find more information:  National Heart, Lung, and [place]: https://wilson-eaton.com/  American Heart Association: www.heart.org Summary  The DASH eating plan is a healthy eating plan that has been shown to reduce high blood pressure (hypertension). It may also reduce your risk for type 2 diabetes, heart disease, and stroke.  With the DASH eating plan, you should limit salt (sodium) intake to 2,300 mg a day. If you have hypertension, you may need to reduce your sodium intake to 1,500 mg a day.  When on the DASH eating plan, aim to eat more fresh fruits and vegetables, whole grains,  lean proteins, low-fat dairy, and heart-healthy fats.  Work with your health care provider or diet and nutrition specialist (dietitian) to adjust your eating plan to your individual calorie needs. This information is not intended to replace advice given to you by your health care provider. Make sure you discuss any questions you have with your health care provider. Document Released: 12/16/2010 Document Revised: 12/21/2015 Document Reviewed:  12/21/2015 Elsevier Interactive Patient Education  2019 [employer].

## 2018-01-08 NOTE — Assessment & Plan Note (Signed)
Encouraged heart healthy diet, increase exercise, avoid trans fats, consider a krill oil cap daily 

## 2018-01-08 NOTE — Assessment & Plan Note (Signed)
Encouraged DASH diet, decrease po intake and increase exercise as tolerated. Needs 7-8 hours of sleep nightly. Avoid trans fats, eat small, frequent meals every 4-5 hours with lean proteins, complex carbs and healthy fats. Minimize simple carbs 

## 2018-01-08 NOTE — Assessment & Plan Note (Signed)
Supplement and monitor 

## 2018-01-09 LAB — LIPID PANEL
CHOLESTEROL: 168 mg/dL (ref 0–200)
HDL: 39.3 mg/dL (ref >39.00)
NonHDL: 128.34
Total CHOL/HDL Ratio: 4
Triglycerides: 358 mg/dL — ABNORMAL HIGH (ref 0.0–149.0)
VLDL: 71.6 mg/dL — ABNORMAL HIGH (ref 0.0–40.0)

## 2018-01-09 LAB — COMPREHENSIVE METABOLIC PANEL
ALT: 60 U/L — ABNORMAL HIGH (ref 0–53)
AST: 60 U/L — ABNORMAL HIGH (ref 0–37)
Albumin: 4.2 g/dL (ref 3.5–5.2)
Alkaline Phosphatase: 136 U/L — ABNORMAL HIGH (ref 39–117)
BUN: 23 mg/dL (ref 6–23)
CO2: 26 mEq/L (ref 19–32)
Calcium: 9.5 mg/dL (ref 8.4–10.5)
Chloride: 104 mEq/L (ref 96–112)
Creatinine, Ser: 1.35 mg/dL (ref 0.40–1.50)
GFR: 55.76 mL/min — AB (ref >60.00)
Glucose, Bld: 144 mg/dL — ABNORMAL HIGH (ref 70–99)
Potassium: 3.9 mEq/L (ref 3.5–5.1)
Sodium: 139 mEq/L (ref 135–145)
Total Bilirubin: 0.8 mg/dL (ref 0.2–1.2)
Total Protein: 7.1 g/dL (ref 6.0–8.3)

## 2018-01-09 LAB — URIC ACID: Uric Acid, Serum: 5.2 mg/dL (ref 4.0–7.8)

## 2018-01-09 LAB — HEMOGLOBIN A1C: HEMOGLOBIN A1C: 6.7 % — AB (ref 4.6–6.5)

## 2018-01-09 LAB — VITAMIN D 25 HYDROXY (VIT D DEFICIENCY, FRACTURES): VITD: 21.33 ng/mL — ABNORMAL LOW (ref 30.00–100.00)

## 2018-01-09 LAB — CBC
HCT: 47.2 % (ref 39.0–52.0)
Hemoglobin: 16.7 g/dL (ref 13.0–17.0)
MCHC: 35.4 g/dL (ref 30.0–36.0)
MCV: 96 fl (ref 78.0–100.0)
Platelets: 92 10*3/uL — ABNORMAL LOW (ref 150.0–400.0)
RBC: 4.92 Mil/uL (ref 4.22–5.81)
RDW: 13.5 % (ref 11.5–15.5)
WBC: 6.6 10*3/uL (ref 4.0–10.5)

## 2018-01-09 LAB — LDL CHOLESTEROL, DIRECT: Direct LDL: 87 mg/dL

## 2018-01-09 LAB — URINE CULTURE
MICRO NUMBER:: 91550912
Result:: NO GROWTH
SPECIMEN QUALITY:: ADEQUATE

## 2018-01-09 LAB — TSH: TSH: 2.71 u[IU]/mL (ref 0.35–4.50)

## 2018-01-09 LAB — TESTOSTERONE: Testosterone: 344.91 ng/dL (ref 300.00–890.00)

## 2018-01-09 LAB — PSA: PSA: 0.6 ng/mL (ref 0.10–4.00)

## 2018-01-09 LAB — LACTATE DEHYDROGENASE: LDH: 201 U/L (ref 120–250)

## 2018-01-11 ENCOUNTER — Telehealth: Payer: Self-pay | Admitting: *Deleted

## 2018-01-11 DIAGNOSIS — R748 Abnormal levels of other serum enzymes: Secondary | ICD-10-CM

## 2018-01-11 DIAGNOSIS — K760 Fatty (change of) liver, not elsewhere classified: Secondary | ICD-10-CM | POA: Insufficient documentation

## 2018-01-11 NOTE — Assessment & Plan Note (Signed)
Well controlled, no changes to meds. Encouraged heart healthy diet such as the DASH diet and exercise as tolerated.  °

## 2018-01-11 NOTE — Assessment & Plan Note (Signed)
Minimize simple carbs and fatty foods, stay active and monitor

## 2018-01-11 NOTE — Assessment & Plan Note (Signed)
Minimize simple carbs and fatty foods

## 2018-01-11 NOTE — Progress Notes (Signed)
Subjective:    Patient ID: David Esparza, male    DOB: April 17, 1950, 68 y.o.   MRN: [id number]  No chief complaint on file.   HPI Patient is in today for follow up. No recent febrile illness or hospitalizations. David Esparza is frustrated with his weight but denies any other acute concerns. David Esparza does note an increase in urinary urgency and fecal urgency. No incontinence or blood noted. Denies CP/palp/SOB/HA/congestion/fevers/GI or GU c/o. Taking meds as prescribed  Past Medical History:  Diagnosis Date  . Abnormal LFTs 09/06/2011  . Acute bronchitis 08/16/2010  . Anxiety 09/06/2011  . Chicken pox as a child  . Cutaneous skin tags 07/27/2016  . Fatigue   . Gout   . Gout   . Grief reaction 09/09/2012  . Hyperglycemia 09/06/2011  . Hyperlipidemia 09/06/2011  . Hypertension   . Insomnia 08/16/2010  . Knee pain, acute 02/28/2012  . Low testosterone 02/28/2012  . Measles as a child  . Mumps as a child  . Nocturia 08/16/2010  . Obese   . Preventative health care 09/06/2011  . Sleep apnea 09/06/2011  . Unspecified vitamin D deficiency 02/28/2012    Past Surgical History:  Procedure Laterality Date  . BACK SURGERY    . COLONOSCOPY  09/16/10   Dr. Deatra Ina: moderate diverticulosis sigmoid and descending colon, o/w normal: repeat 10 yrs.  . intestines sewn  68 yrs old   shot once made 6 holes  . TONSILLECTOMY  as a child  . VASECTOMY  62 yrs old    Family History  Problem Relation Age of Onset  . Diabetes Mother        type 2  . Cancer Maternal Grandmother        throat  . Heart disease Maternal Grandfather   . Diabetes Maternal Grandfather   . Depression Daughter        anxiety  . Graves' disease Daughter   . Colon polyps Neg Hx     Social History   Socioeconomic History  . Marital status: Married    Spouse name: Not on file  . Number of children: Not on file  . Years of education: Not on file  . Highest education level: Not on file  Occupational History  . Not on file  Social Needs  .  Financial resource strain: Not on file  . Food insecurity:    Worry: Not on file    Inability: Not on file  . Transportation needs:    Medical: Not on file    Non-medical: Not on file  Tobacco Use  . Smoking status: Former Smoker    Types: Cigarettes    Last attempt to quit: 08/30/1968    Years since quitting: 49.4  . Smokeless tobacco: Never Used  . Tobacco comment: smoked in high school  Substance and Sexual Activity  . Alcohol use: No    Comment: very little  . Drug use: No  . Sexual activity: Yes    Partners: Female  Lifestyle  . Physical activity:    Days per week: Not on file    Minutes per session: Not on file  . Stress: Not on file  Relationships  . Social connections:    Talks on phone: Not on file    Gets together: Not on file    Attends religious service: Not on file    Active member of club or organization: Not on file    Attends meetings of clubs or organizations: Not on file  Relationship status: Not on file  . Intimate partner violence:    Fear of current or ex partner: Not on file    Emotionally abused: Not on file    Physically abused: Not on file    Forced sexual activity: Not on file  Other Topics Concern  . Not on file  Social History Narrative  . Not on file    Outpatient Medications Prior to Visit  Medication Sig Dispense Refill  . allopurinol (ZYLOPRIM) 300 MG tablet Take 1 tablet (300 mg total) by mouth daily. 90 tablet 1  . amLODipine (NORVASC) 5 MG tablet TAKE 1 TABLET BY MOUTH DAILY. 90 tablet 1  . atorvastatin (LIPITOR) 10 MG tablet Take 1 tablet (10 mg total) by mouth at bedtime. 30 tablet 5  . b complex vitamins capsule Take 1 capsule by mouth every other day.    . colchicine 0.6 MG tablet Take 1 tablet (0.6 mg total) by mouth daily. Gout Flare-up 30 tablet 3  . fish oil-omega-3 fatty acids 1000 MG capsule Take 2 g by mouth daily as needed.     . fluocinolone (VANOS) 0.01 % cream Apply topically 2 (two) times daily. 30 g 1  .  losartan-hydrochlorothiazide (HYZAAR) 100-25 MG tablet Take 1 tablet by mouth daily. 90 tablet 1  . multivitamin (THERAGRAN) tablet Take 1 tablet by mouth daily.      . sildenafil (VIAGRA) 100 MG tablet Take 0.5-1 tablets (50-100 mg total) by mouth daily as needed for erectile dysfunction. 30 tablet 2   No facility-administered medications prior to visit.     No Known Allergies  Review of Systems  Constitutional: Positive for malaise/fatigue. Negative for chills and fever.  HENT: Negative for congestion and hearing loss.   Eyes: Negative for discharge.  Respiratory: Negative for cough, sputum production and shortness of breath.   Cardiovascular: Negative for chest pain, palpitations and leg swelling.  Gastrointestinal: Negative for abdominal pain, blood in stool, constipation, diarrhea, heartburn, nausea and vomiting.  Genitourinary: Negative for dysuria, frequency, hematuria and urgency.  Musculoskeletal: Negative for back pain, falls and myalgias.  Skin: Negative for rash.  Neurological: Negative for dizziness, sensory change, loss of consciousness, weakness and headaches.  Endo/Heme/Allergies: Negative for environmental allergies. Does not bruise/bleed easily.  Psychiatric/Behavioral: Negative for depression and suicidal ideas. The patient is not nervous/anxious and does not have insomnia.        Objective:    Physical Exam Vitals signs and nursing note reviewed.  Constitutional:      General: David Esparza is not in acute distress.    Appearance: David Esparza is well-developed.  HENT:     Head: Normocephalic and atraumatic.     Nose: Nose normal.  Eyes:     General:        Right eye: No discharge.        Left eye: No discharge.  Neck:     Musculoskeletal: Normal range of motion and neck supple.  Cardiovascular:     Rate and Rhythm: Normal rate and regular rhythm.     Heart sounds: No murmur.  Pulmonary:     Effort: Pulmonary effort is normal.     Breath sounds: Normal breath sounds.    Abdominal:     General: Bowel sounds are normal.     Palpations: Abdomen is soft.     Tenderness: There is no abdominal tenderness.  Skin:    General: Skin is warm and dry.  Neurological:     Mental Status: David Esparza is alert  and oriented to person, place, and time.     BP (!) 142/82 (BP Location: Left Arm, Patient Position: Sitting, Cuff Size: Normal)   Pulse 99   Temp 98.6 F (37 C) (Oral)   Resp 18   Wt 270 lb (122.5 kg)   SpO2 96%   BMI 42.29 kg/m  Wt Readings from Last 3 Encounters:  01/08/18 270 lb (122.5 kg)  08/21/17 266 lb 12.8 oz (121 kg)  07/18/17 264 lb 6.4 oz (119.9 kg)     Lab Results  Component Value Date   WBC 6.6 01/08/2018   HGB 16.7 01/08/2018   HCT 47.2 01/08/2018   PLT 92.0 (L) 01/08/2018   GLUCOSE 144 (H) 01/08/2018   CHOL 168 01/08/2018   TRIG 358.0 (H) 01/08/2018   HDL 39.30 01/08/2018   LDLDIRECT 87.0 01/08/2018   LDLCALC 101 (H) 07/18/2017   ALT 60 (H) 01/08/2018   AST 60 (H) 01/08/2018   NA 139 01/08/2018   K 3.9 01/08/2018   CL 104 01/08/2018   CREATININE 1.35 01/08/2018   BUN 23 01/08/2018   CO2 26 01/08/2018   TSH 2.71 01/08/2018   PSA 0.60 01/08/2018   HGBA1C 6.7 (H) 01/08/2018   MICROALBUR 1.55 02/21/2012    Lab Results  Component Value Date   TSH 2.71 01/08/2018   Lab Results  Component Value Date   WBC 6.6 01/08/2018   HGB 16.7 01/08/2018   HCT 47.2 01/08/2018   MCV 96.0 01/08/2018   PLT 92.0 (L) 01/08/2018   Lab Results  Component Value Date   NA 139 01/08/2018   K 3.9 01/08/2018   CO2 26 01/08/2018   GLUCOSE 144 (H) 01/08/2018   BUN 23 01/08/2018   CREATININE 1.35 01/08/2018   BILITOT 0.8 01/08/2018   ALKPHOS 136 (H) 01/08/2018   AST 60 (H) 01/08/2018   ALT 60 (H) 01/08/2018   PROT 7.1 01/08/2018   ALBUMIN 4.2 01/08/2018   CALCIUM 9.5 01/08/2018   GFR 55.76 (L) 01/08/2018   Lab Results  Component Value Date   CHOL 168 01/08/2018   Lab Results  Component Value Date   HDL 39.30 01/08/2018   Lab  Results  Component Value Date   LDLCALC 101 (H) 07/18/2017   Lab Results  Component Value Date   TRIG 358.0 (H) 01/08/2018   Lab Results  Component Value Date   CHOLHDL 4 01/08/2018   Lab Results  Component Value Date   HGBA1C 6.7 (H) 01/08/2018       Assessment & Plan:   Problem List Items Addressed This Visit    Hypertension    Well controlled, no changes to meds. Encouraged heart healthy diet such as the DASH diet and exercise as tolerated.       Relevant Orders   CBC (Completed)   Comprehensive metabolic panel (Completed)   TSH (Completed)   Obese    Encouraged DASH diet, decrease po intake and increase exercise as tolerated. Needs 7-8 hours of sleep nightly. Avoid trans fats, eat small, frequent meals every 4-5 hours with lean proteins, complex carbs and healthy fats. Minimize simple carbs      Hyperglycemia    hgba1c acceptable, minimize simple carbs. Increase exercise as tolerated       Relevant Orders   Hemoglobin A1c (Completed)   CBC (Completed)   Comprehensive metabolic panel (Completed)   TSH (Completed)   Abnormal LFTs    Minimize simple carbs and fatty foods      Hyperlipidemia  Encouraged heart healthy diet, increase exercise, avoid trans fats, consider a krill oil cap daily      Relevant Orders   Lipid panel (Completed)   Low testosterone    Will monitor      Vitamin D deficiency    Supplement and monitor      Relevant Orders   VITAMIN D 25 Hydroxy (Vit-D Deficiency, Fractures) (Completed)   Fatty liver    Minimize simple carbs and fatty foods, stay active and monitor       Other Visit Diagnoses    Morbid obesity ([place])    -  Primary   Relevant Orders   Amb Ref to Medical Weight Management   TSH (Completed)   Testosterone deficiency       Relevant Orders   Testosterone (Completed)   Urinary incontinence, unspecified type       Relevant Orders   PSA (Completed)   Urine Culture (Completed)   Gout, unspecified cause,  unspecified chronicity, unspecified site       Relevant Orders   Uric acid (Completed)   Abnormal alkaline phosphatase test       Relevant Orders   Lactate Dehydrogenase (LDH) (Completed)      I am having David Esparza "David Esparza" maintain his multivitamin, fish oil-omega-3 fatty acids, b complex vitamins, colchicine, atorvastatin, amLODipine, allopurinol, losartan-hydrochlorothiazide, fluocinolone, and sildenafil.  No orders of the defined types were placed in this encounter.    Penni Homans, MD

## 2018-01-11 NOTE — Telephone Encounter (Signed)
Notified pt of below and he is agreeable to proceed with U/S. Advised pt to contact radiology and provided him with # to call if he has not been contacted to schedule the u/s in 3-4 days. Order placed.

## 2018-01-11 NOTE — Telephone Encounter (Signed)
-----  Message from Mosie Lukes, MD sent at 01/11/2018  3:12 PM EST ----- Notify no new concerns. Alk phos improved some but liver markers up a little. Likely related to fatty liver disease which happens with sugar, weight gain and aging. Would like to order an Ultrasound of the abdomen to further investigate if he is willing. Minimize simple carbs and fatty foods for now.

## 2018-01-16 ENCOUNTER — Ambulatory Visit (HOSPITAL_BASED_OUTPATIENT_CLINIC_OR_DEPARTMENT_OTHER)
Admission: RE | Admit: 2018-01-16 | Discharge: 2018-01-16 | Disposition: A | Discharge status: Home / Self Care | Payer: Medicare Other | Source: Ambulatory Visit | Attending: Family Medicine | Admitting: Family Medicine

## 2018-01-16 DIAGNOSIS — R748 Abnormal levels of other serum enzymes: Secondary | ICD-10-CM | POA: Diagnosis not present

## 2018-01-16 DIAGNOSIS — N281 Cyst of kidney, acquired: Secondary | ICD-10-CM | POA: Diagnosis not present

## 2018-03-06 ENCOUNTER — Ambulatory Visit (INDEPENDENT_AMBULATORY_CARE_PROVIDER_SITE_OTHER): Payer: Medicare Other | Admitting: Family Medicine

## 2018-03-06 ENCOUNTER — Encounter: Payer: Self-pay | Admitting: Family Medicine

## 2018-03-06 VITALS — BP 142/84 | HR 98 | Temp 98.4°F | Resp 18 | Wt 267.4 lb

## 2018-03-06 DIAGNOSIS — R351 Nocturia: Secondary | ICD-10-CM

## 2018-03-06 DIAGNOSIS — E559 Vitamin D deficiency, unspecified: Secondary | ICD-10-CM

## 2018-03-06 DIAGNOSIS — Q254 Congenital malformation of aorta unspecified: Secondary | ICD-10-CM

## 2018-03-06 DIAGNOSIS — I714 Abdominal aortic aneurysm, without rupture, unspecified: Secondary | ICD-10-CM

## 2018-03-06 DIAGNOSIS — G4730 Sleep apnea, unspecified: Secondary | ICD-10-CM | POA: Diagnosis not present

## 2018-03-06 DIAGNOSIS — R739 Hyperglycemia, unspecified: Secondary | ICD-10-CM | POA: Diagnosis not present

## 2018-03-06 DIAGNOSIS — E785 Hyperlipidemia, unspecified: Secondary | ICD-10-CM | POA: Diagnosis not present

## 2018-03-06 DIAGNOSIS — E1169 Type 2 diabetes mellitus with other specified complication: Secondary | ICD-10-CM | POA: Diagnosis not present

## 2018-03-06 DIAGNOSIS — I10 Essential (primary) hypertension: Secondary | ICD-10-CM

## 2018-03-06 DIAGNOSIS — E669 Obesity, unspecified: Secondary | ICD-10-CM | POA: Diagnosis not present

## 2018-03-06 DIAGNOSIS — L578 Other skin changes due to chronic exposure to nonionizing radiation: Secondary | ICD-10-CM

## 2018-03-06 DIAGNOSIS — R0602 Shortness of breath: Secondary | ICD-10-CM | POA: Diagnosis not present

## 2018-03-06 MED ORDER — MUPIROCIN 2 % EX OINT: 1.0000 "application " | TOPICAL_OINTMENT | Freq: Every evening | CUTANEOUS | 0 refills | Status: AC | PRN

## 2018-03-06 NOTE — Patient Instructions (Signed)
Vitamin D 3, 2000 IU daily  MIND or DASH diet, pescaterian diet Abdominal Aortic Aneurysm  An aneurysm is a bulge in an artery. It happens when blood pushes against a weakened or damaged artery wall. An abdominal aortic aneurysm (AAA) is an aneurysm that occurs in the lower part of the aorta, which is the main artery of the body. The aorta supplies blood from the heart to the rest of the body. Some aneurysms may not cause symptoms. However, an AAA can cause two serious problems:  It can enlarge and burst (rupture).  It can cause blood to flow between the layers of the wall of the aorta through a tear (aortic dissection). Both of these problems are medical emergencies. They can cause bleeding inside the body. If they are not diagnosed and treated right away, they can be life-threatening. What are the causes? The exact cause of this condition is not known. What increases the risk? The following factors may make you more likely to develop this condition:  Being a male 31 years of age or older.  Being Caucasian.  Using tobacco or having a history of tobacco use.  Having a family history of aneurysms.  Having any of the following conditions: ? Hardening of the arteries (arteriosclerosis). ? Inflammation of the walls of an artery (arteritis). ? Certain genetic conditions. ? Obesity. ? An infection in the wall of the aorta (infectious aortitis) caused by bacteria. ? High cholesterol. ? High blood pressure (hypertension). What are the signs or symptoms? Symptoms of this condition vary depending on the size of the aneurysm and how fast it is growing. Most aneurysms grow slowly and do not cause any symptoms. When symptoms do occur, they may include:  Pain in the abdomen, side, or lower back.  Feeling full after eating only small amounts of food.  Feeling a pulsating lump in the abdomen. Symptoms that the aneurysm has ruptured include:  A sudden onset of severe pain in the abdomen,  side, or back.  Nausea or vomiting.  Feeling light-headed or passing out. How is this diagnosed? This condition may be diagnosed with:  A physical exam to check for throbbing and to listen to blood flow in your abdomen.  Tests, such as: ? Ultrasound. ? X-rays. ? CT scan. ? MRI. ? Tests to check your arteries for damage or blockage (angiogram). Because most unruptured AAAs cause no symptoms, they are often found during exams for other conditions. How is this treated? Treatment for this condition depends on:  The size of the aneurysm.  How fast the aneurysm is growing.  Your age.  Risk factors for rupture. If your aneurysm is smaller than 2 inches (5 cm), your health care provider may manage it by:  Checking (monitoring) it regularly to see if it is getting bigger. Depending on the size of the aneurysm, how fast it is growing, and your other risk factors, you may have an ultrasound to monitor it every 3-6 months, every year, or every few years.  Giving you medicines to control blood pressure, treat pain, or fight infection. If your aneurysm is larger than 2 inches (5 cm), your health care provider may do surgery to repair it. Follow these instructions at home: Lifestyle  Do not use any products that contain nicotine or tobacco, such as cigarettes, e-cigarettes, and chewing tobacco. If you need help quitting, ask your health care provider.  Stay physically active and exercise regularly. Talk with your health care provider about how often you should exercise  and which types of exercise are safe for you. Eating and drinking  Eat a heart-healthy diet. This includes plenty of fresh fruits and vegetables, whole grains, low-fat (lean) protein, and low-fat dairy products.  Avoid foods that are high in saturated fat and cholesterol, such as red meat and some dairy products.  Do not drink alcohol if: ? Your health care provider tells you not to drink. ? You are pregnant, may be  pregnant, or are planning to become pregnant.  If you drink alcohol: ? Limit how much you use to:  0-1 drink a day for women.  0-2 drinks a day for men. ? Be aware of how much alcohol is in your drink. In the [place] one drink equals one typical bottle of beer (12 oz), one-half glass of wine (5 oz), or one shot of hard liquor (1 oz). General instructions  Take over-the-counter and prescription medicines only as told by your health care provider.  Keep your blood pressure within a normal range. Check it regularly, and ask your health care provider what your target blood pressure should be.  Have your blood sugar (glucose) level and cholesterol levels checked regularly. Follow your health care provider's instructions on how to keep levels within normal limits.  Avoid heavy lifting and activities that take a lot of effort (are strenuous). Ask your health care provider what activities are safe for you.  Keep all follow-up visits as told by your health care provider. This is important. Contact a health care provider if you:  Have pain in your abdomen, side, or back.  Have a throbbing feeling in your abdomen. Get help right away if you:  Have sudden, severe pain in your abdomen, side, or back.  Experience nausea or vomiting.  Have constipation or problems urinating.  Feel light-headed.  Have a rapid heart rate when you stand.  Have sweaty, clammy skin.  Have shortness of breath.  Have a fever. These symptoms may represent a serious problem that is an emergency. Do not wait to see if the symptoms will go away. Get medical help right away. Call your local emergency services (911 in the [place] Do not drive yourself to the hospital. Summary  An aneurysm is a bulge in an artery. It happens when blood pushes against a weakened or damaged artery wall.  Being older, male, Caucasian, having a history of tobacco use, and a family history of aneurysms can increase the risk.  These  problems can cause bleeding inside the body and can be life-threatening. Get medical help right away. This information is not intended to replace advice given to you by your health care provider. Make sure you discuss any questions you have with your health care provider. Document Released: 10/06/2004 Document Revised: 08/05/2017 Document Reviewed: 08/05/2017 Elsevier Interactive Patient Education  2019 [employer].

## 2018-03-07 DIAGNOSIS — E785 Hyperlipidemia, unspecified: Secondary | ICD-10-CM

## 2018-03-07 DIAGNOSIS — Q254 Congenital malformation of aorta unspecified: Secondary | ICD-10-CM | POA: Insufficient documentation

## 2018-03-07 DIAGNOSIS — I714 Abdominal aortic aneurysm, without rupture, unspecified: Secondary | ICD-10-CM | POA: Insufficient documentation

## 2018-03-07 DIAGNOSIS — L578 Other skin changes due to chronic exposure to nonionizing radiation: Secondary | ICD-10-CM | POA: Insufficient documentation

## 2018-03-07 DIAGNOSIS — E1169 Type 2 diabetes mellitus with other specified complication: Secondary | ICD-10-CM | POA: Insufficient documentation

## 2018-03-07 LAB — COMPREHENSIVE METABOLIC PANEL
ALBUMIN: 4.3 g/dL (ref 3.5–5.2)
ALT: 60 U/L — ABNORMAL HIGH (ref 0–53)
AST: 61 U/L — ABNORMAL HIGH (ref 0–37)
Alkaline Phosphatase: 136 U/L — ABNORMAL HIGH (ref 39–117)
BUN: 23 mg/dL (ref 6–23)
CO2: 25 mEq/L (ref 19–32)
Calcium: 10 mg/dL (ref 8.4–10.5)
Chloride: 102 mEq/L (ref 96–112)
Creatinine, Ser: 0.98 mg/dL (ref 0.40–1.50)
GFR: 75.89 mL/min (ref >60.00)
Glucose, Bld: 131 mg/dL — ABNORMAL HIGH (ref 70–99)
Potassium: 4.1 mEq/L (ref 3.5–5.1)
Sodium: 139 mEq/L (ref 135–145)
Total Bilirubin: 0.7 mg/dL (ref 0.2–1.2)
Total Protein: 7.2 g/dL (ref 6.0–8.3)

## 2018-03-07 NOTE — Assessment & Plan Note (Signed)
Noted on recent echo and some slight widening of the abdominal aorta noted also. Referred to cardiology/vascular for further evaluation and treatment.

## 2018-03-07 NOTE — Assessment & Plan Note (Signed)
Supplement and monitor 

## 2018-03-07 NOTE — Assessment & Plan Note (Signed)
Encouraged DASH diet, decrease po intake and increase exercise as tolerated. Needs 7-8 hours of sleep nightly. Avoid trans fats, eat small, frequent meals every 4-5 hours with lean proteins, complex carbs and healthy fats. Minimize simple carbs referred to healthy weight and wellness

## 2018-03-07 NOTE — Progress Notes (Signed)
Subjective:    Patient ID: PHINNEAS SHAKOOR, male    DOB: 1950-09-17, 68 y.o.   MRN: [id number]  No chief complaint on file.   HPI Patient is in today for follow up. He feels well today but continues to endorse fatigue and shortness of breath on exertion. Stable but unrelenting. No recent febrile illness or hospitalizations. No polyuria or polydipsia. He has exercising at the gym a couple of times a week. Denies CP/palp/HA/congestion/fevers/GI or GU c/o. Taking meds as prescribed  Past Medical History:  Diagnosis Date  . Abnormal LFTs 09/06/2011  . Acute bronchitis 08/16/2010  . Anxiety 09/06/2011  . Chicken pox as a child  . Cutaneous skin tags 07/27/2016  . Fatigue   . Gout   . Gout   . Grief reaction 09/09/2012  . Hyperglycemia 09/06/2011  . Hyperlipidemia 09/06/2011  . Hypertension   . Insomnia 08/16/2010  . Knee pain, acute 02/28/2012  . Low testosterone 02/28/2012  . Measles as a child  . Mumps as a child  . Nocturia 08/16/2010  . Obese   . Preventative health care 09/06/2011  . Sleep apnea 09/06/2011  . Unspecified vitamin D deficiency 02/28/2012    Past Surgical History:  Procedure Laterality Date  . BACK SURGERY    . COLONOSCOPY  09/16/10   Dr. Deatra Ina: moderate diverticulosis sigmoid and descending colon, o/w normal: repeat 10 yrs.  . intestines sewn  68 yrs old   shot once made 6 holes  . TONSILLECTOMY  as a child  . VASECTOMY  1 yrs old    Family History  Problem Relation Age of Onset  . Diabetes Mother        type 2  . Cancer Maternal Grandmother        throat  . Heart disease Maternal Grandfather   . Diabetes Maternal Grandfather   . Depression Daughter        anxiety  . Graves' disease Daughter   . Colon polyps Neg Hx     Social History   Socioeconomic History  . Marital status: Married    Spouse name: Not on file  . Number of children: Not on file  . Years of education: Not on file  . Highest education level: Not on file  Occupational History  . Not  on file  Social Needs  . Financial resource strain: Not on file  . Food insecurity:    Worry: Not on file    Inability: Not on file  . Transportation needs:    Medical: Not on file    Non-medical: Not on file  Tobacco Use  . Smoking status: Former Smoker    Types: Cigarettes    Last attempt to quit: 08/30/1968    Years since quitting: 49.5  . Smokeless tobacco: Never Used  . Tobacco comment: smoked in high school  Substance and Sexual Activity  . Alcohol use: No    Comment: very little  . Drug use: No  . Sexual activity: Yes    Partners: Female  Lifestyle  . Physical activity:    Days per week: Not on file    Minutes per session: Not on file  . Stress: Not on file  Relationships  . Social connections:    Talks on phone: Not on file    Gets together: Not on file    Attends religious service: Not on file    Active member of club or organization: Not on file    Attends meetings of clubs  or organizations: Not on file    Relationship status: Not on file  . Intimate partner violence:    Fear of current or ex partner: Not on file    Emotionally abused: Not on file    Physically abused: Not on file    Forced sexual activity: Not on file  Other Topics Concern  . Not on file  Social History Narrative  . Not on file    Outpatient Medications Prior to Visit  Medication Sig Dispense Refill  . allopurinol (ZYLOPRIM) 300 MG tablet Take 1 tablet (300 mg total) by mouth daily. 90 tablet 1  . amLODipine (NORVASC) 5 MG tablet TAKE 1 TABLET BY MOUTH DAILY. 90 tablet 1  . atorvastatin (LIPITOR) 10 MG tablet Take 1 tablet (10 mg total) by mouth at bedtime. 30 tablet 5  . b complex vitamins capsule Take 1 capsule by mouth every other day.    . colchicine 0.6 MG tablet Take 1 tablet (0.6 mg total) by mouth daily. Gout Flare-up 30 tablet 3  . fish oil-omega-3 fatty acids 1000 MG capsule Take 2 g by mouth daily as needed.     . fluocinolone (VANOS) 0.01 % cream Apply topically 2 (two)  times daily. 30 g 1  . losartan-hydrochlorothiazide (HYZAAR) 100-25 MG tablet Take 1 tablet by mouth daily. 90 tablet 1  . multivitamin (THERAGRAN) tablet Take 1 tablet by mouth daily.      . sildenafil (VIAGRA) 100 MG tablet Take 0.5-1 tablets (50-100 mg total) by mouth daily as needed for erectile dysfunction. 30 tablet 2   No facility-administered medications prior to visit.     No Known Allergies  Review of Systems  Constitutional: Negative for fever and malaise/fatigue.  HENT: Negative for congestion.   Eyes: Negative for blurred vision.  Respiratory: Positive for shortness of breath.   Cardiovascular: Negative for chest pain, palpitations and leg swelling.  Gastrointestinal: Negative for abdominal pain, blood in stool and nausea.  Genitourinary: Negative for dysuria and frequency.  Musculoskeletal: Negative for falls.  Skin: Negative for rash.  Neurological: Negative for dizziness, loss of consciousness and headaches.  Endo/Heme/Allergies: Negative for environmental allergies.  Psychiatric/Behavioral: Negative for depression. The patient is not nervous/anxious.        Objective:    Physical Exam Vitals signs and nursing note reviewed.  Constitutional:      General: He is not in acute distress.    Appearance: He is well-developed.  HENT:     Head: Normocephalic and atraumatic.     Nose: Nose normal.  Eyes:     General:        Right eye: No discharge.        Left eye: No discharge.  Neck:     Musculoskeletal: Normal range of motion and neck supple.  Cardiovascular:     Rate and Rhythm: Normal rate and regular rhythm.     Heart sounds: No murmur.  Pulmonary:     Effort: Pulmonary effort is normal.     Breath sounds: Normal breath sounds.  Abdominal:     General: Bowel sounds are normal.     Palpations: Abdomen is soft.     Tenderness: There is no abdominal tenderness.  Skin:    General: Skin is warm and dry.  Neurological:     Mental Status: He is alert and  oriented to person, place, and time.     BP (!) 142/84 (BP Location: Left Arm, Patient Position: Sitting, Cuff Size: Normal)   Pulse  98   Temp 98.4 F (36.9 C) (Oral)   Resp 18   Wt 267 lb 6.4 oz (121.3 kg)   SpO2 95%   BMI 41.88 kg/m  Wt Readings from Last 3 Encounters:  03/06/18 267 lb 6.4 oz (121.3 kg)  01/08/18 270 lb (122.5 kg)  08/21/17 266 lb 12.8 oz (121 kg)     Lab Results  Component Value Date   WBC 6.6 01/08/2018   HGB 16.7 01/08/2018   HCT 47.2 01/08/2018   PLT 92.0 (L) 01/08/2018   GLUCOSE 144 (H) 01/08/2018   CHOL 168 01/08/2018   TRIG 358.0 (H) 01/08/2018   HDL 39.30 01/08/2018   LDLDIRECT 87.0 01/08/2018   LDLCALC 101 (H) 07/18/2017   ALT 60 (H) 01/08/2018   AST 60 (H) 01/08/2018   NA 139 01/08/2018   K 3.9 01/08/2018   CL 104 01/08/2018   CREATININE 1.35 01/08/2018   BUN 23 01/08/2018   CO2 26 01/08/2018   TSH 2.71 01/08/2018   PSA 0.60 01/08/2018   HGBA1C 6.7 (H) 01/08/2018   MICROALBUR 1.55 02/21/2012    Lab Results  Component Value Date   TSH 2.71 01/08/2018   Lab Results  Component Value Date   WBC 6.6 01/08/2018   HGB 16.7 01/08/2018   HCT 47.2 01/08/2018   MCV 96.0 01/08/2018   PLT 92.0 (L) 01/08/2018   Lab Results  Component Value Date   NA 139 01/08/2018   K 3.9 01/08/2018   CO2 26 01/08/2018   GLUCOSE 144 (H) 01/08/2018   BUN 23 01/08/2018   CREATININE 1.35 01/08/2018   BILITOT 0.8 01/08/2018   ALKPHOS 136 (H) 01/08/2018   AST 60 (H) 01/08/2018   ALT 60 (H) 01/08/2018   PROT 7.1 01/08/2018   ALBUMIN 4.2 01/08/2018   CALCIUM 9.5 01/08/2018   GFR 55.76 (L) 01/08/2018   Lab Results  Component Value Date   CHOL 168 01/08/2018   Lab Results  Component Value Date   HDL 39.30 01/08/2018   Lab Results  Component Value Date   LDLCALC 101 (H) 07/18/2017   Lab Results  Component Value Date   TRIG 358.0 (H) 01/08/2018   Lab Results  Component Value Date   CHOLHDL 4 01/08/2018   Lab Results  Component Value  Date   HGBA1C 6.7 (H) 01/08/2018       Assessment & Plan:   Problem List Items Addressed This Visit    Hypertension    Well controlled, no changes to meds. Encouraged heart healthy diet such as the DASH diet and exercise as tolerated.       Relevant Orders   Ambulatory referral to Cardiology   Comprehensive metabolic panel   CBC with Differential/Platelet   Comprehensive metabolic panel   TSH   Obese    Encouraged DASH diet, decrease po intake and increase exercise as tolerated. Needs 7-8 hours of sleep nightly. Avoid trans fats, eat small, frequent meals every 4-5 hours with lean proteins, complex carbs and healthy fats. Minimize simple carbs referred to healthy weight and wellness      Nocturia   Sleep apnea    Not using his CPAP is referred to pulmonology for further management and to get restarted      Relevant Orders   Ambulatory referral to Cardiology   Hyperglycemia    hgba1c acceptable, minimize simple carbs. Increase exercise as tolerated.       Relevant Orders   Ambulatory referral to Cardiology   Hemoglobin A1c  Vitamin D deficiency    Supplement andmonitor      Relevant Orders   VITAMIN D 25 Hydroxy (Vit-D Deficiency, Fractures)   SOBOE (shortness of breath on exertion)    Referred to pulmonology and cardiology for further evaluation. Echo showed numerous but minor abnormalities.       Relevant Orders   Ambulatory referral to Cardiology   Ambulatory referral to Pulmonology   Sun-damaged skin   Relevant Orders   Ambulatory referral to Dermatology   Hyperlipidemia associated with type 2 diabetes mellitus ([place])    Tolerating statin, encouraged heart healthy diet, avoid trans fats, minimize simple carbs and saturated fats. Increase exercise as tolerated      Relevant Orders   Lipid panel   Abnormality of thoracic aorta - Primary    Noted on recent echo and some slight widening of the abdominal aorta noted also. Referred to cardiology/vascular for  further evaluation and treatment.       Relevant Orders   Ambulatory referral to Cardiology   RESOLVED: Abdominal aortic aneurysm (AAA) without rupture [facility])   Relevant Orders   Ambulatory referral to Cardiology      I am having Devron L. Fallin "Sam" start on mupirocin ointment. I am also having him maintain his multivitamin, fish oil-omega-3 fatty acids, b complex vitamins, colchicine, atorvastatin, amLODipine, allopurinol, losartan-hydrochlorothiazide, fluocinolone, and sildenafil.  Meds ordered this encounter  Medications  . mupirocin ointment (BACTROBAN) 2 %    Sig: Place 1 application into the nose at bedtime as needed for up to 7 days.    Dispense:  22 g    Refill:  0     Penni Homans, MD

## 2018-03-07 NOTE — Assessment & Plan Note (Signed)
hgba1c acceptable, minimize simple carbs. Increase exercise as tolerated.  

## 2018-03-07 NOTE — Assessment & Plan Note (Signed)
Well controlled, no changes to meds. Encouraged heart healthy diet such as the DASH diet and exercise as tolerated.  °

## 2018-03-07 NOTE — Assessment & Plan Note (Signed)
Tolerating statin, encouraged heart healthy diet, avoid trans fats, minimize simple carbs and saturated fats. Increase exercise as tolerated 

## 2018-03-07 NOTE — Assessment & Plan Note (Signed)
Not using his CPAP is referred to pulmonology for further management and to get restarted

## 2018-03-07 NOTE — Assessment & Plan Note (Signed)
Referred to pulmonology and cardiology for further evaluation. Echo showed numerous but minor abnormalities.

## 2018-05-19 ENCOUNTER — Other Ambulatory Visit: Payer: Self-pay | Admitting: Family Medicine

## 2018-05-19 DIAGNOSIS — R739 Hyperglycemia, unspecified: Secondary | ICD-10-CM

## 2018-05-19 DIAGNOSIS — F419 Anxiety disorder, unspecified: Secondary | ICD-10-CM

## 2018-05-21 ENCOUNTER — Other Ambulatory Visit: Payer: Self-pay | Admitting: Family Medicine

## 2018-05-21 DIAGNOSIS — R739 Hyperglycemia, unspecified: Secondary | ICD-10-CM

## 2018-05-21 DIAGNOSIS — F419 Anxiety disorder, unspecified: Secondary | ICD-10-CM

## 2018-05-21 MED ORDER — ALPRAZOLAM 1 MG PO TABS: ORAL_TABLET | ORAL | 1 refills | Status: DC

## 2018-05-21 NOTE — Telephone Encounter (Signed)
Requesting:xanax Contract:yes UDS:yes Last OV:03/06/18  Next OV: n/a Last Refill: 03/06/18  #[id number] Database:   Please advise

## 2018-07-19 NOTE — Progress Notes (Signed)
Virtual Visit via Video Note  I connected with patient on 07/20/18 at  9:00 AM EDT by a video enabled telemedicine application and verified that I am speaking with the correct person using two identifiers.   THIS ENCOUNTER IS A VIRTUAL VISIT DUE TO COVID-19 - PATIENT WAS NOT SEEN IN THE OFFICE. PATIENT HAS CONSENTED TO VIRTUAL VISIT / TELEMEDICINE VISIT   Location of patient: home  Location of provider: office  I discussed the limitations of evaluation and management by telemedicine and the availability of in person appointments. The patient expressed understanding and agreed to proceed.   Subjective:   David Esparza is a 68 y.o. male who presents for Medicare Annual/Subsequent preventive examination.  Pt still working from home 60 hrs per week.  Review of Systems: No ROS.  Medicare Wellness Virtual Visit.  Visual/audio telehealth visit, UTA vital signs.   See social history for additional risk factors. Cardiac Risk Factors include: advanced age (>85men, >47 women);diabetes mellitus;dyslipidemia;hypertension;male gender;obesity (BMI >30kg/m2);sedentary lifestyle Sleep patterns: Sleeps well. Takes Xanax only as need. Home Safety/Smoke Alarms: Feels safe in home. Smoke alarms in place.  Lives with wife in 1 story.  Male:   CCS-  Due 09/2020 PSA-  Lab Results  Component Value Date   PSA 0.60 01/08/2018   PSA 0.58 07/18/2017   PSA 1.31 07/05/2016       Objective:    Advanced Directives 07/20/2018 07/18/2017 07/04/2016  Does Patient Have a Medical Advance Directive? No No No  Does patient want to make changes to medical advance directive? Yes (MAU/Ambulatory/Procedural Areas - Information given) - -  Would patient like information on creating a medical advance directive? - Yes (MAU/Ambulatory/Procedural Areas - Information given) Yes (MAU/Ambulatory/Procedural Areas - Information given)    Tobacco Social History   Tobacco Use  Smoking Status Former Smoker  . Types:  Cigarettes  . Quit date: 08/30/1968  . Years since quitting: 49.9  Smokeless Tobacco Never Used  Tobacco Comment   smoked in high school     Counseling given: Not Answered Comment: smoked in high school   Clinical Intake: Pain : No/denies pain    Past Medical History:  Diagnosis Date  . Abnormal LFTs 09/06/2011  . Acute bronchitis 08/16/2010  . Anxiety 09/06/2011  . Chicken pox as a child  . Cutaneous skin tags 07/27/2016  . Fatigue   . Gout   . Gout   . Grief reaction 09/09/2012  . Hyperglycemia 09/06/2011  . Hyperlipidemia 09/06/2011  . Hypertension   . Insomnia 08/16/2010  . Knee pain, acute 02/28/2012  . Low testosterone 02/28/2012  . Measles as a child  . Mumps as a child  . Nocturia 08/16/2010  . Obese   . Preventative health care 09/06/2011  . Sleep apnea 09/06/2011  . Unspecified vitamin D deficiency 02/28/2012   Past Surgical History:  Procedure Laterality Date  . BACK SURGERY    . COLONOSCOPY  09/16/10   Dr. Deatra Ina: moderate diverticulosis sigmoid and descending colon, o/w normal: repeat 10 yrs.  . intestines sewn  68 yrs old   shot once made 6 holes  . TONSILLECTOMY  as a child  . VASECTOMY  23 yrs old   Family History  Problem Relation Age of Onset  . Diabetes Mother        type 2  . Cancer Maternal Grandmother        throat  . Heart disease Maternal Grandfather   . Diabetes Maternal Grandfather   . Depression Daughter  anxiety  . Graves' disease Daughter   . Colon polyps Neg Hx    Social History   Socioeconomic History  . Marital status: Married    Spouse name: Not on file  . Number of children: Not on file  . Years of education: Not on file  . Highest education level: Not on file  Occupational History  . Not on file  Social Needs  . Financial resource strain: Not on file  . Food insecurity    Worry: Not on file    Inability: Not on file  . Transportation needs    Medical: Not on file    Non-medical: Not on file  Tobacco Use  . Smoking  status: Former Smoker    Types: Cigarettes    Quit date: 08/30/1968    Years since quitting: 49.9  . Smokeless tobacco: Never Used  . Tobacco comment: smoked in high school  Substance and Sexual Activity  . Alcohol use: No    Comment: very little  . Drug use: No  . Sexual activity: Yes    Partners: Female  Lifestyle  . Physical activity    Days per week: Not on file    Minutes per session: Not on file  . Stress: Not on file  Relationships  . Social Herbalist on phone: Not on file    Gets together: Not on file    Attends religious service: Not on file    Active member of club or organization: Not on file    Attends meetings of clubs or organizations: Not on file    Relationship status: Not on file  Other Topics Concern  . Not on file  Social History Narrative  . Not on file    Outpatient Encounter Medications as of 07/20/2018  Medication Sig  . allopurinol (ZYLOPRIM) 300 MG tablet Take 1 tablet (300 mg total) by mouth daily.  Marland Kitchen ALPRAZolam (XANAX) 1 MG tablet TAKE 1 TABLET BY MOUTH 2 TIMES DAILY AS NEEDED FOR SLEEP.  Marland Kitchen amLODipine (NORVASC) 5 MG tablet TAKE 1 TABLET BY MOUTH DAILY.  Marland Kitchen atorvastatin (LIPITOR) 10 MG tablet Take 1 tablet (10 mg total) by mouth at bedtime.  Marland Kitchen b complex vitamins capsule Take 1 capsule by mouth every other day.  . fish oil-omega-3 fatty acids 1000 MG capsule Take 2 g by mouth daily as needed.   . fluocinolone (VANOS) 0.01 % cream Apply topically 2 (two) times daily.  Marland Kitchen losartan-hydrochlorothiazide (HYZAAR) 100-25 MG tablet Take 1 tablet by mouth daily.  . multivitamin (THERAGRAN) tablet Take 1 tablet by mouth daily.    . sildenafil (VIAGRA) 100 MG tablet Take 0.5-1 tablets (50-100 mg total) by mouth daily as needed for erectile dysfunction.  Marland Kitchen COLCRYS 0.6 MG tablet TAKE 1 TABLET BY MOUTH DAILY FOR GOUT FLARE-UP (Patient not taking: Reported on 07/20/2018)   No facility-administered encounter medications on file as of 07/20/2018.      Activities of Daily Living In your present state of health, do you have any difficulty performing the following activities: 07/20/2018  Hearing? N  Vision? N  Difficulty concentrating or making decisions? N  Walking or climbing stairs? N  Dressing or bathing? N  Doing errands, shopping? N  Preparing Food and eating ? N  Using the Toilet? N  In the past six months, have you accidently leaked urine? N  Do you have problems with loss of bowel control? N  Managing your Medications? N  Managing your Finances? N  Housekeeping or managing your Housekeeping? N  Some recent data might be hidden    Patient Care Team: Mosie Lukes, MD as PCP - General (Family Medicine)   Assessment:   This is a routine wellness examination for Shalev. Physical assessment deferred to PCP.  Exercise Activities and Dietary recommendations Current Exercise Habits: The patient does not participate in regular exercise at present, Exercise limited by: None identified Diet (meal preparation, eat out, water intake, caffeinated beverages, dairy products, fruits and vegetables): in general, a "healthy" diet  , well balanced, on average, 3 meals per day      Goals    . Increase physical activity    . Weight (lb) < 240 lb (108.9 kg) (pt-stated)       Fall Risk Fall Risk  07/18/2017 08/01/2016 07/04/2016 11/18/2014  Falls in the past year? No No No No  Comment - Emmi Telephone Survey: data to providers prior to load - -    Depression Screen PHQ 2/9 Scores 07/18/2017 07/04/2016  PHQ - 2 Score 0 0    Cognitive Function Ad8 score reviewed for issues:  Issues making decisions: no  Less interest in hobbies / activities:no  Repeats questions, stories (family complaining):no  Trouble using ordinary gadgets (microwave, computer, phone):no  Forgets the month or year: no  Mismanaging finances: no  Remembering appts:no  Daily problems with thinking and/or memory:no Ad8 score is=0         There is no  immunization history on file for this patient.    Screening Tests Health Maintenance  Topic Date Due  . FOOT EXAM  06/07/1959  . TETANUS/TDAP  06/06/1968  . OPHTHALMOLOGY EXAM  09/11/2014  . PNA vac Low Risk Adult (2 of 2 - PPSV23) 11/18/2015  . HEMOGLOBIN A1C  07/10/2018  . INFLUENZA VACCINE  08/11/2018  . COLONOSCOPY  09/15/2020  . Hepatitis C Screening  Completed       Plan:    Please schedule your next medicare wellness visit with me in 1 yr.  Continue to eat heart healthy diet (full of fruits, vegetables, whole grains, lean protein, water--limit salt, fat, and sugar intake) and increase physical activity as tolerated.  I have emailed you Advance Directive forms. Bring a copy of your living will and/or healthcare power of attorney to your next office visit.  Continue doing brain stimulating activities (puzzles, reading, adult coloring books, staying active) to keep memory sharp.   I have personally reviewed and noted the following in the patient's chart:   . Medical and social history . Use of alcohol, tobacco or illicit drugs  . Current medications and supplements . Functional ability and status . Nutritional status . Physical activity . Advanced directives . List of other physicians . Hospitalizations, surgeries, and ER visits in previous 12 months . Vitals . Screenings to include cognitive, depression, and falls . Referrals and appointments  In addition, I have reviewed and discussed with patient certain preventive protocols, quality metrics, and best practice recommendations. A written personalized care plan for preventive services as well as general preventive health recommendations were provided to patient.     Shela Nevin, [place]  07/20/2018

## 2018-07-20 ENCOUNTER — Other Ambulatory Visit: Payer: Self-pay

## 2018-07-20 ENCOUNTER — Encounter: Payer: Self-pay | Admitting: *Deleted

## 2018-07-20 ENCOUNTER — Ambulatory Visit (INDEPENDENT_AMBULATORY_CARE_PROVIDER_SITE_OTHER): Payer: Medicare Other | Admitting: *Deleted

## 2018-07-20 DIAGNOSIS — Z0000 Encounter for general adult medical examination without abnormal findings: Secondary | ICD-10-CM | POA: Diagnosis not present

## 2018-07-20 MED ORDER — ALLOPURINOL 300 MG PO TABS: 300.0000 mg | ORAL_TABLET | Freq: Every day | ORAL | 1 refills | Status: DC

## 2018-07-20 NOTE — Patient Instructions (Signed)
Please schedule your next medicare wellness visit with me in 1 yr.  Continue to eat heart healthy diet (full of fruits, vegetables, whole grains, lean protein, water--limit salt, fat, and sugar intake) and increase physical activity as tolerated.  I have emailed you Advance Directive forms. Bring a copy of your living will and/or healthcare power of attorney to your next office visit.  Continue doing brain stimulating activities (puzzles, reading, adult coloring books, staying active) to keep memory sharp.    David Esparza , Thank you for taking time to come for your Medicare Wellness Visit. I appreciate your ongoing commitment to your health goals. Please review the following plan we discussed and let me know if I can assist you in the future.   These are the goals we discussed: Goals    . Increase physical activity    . Weight (lb) < 240 lb (108.9 kg) (pt-stated)       This is a list of the screening recommended for you and due dates:  Health Maintenance  Topic Date Due  . Complete foot exam   06/07/1959  . Tetanus Vaccine  06/06/1968  . Eye exam for diabetics  09/11/2014  . Pneumonia vaccines (2 of 2 - PPSV23) 11/18/2015  . Hemoglobin A1C  07/10/2018  . Flu Shot  08/11/2018  . Colon Cancer Screening  09/15/2020  .  Hepatitis C: One time screening is recommended by Center for Disease Control  (CDC) for  adults born from [birth year] through 1966.   Completed    Health Maintenance After Age 42 After age 40, you are at a higher risk for certain long-term diseases and infections as well as injuries from falls. Falls are a major cause of broken bones and head injuries in people who are older than age 80. Getting regular preventive care can help to keep you healthy and well. Preventive care includes getting regular testing and making lifestyle changes as recommended by your health care provider. Talk with your health care provider about:  Which screenings and tests you should have. A screening is  a test that checks for a disease when you have no symptoms.  A diet and exercise plan that is right for you. What should I know about screenings and tests to prevent falls? Screening and testing are the best ways to find a health problem early. Early diagnosis and treatment give you the best chance of managing medical conditions that are common after age 13. Certain conditions and lifestyle choices may make you more likely to have a fall. Your health care provider may recommend:  Regular vision checks. Poor vision and conditions such as cataracts can make you more likely to have a fall. If you wear glasses, make sure to get your prescription updated if your vision changes.  Medicine review. Work with your health care provider to regularly review all of the medicines you are taking, including over-the-counter medicines. Ask your health care provider about any side effects that may make you more likely to have a fall. Tell your health care provider if any medicines that you take make you feel dizzy or sleepy.  Osteoporosis screening. Osteoporosis is a condition that causes the bones to get weaker. This can make the bones weak and cause them to break more easily.  Blood pressure screening. Blood pressure changes and medicines to control blood pressure can make you feel dizzy.  Strength and balance checks. Your health care provider may recommend certain tests to check your strength and balance while  standing, walking, or changing positions.  Foot health exam. Foot pain and numbness, as well as not wearing proper footwear, can make you more likely to have a fall.  Depression screening. You may be more likely to have a fall if you have a fear of falling, feel emotionally low, or feel unable to do activities that you used to do.  Alcohol use screening. Using too much alcohol can affect your balance and may make you more likely to have a fall. What actions can I take to lower my risk of falls? General  instructions  Talk with your health care provider about your risks for falling. Tell your health care provider if: ? You fall. Be sure to tell your health care provider about all falls, even ones that seem minor. ? You feel dizzy, sleepy, or off-balance.  Take over-the-counter and prescription medicines only as told by your health care provider. These include any supplements.  Eat a healthy diet and maintain a healthy weight. A healthy diet includes low-fat dairy products, low-fat (lean) meats, and fiber from whole grains, beans, and lots of fruits and vegetables. Home safety  Remove any tripping hazards, such as rugs, cords, and clutter.  Install safety equipment such as grab bars in bathrooms and safety rails on stairs.  Keep rooms and walkways well-lit. Activity   Follow a regular exercise program to stay fit. This will help you maintain your balance. Ask your health care provider what types of exercise are appropriate for you.  If you need a cane or walker, use it as recommended by your health care provider.  Wear supportive shoes that have nonskid soles. Lifestyle  Do not drink alcohol if your health care provider tells you not to drink.  If you drink alcohol, limit how much you have: ? 0-1 drink a day for women. ? 0-2 drinks a day for men.  Be aware of how much alcohol is in your drink. In the [place] one drink equals one typical bottle of beer (12 oz), one-half glass of wine (5 oz), or one shot of hard liquor (1 oz).  Do not use any products that contain nicotine or tobacco, such as cigarettes and e-cigarettes. If you need help quitting, ask your health care provider. Summary  Having a healthy lifestyle and getting preventive care can help to protect your health and wellness after age 65.  Screening and testing are the best way to find a health problem early and help you avoid having a fall. Early diagnosis and treatment give you the best chance for managing medical  conditions that are more common for people who are older than age 20.  Falls are a major cause of broken bones and head injuries in people who are older than age 58. Take precautions to prevent a fall at home.  Work with your health care provider to learn what changes you can make to improve your health and wellness and to prevent falls. This information is not intended to replace advice given to you by your health care provider. Make sure you discuss any questions you have with your health care provider. Document Released: 11/09/2016 Document Revised: 04/19/2018 Document Reviewed: 11/09/2016 Elsevier Patient Education  2020 [employer].

## 2018-07-24 ENCOUNTER — Other Ambulatory Visit: Payer: Medicare Other

## 2018-07-26 ENCOUNTER — Telehealth: Payer: Self-pay

## 2018-07-26 ENCOUNTER — Other Ambulatory Visit (INDEPENDENT_AMBULATORY_CARE_PROVIDER_SITE_OTHER): Payer: Medicare Other

## 2018-07-26 ENCOUNTER — Other Ambulatory Visit: Payer: Self-pay

## 2018-07-26 DIAGNOSIS — Z20822 Contact with and (suspected) exposure to covid-19: Secondary | ICD-10-CM

## 2018-07-26 DIAGNOSIS — R7989 Other specified abnormal findings of blood chemistry: Secondary | ICD-10-CM | POA: Diagnosis not present

## 2018-07-26 DIAGNOSIS — Z20828 Contact with and (suspected) exposure to other viral communicable diseases: Secondary | ICD-10-CM

## 2018-07-26 DIAGNOSIS — E559 Vitamin D deficiency, unspecified: Secondary | ICD-10-CM | POA: Diagnosis not present

## 2018-07-26 DIAGNOSIS — R739 Hyperglycemia, unspecified: Secondary | ICD-10-CM | POA: Diagnosis not present

## 2018-07-26 DIAGNOSIS — E785 Hyperlipidemia, unspecified: Secondary | ICD-10-CM

## 2018-07-26 DIAGNOSIS — E1169 Type 2 diabetes mellitus with other specified complication: Secondary | ICD-10-CM

## 2018-07-26 DIAGNOSIS — I10 Essential (primary) hypertension: Secondary | ICD-10-CM | POA: Diagnosis not present

## 2018-07-26 LAB — TESTOSTERONE: Testosterone: 430.49 ng/dL (ref 300.00–890.00)

## 2018-07-26 LAB — COMPREHENSIVE METABOLIC PANEL
ALT: 61 U/L — ABNORMAL HIGH (ref 0–53)
AST: 52 U/L — ABNORMAL HIGH (ref 0–37)
Albumin: 4.4 g/dL (ref 3.5–5.2)
Alkaline Phosphatase: 117 U/L (ref 39–117)
BUN: 14 mg/dL (ref 6–23)
CO2: 27 mEq/L (ref 19–32)
Calcium: 9.1 mg/dL (ref 8.4–10.5)
Chloride: 103 mEq/L (ref 96–112)
Creatinine, Ser: 0.91 mg/dL (ref 0.40–1.50)
GFR: 82.57 mL/min (ref >60.00)
Glucose, Bld: 282 mg/dL — ABNORMAL HIGH (ref 70–99)
Potassium: 4.2 mEq/L (ref 3.5–5.1)
Sodium: 139 mEq/L (ref 135–145)
Total Bilirubin: 1.7 mg/dL — ABNORMAL HIGH (ref 0.2–1.2)
Total Protein: 7.1 g/dL (ref 6.0–8.3)

## 2018-07-26 LAB — HEMOGLOBIN A1C: Hgb A1c MFr Bld: 9.7 % — ABNORMAL HIGH (ref 4.6–6.5)

## 2018-07-26 LAB — CBC WITH DIFFERENTIAL/PLATELET
Basophils Absolute: 0 10*3/uL (ref 0.0–0.1)
Basophils Relative: 0.9 % (ref 0.0–3.0)
Eosinophils Absolute: 0.1 10*3/uL (ref 0.0–0.7)
Eosinophils Relative: 2.8 % (ref 0.0–5.0)
HCT: 46.2 % (ref 39.0–52.0)
Hemoglobin: 16.1 g/dL (ref 13.0–17.0)
Lymphocytes Relative: 24.7 % (ref 12.0–46.0)
Lymphs Abs: 1.3 10*3/uL (ref 0.7–4.0)
MCHC: 34.8 g/dL (ref 30.0–36.0)
MCV: 97.8 fl (ref 78.0–100.0)
Monocytes Absolute: 0.4 10*3/uL (ref 0.1–1.0)
Monocytes Relative: 8.1 % (ref 3.0–12.0)
Neutro Abs: 3.3 10*3/uL (ref 1.4–7.7)
Neutrophils Relative %: 63.5 % (ref 43.0–77.0)
Platelets: 99 10*3/uL — ABNORMAL LOW (ref 150.0–400.0)
RBC: 4.73 Mil/uL (ref 4.22–5.81)
RDW: 13.3 % (ref 11.5–15.5)
WBC: 5.1 10*3/uL (ref 4.0–10.5)

## 2018-07-26 LAB — LIPID PANEL
Cholesterol: 133 mg/dL (ref 0–200)
HDL: 44.9 mg/dL (ref >39.00)
LDL Cholesterol: 58 mg/dL (ref 0–99)
NonHDL: 88.18
Total CHOL/HDL Ratio: 3
Triglycerides: 152 mg/dL — ABNORMAL HIGH (ref 0.0–149.0)
VLDL: 30.4 mg/dL (ref 0.0–40.0)

## 2018-07-26 LAB — VITAMIN D 25 HYDROXY (VIT D DEFICIENCY, FRACTURES): VITD: 17.68 ng/mL — ABNORMAL LOW (ref 30.00–100.00)

## 2018-07-26 LAB — TSH: TSH: 2.45 u[IU]/mL (ref 0.35–4.50)

## 2018-07-26 NOTE — Telephone Encounter (Signed)
Patient in the lab today for testing. Patient tested for antibody for COVID

## 2018-07-27 LAB — SAR COV2 SEROLOGY (COVID19)AB(IGG),IA: SARS CoV2 AB IGG: NEGATIVE

## 2018-07-27 MED ORDER — VITAMIN D (ERGOCALCIFEROL) 1.25 MG (50000 UNIT) PO CAPS: 50000.0000 [IU] | ORAL_CAPSULE | ORAL | 4 refills | Status: DC

## 2018-07-27 MED ORDER — METFORMIN HCL 500 MG PO TABS: 500.0000 mg | ORAL_TABLET | Freq: Two times a day (BID) | ORAL | 3 refills | Status: DC

## 2018-07-27 NOTE — Addendum Note (Signed)
Addended by: Magdalene Molly A on: 07/27/2018 03:10 PM   Modules accepted: Orders

## 2018-07-30 ENCOUNTER — Ambulatory Visit: Payer: Medicare Other | Admitting: Family Medicine

## 2018-07-30 ENCOUNTER — Other Ambulatory Visit: Payer: Self-pay

## 2018-07-31 ENCOUNTER — Other Ambulatory Visit: Payer: Self-pay

## 2018-07-31 ENCOUNTER — Ambulatory Visit (INDEPENDENT_AMBULATORY_CARE_PROVIDER_SITE_OTHER): Payer: Medicare Other | Admitting: Family Medicine

## 2018-07-31 DIAGNOSIS — E669 Obesity, unspecified: Secondary | ICD-10-CM | POA: Diagnosis not present

## 2018-07-31 DIAGNOSIS — I10 Essential (primary) hypertension: Secondary | ICD-10-CM

## 2018-07-31 DIAGNOSIS — E1169 Type 2 diabetes mellitus with other specified complication: Secondary | ICD-10-CM | POA: Diagnosis not present

## 2018-07-31 DIAGNOSIS — E559 Vitamin D deficiency, unspecified: Secondary | ICD-10-CM

## 2018-07-31 DIAGNOSIS — R739 Hyperglycemia, unspecified: Secondary | ICD-10-CM | POA: Diagnosis not present

## 2018-07-31 DIAGNOSIS — E785 Hyperlipidemia, unspecified: Secondary | ICD-10-CM | POA: Diagnosis not present

## 2018-07-31 DIAGNOSIS — F419 Anxiety disorder, unspecified: Secondary | ICD-10-CM | POA: Diagnosis not present

## 2018-07-31 MED ORDER — LANCETS MISC: 0 refills | Status: DC

## 2018-07-31 MED ORDER — ALPRAZOLAM 1 MG PO TABS: ORAL_TABLET | ORAL | 1 refills | Status: DC

## 2018-07-31 MED ORDER — BLOOD GLUCOSE MONITOR KIT: PACK | 0 refills | Status: DC

## 2018-07-31 MED ORDER — IGLUCOSE TEST STRIPS VI STRP: ORAL_STRIP | 12 refills | Status: DC

## 2018-08-01 ENCOUNTER — Other Ambulatory Visit: Payer: Self-pay

## 2018-08-01 ENCOUNTER — Telehealth: Payer: Self-pay | Admitting: *Deleted

## 2018-08-01 ENCOUNTER — Other Ambulatory Visit: Payer: Medicare Other

## 2018-08-01 DIAGNOSIS — I10 Essential (primary) hypertension: Secondary | ICD-10-CM

## 2018-08-01 DIAGNOSIS — E785 Hyperlipidemia, unspecified: Secondary | ICD-10-CM

## 2018-08-01 DIAGNOSIS — R739 Hyperglycemia, unspecified: Secondary | ICD-10-CM

## 2018-08-01 DIAGNOSIS — E559 Vitamin D deficiency, unspecified: Secondary | ICD-10-CM

## 2018-08-01 DIAGNOSIS — E1169 Type 2 diabetes mellitus with other specified complication: Secondary | ICD-10-CM

## 2018-08-01 NOTE — Telephone Encounter (Signed)
Please order these labs for mr [facility] and make him an appointment in 3 months

## 2018-08-01 NOTE — Telephone Encounter (Signed)
He does not need labs so soon that is correct he needs them in 3 months. Please order

## 2018-08-01 NOTE — Telephone Encounter (Signed)
Ok, will someone be placing future lab orders prior to his follow up that he scheduled for October?

## 2018-08-01 NOTE — Telephone Encounter (Signed)
Dr Charlett Blake -- pt was scheduled to come in today for a lab appt. Pt had labs 07/26/18 and lab note does not say that pt needed to return to follow up on any labs this soon. AVS from yesterday says pt should return for labs and follow up with PCP in 3 months. Pt has been rescheduled. Please advise if any labs are due now? If nothing needed now can you place future orders for lab visit in 3 months prior to his follow up?

## 2018-08-01 NOTE — Telephone Encounter (Signed)
Sorry Dr. Charlett Blake did not mean to send back to you

## 2018-08-02 NOTE — Telephone Encounter (Signed)
Please schedule patient a lab appt 3 months from now then a visit a few days after with PCP

## 2018-08-03 NOTE — Assessment & Plan Note (Signed)
Encouraged to check vitals weekly no changes to meds. Encouraged heart healthy diet such as the DASH diet and exercise as tolerated.  

## 2018-08-03 NOTE — Assessment & Plan Note (Signed)
Tolerating statin, encouraged heart healthy diet, avoid trans fats, minimize simple carbs and saturated fats. Increase exercise as tolerated 

## 2018-08-03 NOTE — Assessment & Plan Note (Signed)
Supplement and monitor 

## 2018-08-03 NOTE — Assessment & Plan Note (Signed)
He has changed to a plant based for the most part over the past few weeks. Encouraged to increase activity and continue

## 2018-08-03 NOTE — Assessment & Plan Note (Signed)
hgba1c unacceptable, minimize simple carbs. Increase exercise as tolerated. Metformin 500 mg po bid

## 2018-08-03 NOTE — Progress Notes (Signed)
Virtual Visit via Phone Note  I connected with David Esparza on 07/31/2018 at  1:40 PM EDT by a phone enabled telemedicine application and verified that I am speaking with the correct person using two identifiers.  Location: Patient: home Provider: home   I discussed the limitations of evaluation and management by telemedicine and the availability of in person appointments. The patient expressed understanding and agreed to proceed. David Esparza CMA was able to get patient set up on phone visit    Subjective:    Patient ID: David Esparza, male    DOB: 08/15/1950, 68 y.o.   MRN: [id number]  No chief complaint on file.   HPI Patient is in today for follow up on chronic medical concerns including hypertension, diabetes, obesity and more. He is feeling well today. No recent febrile illness or hospitalizations. No c/o polyuria or polydipsia. He is working full time and more but is trying to maintain quarantine as much as possible. Denies CP/palp/SOB/HA/congestion/fevers/GI or GU c/o. Taking meds as prescribed  Past Medical History:  Diagnosis Date  . Abnormal LFTs 09/06/2011  . Acute bronchitis 08/16/2010  . Anxiety 09/06/2011  . Chicken pox as a child  . Cutaneous skin tags 07/27/2016  . Fatigue   . Gout   . Gout   . Grief reaction 09/09/2012  . Hyperglycemia 09/06/2011  . Hyperlipidemia 09/06/2011  . Hypertension   . Insomnia 08/16/2010  . Knee pain, acute 02/28/2012  . Low testosterone 02/28/2012  . Measles as a child  . Mumps as a child  . Nocturia 08/16/2010  . Obese   . Preventative health care 09/06/2011  . Sleep apnea 09/06/2011  . Unspecified vitamin D deficiency 02/28/2012    Past Surgical History:  Procedure Laterality Date  . BACK SURGERY    . COLONOSCOPY  09/16/10   Dr. Kaplan: moderate diverticulosis sigmoid and descending colon, o/w normal: repeat 10 yrs.  . intestines sewn  68 yrs old   shot once made 6 holes  . TONSILLECTOMY  as a child  . VASECTOMY  29 yrs old    Family History  Problem Relation Age of Onset  . Diabetes Mother        type 2  . Cancer Maternal Grandmother        throat  . Heart disease Maternal Grandfather   . Diabetes Maternal Grandfather   . Depression Daughter        anxiety  . Graves' disease Daughter   . Colon polyps Neg Hx     Social History   Socioeconomic History  . Marital status: Married    Spouse name: Not on file  . Number of children: Not on file  . Years of education: Not on file  . Highest education level: Not on file  Occupational History  . Not on file  Social Needs  . Financial resource strain: Not on file  . Food insecurity    Worry: Not on file    Inability: Not on file  . Transportation needs    Medical: Not on file    Non-medical: Not on file  Tobacco Use  . Smoking status: Former Smoker    Types: Cigarettes    Quit date: 08/30/1968    Years since quitting: 49.9  . Smokeless tobacco: Never Used  . Tobacco comment: smoked in high school  Substance and Sexual Activity  . Alcohol use: No    Comment: very little  . Drug use: No  . Sexual activity: Yes      Partners: Female  Lifestyle  . Physical activity    Days per week: Not on file    Minutes per session: Not on file  . Stress: Not on file  Relationships  . Social Herbalist on phone: Not on file    Gets together: Not on file    Attends religious service: Not on file    Active member of club or organization: Not on file    Attends meetings of clubs or organizations: Not on file    Relationship status: Not on file  . Intimate partner violence    Fear of current or ex partner: Not on file    Emotionally abused: Not on file    Physically abused: Not on file    Forced sexual activity: Not on file  Other Topics Concern  . Not on file  Social History Narrative  . Not on file    Outpatient Medications Prior to Visit  Medication Sig Dispense Refill  . allopurinol (ZYLOPRIM) 300 MG tablet Take 1 tablet (300 mg total) by  mouth daily. 90 tablet 1  . amLODipine (NORVASC) 5 MG tablet TAKE 1 TABLET BY MOUTH DAILY. 90 tablet 1  . atorvastatin (LIPITOR) 10 MG tablet Take 1 tablet (10 mg total) by mouth at bedtime. 30 tablet 5  . b complex vitamins capsule Take 1 capsule by mouth every other day.    Marland Kitchen COLCRYS 0.6 MG tablet TAKE 1 TABLET BY MOUTH DAILY FOR GOUT FLARE-UP (Patient not taking: Reported on 07/20/2018) 30 tablet 3  . fish oil-omega-3 fatty acids 1000 MG capsule Take 2 g by mouth daily as needed.     . fluocinolone (VANOS) 0.01 % cream Apply topically 2 (two) times daily. 30 g 1  . losartan-hydrochlorothiazide (HYZAAR) 100-25 MG tablet Take 1 tablet by mouth daily. 90 tablet 1  . metFORMIN (GLUCOPHAGE) 500 MG tablet Take 1 tablet (500 mg total) by mouth 2 (two) times daily with a meal. 180 tablet 3  . multivitamin (THERAGRAN) tablet Take 1 tablet by mouth daily.      . sildenafil (VIAGRA) 100 MG tablet Take 0.5-1 tablets (50-100 mg total) by mouth daily as needed for erectile dysfunction. 30 tablet 2  . Vitamin D, Ergocalciferol, (DRISDOL) 1.25 MG (50000 UT) CAPS capsule Take 1 capsule (50,000 Units total) by mouth every 7 (seven) days. 4 capsule 4  . ALPRAZolam (XANAX) 1 MG tablet TAKE 1 TABLET BY MOUTH 2 TIMES DAILY AS NEEDED FOR SLEEP. 30 tablet 1   No facility-administered medications prior to visit.     No Known Allergies  Review of Systems  Constitutional: Positive for malaise/fatigue. Negative for fever.  HENT: Negative for congestion.   Eyes: Negative for blurred vision.  Respiratory: Negative for shortness of breath.   Cardiovascular: Negative for chest pain, palpitations and leg swelling.  Gastrointestinal: Negative for abdominal pain, blood in stool and nausea.  Genitourinary: Negative for dysuria and frequency.  Musculoskeletal: Negative for falls.  Skin: Negative for rash.  Neurological: Negative for dizziness, loss of consciousness and headaches.  Endo/Heme/Allergies: Negative for  environmental allergies.  Psychiatric/Behavioral: Negative for depression. The patient is not nervous/anxious.        Objective:    Physical Exam Unable to obtain via phone  There were no vitals taken for this visit. Wt Readings from Last 3 Encounters:  03/06/18 267 lb 6.4 oz (121.3 kg)  01/08/18 270 lb (122.5 kg)  08/21/17 266 lb 12.8 oz (121 kg)  Diabetic Foot Exam - Simple   No data filed     Lab Results  Component Value Date   WBC 5.1 07/26/2018   HGB 16.1 07/26/2018   HCT 46.2 07/26/2018   PLT 99.0 (L) 07/26/2018   GLUCOSE 282 (H) 07/26/2018   CHOL 133 07/26/2018   TRIG 152.0 (H) 07/26/2018   HDL 44.90 07/26/2018   LDLDIRECT 87.0 01/08/2018   LDLCALC 58 07/26/2018   ALT 61 (H) 07/26/2018   AST 52 (H) 07/26/2018   NA 139 07/26/2018   K 4.2 07/26/2018   CL 103 07/26/2018   CREATININE 0.91 07/26/2018   BUN 14 07/26/2018   CO2 27 07/26/2018   TSH 2.45 07/26/2018   PSA 0.60 01/08/2018   HGBA1C 9.7 (H) 07/26/2018   MICROALBUR 1.55 02/21/2012    Lab Results  Component Value Date   TSH 2.45 07/26/2018   Lab Results  Component Value Date   WBC 5.1 07/26/2018   HGB 16.1 07/26/2018   HCT 46.2 07/26/2018   MCV 97.8 07/26/2018   PLT 99.0 (L) 07/26/2018   Lab Results  Component Value Date   NA 139 07/26/2018   K 4.2 07/26/2018   CO2 27 07/26/2018   GLUCOSE 282 (H) 07/26/2018   BUN 14 07/26/2018   CREATININE 0.91 07/26/2018   BILITOT 1.7 (H) 07/26/2018   ALKPHOS 117 07/26/2018   AST 52 (H) 07/26/2018   ALT 61 (H) 07/26/2018   PROT 7.1 07/26/2018   ALBUMIN 4.4 07/26/2018   CALCIUM 9.1 07/26/2018   GFR 82.57 07/26/2018   Lab Results  Component Value Date   CHOL 133 07/26/2018   Lab Results  Component Value Date   HDL 44.90 07/26/2018   Lab Results  Component Value Date   LDLCALC 58 07/26/2018   Lab Results  Component Value Date   TRIG 152.0 (H) 07/26/2018   Lab Results  Component Value Date   CHOLHDL 3 07/26/2018   Lab Results   Component Value Date   HGBA1C 9.7 (H) 07/26/2018       Assessment & Plan:   Problem List Items Addressed This Visit    Hypertension    Encouraged to check vitals weekly no changes to meds. Encouraged heart healthy diet such as the DASH diet and exercise as tolerated.       Obese    He has changed to a plant based for the most part over the past few weeks. Encouraged to increase activity and continue      Anxiety   Relevant Medications   ALPRAZolam (XANAX) 1 MG tablet   Type 2 diabetes mellitus with obesity (HCC)    hgba1c unacceptable, minimize simple carbs. Increase exercise as tolerated. Metformin 500 mg po bid      Relevant Medications   ALPRAZolam (XANAX) 1 MG tablet   Vitamin D deficiency    Supplement and monitor      Hyperlipidemia associated with type 2 diabetes mellitus ([place])    Tolerating statin, encouraged heart healthy diet, avoid trans fats, minimize simple carbs and saturated fats. Increase exercise as tolerated         I am having Seger L. Wilbanks "Sam" start on blood glucose meter kit and supplies, IGlucose Test Strips, and Lancets. I am also having him maintain his multivitamin, fish oil-omega-3 fatty acids, b complex vitamins, atorvastatin, losartan-hydrochlorothiazide, fluocinolone, sildenafil, Colcrys, amLODipine, allopurinol, metFORMIN, Vitamin D (Ergocalciferol), and ALPRAZolam.  Meds ordered this encounter  Medications  . ALPRAZolam (XANAX) 1 MG tablet  Sig: TAKE 1 TABLET BY MOUTH 2 TIMES DAILY AS NEEDED FOR SLEEP.    Dispense:  30 tablet    Refill:  1  . blood glucose meter kit and supplies KIT    Sig: Dispense based on patient and insurance preference. Check BC daily and PRN Dx;E11.9    Dispense:  1 each    Refill:  0    Order Specific Question:   Number of strips    Answer:   100    Order Specific Question:   Number of lancets    Answer:   100  . glucose blood (IGLUCOSE TEST STRIPS) test strip    Sig: Use as instructed. Check BS daily  and PRN dx: E11.9Dispense based on patient and insurance preference    Dispense:  100 each    Refill:  12  . Lancets MISC    Sig: Check BS daily and PRN. Dx: E11.9 Dispense based on patient and insurance preference    Dispense:  100 each    Refill:  0     I discussed the assessment and treatment plan with the patient. The patient was provided an opportunity to ask questions and all were answered. The patient agreed with the plan and demonstrated an understanding of the instructions.   The patient was advised to call back or seek an in-person evaluation if the symptoms worsen or if the condition fails to improve as anticipated.  I provided 25 minutes of non-face-to-face time during this encounter.   Stacey Blyth, MD  

## 2018-08-22 ENCOUNTER — Telehealth: Payer: Self-pay | Admitting: Family Medicine

## 2018-08-22 NOTE — Telephone Encounter (Signed)
I will need you to check with him and document why he stopped the Atorvastatin and put it in his allergies. I do not have documented that he has taken any other statin since he has been with [place] but he may have had another statin before he joinedour practice so check that also. Then let pharmacy know if he is statin intolerant.

## 2018-08-22 NOTE — Telephone Encounter (Signed)
Please advise 

## 2018-08-22 NOTE — Telephone Encounter (Signed)
Amy, called from [facility], calling and is requesting to have PCP go over pt chart and verify wether pt has an intolerance to statins for medicare checks. Please advise.    [phone] callback #

## 2018-08-23 NOTE — Telephone Encounter (Signed)
Yes given how much worse his sugar has gotten we now want his cholesterol even lower so he should start his statin back daily

## 2018-08-23 NOTE — Telephone Encounter (Signed)
Patient stated that he had a large amount of supply and did not need a refill.  He did not have an intolerance and has not been taking.  He asks do you want him on it?  He will take if he needs to be on it.

## 2018-08-31 NOTE — Telephone Encounter (Signed)
Called patient left message for patient to call the office back  Nurse triage may handle 

## 2018-09-19 ENCOUNTER — Other Ambulatory Visit: Payer: Self-pay | Admitting: Family Medicine

## 2018-10-29 ENCOUNTER — Other Ambulatory Visit (INDEPENDENT_AMBULATORY_CARE_PROVIDER_SITE_OTHER): Payer: Medicare Other

## 2018-10-29 ENCOUNTER — Other Ambulatory Visit: Payer: Self-pay

## 2018-10-29 DIAGNOSIS — E1169 Type 2 diabetes mellitus with other specified complication: Secondary | ICD-10-CM

## 2018-10-29 DIAGNOSIS — E785 Hyperlipidemia, unspecified: Secondary | ICD-10-CM | POA: Diagnosis not present

## 2018-10-29 DIAGNOSIS — I10 Essential (primary) hypertension: Secondary | ICD-10-CM | POA: Diagnosis not present

## 2018-10-29 DIAGNOSIS — E559 Vitamin D deficiency, unspecified: Secondary | ICD-10-CM

## 2018-10-29 DIAGNOSIS — R739 Hyperglycemia, unspecified: Secondary | ICD-10-CM | POA: Diagnosis not present

## 2018-10-29 LAB — COMPREHENSIVE METABOLIC PANEL
ALT: 64 U/L — ABNORMAL HIGH (ref 0–53)
AST: 48 U/L — ABNORMAL HIGH (ref 0–37)
Albumin: 4.3 g/dL (ref 3.5–5.2)
Alkaline Phosphatase: 136 U/L — ABNORMAL HIGH (ref 39–117)
BUN: 15 mg/dL (ref 6–23)
CO2: 29 mEq/L (ref 19–32)
Calcium: 9.4 mg/dL (ref 8.4–10.5)
Chloride: 99 mEq/L (ref 96–112)
Creatinine, Ser: 0.95 mg/dL (ref 0.40–1.50)
GFR: 78.52 mL/min (ref >60.00)
Glucose, Bld: 319 mg/dL — ABNORMAL HIGH (ref 70–99)
Potassium: 3.9 mEq/L (ref 3.5–5.1)
Sodium: 138 mEq/L (ref 135–145)
Total Bilirubin: 1.5 mg/dL — ABNORMAL HIGH (ref 0.2–1.2)
Total Protein: 7.2 g/dL (ref 6.0–8.3)

## 2018-10-29 LAB — CBC
HCT: 51.1 % (ref 39.0–52.0)
Hemoglobin: 17.7 g/dL — ABNORMAL HIGH (ref 13.0–17.0)
MCHC: 34.7 g/dL (ref 30.0–36.0)
MCV: 95.3 fl (ref 78.0–100.0)
Platelets: 111 10*3/uL — ABNORMAL LOW (ref 150.0–400.0)
RBC: 5.36 Mil/uL (ref 4.22–5.81)
RDW: 13.7 % (ref 11.5–15.5)
WBC: 6.8 10*3/uL (ref 4.0–10.5)

## 2018-10-29 LAB — LIPID PANEL
Cholesterol: 188 mg/dL (ref 0–200)
HDL: 37.3 mg/dL — ABNORMAL LOW (ref >39.00)
NonHDL: 150.95
Total CHOL/HDL Ratio: 5
Triglycerides: 293 mg/dL — ABNORMAL HIGH (ref 0.0–149.0)
VLDL: 58.6 mg/dL — ABNORMAL HIGH (ref 0.0–40.0)

## 2018-10-29 LAB — TSH: TSH: 3.04 u[IU]/mL (ref 0.35–4.50)

## 2018-10-29 LAB — VITAMIN D 25 HYDROXY (VIT D DEFICIENCY, FRACTURES): VITD: 18.61 ng/mL — ABNORMAL LOW (ref 30.00–100.00)

## 2018-10-29 LAB — HEMOGLOBIN A1C: Hgb A1c MFr Bld: 11.3 % — ABNORMAL HIGH (ref 4.6–6.5)

## 2018-10-29 LAB — LDL CHOLESTEROL, DIRECT: Direct LDL: 80 mg/dL

## 2018-10-30 ENCOUNTER — Other Ambulatory Visit: Payer: Self-pay

## 2018-11-01 ENCOUNTER — Ambulatory Visit (INDEPENDENT_AMBULATORY_CARE_PROVIDER_SITE_OTHER): Payer: Medicare Other | Admitting: Family Medicine

## 2018-11-01 ENCOUNTER — Other Ambulatory Visit: Payer: Self-pay

## 2018-11-01 VITALS — BP 136/88 | HR 97 | Temp 97.1°F | Resp 18 | Wt 252.0 lb

## 2018-11-01 DIAGNOSIS — I10 Essential (primary) hypertension: Secondary | ICD-10-CM

## 2018-11-01 DIAGNOSIS — E785 Hyperlipidemia, unspecified: Secondary | ICD-10-CM

## 2018-11-01 DIAGNOSIS — E559 Vitamin D deficiency, unspecified: Secondary | ICD-10-CM

## 2018-11-01 DIAGNOSIS — R739 Hyperglycemia, unspecified: Secondary | ICD-10-CM | POA: Diagnosis not present

## 2018-11-01 DIAGNOSIS — E1169 Type 2 diabetes mellitus with other specified complication: Secondary | ICD-10-CM | POA: Diagnosis not present

## 2018-11-01 DIAGNOSIS — E669 Obesity, unspecified: Secondary | ICD-10-CM | POA: Diagnosis not present

## 2018-11-01 MED ORDER — METFORMIN HCL 1000 MG PO TABS: 1000.0000 mg | ORAL_TABLET | Freq: Two times a day (BID) | ORAL | 1 refills | Status: DC

## 2018-11-01 NOTE — Patient Instructions (Addendum)
Vitamin D 2000 IU daily  Omron blood pressure cuff, upper arm  Pulse oximeter, want oxygen in 90s  Multivitamin with minerals, selenium, zinc, vitamin c and vitamin d  Weekly vitals   Add Benefiber to a beverage twice a day  Imodium as needed for diarrhea     Carbohydrate Counting for Diabetes Mellitus, Adult  Carbohydrate counting is a method of keeping track of how many carbohydrates you eat. Eating carbohydrates naturally increases the amount of sugar (glucose) in the blood. Counting how many carbohydrates you eat helps keep your blood glucose within normal limits, which helps you manage your diabetes (diabetes mellitus). It is important to know how many carbohydrates you can safely have in each meal. This is different for every person. A diet and nutrition specialist (registered dietitian) can help you make a meal plan and calculate how many carbohydrates you should have at each meal and snack. Carbohydrates are found in the following foods:  Grains, such as breads and cereals.  Dried beans and soy products.  Starchy vegetables, such as potatoes, peas, and corn.  Fruit and fruit juices.  Milk and yogurt.  Sweets and snack foods, such as cake, cookies, candy, chips, and soft drinks. How do I count carbohydrates? There are two ways to count carbohydrates in food. You can use either of the methods or a combination of both. Reading "Nutrition Facts" on packaged food The "Nutrition Facts" list is included on the labels of almost all packaged foods and beverages in the [place] It includes:  The serving size.  Information about nutrients in each serving, including the grams (g) of carbohydrate per serving. To use the "Nutrition Facts":  Decide how many servings you will have.  Multiply the number of servings by the number of carbohydrates per serving.  The resulting number is the total amount of carbohydrates that you will be having. Learning standard serving sizes of other  foods When you eat carbohydrate foods that are not packaged or do not include "Nutrition Facts" on the label, you need to measure the servings in order to count the amount of carbohydrates:  Measure the foods that you will eat with a food scale or measuring cup, if needed.  Decide how many standard-size servings you will eat.  Multiply the number of servings by 15. Most carbohydrate-rich foods have about 15 g of carbohydrates per serving. ? For example, if you eat 8 oz (170 g) of strawberries, you will have eaten 2 servings and 30 g of carbohydrates (2 servings x 15 g = 30 g).  For foods that have more than one food mixed, such as soups and casseroles, you must count the carbohydrates in each food that is included. The following list contains standard serving sizes of common carbohydrate-rich foods. Each of these servings has about 15 g of carbohydrates:   hamburger bun or  English muffin.   oz (15 mL) syrup.   oz (14 g) jelly.  1 slice of bread.  1 six-inch tortilla.  3 oz (85 g) cooked rice or pasta.  4 oz (113 g) cooked dried beans.  4 oz (113 g) starchy vegetable, such as peas, corn, or potatoes.  4 oz (113 g) hot cereal.  4 oz (113 g) mashed potatoes or  of a large baked potato.  4 oz (113 g) canned or frozen fruit.  4 oz (120 mL) fruit juice.  4-6 crackers.  6 chicken nuggets.  6 oz (170 g) unsweetened dry cereal.  6 oz (170 g)  plain fat-free yogurt or yogurt sweetened with artificial sweeteners.  8 oz (240 mL) milk.  8 oz (170 g) fresh fruit or one small piece of fruit.  24 oz (680 g) popped popcorn. Example of carbohydrate counting Sample meal  3 oz (85 g) chicken breast.  6 oz (170 g) brown rice.  4 oz (113 g) corn.  8 oz (240 mL) milk.  8 oz (170 g) strawberries with sugar-free whipped topping. Carbohydrate calculation 1. Identify the foods that contain carbohydrates: ? Rice. ? Corn. ? Milk. ? Strawberries. 2. Calculate how many  servings you have of each food: ? 2 servings rice. ? 1 serving corn. ? 1 serving milk. ? 1 serving strawberries. 3. Multiply each number of servings by 15 g: ? 2 servings rice x 15 g = 30 g. ? 1 serving corn x 15 g = 15 g. ? 1 serving milk x 15 g = 15 g. ? 1 serving strawberries x 15 g = 15 g. 4. Add together all of the amounts to find the total grams of carbohydrates eaten: ? 30 g + 15 g + 15 g + 15 g = 75 g of carbohydrates total. Summary  Carbohydrate counting is a method of keeping track of how many carbohydrates you eat.  Eating carbohydrates naturally increases the amount of sugar (glucose) in the blood.  Counting how many carbohydrates you eat helps keep your blood glucose within normal limits, which helps you manage your diabetes.  A diet and nutrition specialist (registered dietitian) can help you make a meal plan and calculate how many carbohydrates you should have at each meal and snack. This information is not intended to replace advice given to you by your health care provider. Make sure you discuss any questions you have with your health care provider. Document Released: 12/27/2004 Document Revised: 07/21/2016 Document Reviewed: 06/10/2015 Elsevier Patient Education  2020 [employer].

## 2018-11-04 NOTE — Assessment & Plan Note (Signed)
Encouraged DASH diet, decrease po intake and increase exercise as tolerated. Needs 7-8 hours of sleep nightly. Avoid trans fats, eat small, frequent meals every 4-5 hours with lean proteins, complex carbs and healthy fats. Minimize simple carbs, consider WW APP 

## 2018-11-04 NOTE — Progress Notes (Signed)
Subjective:    Patient ID: David Esparza, male    DOB: 02/11/1950, 68 y.o.   MRN: [id number]  No chief complaint on file.   HPI Patient is in today for follow up on chronic medical concerns including obesity, diabetes, hypertension and more. No recent febrile illness or hospitalizations. He has lost some weight since the last visit. He is trying to eat better and stay active. Denies CP/palp/SOB/HA/congestion/fevers/GI or GU c/o. Taking meds as prescribed  Past Medical History:  Diagnosis Date  . Abnormal LFTs 09/06/2011  . Acute bronchitis 08/16/2010  . Anxiety 09/06/2011  . Chicken pox as a child  . Cutaneous skin tags 07/27/2016  . Fatigue   . Gout   . Gout   . Grief reaction 09/09/2012  . Hyperglycemia 09/06/2011  . Hyperlipidemia 09/06/2011  . Hypertension   . Insomnia 08/16/2010  . Knee pain, acute 02/28/2012  . Low testosterone 02/28/2012  . Measles as a child  . Mumps as a child  . Nocturia 08/16/2010  . Obese   . Preventative health care 09/06/2011  . Sleep apnea 09/06/2011  . Unspecified vitamin D deficiency 02/28/2012    Past Surgical History:  Procedure Laterality Date  . BACK SURGERY    . COLONOSCOPY  09/16/10   Dr. Deatra Ina: moderate diverticulosis sigmoid and descending colon, o/w normal: repeat 10 yrs.  . intestines sewn  67 yrs old   shot once made 6 holes  . TONSILLECTOMY  as a child  . VASECTOMY  60 yrs old    Family History  Problem Relation Age of Onset  . Diabetes Mother        type 2  . Cancer Maternal Grandmother        throat  . Heart disease Maternal Grandfather   . Diabetes Maternal Grandfather   . Depression Daughter        anxiety  . Graves' disease Daughter   . Colon polyps Neg Hx     Social History   Socioeconomic History  . Marital status: Married    Spouse name: Not on file  . Number of children: Not on file  . Years of education: Not on file  . Highest education level: Not on file  Occupational History  . Not on file  Social Needs   . Financial resource strain: Not on file  . Food insecurity    Worry: Not on file    Inability: Not on file  . Transportation needs    Medical: Not on file    Non-medical: Not on file  Tobacco Use  . Smoking status: Former Smoker    Types: Cigarettes    Quit date: 08/30/1968    Years since quitting: 50.2  . Smokeless tobacco: Never Used  . Tobacco comment: smoked in high school  Substance and Sexual Activity  . Alcohol use: No    Comment: very little  . Drug use: No  . Sexual activity: Yes    Partners: Female  Lifestyle  . Physical activity    Days per week: Not on file    Minutes per session: Not on file  . Stress: Not on file  Relationships  . Social Herbalist on phone: Not on file    Gets together: Not on file    Attends religious service: Not on file    Active member of club or organization: Not on file    Attends meetings of clubs or organizations: Not on file  Relationship status: Not on file  . Intimate partner violence    Fear of current or ex partner: Not on file    Emotionally abused: Not on file    Physically abused: Not on file    Forced sexual activity: Not on file  Other Topics Concern  . Not on file  Social History Narrative  . Not on file    Outpatient Medications Prior to Visit  Medication Sig Dispense Refill  . allopurinol (ZYLOPRIM) 300 MG tablet Take 1 tablet (300 mg total) by mouth daily. 90 tablet 1  . ALPRAZolam (XANAX) 1 MG tablet TAKE 1 TABLET BY MOUTH 2 TIMES DAILY AS NEEDED FOR SLEEP. 30 tablet 1  . amLODipine (NORVASC) 5 MG tablet TAKE 1 TABLET BY MOUTH DAILY. 90 tablet 1  . atorvastatin (LIPITOR) 10 MG tablet Take 1 tablet (10 mg total) by mouth at bedtime. 30 tablet 5  . b complex vitamins capsule Take 1 capsule by mouth every other day.    . blood glucose meter kit and supplies KIT Dispense based on patient and insurance preference. Check BC daily and PRN Dx;E11.9 1 each 0  . COLCRYS 0.6 MG tablet TAKE 1 TABLET BY MOUTH  DAILY FOR GOUT FLARE-UP (Patient not taking: Reported on 07/20/2018) 30 tablet 3  . fish oil-omega-3 fatty acids 1000 MG capsule Take 2 g by mouth daily as needed.     . fluocinolone (VANOS) 0.01 % cream Apply topically 2 (two) times daily. 30 g 1  . Lancets MISC Check BS daily and PRN. Dx: E11.9 Dispense based on patient and insurance preference 100 each 0  . losartan-hydrochlorothiazide (HYZAAR) 100-25 MG tablet TAKE 1 TABLET BY MOUTH DAILY. FOR BLOOD PRESSURE 90 tablet 1  . multivitamin (THERAGRAN) tablet Take 1 tablet by mouth daily.      . sildenafil (VIAGRA) 100 MG tablet Take 0.5-1 tablets (50-100 mg total) by mouth daily as needed for erectile dysfunction. 30 tablet 2  . Vitamin D, Ergocalciferol, (DRISDOL) 1.25 MG (50000 UT) CAPS capsule Take 1 capsule (50,000 Units total) by mouth every 7 (seven) days. 4 capsule 4  . glucose blood (IGLUCOSE TEST STRIPS) test strip Use as instructed. Check BS daily and PRN dx: E11.9Dispense based on patient and insurance preference 100 each 12  . metFORMIN (GLUCOPHAGE) 500 MG tablet Take 1 tablet (500 mg total) by mouth 2 (two) times daily with a meal. 180 tablet 3   No facility-administered medications prior to visit.     No Known Allergies  Review of Systems  Constitutional: Positive for malaise/fatigue. Negative for fever.  HENT: Negative for congestion.   Eyes: Negative for blurred vision.  Respiratory: Negative for shortness of breath.   Cardiovascular: Negative for chest pain, palpitations and leg swelling.  Gastrointestinal: Negative for abdominal pain, blood in stool and nausea.  Genitourinary: Negative for dysuria and frequency.  Musculoskeletal: Negative for falls.  Skin: Negative for rash.  Neurological: Negative for dizziness, loss of consciousness and headaches.  Endo/Heme/Allergies: Negative for environmental allergies.  Psychiatric/Behavioral: Negative for depression. The patient is not nervous/anxious.        Objective:     Physical Exam Vitals signs and nursing note reviewed.  Constitutional:      General: He is not in acute distress.    Appearance: He is well-developed.  HENT:     Head: Normocephalic and atraumatic.     Nose: Nose normal.  Eyes:     General:        Right  eye: No discharge.        Left eye: No discharge.  Neck:     Musculoskeletal: Normal range of motion and neck supple.  Cardiovascular:     Rate and Rhythm: Normal rate and regular rhythm.     Heart sounds: No murmur.  Pulmonary:     Effort: Pulmonary effort is normal.     Breath sounds: Normal breath sounds.  Abdominal:     General: Bowel sounds are normal.     Palpations: Abdomen is soft.     Tenderness: There is no abdominal tenderness.  Skin:    General: Skin is warm and dry.  Neurological:     Mental Status: He is alert and oriented to person, place, and time.     BP 136/88 (BP Location: Left Arm, Patient Position: Sitting, Cuff Size: Normal)   Pulse 97   Temp (!) 97.1 F (36.2 C) (Temporal)   Resp 18   Wt 252 lb (114.3 kg)   SpO2 97%   BMI 39.47 kg/m  Wt Readings from Last 3 Encounters:  11/01/18 252 lb (114.3 kg)  03/06/18 267 lb 6.4 oz (121.3 kg)  01/08/18 270 lb (122.5 kg)    Diabetic Foot Exam - Simple   No data filed     Lab Results  Component Value Date   WBC 6.8 10/29/2018   HGB 17.7 (H) 10/29/2018   HCT 51.1 10/29/2018   PLT 111.0 (L) 10/29/2018   GLUCOSE 319 (H) 10/29/2018   CHOL 188 10/29/2018   TRIG 293.0 (H) 10/29/2018   HDL 37.30 (L) 10/29/2018   LDLDIRECT 80.0 10/29/2018   LDLCALC 58 07/26/2018   ALT 64 (H) 10/29/2018   AST 48 (H) 10/29/2018   NA 138 10/29/2018   K 3.9 10/29/2018   CL 99 10/29/2018   CREATININE 0.95 10/29/2018   BUN 15 10/29/2018   CO2 29 10/29/2018   TSH 3.04 10/29/2018   PSA 0.60 01/08/2018   HGBA1C 11.3 (H) 10/29/2018   MICROALBUR 1.55 02/21/2012    Lab Results  Component Value Date   TSH 3.04 10/29/2018   Lab Results  Component Value Date   WBC  6.8 10/29/2018   HGB 17.7 (H) 10/29/2018   HCT 51.1 10/29/2018   MCV 95.3 10/29/2018   PLT 111.0 (L) 10/29/2018   Lab Results  Component Value Date   NA 138 10/29/2018   K 3.9 10/29/2018   CO2 29 10/29/2018   GLUCOSE 319 (H) 10/29/2018   BUN 15 10/29/2018   CREATININE 0.95 10/29/2018   BILITOT 1.5 (H) 10/29/2018   ALKPHOS 136 (H) 10/29/2018   AST 48 (H) 10/29/2018   ALT 64 (H) 10/29/2018   PROT 7.2 10/29/2018   ALBUMIN 4.3 10/29/2018   CALCIUM 9.4 10/29/2018   GFR 78.52 10/29/2018   Lab Results  Component Value Date   CHOL 188 10/29/2018   Lab Results  Component Value Date   HDL 37.30 (L) 10/29/2018   Lab Results  Component Value Date   LDLCALC 58 07/26/2018   Lab Results  Component Value Date   TRIG 293.0 (H) 10/29/2018   Lab Results  Component Value Date   CHOLHDL 5 10/29/2018   Lab Results  Component Value Date   HGBA1C 11.3 (H) 10/29/2018       Assessment & Plan:   Problem List Items Addressed This Visit    Hypertension    Well controlled, no changes to meds. Encouraged heart healthy diet such as the DASH diet and exercise  as tolerated.       Relevant Orders   CBC   Comprehensive metabolic panel   TSH   Obese    Encouraged DASH diet, decrease po intake and increase exercise as tolerated. Needs 7-8 hours of sleep nightly. Avoid trans fats, eat small, frequent meals every 4-5 hours with lean proteins, complex carbs and healthy fats. Minimize simple carbs, consider [employer] APP      Relevant Medications   metFORMIN (GLUCOPHAGE) 1000 MG tablet   Type 2 diabetes mellitus with obesity ([place]) - Primary    hgba1c unacceptable, minimize simple carbs. Increase exercise as tolerated. Continue current meds. Metformin has been increased to 1000 mg bid and he is referred to a nutritionist again. He agrees to go and monitor his sugars, report concerns.       Relevant Medications   metFORMIN (GLUCOPHAGE) 1000 MG tablet   Other Relevant Orders   Amb ref to Medical  Nutrition Therapy-MNT   Hemoglobin A1c   Vitamin D deficiency    Supplement and monitor      Relevant Orders   VITAMIN D 25 Hydroxy (Vit-D Deficiency, Fractures)   Hyperlipidemia associated with type 2 diabetes mellitus ([place])    Tolerating statin, encouraged heart healthy diet, avoid trans fats, minimize simple carbs and saturated fats. Increase exercise as tolerated      Relevant Medications   metFORMIN (GLUCOPHAGE) 1000 MG tablet   Other Relevant Orders   Lipid panel    Other Visit Diagnoses    Hyperglycemia       Relevant Orders   Hemoglobin A1c      I have discontinued Ikechukwu L. Funez "Sam"'s metFORMIN and IGlucose Test Strips. I am also having him start on metFORMIN. Additionally, I am having him maintain his multivitamin, fish oil-omega-3 fatty acids, b complex vitamins, atorvastatin, fluocinolone, sildenafil, Colcrys, amLODipine, allopurinol, Vitamin D (Ergocalciferol), ALPRAZolam, blood glucose meter kit and supplies, Lancets, and losartan-hydrochlorothiazide.  Meds ordered this encounter  Medications  . metFORMIN (GLUCOPHAGE) 1000 MG tablet    Sig: Take 1 tablet (1,000 mg total) by mouth 2 (two) times daily with a meal.    Dispense:  180 tablet    Refill:  1     Penni Homans, MD

## 2018-11-04 NOTE — Assessment & Plan Note (Addendum)
hgba1c unacceptable, minimize simple carbs. Increase exercise as tolerated. Continue current meds. Metformin has been increased to 1000 mg bid and he is referred to a nutritionist again. He agrees to go and monitor his sugars, report concerns.

## 2018-11-04 NOTE — Assessment & Plan Note (Signed)
Well controlled, no changes to meds. Encouraged heart healthy diet such as the DASH diet and exercise as tolerated.  °

## 2018-11-04 NOTE — Assessment & Plan Note (Signed)
Tolerating statin, encouraged heart healthy diet, avoid trans fats, minimize simple carbs and saturated fats. Increase exercise as tolerated 

## 2018-11-04 NOTE — Assessment & Plan Note (Signed)
Supplement and monitor 

## 2018-12-12 ENCOUNTER — Ambulatory Visit: Payer: Medicare Other | Admitting: *Deleted

## 2018-12-26 ENCOUNTER — Telehealth: Payer: Self-pay

## 2018-12-26 NOTE — Telephone Encounter (Signed)
Pharmacist asking if patient is supposed to be on Atorvastatin, has not refill since 2018. j Please advsie.  Copied from [place] [phone]. Topic: General - Other >> Dec 26, 2018 12:16 PM Rainey Pines A wrote: [facility] is asking for a callback in regards to patient having allergy or reaction on file to the atorvastatin Best contact [phone]

## 2019-01-23 ENCOUNTER — Ambulatory Visit: Payer: Medicare Other | Admitting: *Deleted

## 2019-02-12 ENCOUNTER — Ambulatory Visit: Payer: Medicare Other | Admitting: Skilled Nursing Facility1

## 2019-07-31 ENCOUNTER — Telehealth: Payer: Self-pay | Admitting: Family Medicine

## 2019-07-31 NOTE — Telephone Encounter (Signed)
Left message for patient to call back and schedule Medicare Annual Wellness Visit (AWV) with Nurse Health Advisor   This should be a 36 MINUTE VISIT.  Last AWV 07/20/18

## 2020-09-29 ENCOUNTER — Encounter: Payer: Self-pay | Admitting: Gastroenterology

## 2020-12-01 ENCOUNTER — Telehealth: Payer: Self-pay | Admitting: Medical

## 2020-12-01 ENCOUNTER — Telehealth: Payer: Self-pay | Admitting: Family Medicine

## 2020-12-01 DIAGNOSIS — E785 Hyperlipidemia, unspecified: Secondary | ICD-10-CM

## 2020-12-01 DIAGNOSIS — I10 Essential (primary) hypertension: Secondary | ICD-10-CM

## 2020-12-01 DIAGNOSIS — D696 Thrombocytopenia, unspecified: Secondary | ICD-10-CM

## 2020-12-01 DIAGNOSIS — E1169 Type 2 diabetes mellitus with other specified complication: Secondary | ICD-10-CM

## 2020-12-01 NOTE — Telephone Encounter (Signed)
Pt has a cpe scheduled with Mackie Pai on 12/5 @ 10:40. Pt is requesting labs a week prior, no active labs. Please advise.

## 2020-12-01 NOTE — Telephone Encounter (Signed)
Future lab placed for next appointment.

## 2020-12-01 NOTE — Telephone Encounter (Signed)
Lab appointment sent

## 2020-12-09 ENCOUNTER — Other Ambulatory Visit (INDEPENDENT_AMBULATORY_CARE_PROVIDER_SITE_OTHER): Payer: Medicare Other

## 2020-12-09 DIAGNOSIS — E669 Obesity, unspecified: Secondary | ICD-10-CM | POA: Diagnosis not present

## 2020-12-09 DIAGNOSIS — E1169 Type 2 diabetes mellitus with other specified complication: Secondary | ICD-10-CM | POA: Diagnosis not present

## 2020-12-09 DIAGNOSIS — E785 Hyperlipidemia, unspecified: Secondary | ICD-10-CM | POA: Diagnosis not present

## 2020-12-09 DIAGNOSIS — D696 Thrombocytopenia, unspecified: Secondary | ICD-10-CM

## 2020-12-09 DIAGNOSIS — I10 Essential (primary) hypertension: Secondary | ICD-10-CM | POA: Diagnosis not present

## 2020-12-09 LAB — LIPID PANEL
Cholesterol: 210 mg/dL — ABNORMAL HIGH (ref 0–200)
HDL: 34.2 mg/dL — ABNORMAL LOW (ref >39.00)
NonHDL: 175.75
Total CHOL/HDL Ratio: 6
Triglycerides: 282 mg/dL — ABNORMAL HIGH (ref 0.0–149.0)
VLDL: 56.4 mg/dL — ABNORMAL HIGH (ref 0.0–40.0)

## 2020-12-09 LAB — COMPREHENSIVE METABOLIC PANEL
ALT: 27 U/L (ref 0–53)
AST: 30 U/L (ref 0–37)
Albumin: 4 g/dL (ref 3.5–5.2)
Alkaline Phosphatase: 109 U/L (ref 39–117)
BUN: 42 mg/dL — ABNORMAL HIGH (ref 6–23)
CO2: 24 mEq/L (ref 19–32)
Calcium: 10 mg/dL (ref 8.4–10.5)
Chloride: 102 mEq/L (ref 96–112)
Creatinine, Ser: 1.45 mg/dL (ref 0.40–1.50)
GFR: 48.48 mL/min — ABNORMAL LOW (ref >60.00)
Glucose, Bld: 153 mg/dL — ABNORMAL HIGH (ref 70–99)
Potassium: 3.5 mEq/L (ref 3.5–5.1)
Sodium: 138 mEq/L (ref 135–145)
Total Bilirubin: 1.7 mg/dL — ABNORMAL HIGH (ref 0.2–1.2)
Total Protein: 7.3 g/dL (ref 6.0–8.3)

## 2020-12-09 LAB — CBC WITH DIFFERENTIAL/PLATELET
Basophils Absolute: 0.1 10*3/uL (ref 0.0–0.1)
Basophils Relative: 1.1 % (ref 0.0–3.0)
Eosinophils Absolute: 0.5 10*3/uL (ref 0.0–0.7)
Eosinophils Relative: 6.6 % — ABNORMAL HIGH (ref 0.0–5.0)
HCT: 49 % (ref 39.0–52.0)
Hemoglobin: 17.4 g/dL — ABNORMAL HIGH (ref 13.0–17.0)
Lymphocytes Relative: 27.8 % (ref 12.0–46.0)
Lymphs Abs: 2.1 10*3/uL (ref 0.7–4.0)
MCHC: 35.5 g/dL (ref 30.0–36.0)
MCV: 96.4 fl (ref 78.0–100.0)
Monocytes Absolute: 0.8 10*3/uL (ref 0.1–1.0)
Monocytes Relative: 10 % (ref 3.0–12.0)
Neutro Abs: 4.1 10*3/uL (ref 1.4–7.7)
Neutrophils Relative %: 54.5 % (ref 43.0–77.0)
Platelets: 114 10*3/uL — ABNORMAL LOW (ref 150.0–400.0)
RBC: 5.08 Mil/uL (ref 4.22–5.81)
RDW: 13.8 % (ref 11.5–15.5)
WBC: 7.5 10*3/uL (ref 4.0–10.5)

## 2020-12-09 LAB — LDL CHOLESTEROL, DIRECT: Direct LDL: 110 mg/dL

## 2020-12-09 LAB — HEMOGLOBIN A1C: Hgb A1c MFr Bld: 7.5 % — ABNORMAL HIGH (ref 4.6–6.5)

## 2020-12-13 NOTE — Progress Notes (Signed)
   Subjective:    Patient ID: David Esparza, male    DOB: 1950-11-18, 70 y.o.   MRN: [id number]  HPI Appears precharted and then appt was canceled. No labs or rx that I see done.  Pt in for check up on chronic conditions. Pt pcp is Dr]. Abner Greenspan. On review last visit  with pcp was in 2020  Labs done before visit per pt request.  Htn- on losartan/hctz 100-25 daily and  amlodipine 5 mg daily.  Diabetes- on metformin 1000 mg bid. Last a1c just recently 7.5. Much better than prior .  Hyperlipidemia. On atorvastatin 10 mg daily. Recent lipid panel still shows elevated lipids.  Anxiety noted in chart. In 2020 he was on xanax.  On review also noted vit D low in the past.        Review of Systems  Constitutional:  Negative for appetite change, chills, diaphoresis, fatigue and fever.  HENT:  Negative for congestion, ear discharge, ear pain, postnasal drip, rhinorrhea, sinus pressure and sinus pain.   Respiratory:  Negative for cough, shortness of breath and wheezing.   Cardiovascular:  Negative for chest pain and palpitations.  Gastrointestinal:  Negative for abdominal distention, abdominal pain, constipation, diarrhea and vomiting.  Endocrine: Negative for polydipsia, polyphagia and polyuria.  Genitourinary:  Negative for dysuria and frequency.  Musculoskeletal:  Negative for back pain.  Neurological:  Negative for dizziness, seizures, syncope, weakness, numbness and headaches.  Hematological:  Negative for adenopathy. Does not bruise/bleed easily.  Psychiatric/Behavioral:  Negative for behavioral problems and decreased concentration.       Objective:   Physical Exam  General Mental Status- Alert. General Appearance- Not in acute distress.   Skin General: Color- Normal Color. Moisture- Normal Moisture.  Neck Carotid Arteries- Normal color. Moisture- Normal Moisture. No carotid bruits. No JVD.  Chest and Lung Exam Auscultation: Breath  Sounds:-Normal.  Cardiovascular Auscultation:Rythm- Regular. Murmurs & Other Heart Sounds:Auscultation of the heart reveals- No Murmurs.  Abdomen Inspection:-Inspeection Normal. Palpation/Percussion:Note:No mass. Palpation and Percussion of the abdomen reveal- Non Tender, Non Distended + BS, no rebound or guarding.    Neurologic Cranial Nerve exam:- CN III-XII intact(No nystagmus), symmetric smile. Drift Test:- No drift. Romberg Exam:- Negative.  Heal to Toe Gait exam:-Normal. Finger to Nose:- Normal/Intact Strength:- 5/5 equal and symmetric strength both upper and lower extremities.       Assessment & Plan:

## 2020-12-14 ENCOUNTER — Encounter: Payer: Medicare Other | Admitting: Medical

## 2020-12-26 LAB — HM DIABETES EYE EXAM

## 2021-01-26 ENCOUNTER — Telehealth: Payer: Self-pay | Admitting: Family

## 2021-01-26 ENCOUNTER — Ambulatory Visit (INDEPENDENT_AMBULATORY_CARE_PROVIDER_SITE_OTHER): Payer: Medicare Other | Admitting: Family

## 2021-01-26 VITALS — BP 101/69 | HR 102 | Temp 98.6°F | Resp 16 | Ht 67.0 in | Wt 255.0 lb

## 2021-01-26 DIAGNOSIS — E1169 Type 2 diabetes mellitus with other specified complication: Secondary | ICD-10-CM

## 2021-01-26 DIAGNOSIS — R350 Frequency of micturition: Secondary | ICD-10-CM

## 2021-01-26 DIAGNOSIS — I10 Essential (primary) hypertension: Secondary | ICD-10-CM

## 2021-01-26 DIAGNOSIS — N401 Enlarged prostate with lower urinary tract symptoms: Secondary | ICD-10-CM | POA: Diagnosis not present

## 2021-01-26 DIAGNOSIS — E785 Hyperlipidemia, unspecified: Secondary | ICD-10-CM | POA: Diagnosis not present

## 2021-01-26 DIAGNOSIS — L02416 Cutaneous abscess of left lower limb: Secondary | ICD-10-CM | POA: Insufficient documentation

## 2021-01-26 DIAGNOSIS — R197 Diarrhea, unspecified: Secondary | ICD-10-CM | POA: Diagnosis not present

## 2021-01-26 DIAGNOSIS — E669 Obesity, unspecified: Secondary | ICD-10-CM | POA: Diagnosis not present

## 2021-01-26 DIAGNOSIS — H9190 Unspecified hearing loss, unspecified ear: Secondary | ICD-10-CM | POA: Diagnosis not present

## 2021-01-26 DIAGNOSIS — Z0000 Encounter for general adult medical examination without abnormal findings: Secondary | ICD-10-CM | POA: Diagnosis not present

## 2021-01-26 DIAGNOSIS — Z1211 Encounter for screening for malignant neoplasm of colon: Secondary | ICD-10-CM

## 2021-01-26 MED ORDER — CEPHALEXIN 500 MG PO CAPS: 500.0000 mg | ORAL_CAPSULE | Freq: Three times a day (TID) | ORAL | 0 refills | Status: DC

## 2021-01-26 MED ORDER — TAMSULOSIN HCL 0.4 MG PO CAPS: 0.4000 mg | ORAL_CAPSULE | Freq: Every day | ORAL | 3 refills | Status: DC

## 2021-01-26 MED ORDER — ATORVASTATIN CALCIUM 10 MG PO TABS: 10.0000 mg | ORAL_TABLET | Freq: Every day | ORAL | 1 refills | Status: DC

## 2021-01-26 MED ORDER — AMLODIPINE BESYLATE 5 MG PO TABS: 5.0000 mg | ORAL_TABLET | Freq: Every day | ORAL | 1 refills | Status: DC

## 2021-01-26 MED ORDER — LOSARTAN POTASSIUM-HCTZ 100-25 MG PO TABS: 1.0000 | ORAL_TABLET | Freq: Every day | ORAL | 1 refills | Status: DC

## 2021-01-26 NOTE — Assessment & Plan Note (Signed)
New. Mild, will rx with keflex.

## 2021-01-26 NOTE — Assessment & Plan Note (Signed)
Overtreated. Turns out that the patient was taking an old hctz 25mg  in addition to his hyzaar. Advised pt to stop the plain hctz table. Continue current dose of hyzaar and amlodipine.

## 2021-01-26 NOTE — Assessment & Plan Note (Signed)
Recommended that he have hearing test and hearing aid evaluation at the [place].

## 2021-01-26 NOTE — Progress Notes (Signed)
Subjective:    David Esparza is a 71 y.o. male who presents for Medicare Annual/Subsequent preventive examination.   Preventive Screening-Counseling & Management  Tobacco Social History   Tobacco Use  Smoking Status Former   Types: Cigarettes   Quit date: 08/30/1968   Years since quitting: 52.4  Smokeless Tobacco Never  Tobacco Comments   smoked in high school    Problems Prior to Visit  1. Anxiety-   2. HTN-  BP Readings from Last 3 Encounters:  01/26/21 101/69  11/01/18 136/88  03/06/18 (!) 142/84   3.  DM2- maintained on metformin 1000mg  bid. Not checking sugars.   Lab Results  Component Value Date   HGBA1C 7.5 (H) 12/09/2020   HGBA1C 11.3 (H) 10/29/2018   HGBA1C 9.7 (H) 07/26/2018    Lab Results  Component Value Date   MICROALBUR 1.55 02/21/2012   LDLCALC 58 07/26/2018   CREATININE 1.45 12/09/2020   Vit D deficiency-  not on supplement.   Insomnia- has not used xanax in several years.  Gout no recent flares. No longer taking allopurinol but has colchicine on hand.   Low testosterone- no longer on supplements.   ED- maintained on viagra prn.   Wt Readings from Last 3 Encounters:  01/26/21 255 lb (115.7 kg)  11/01/18 252 lb (114.3 kg)  03/06/18 267 lb 6.4 oz (121.3 kg)   Lab Results  Component Value Date   PSA 0.60 01/08/2018   PSA 0.58 07/18/2017   PSA 1.31 07/05/2016    BP Readings from Last 3 Encounters:  01/26/21 101/69  11/01/18 136/88  03/06/18 (!) 142/84    Current Problems (verified) Patient Active Problem List   Diagnosis Date Noted   Benign prostatic hyperplasia with urinary frequency 01/26/2021   Diarrhea 01/26/2021   Abscess of left leg 01/26/2021   Sun-damaged skin 03/07/2018   Hyperlipidemia associated with type 2 diabetes mellitus ([place]) 03/07/2018   Abnormality of thoracic aorta 03/07/2018   Fatty liver 01/11/2018   Cutaneous skin tags 07/27/2016   H/O Bell's palsy 07/06/2016   SOBOE (shortness of breath on exertion)  11/10/2014   Erectile dysfunction 04/11/2014   Knee pain, left 03/24/2013   Change in hearing 02/05/2013   Low testosterone 02/28/2012   Vitamin D deficiency 02/28/2012   Knee pain, acute 02/28/2012   Encounter for Medicare annual wellness exam 09/06/2011   Sleep apnea 09/06/2011   Anxiety 09/06/2011   Type 2 diabetes mellitus with obesity ([place]) 09/06/2011   Abnormal LFTs 09/06/2011   Nocturia 08/16/2010   Insomnia 08/16/2010   Hypertension    Obese    Fatigue    Measles    Mumps     Medications Prior to Visit Current Outpatient Medications on File Prior to Visit  Medication Sig Dispense Refill   Lancets MISC Check BS daily and PRN. Dx: E11.9 Dispense based on patient and insurance preference 100 each 0   metFORMIN (GLUCOPHAGE) 1000 MG tablet Take 1 tablet (1,000 mg total) by mouth 2 (two) times daily with a meal. 180 tablet 1   sildenafil (VIAGRA) 100 MG tablet Take 0.5-1 tablets (50-100 mg total) by mouth daily as needed for erectile dysfunction. 30 tablet 2   No current facility-administered medications on file prior to visit.    Current Medications (verified) Current Outpatient Medications  Medication Sig Dispense Refill   cephALEXin (KEFLEX) 500 MG capsule Take 1 capsule (500 mg total) by mouth 3 (three) times daily. 21 capsule 0   Lancets MISC Check BS  daily and PRN. Dx: E11.9 Dispense based on patient and insurance preference 100 each 0   metFORMIN (GLUCOPHAGE) 1000 MG tablet Take 1 tablet (1,000 mg total) by mouth 2 (two) times daily with a meal. 180 tablet 1   sildenafil (VIAGRA) 100 MG tablet Take 0.5-1 tablets (50-100 mg total) by mouth daily as needed for erectile dysfunction. 30 tablet 2   tamsulosin (FLOMAX) 0.4 MG CAPS capsule Take 1 capsule (0.4 mg total) by mouth daily. 30 capsule 3   amLODipine (NORVASC) 5 MG tablet Take 1 tablet (5 mg total) by mouth daily. 90 tablet 1   atorvastatin (LIPITOR) 10 MG tablet Take 1 tablet (10 mg total) by mouth at bedtime. 90  tablet 1   losartan-hydrochlorothiazide (HYZAAR) 100-25 MG tablet Take 1 tablet by mouth daily. for blood pressure 90 tablet 1   No current facility-administered medications for this visit.     Allergies (verified) Patient has no known allergies.   PAST HISTORY  Family History Family History  Problem Relation Age of Onset   Diabetes Mother        type 2   Cancer Maternal Grandmother        throat   Heart disease Maternal Grandfather    Diabetes Maternal Grandfather    Depression Daughter        anxiety   Graves' disease Daughter    Colon polyps Neg Hx     Social History Social History   Tobacco Use   Smoking status: Former    Types: Cigarettes    Quit date: 08/30/1968    Years since quitting: 52.4   Smokeless tobacco: Never   Tobacco comments:    smoked in high school  Substance Use Topics   Alcohol use: No    Comment: very little    Are there smokers in your home (other than you)?  No  Risk Factors Current exercise habits: The patient does not participate in regular exercise at present.  Dietary issues discussed: Married, shares cooking with his wife.  He feels that his diet could be better  Cardiac risk factors: advanced age (older than 64 for men, 70 for women), diabetes mellitus, dyslipidemia, hypertension, male gender, obesity (BMI >= 30 kg/m2), and sedentary lifestyle.  Depression Screen (Note: if answer to either of the following is "Yes", a more complete depression screening is indicated)   Q1: Over the past two weeks, have you felt down, depressed or hopeless? No  Q2: Over the past two weeks, have you felt little interest or pleasure in doing things? No  Have you lost interest or pleasure in daily life? Yes  Do you often feel hopeless? No  Do you cry easily over simple problems? No  Activities of Daily Living In your present state of health, do you have any difficulty performing the following activities?:  Driving? No Managing money?  No Feeding  yourself? No Getting from bed to chair? No Climbing a flight of stairs? No Preparing food and eating?: No Bathing or showering? No Getting dressed: No Getting to the toilet? No Using the toilet:No Moving around from place to place: No In the past year have you fallen or had a near fall?:No   Are you sexually active?  Yes  Do you have more than one partner?  No  Hearing Difficulties: Yes Do you often ask people to speak up or repeat themselves? Yes Do you experience ringing or noises in your ears? No Do you have difficulty understanding soft or whispered voices?  Yes   Do you feel that you have a problem with memory? No  Do you often misplace items? No  Do you feel safe at home?  Yes  Cognitive Testing  Alert? Yes  Normal Appearance?Yes  Oriented to person? Yes  Place? Yes   Time? Yes  Recall of three objects?  Yes  Can perform simple calculations? Yes  Displays appropriate judgment?Yes  Can read the correct time from a watch face?Yes   Advanced Directives have been discussed with the patient? Yes   List the Names of Other Physician/Practitioners you currently use: 1.  none  Indicate any recent Medical Services you may have received from other than [facility] providers in the past year (date may be approximate).   There is no immunization history on file for this patient.  Screening Tests Health Maintenance  Topic Date Due   COVID-19 Vaccine (1) Never done   Pneumonia Vaccine 21+ Years old (1 - PCV) Never done   FOOT EXAM  Never done   TETANUS/TDAP  Never done   Zoster Vaccines- Shingrix (1 of 2) Never done   OPHTHALMOLOGY EXAM  09/11/2014   COLONOSCOPY (Pts 45-29yrs Insurance coverage will need to be confirmed)  09/15/2020   INFLUENZA VACCINE  04/09/2021 (Originally 08/10/2020)   HEMOGLOBIN A1C  06/08/2021   Hepatitis C Screening  Completed   HPV VACCINES  Aged Out    All answers were reviewed with the patient and necessary referrals were made:  Nance Pear, NP   01/26/2021   History reviewed: allergies, current medications, past family history, past medical history, past social history, past surgical history, and problem list  Review of Systems Pertinent items are noted in HPI.    Objective:     Vision Screening   Right eye Left eye Both eyes  Without correction 20/25 20/20 20/20   With correction       Blood pressure 101/69, pulse (!) 102, temperature 98.6 F (37 C), temperature source Oral, resp. rate 16, height 5\' 7"  (1.702 m), weight 255 lb (115.7 kg), SpO2 97 %. Body mass index is 39.94 kg/m.  BP 101/69 (BP Location: Right Arm, Patient Position: Sitting, Cuff Size: Large)    Pulse (!) 102    Temp 98.6 F (37 C) (Oral)    Resp 16    Ht 5\' 7"  (1.702 m)    Wt 255 lb (115.7 kg)    SpO2 97%    BMI 39.94 kg/m   Physical Exam  Constitutional: He is oriented to person, place, and time. He appears well-developed and well-nourished. No distress.  HENT:  Mouth/Throat: Not examined- pt wearing mask Eyes: Pupils are equal, round. No scleral icterus.  Neck: Normal range of motion. No thyromegaly present.  Cardiovascular: Normal rate and regular rhythm.   No murmur heard. Pulmonary/Chest: Effort normal and breath sounds normal. No respiratory distress. He has no wheezes. He has no rales. He exhibits no tenderness.  Abdominal: Soft. Bowel sounds are normal. He exhibits no distension and no mass. There is no tenderness. There is no rebound and no guarding.  Musculoskeletal: He exhibits no edema. Neurological: He is alert and oriented to person, place, and time.  He exhibits normal muscle tone. Coordination normal. Hard of hearing Skin: Skin is warm and dry. Several scarred lesions left upper shin medially. One small draining cyst noted.   Psychiatric: He has a normal mood and affect. His behavior is normal. Judgment and thought content normal.  Assessment & Plan:        Assessment:      Plan:     During the  course of the visit the patient was educated and counseled about appropriate screening and preventive services including:   Pneumococcal vaccine  Influenza vaccine Td vaccine Prostate cancer screening Colorectal cancer screening Diabetes screening Nutrition counseling  Advanced directives: does not have advanced directive. Pt given handout information/forms.  Diet review for nutrition referral? Yes ____  Not Indicated _x___   Patient Instructions (the written plan) was given to the patient.  Medicare Attestation I have personally reviewed: The patient's medical and social history Their use of alcohol, tobacco or illicit drugs Their current medications and supplements The patient's functional ability including ADLs,fall risks, home safety risks, cognitive, and hearing and visual impairment Diet and physical activities Evidence for depression or mood disorders  The patient's weight, height, BMI, and visual acuity have been recorded in the chart.  I have made referrals, counseling, and provided education to the patient based on review of the above and I have provided the patient with a written personalized care plan for preventive services.     Nance Pear, NP   01/26/2021

## 2021-01-26 NOTE — Assessment & Plan Note (Addendum)
Long standing issue.  We discussed that metformin could be playing a role but he reports diarrhea was present prior to metformin use.  Seems to equate with certain foods. Recommended avoiding trigger foods, prn imodium, if symptoms worsen or fail to improve consider changing metformin and/or referral to GI.

## 2021-01-26 NOTE — Telephone Encounter (Signed)
Records release form faxed to [employer] at [facility]

## 2021-01-26 NOTE — Telephone Encounter (Signed)
Please request eye exam from [facility] on [address].

## 2021-01-26 NOTE — Assessment & Plan Note (Signed)
Check PSA. Notes frequent nocturia/occasional urinary incontinence due to urgency. Trial of flomax.

## 2021-01-26 NOTE — Progress Notes (Deleted)
Subjective:     Patient ID: David Esparza, male    DOB: [birth year], 71 y.o.   MRN: [id number]  Chief Complaint  Patient presents with   Annual Exam    HPI Patient is in today for ***  Health Maintenance Due  Topic Date Due   COVID-19 Vaccine (1) Never done   Pneumonia Vaccine 58+ Years old (1 - PCV) Never done   FOOT EXAM  Never done   TETANUS/TDAP  Never done   Zoster Vaccines- Shingrix (1 of 2) Never done   OPHTHALMOLOGY EXAM  09/11/2014   INFLUENZA VACCINE  Never done   COLONOSCOPY (Pts 45-46yr Insurance coverage will need to be confirmed)  09/15/2020    Past Medical History:  Diagnosis Date   Abnormal LFTs 09/06/2011   Acute bronchitis 08/16/2010   Anxiety 09/06/2011   Chicken pox as a child   Cutaneous skin tags 07/27/2016   Fatigue    Gout    Gout    Grief reaction 09/09/2012   Hyperglycemia 09/06/2011   Hyperlipidemia 09/06/2011   Hypertension    Insomnia 08/16/2010   Knee pain, acute 02/28/2012   Low testosterone 02/28/2012   Measles as a child   Mumps as a child   Nocturia 08/16/2010   Obese    Preventative health care 09/06/2011   Sleep apnea 09/06/2011   Unspecified vitamin D deficiency 02/28/2012    Past Surgical History:  Procedure Laterality Date   BACK SURGERY     COLONOSCOPY  09/16/10   Dr. KDeatra Ina moderate diverticulosis sigmoid and descending colon, o/w normal: repeat 10 yrs.   intestines sewn  71yrs old   shot once made 6 holes   TONSILLECTOMY  as a child   VASECTOMY  89yrs old    Family History  Problem Relation Age of Onset   Diabetes Mother        type 2   Cancer Maternal Grandmother        throat   Heart disease Maternal Grandfather    Diabetes Maternal Grandfather    Depression Daughter        anxiety   Graves' disease Daughter    Colon polyps Neg Hx     Social History   Socioeconomic History   Marital status: Married    Spouse name: Not on file   Number of children: Not on file   Years of education: Not on file   Highest  education level: Not on file  Occupational History   Not on file  Tobacco Use   Smoking status: Former    Types: Cigarettes    Quit date: 08/30/1968    Years since quitting: 52.4   Smokeless tobacco: Never   Tobacco comments:    smoked in high school  Substance and Sexual Activity   Alcohol use: No    Comment: very little   Drug use: No   Sexual activity: Yes    Partners: Female  Other Topics Concern   Not on file  Social History Narrative   Not on file   Social Determinants of Health   Financial Resource Strain: Not on file  Food Insecurity: Not on file  Transportation Needs: Not on file  Physical Activity: Not on file  Stress: Not on file  Social Connections: Not on file  Intimate Partner Violence: Not on file    Outpatient Medications Prior to Visit  Medication Sig Dispense Refill   ALPRAZolam (XANAX) 1 MG tablet TAKE 1 TABLET BY MOUTH  2 TIMES DAILY AS NEEDED FOR SLEEP. 30 tablet 1   amLODipine (NORVASC) 5 MG tablet TAKE 1 TABLET BY MOUTH DAILY. 90 tablet 1   Lancets MISC Check BS daily and PRN. Dx: E11.9 Dispense based on patient and insurance preference 100 each 0   losartan-hydrochlorothiazide (HYZAAR) 100-25 MG tablet TAKE 1 TABLET BY MOUTH DAILY. FOR BLOOD PRESSURE 90 tablet 1   metFORMIN (GLUCOPHAGE) 1000 MG tablet Take 1 tablet (1,000 mg total) by mouth 2 (two) times daily with a meal. 180 tablet 1   sildenafil (VIAGRA) 100 MG tablet Take 0.5-1 tablets (50-100 mg total) by mouth daily as needed for erectile dysfunction. 30 tablet 2   allopurinol (ZYLOPRIM) 300 MG tablet Take 1 tablet (300 mg total) by mouth daily. 90 tablet 1   atorvastatin (LIPITOR) 10 MG tablet Take 1 tablet (10 mg total) by mouth at bedtime. 30 tablet 5   b complex vitamins capsule Take 1 capsule by mouth every other day.     blood glucose meter kit and supplies KIT Dispense based on patient and insurance preference. Check BC daily and PRN Dx;E11.9 1 each 0   COLCRYS 0.6 MG tablet TAKE 1  TABLET BY MOUTH DAILY FOR GOUT FLARE-UP (Patient not taking: Reported on 07/20/2018) 30 tablet 3   fish oil-omega-3 fatty acids 1000 MG capsule Take 2 g by mouth daily as needed.      fluocinolone (VANOS) 0.01 % cream Apply topically 2 (two) times daily. 30 g 1   multivitamin (THERAGRAN) tablet Take 1 tablet by mouth daily.       Vitamin D, Ergocalciferol, (DRISDOL) 1.25 MG (50000 UT) CAPS capsule Take 1 capsule (50,000 Units total) by mouth every 7 (seven) days. 4 capsule 4   No facility-administered medications prior to visit.    No Known Allergies  ROS     Objective:    Physical Exam  There were no vitals taken for this visit. Wt Readings from Last 3 Encounters:  11/01/18 252 lb (114.3 kg)  03/06/18 267 lb 6.4 oz (121.3 kg)  01/08/18 270 lb (122.5 kg)       Assessment & Plan:   Problem List Items Addressed This Visit   None   I have discontinued Shailen L. Guiney "Sam"'s multivitamin, fish oil-omega-3 fatty acids, b complex vitamins, atorvastatin, fluocinolone, Colcrys, allopurinol, Vitamin D (Ergocalciferol), and blood glucose meter kit and supplies. I am also having him maintain his sildenafil, amLODipine, ALPRAZolam, Lancets, losartan-hydrochlorothiazide, and metFORMIN.  No orders of the defined types were placed in this encounter.

## 2021-01-26 NOTE — Progress Notes (Deleted)
Subjective:    David Esparza is a 71 y.o. male who presents for Medicare Annual/Subsequent preventive examination.   Preventive Screening-Counseling & Management  Tobacco Social History   Tobacco Use  Smoking Status Former   Types: Cigarettes   Quit date: 08/30/1968   Years since quitting: 52.4  Smokeless Tobacco Never  Tobacco Comments   smoked in high school    Problems Prior to Visit  1. Anxiety-   2. HTN-  BP Readings from Last 3 Encounters:  01/26/21 101/69  11/01/18 136/88  03/06/18 (!) 142/84   3.  DM2- maintained on metformin 1000mg  bid. Not checking sugars.   Lab Results  Component Value Date   HGBA1C 7.5 (H) 12/09/2020   HGBA1C 11.3 (H) 10/29/2018   HGBA1C 9.7 (H) 07/26/2018    Lab Results  Component Value Date   MICROALBUR 1.55 02/21/2012   LDLCALC 58 07/26/2018   CREATININE 1.45 12/09/2020   Vit D deficiency-  not on supplement.   Insomnia- has not used xanax in several years.  Gout no recent flares. No longer taking allopurinol but has colchicine on hand.   Low testosterone- no longer on supplements.   ED- maintained on viagra prn.   Wt Readings from Last 3 Encounters:  01/26/21 255 lb (115.7 kg)  11/01/18 252 lb (114.3 kg)  03/06/18 267 lb 6.4 oz (121.3 kg)   Lab Results  Component Value Date   PSA 0.60 01/08/2018   PSA 0.58 07/18/2017   PSA 1.31 07/05/2016    BP Readings from Last 3 Encounters:  01/26/21 101/69  11/01/18 136/88  03/06/18 (!) 142/84    Current Problems (verified) Patient Active Problem List   Diagnosis Date Noted   Benign prostatic hyperplasia with urinary frequency 01/26/2021   Diarrhea 01/26/2021   Abscess of left leg 01/26/2021   Sun-damaged skin 03/07/2018   Hyperlipidemia associated with type 2 diabetes mellitus ([place]) 03/07/2018   Abnormality of thoracic aorta 03/07/2018   Fatty liver 01/11/2018   Cutaneous skin tags 07/27/2016   H/O Bell's palsy 07/06/2016   SOBOE (shortness of breath on exertion)  11/10/2014   Erectile dysfunction 04/11/2014   Knee pain, left 03/24/2013   Change in hearing 02/05/2013   Low testosterone 02/28/2012   Vitamin D deficiency 02/28/2012   Knee pain, acute 02/28/2012   Encounter for Medicare annual wellness exam 09/06/2011   Sleep apnea 09/06/2011   Anxiety 09/06/2011   Type 2 diabetes mellitus with obesity ([place]) 09/06/2011   Abnormal LFTs 09/06/2011   Nocturia 08/16/2010   Insomnia 08/16/2010   Hypertension    Obese    Fatigue    Measles    Mumps     Medications Prior to Visit Current Outpatient Medications on File Prior to Visit  Medication Sig Dispense Refill   Lancets MISC Check BS daily and PRN. Dx: E11.9 Dispense based on patient and insurance preference 100 each 0   metFORMIN (GLUCOPHAGE) 1000 MG tablet Take 1 tablet (1,000 mg total) by mouth 2 (two) times daily with a meal. 180 tablet 1   sildenafil (VIAGRA) 100 MG tablet Take 0.5-1 tablets (50-100 mg total) by mouth daily as needed for erectile dysfunction. 30 tablet 2   No current facility-administered medications on file prior to visit.    Current Medications (verified) Current Outpatient Medications  Medication Sig Dispense Refill   cephALEXin (KEFLEX) 500 MG capsule Take 1 capsule (500 mg total) by mouth 3 (three) times daily. 21 capsule 0   Lancets MISC Check BS  daily and PRN. Dx: E11.9 Dispense based on patient and insurance preference 100 each 0   metFORMIN (GLUCOPHAGE) 1000 MG tablet Take 1 tablet (1,000 mg total) by mouth 2 (two) times daily with a meal. 180 tablet 1   sildenafil (VIAGRA) 100 MG tablet Take 0.5-1 tablets (50-100 mg total) by mouth daily as needed for erectile dysfunction. 30 tablet 2   tamsulosin (FLOMAX) 0.4 MG CAPS capsule Take 1 capsule (0.4 mg total) by mouth daily. 30 capsule 3   amLODipine (NORVASC) 5 MG tablet Take 1 tablet (5 mg total) by mouth daily. 90 tablet 1   atorvastatin (LIPITOR) 10 MG tablet Take 1 tablet (10 mg total) by mouth at bedtime. 90  tablet 1   losartan-hydrochlorothiazide (HYZAAR) 100-25 MG tablet Take 1 tablet by mouth daily. for blood pressure 90 tablet 1   No current facility-administered medications for this visit.     Allergies (verified) Patient has no known allergies.   PAST HISTORY  Family History Family History  Problem Relation Age of Onset   Diabetes Mother        type 2   Cancer Maternal Grandmother        throat   Heart disease Maternal Grandfather    Diabetes Maternal Grandfather    Depression Daughter        anxiety   Graves' disease Daughter    Colon polyps Neg Hx     Social History Social History   Tobacco Use   Smoking status: Former    Types: Cigarettes    Quit date: 08/30/1968    Years since quitting: 52.4   Smokeless tobacco: Never   Tobacco comments:    smoked in high school  Substance Use Topics   Alcohol use: No    Comment: very little    Are there smokers in your home (other than you)?  No  Risk Factors Current exercise habits: The patient does not participate in regular exercise at present.  Dietary issues discussed: Married, shares cooking with his wife.  He feels that his diet could be better  Cardiac risk factors: advanced age (older than 64 for men, 28 for women), diabetes mellitus, dyslipidemia, hypertension, male gender, obesity (BMI >= 30 kg/m2), and sedentary lifestyle.  Depression Screen (Note: if answer to either of the following is "Yes", a more complete depression screening is indicated)   Q1: Over the past two weeks, have you felt down, depressed or hopeless? No  Q2: Over the past two weeks, have you felt little interest or pleasure in doing things? No  Have you lost interest or pleasure in daily life? Yes  Do you often feel hopeless? No  Do you cry easily over simple problems? No  Activities of Daily Living In your present state of health, do you have any difficulty performing the following activities?:  Driving? No Managing money?  No Feeding  yourself? No Getting from bed to chair? No Climbing a flight of stairs? No Preparing food and eating?: No Bathing or showering? No Getting dressed: No Getting to the toilet? No Using the toilet:No Moving around from place to place: No In the past year have you fallen or had a near fall?:No   Are you sexually active?  {yes/no:20286}  Do you have more than one partner?  No  Hearing Difficulties: Yes Do you often ask people to speak up or repeat themselves? Yes Do you experience ringing or noises in your ears? No Do you have difficulty understanding soft or whispered voices?  Yes   Do you feel that you have a problem with memory? No  Do you often misplace items? No  Do you feel safe at home?  Yes  Cognitive Testing  Alert? Yes  Normal Appearance?Yes  Oriented to person? Yes  Place? Yes   Time? Yes  Recall of three objects?  Yes  Can perform simple calculations? Yes  Displays appropriate judgment?Yes  Can read the correct time from a watch face?Yes   Advanced Directives have been discussed with the patient? Yes   List the Names of Other Physician/Practitioners you currently use: 1.  none  Indicate any recent Medical Services you may have received from other than [facility] providers in the past year (date may be approximate).   There is no immunization history on file for this patient.  Screening Tests Health Maintenance  Topic Date Due   COVID-19 Vaccine (1) Never done   Pneumonia Vaccine 2+ Years old (1 - PCV) Never done   FOOT EXAM  Never done   TETANUS/TDAP  Never done   Zoster Vaccines- Shingrix (1 of 2) Never done   OPHTHALMOLOGY EXAM  09/11/2014   COLONOSCOPY (Pts 45-68yrs Insurance coverage will need to be confirmed)  09/15/2020   INFLUENZA VACCINE  04/09/2021 (Originally 08/10/2020)   HEMOGLOBIN A1C  06/08/2021   Hepatitis C Screening  Completed   HPV VACCINES  Aged Out    All answers were reviewed with the patient and necessary referrals were made:  Nance Pear, NP   01/26/2021   History reviewed: allergies, current medications, past family history, past medical history, past social history, past surgical history, and problem list  Review of Systems Pertinent items are noted in HPI.    Objective:     Vision Screening   Right eye Left eye Both eyes  Without correction 20/25 20/20 20/20   With correction       Blood pressure 101/69, pulse (!) 102, temperature 98.6 F (37 C), temperature source Oral, resp. rate 16, height 5\' 7"  (1.702 m), weight 255 lb (115.7 kg), SpO2 97 %. Body mass index is 39.94 kg/m.  BP 101/69 (BP Location: Right Arm, Patient Position: Sitting, Cuff Size: Large)    Pulse (!) 102    Temp 98.6 F (37 C) (Oral)    Resp 16    Ht 5\' 7"  (1.702 m)    Wt 255 lb (115.7 kg)    SpO2 97%    BMI 39.94 kg/m   Physical Exam  Constitutional: He is oriented to person, place, and time. He appears well-developed and well-nourished. No distress.  HENT:  Mouth/Throat: Not examined- pt wearing mask Eyes: Pupils are equal, round. No scleral icterus.  Neck: Normal range of motion. No thyromegaly present.  Cardiovascular: Normal rate and regular rhythm.   No murmur heard. Pulmonary/Chest: Effort normal and breath sounds normal. No respiratory distress. He has no wheezes. He has no rales. He exhibits no tenderness.  Abdominal: Soft. Bowel sounds are normal. He exhibits no distension and no mass. There is no tenderness. There is no rebound and no guarding.  Musculoskeletal: He exhibits no edema. Neurological: He is alert and oriented to person, place, and time.  He exhibits normal muscle tone. Coordination normal. Hard of hearing Skin: Skin is warm and dry. Several scarred lesions left upper shin medially. One small draining cyst noted.   Psychiatric: He has a normal mood and affect. His behavior is normal. Judgment and thought content normal.  Assessment & Plan:        Assessment:      Plan:     During  the course of the visit the patient was educated and counseled about appropriate screening and preventive services including:   Pneumococcal vaccine  Influenza vaccine Td vaccine Prostate cancer screening Colorectal cancer screening Diabetes screening Nutrition counseling  Advanced directives: does not have advanced directive. Pt given handout information/forms.  Diet review for nutrition referral? Yes ____  Not Indicated _x___   Patient Instructions (the written plan) was given to the patient.  Medicare Attestation I have personally reviewed: The patient's medical and social history Their use of alcohol, tobacco or illicit drugs Their current medications and supplements The patient's functional ability including ADLs,fall risks, home safety risks, cognitive, and hearing and visual impairment Diet and physical activities Evidence for depression or mood disorders  The patient's weight, height, BMI, and visual acuity have been recorded in the chart.  I have made referrals, counseling, and provided education to the patient based on review of the above and I have provided the patient with a written personalized care plan for preventive services.     Nance Pear, NP   01/26/2021

## 2021-01-26 NOTE — Assessment & Plan Note (Signed)
Refer for colo. Wishes to get his vaccines at the [facility].

## 2021-01-26 NOTE — Assessment & Plan Note (Signed)
Uncontrolled. Advised pt to check sugar at least once a day and work on diet, exercise and weight loss.

## 2021-01-26 NOTE — Assessment & Plan Note (Signed)
Not taking statin. Resume statin. Discussed that statin can reduce his risk of CV disease.

## 2021-01-26 NOTE — Patient Instructions (Addendum)
Please check your sugars once a day.  Please look into getting your shingles shot, tetanus shot and Pneumonia shot and hearing eval at the [facility].  Stop plain hctz. Start keflex for the sore on your left knee. Start flomax for urinary frequency. Start as needed imodium for diarrhea.

## 2021-01-27 LAB — PSA: PSA: 1.72 ng/mL (ref 0.10–4.00)

## 2021-02-02 ENCOUNTER — Encounter: Payer: Self-pay | Admitting: Gastroenterology

## 2021-03-02 ENCOUNTER — Ambulatory Visit (AMBULATORY_SURGERY_CENTER): Payer: Medicare Other

## 2021-03-02 ENCOUNTER — Other Ambulatory Visit: Payer: Self-pay

## 2021-03-02 VITALS — Ht 67.0 in | Wt 250.0 lb

## 2021-03-02 DIAGNOSIS — Z1211 Encounter for screening for malignant neoplasm of colon: Secondary | ICD-10-CM

## 2021-03-02 MED ORDER — PEG 3350-KCL-NA BICARB-NACL 420 G PO SOLR: 4000.0000 mL | Freq: Once | ORAL | 0 refills | Status: AC

## 2021-03-02 NOTE — Progress Notes (Signed)
°  Patient's pre-visit was done today over the phone with the patient   Name,DOB and address verified.    Patient denies any allergies to Eggs and Soy.   Patient denies any problems with anesthesia/sedation.  Patient denies taking diet pills or blood thinners.   Denies atrial flutter or atrial fib  Denies chronic constipation  No home Oxygen.   Packet of Prep instructions sent by My Chart or mail to patient including a copy of a consent form if by mail-pt is aware.   Patient understands to call us back with any questions or concerns.   Patient is aware of our care-partner policy and [id number] safety protocol.    Pt agreed to PV being done earlier than scheduled

## 2021-03-16 ENCOUNTER — Other Ambulatory Visit: Payer: Self-pay

## 2021-03-16 ENCOUNTER — Encounter: Payer: Self-pay | Admitting: Gastroenterology

## 2021-03-16 ENCOUNTER — Ambulatory Visit (AMBULATORY_SURGERY_CENTER): Payer: Medicare Other | Admitting: Gastroenterology

## 2021-03-16 VITALS — BP 104/62 | HR 77 | Temp 97.8°F | Resp 16 | Ht 67.0 in | Wt 250.0 lb

## 2021-03-16 DIAGNOSIS — D128 Benign neoplasm of rectum: Secondary | ICD-10-CM

## 2021-03-16 DIAGNOSIS — D122 Benign neoplasm of ascending colon: Secondary | ICD-10-CM | POA: Diagnosis not present

## 2021-03-16 DIAGNOSIS — K635 Polyp of colon: Secondary | ICD-10-CM | POA: Diagnosis not present

## 2021-03-16 DIAGNOSIS — D129 Benign neoplasm of anus and anal canal: Secondary | ICD-10-CM

## 2021-03-16 DIAGNOSIS — G4733 Obstructive sleep apnea (adult) (pediatric): Secondary | ICD-10-CM | POA: Diagnosis not present

## 2021-03-16 DIAGNOSIS — E119 Type 2 diabetes mellitus without complications: Secondary | ICD-10-CM | POA: Diagnosis not present

## 2021-03-16 DIAGNOSIS — D125 Benign neoplasm of sigmoid colon: Secondary | ICD-10-CM | POA: Diagnosis not present

## 2021-03-16 DIAGNOSIS — K641 Second degree hemorrhoids: Secondary | ICD-10-CM | POA: Diagnosis not present

## 2021-03-16 DIAGNOSIS — K621 Rectal polyp: Secondary | ICD-10-CM

## 2021-03-16 DIAGNOSIS — I10 Essential (primary) hypertension: Secondary | ICD-10-CM | POA: Diagnosis not present

## 2021-03-16 DIAGNOSIS — Z1211 Encounter for screening for malignant neoplasm of colon: Secondary | ICD-10-CM | POA: Diagnosis not present

## 2021-03-16 DIAGNOSIS — K5730 Diverticulosis of large intestine without perforation or abscess without bleeding: Secondary | ICD-10-CM | POA: Diagnosis not present

## 2021-03-16 MED ORDER — SODIUM CHLORIDE 0.9 % IV SOLN: 500.0000 mL | Freq: Once | INTRAVENOUS | Status: DC

## 2021-03-16 NOTE — Progress Notes (Signed)
Called to room to assist during endoscopic procedure.  Patient ID and intended procedure confirmed with present staff. Received instructions for my participation in the procedure from the performing physician.  

## 2021-03-16 NOTE — Progress Notes (Signed)
Report to PACU, RN, vss, BBS= Clear.  

## 2021-03-16 NOTE — Op Note (Signed)
[facility] ?Patient Name: David Esparza ?Procedure Date: 03/16/2021 8:11 AM ?MRN: [id number] ?Endoscopist: Gerrit Heck , MD ?Age: 71 ?Referring MD:  ?Date of Birth: April 28, 1950 ?Gender: Male ?Account #: [id number] ?Procedure:                Colonoscopy ?Indications:              Screening for colorectal malignant neoplasm (last  ?                          colonoscopy was 10 years ago) ?Medicines:                Monitored Anesthesia Care ?Procedure:                Pre-Anesthesia Assessment: ?                          - Prior to the procedure, a History and Physical  ?                          was performed, and patient medications and  ?                          allergies were reviewed. The patient's tolerance of  ?                          previous anesthesia was also reviewed. The risks  ?                          and benefits of the procedure and the sedation  ?                          options and risks were discussed with the patient.  ?                          All questions were answered, and informed consent  ?                          was obtained. Prior Anticoagulants: The patient has  ?                          taken no previous anticoagulant or antiplatelet  ?                          agents. ASA Grade Assessment: III - A patient with  ?                          severe systemic disease. After reviewing the risks  ?                          and benefits, the patient was deemed in  ?                          satisfactory condition to undergo the procedure. ?  After obtaining informed consent, the colonoscope  ?                          was passed under direct vision. Throughout the  ?                          procedure, the patient's blood pressure, pulse, and  ?                          oxygen saturations were monitored continuously. The  ?                          Olympus CF-HQ190L (Serial# [id number]) Colonoscope was  ?                          introduced through the anus and  advanced to the the  ?                          cecum, identified by appendiceal orifice and  ?                          ileocecal valve. The colonoscopy was performed  ?                          without difficulty. The patient tolerated the  ?                          procedure well. The quality of the bowel  ?                          preparation was fair. The ileocecal valve,  ?                          appendiceal orifice, and rectum were photographed. ?Scope In: 8:14:54 AM ?Scope Out: 8:37:24 AM ?Scope Withdrawal Time: 0 hours 17 minutes 36 seconds  ?Total Procedure Duration: 0 hours 22 minutes 30 seconds  ?Findings:                 Hemorrhoids were found on perianal exam. ?                          Four sessile polyps were found in the ascending  ?                          colon. The polyps were 3 to 6 mm in size. These  ?                          polyps were removed with a cold snare. Resection  ?                          and retrieval were complete. Estimated blood loss  ?                          was minimal. ?  Three sessile polyps were found in the rectum and  ?                          sigmoid colon. The polyps were 2 to 4 mm in size.  ?                          These polyps were removed with a cold snare.  ?                          Resection and retrieval were complete. Estimated  ?                          blood loss was minimal. ?                          Multiple small and large-mouthed diverticula were  ?                          found in the sigmoid colon and descending colon. ?                          Non-bleeding internal hemorrhoids were found during  ?                          retroflexion. The hemorrhoids were medium-sized. ?                          A moderate amount of semi-liquid stool was found in  ?                          the entire colon. Lavage of the area was performed  ?                          using copious amounts of tap water, resulting in  ?                           clearance with fair visualization. ?Complications:            No immediate complications. ?Estimated Blood Loss:     Estimated blood loss was minimal. ?Impression:               - Preparation of the colon was fair. ?                          - Hemorrhoids found on perianal exam. ?                          - Four 3 to 6 mm polyps in the ascending colon,  ?                          removed with a cold snare. Resected and retrieved. ?                          - Three 2 to 4 mm  polyps in the rectum and in the  ?                          sigmoid colon, removed with a cold snare. Resected  ?                          and retrieved. ?                          - Diverticulosis in the sigmoid colon and in the  ?                          descending colon. ?                          - Non-bleeding internal hemorrhoids. ?                          - Stool in the entire examined colon. ?Recommendation:           - Patient has a contact number available for  ?                          emergencies. The signs and symptoms of potential  ?                          delayed complications were discussed with the  ?                          patient. Return to normal activities tomorrow.  ?                          Written discharge instructions were provided to the  ?                          patient. ?                          - Resume previous diet. ?                          - Continue present medications. ?                          - Await pathology results. ?                          - Repeat colonoscopy in 2 years because the bowel  ?                          preparation was suboptimal and for surveillance. ?                          - 2-day bowel preparation with repeat colonoscopy. ?                          - Return to GI  office PRN. ?                          - Use fiber, for example Citrucel, Fibercon, Konsyl  ?                          or Metamucil. ?Gerrit Heck, MD ?03/16/2021 8:47:43 AM ?

## 2021-03-16 NOTE — Progress Notes (Signed)
? ?GASTROENTEROLOGY PROCEDURE H&P NOTE  ? ?Primary Care Physician: ?Mosie Lukes, MD ? ? ? ?Reason for Procedure:  Colon Cancer screening ? ?Plan:    Colonoscopy ? ?Patient is appropriate for endoscopic procedure(s) in the ambulatory ([facility]) setting. ? ?The nature of the procedure, as well as the risks, benefits, and alternatives were carefully and thoroughly reviewed with the patient. Ample time for discussion and questions allowed. The patient understood, was satisfied, and agreed to proceed.  ? ? ? ?HPI: ?David Esparza is a 71 y.o. male who presents for colonoscopy for routine Colon Cancer screening.  No active GI symptoms.  No known family history of colon cancer or related malignancy.  Patient is otherwise without complaints or active issues today. ? ?Last colonoscopy was 09/2010 and notable for left-sided diverticulosis, otherwise normal. ? ?Past Medical History:  ?Diagnosis Date  ? Abnormal LFTs 09/06/2011  ? Acute bronchitis 08/16/2010  ? Anxiety 09/06/2011  ? Chicken pox as a child  ? Cutaneous skin tags 07/27/2016  ? Diabetes mellitus without complication ([place])   ? Fatigue   ? Gout   ? Gout   ? Grief reaction 09/09/2012  ? Hyperglycemia 09/06/2011  ? Hyperlipidemia 09/06/2011  ? Hypertension   ? Insomnia 08/16/2010  ? Knee pain, acute 02/28/2012  ? Low testosterone 02/28/2012  ? Measles as a child  ? Mumps as a child  ? Nocturia 08/16/2010  ? Obese   ? Preventative health care 09/06/2011  ? Sleep apnea 09/06/2011  ? Unspecified vitamin D deficiency 02/28/2012  ? ? ?Past Surgical History:  ?Procedure Laterality Date  ? BACK SURGERY    ? COLONOSCOPY  09/16/10  ? Dr. Deatra Ina: moderate diverticulosis sigmoid and descending colon, o/w normal: repeat 10 yrs.  ? intestines sewn  70 yrs old  ? shot once made 6 holes  ? TONSILLECTOMY  as a child  ? VASECTOMY  4 yrs old  ? ? ?Prior to Admission medications   ?Medication Sig Start Date End Date Taking? Authorizing Provider  ?amLODipine (NORVASC) 5 MG tablet Take 1  tablet (5 mg total) by mouth daily. 01/26/21  Yes Debbrah Alar, NP  ?atorvastatin (LIPITOR) 10 MG tablet Take 1 tablet (10 mg total) by mouth at bedtime. 01/26/21  Yes Debbrah Alar, NP  ?Lancets MISC Check BS daily and PRN. Dx: E11.9 Dispense based on patient and insurance preference 07/31/18  Yes Mosie Lukes, MD  ?losartan-hydrochlorothiazide (HYZAAR) 100-25 MG tablet Take 1 tablet by mouth daily. for blood pressure 01/26/21  Yes Debbrah Alar, NP  ?metFORMIN (GLUCOPHAGE) 1000 MG tablet Take 1 tablet (1,000 mg total) by mouth 2 (two) times daily with a meal. 11/01/18  Yes Mosie Lukes, MD  ?tamsulosin (FLOMAX) 0.4 MG CAPS capsule Take 1 capsule (0.4 mg total) by mouth daily. 01/26/21  Yes Debbrah Alar, NP  ?sildenafil (VIAGRA) 100 MG tablet Take 0.5-1 tablets (50-100 mg total) by mouth daily as needed for erectile dysfunction. 08/21/17   Mosie Lukes, MD  ? ? ?Current Outpatient Medications  ?Medication Sig Dispense Refill  ? amLODipine (NORVASC) 5 MG tablet Take 1 tablet (5 mg total) by mouth daily. 90 tablet 1  ? atorvastatin (LIPITOR) 10 MG tablet Take 1 tablet (10 mg total) by mouth at bedtime. 90 tablet 1  ? Lancets MISC Check BS daily and PRN. Dx: E11.9 Dispense based on patient and insurance preference 100 each 0  ? losartan-hydrochlorothiazide (HYZAAR) 100-25 MG tablet Take 1 tablet by mouth daily. for blood pressure  90 tablet 1  ? metFORMIN (GLUCOPHAGE) 1000 MG tablet Take 1 tablet (1,000 mg total) by mouth 2 (two) times daily with a meal. 180 tablet 1  ? tamsulosin (FLOMAX) 0.4 MG CAPS capsule Take 1 capsule (0.4 mg total) by mouth daily. 30 capsule 3  ? sildenafil (VIAGRA) 100 MG tablet Take 0.5-1 tablets (50-100 mg total) by mouth daily as needed for erectile dysfunction. 30 tablet 2  ? ?Current Facility-Administered Medications  ?Medication Dose Route Frequency Provider Last Rate Last Admin  ? 0.9 %  sodium chloride infusion  500 mL Intravenous Once Inesha Sow V, DO       ? ? ?Allergies as of 03/16/2021  ? (No Known Allergies)  ? ? ?Family History  ?Problem Relation Age of Onset  ? Diabetes Mother   ?     type 2  ? Esophageal cancer Maternal Grandmother   ? Cancer Maternal Grandmother   ?     throat  ? Heart disease Maternal Grandfather   ? Diabetes Maternal Grandfather   ? Depression Daughter   ?     anxiety  ? Graves' disease Daughter   ? Colon polyps Neg Hx   ? Colon cancer Neg Hx   ? Rectal cancer Neg Hx   ? Stomach cancer Neg Hx   ? ? ?Social History  ? ?Socioeconomic History  ? Marital status: Married  ?  Spouse name: Not on file  ? Number of children: Not on file  ? Years of education: Not on file  ? Highest education level: Not on file  ?Occupational History  ? Not on file  ?Tobacco Use  ? Smoking status: Former  ?  Types: Cigarettes  ?  Quit date: 08/30/1968  ?  Years since quitting: 52.5  ? Smokeless tobacco: Never  ? Tobacco comments:  ?  smoked in high school  ?Vaping Use  ? Vaping Use: Never used  ?Substance and Sexual Activity  ? Alcohol use: No  ?  Comment: very little  ? Drug use: No  ? Sexual activity: Yes  ?  Partners: Female  ?Other Topics Concern  ? Not on file  ?Social History Narrative  ? Not on file  ? ?Social Determinants of Health  ? ?Financial Resource Strain: Not on file  ?Food Insecurity: Not on file  ?Transportation Needs: Not on file  ?Physical Activity: Not on file  ?Stress: Not on file  ?Social Connections: Not on file  ?Intimate Partner Violence: Not on file  ? ? ?Physical Exam: ?Vital signs in last 24 hours: ?'@BP'$  111/72   Pulse 89   Temp 97.8 ?F (36.6 ?C)   Ht '5\' 7"'$  (1.702 m)   Wt 250 lb (113.4 kg)   SpO2 97%   BMI 39.16 kg/m?  ?GEN: NAD ?EYE: Sclerae anicteric ?ENT: MMM ?CV: Non-tachycardic ?Pulm: CTA b/l ?GI: Soft, NT/ND ?NEURO:  Alert & Oriented x 3 ? ? ?Gerrit Heck, DO ?[facility] ? ? ?03/16/2021 7:51 AM ? ?

## 2021-03-16 NOTE — Patient Instructions (Addendum)
Handouts on polyps, diverticulosis, and hemorrhoids given to you today  ?Await pathology results  ?Fiber supplement recommended ? ? ?YOU HAD AN ENDOSCOPIC PROCEDURE TODAY AT [facility]:   Refer to the procedure report that was given to you for any specific questions about what was found during the examination.  If the procedure report does not answer your questions, please call your gastroenterologist to clarify.  If you requested that your care partner not be given the details of your procedure findings, then the procedure report has been included in a sealed envelope for you to review at your convenience later. ? ?YOU SHOULD EXPECT: Some feelings of bloating in the abdomen. Passage of more gas than usual.  Walking can help get rid of the air that was put into your GI tract during the procedure and reduce the bloating. If you had a lower endoscopy (such as a colonoscopy or flexible sigmoidoscopy) you may notice spotting of blood in your stool or on the toilet paper. If you underwent a bowel prep for your procedure, you may not have a normal bowel movement for a few days. ? ?Please Note:  You might notice some irritation and congestion in your nose or some drainage.  This is from the oxygen used during your procedure.  There is no need for concern and it should clear up in a day or so. ? ?SYMPTOMS TO REPORT IMMEDIATELY: ? ?Following lower endoscopy (colonoscopy or flexible sigmoidoscopy): ? Excessive amounts of blood in the stool ? Significant tenderness or worsening of abdominal pains ? Swelling of the abdomen that is new, acute ? Fever of 100?F or higher ? ?For urgent or emergent issues, a gastroenterologist can be reached at any hour by calling [phone]. ?Do not use MyChart messaging for urgent concerns.  ? ? ?DIET:  We do recommend a small meal at first, but then you may proceed to your regular diet.  Drink plenty of fluids but you should avoid alcoholic beverages for 24  hours. ? ?ACTIVITY:  You should plan to take it easy for the rest of today and you should NOT DRIVE or use heavy machinery until tomorrow (because of the sedation medicines used during the test).   ? ?FOLLOW UP: ?Our staff will call the number listed on your records 48-72 hours following your procedure to check on you and address any questions or concerns that you may have regarding the information given to you following your procedure. If we do not reach you, we will leave a message.  We will attempt to reach you two times.  During this call, we will ask if you have developed any symptoms of COVID 19. If you develop any symptoms (ie: fever, flu-like symptoms, shortness of breath, cough etc.) before then, please call [phone].  If you test positive for Covid 19 in the 2 weeks post procedure, please call and report this information to [place].   ? ?If any biopsies were taken you will be contacted by phone or by letter within the next 1-3 weeks.  Please call us at [phone] if you have not heard about the biopsies in 3 weeks.  ? ? ?SIGNATURES/CONFIDENTIALITY: ?You and/or your care partner have signed paperwork which will be entered into your electronic medical record.  These signatures attest to the fact that that the information above on your After Visit Summary has been reviewed and is understood.  Full responsibility of the confidentiality of this discharge information lies with you and/or your care-partner.  ?

## 2021-03-18 ENCOUNTER — Telehealth: Payer: Self-pay | Admitting: *Deleted

## 2021-03-18 NOTE — Telephone Encounter (Signed)
First follow up call attempt.  LVM. 

## 2021-03-18 NOTE — Telephone Encounter (Signed)
?  Follow up Call- ? ?Call back number 03/16/2021  ?Post procedure Call Back phone  # [phone]  ?Permission to leave phone message Yes  ?Some recent data might be hidden  ?  ? ?Patient questions: ? ?Do you have a fever, pain , or abdominal swelling? No. ?Pain Score  0 * ? ?Have you tolerated food without any problems? Yes.   ? ?Have you been able to return to your normal activities? Yes.   ? ?Do you have any questions about your discharge instructions: ?Diet   No. ?Medications  No. ?Follow up visit  No. ? ?Do you have questions or concerns about your Care? No. ? ?Actions: ?* If pain score is 4 or above: ?No action needed, pain <4. ? ? ? ? ?

## 2021-03-24 ENCOUNTER — Encounter: Payer: Self-pay | Admitting: Gastroenterology

## 2021-03-26 ENCOUNTER — Ambulatory Visit (INDEPENDENT_AMBULATORY_CARE_PROVIDER_SITE_OTHER): Payer: Medicare Other | Admitting: Family

## 2021-03-26 ENCOUNTER — Encounter: Payer: Self-pay | Admitting: Family

## 2021-03-26 VITALS — BP 130/76 | HR 100 | Temp 98.9°F | Ht 67.0 in | Wt 255.4 lb

## 2021-03-26 DIAGNOSIS — L739 Follicular disorder, unspecified: Secondary | ICD-10-CM

## 2021-03-26 DIAGNOSIS — L989 Disorder of the skin and subcutaneous tissue, unspecified: Secondary | ICD-10-CM | POA: Diagnosis not present

## 2021-03-26 MED ORDER — TRAMADOL HCL 50 MG PO TABS: 50.0000 mg | ORAL_TABLET | Freq: Three times a day (TID) | ORAL | 0 refills | Status: AC | PRN

## 2021-03-26 MED ORDER — DOXYCYCLINE HYCLATE 100 MG PO TABS: 100.0000 mg | ORAL_TABLET | Freq: Two times a day (BID) | ORAL | 0 refills | Status: AC

## 2021-03-26 MED ORDER — MUPIROCIN 2 % EX OINT: 1.0000 "application " | TOPICAL_OINTMENT | Freq: Two times a day (BID) | CUTANEOUS | 0 refills | Status: DC

## 2021-03-26 NOTE — Progress Notes (Signed)
?David Esparza is a 71 y.o. male with the following history as recorded in EpicCare:  ?Patient Active Problem List  ? Diagnosis Date Noted  ? Benign prostatic hyperplasia with urinary frequency 01/26/2021  ? Diarrhea 01/26/2021  ? Abscess of left leg 01/26/2021  ? Sun-damaged skin 03/07/2018  ? Hyperlipidemia associated with type 2 diabetes mellitus ([place]) 03/07/2018  ? Abnormality of thoracic aorta 03/07/2018  ? Fatty liver 01/11/2018  ? Cutaneous skin tags 07/27/2016  ? H/O Bell's palsy 07/06/2016  ? SOBOE (shortness of breath on exertion) 11/10/2014  ? Erectile dysfunction 04/11/2014  ? Knee pain, left 03/24/2013  ? Change in hearing 02/05/2013  ? Low testosterone 02/28/2012  ? Vitamin D deficiency 02/28/2012  ? Knee pain, acute 02/28/2012  ? Encounter for Medicare annual wellness exam 09/06/2011  ? Sleep apnea 09/06/2011  ? Anxiety 09/06/2011  ? Type 2 diabetes mellitus with obesity ([place]) 09/06/2011  ? Abnormal LFTs 09/06/2011  ? Nocturia 08/16/2010  ? Insomnia 08/16/2010  ? Hypertension   ? Obese   ? Fatigue   ? Measles   ? Mumps   ?  ?Current Outpatient Medications  ?Medication Sig Dispense Refill  ? amLODipine (NORVASC) 5 MG tablet Take 1 tablet (5 mg total) by mouth daily. 90 tablet 1  ? atorvastatin (LIPITOR) 10 MG tablet Take 1 tablet (10 mg total) by mouth at bedtime. 90 tablet 1  ? doxycycline (VIBRA-TABS) 100 MG tablet Take 1 tablet (100 mg total) by mouth 2 (two) times daily for 10 days. 20 tablet 0  ? Lancets MISC Check BS daily and PRN. Dx: E11.9 Dispense based on patient and insurance preference 100 each 0  ? losartan-hydrochlorothiazide (HYZAAR) 100-25 MG tablet Take 1 tablet by mouth daily. for blood pressure 90 tablet 1  ? metFORMIN (GLUCOPHAGE) 1000 MG tablet Take 1 tablet (1,000 mg total) by mouth 2 (two) times daily with a meal. 180 tablet 1  ? mupirocin ointment (BACTROBAN) 2 % Apply 1 application. topically 2 (two) times daily. 22 g 0  ? sildenafil (VIAGRA) 100 MG tablet Take 0.5-1 tablets  (50-100 mg total) by mouth daily as needed for erectile dysfunction. 30 tablet 2  ? tamsulosin (FLOMAX) 0.4 MG CAPS capsule Take 1 capsule (0.4 mg total) by mouth daily. 30 capsule 3  ? traMADol (ULTRAM) 50 MG tablet Take 1 tablet (50 mg total) by mouth every 8 (eight) hours as needed for up to 5 days. 15 tablet 0  ? ?No current facility-administered medications for this visit.  ?  ?Allergies: Patient has no known allergies.  ?Past Medical History:  ?Diagnosis Date  ? Abnormal LFTs 09/06/2011  ? Acute bronchitis 08/16/2010  ? Anxiety 09/06/2011  ? Chicken pox as a child  ? Cutaneous skin tags 07/27/2016  ? Diabetes mellitus without complication ([place])   ? Fatigue   ? Gout   ? Gout   ? Grief reaction 09/09/2012  ? Hyperglycemia 09/06/2011  ? Hyperlipidemia 09/06/2011  ? Hypertension   ? Insomnia 08/16/2010  ? Knee pain, acute 02/28/2012  ? Low testosterone 02/28/2012  ? Measles as a child  ? Mumps as a child  ? Nocturia 08/16/2010  ? Obese   ? Preventative health care 09/06/2011  ? Sleep apnea 09/06/2011  ? Unspecified vitamin D deficiency 02/28/2012  ?  ?Past Surgical History:  ?Procedure Laterality Date  ? BACK SURGERY    ? COLONOSCOPY  09/16/10  ? Dr. Deatra Ina: moderate diverticulosis sigmoid and descending colon, o/w normal: repeat 10 yrs.  ?  intestines sewn  71 yrs old  ? shot once made 6 holes  ? TONSILLECTOMY  as a child  ? VASECTOMY  50 yrs old  ?  ?Family History  ?Problem Relation Age of Onset  ? Diabetes Mother   ?     type 2  ? Esophageal cancer Maternal Grandmother   ? Cancer Maternal Grandmother   ?     throat  ? Heart disease Maternal Grandfather   ? Diabetes Maternal Grandfather   ? Depression Daughter   ?     anxiety  ? Graves' disease Daughter   ? Colon polyps Neg Hx   ? Colon cancer Neg Hx   ? Rectal cancer Neg Hx   ? Stomach cancer Neg Hx   ?  ?Social History  ? ?Tobacco Use  ? Smoking status: Former  ?  Types: Cigarettes  ?  Quit date: 08/30/1968  ?  Years since quitting: 52.6  ? Smokeless tobacco:  Never  ? Tobacco comments:  ?  smoked in high school  ?Substance Use Topics  ? Alcohol use: No  ?  Comment: very little  ?  ?Subjective:  ?Concern for infected "bump" in left scrotum/ groin area x 1 month; has been slowly increasing in size; mildly painful;  ?Did have infected sores on his left lower leg in the past few months as well- these healed on their own with persisting scars;  ? ? ? ?Objective:  ?Vitals:  ? 03/26/21 1344  ?BP: 130/76  ?Pulse: 100  ?Temp: 98.9 ?F (37.2 ?C)  ?TempSrc: Oral  ?SpO2: 96%  ?Weight: 255 lb 6.4 oz (115.8 kg)  ?Height: '5\' 7"'$  (1.702 m)  ?  ?General: Well developed, well nourished, in no acute distress  ?Skin : Warm and dry.  ?Head: Normocephalic and atraumatic  ?Eyes: Sclera and conjunctiva clear; pupils round and reactive to light; extraocular movements intact  ?Ears: External normal; canals clear; tympanic membranes normal  ?Oropharynx: Pink, supple. No suspicious lesions  ?Neck: Supple without thyromegaly, adenopathy  ?Lungs: Respirations unlabored;  ?Neurologic: Alert and oriented; speech intact; face symmetrical; moves all extremities well; CNII-XII intact without focal deficit  ?Folliculitis/ small boil noted in left groin area; ? ?Assessment:  ?1. Folliculitis   ?2. Changing skin lesion   ?  ?Plan:  ?Continue to apply warm compresses; concern for MRSA based on appearance of legs; will treat with Doxycycline x 10 days, Bactroban; he will continue Ibuprofen at least bid for pain and small amount of Tramadol given for breakthrough pain; ?Refer to dermatology for concerning lesion on right wrist- ? [place] vs [place] vs SK; ? ?This visit occurred during the SARS-CoV-2 public health emergency.  Safety protocols were in place, including screening questions prior to the visit, additional usage of staff PPE, and extensive cleaning of exam room while observing appropriate contact time as indicated for disinfecting solutions.  ? ? ?No follow-ups on file.  ?Orders Placed This Encounter   ?Procedures  ? Ambulatory referral to Dermatology  ?  Referral Priority:   Routine  ?  Referral Type:   Consultation  ?  Referral Reason:   Specialty Services Required  ?  Requested Specialty:   Dermatology  ?  Number of Visits Requested:   1  ?  ?Requested Prescriptions  ? ?Signed Prescriptions Disp Refills  ? doxycycline (VIBRA-TABS) 100 MG tablet 20 tablet 0  ?  Sig: Take 1 tablet (100 mg total) by mouth 2 (two) times daily for 10 days.  ?  traMADol (ULTRAM) 50 MG tablet 15 tablet 0  ?  Sig: Take 1 tablet (50 mg total) by mouth every 8 (eight) hours as needed for up to 5 days.  ? mupirocin ointment (BACTROBAN) 2 % 22 g 0  ?  Sig: Apply 1 application. topically 2 (two) times daily.  ?  ? ?

## 2022-02-22 ENCOUNTER — Telehealth: Payer: Self-pay | Admitting: Family

## 2022-02-22 ENCOUNTER — Ambulatory Visit (INDEPENDENT_AMBULATORY_CARE_PROVIDER_SITE_OTHER): Payer: BC Managed Care – PPO | Admitting: Family

## 2022-02-22 VITALS — BP 122/82 | HR 92 | Temp 98.0°F | Resp 16 | Ht 67.0 in | Wt 238.0 lb

## 2022-02-22 DIAGNOSIS — I10 Essential (primary) hypertension: Secondary | ICD-10-CM

## 2022-02-22 DIAGNOSIS — E669 Obesity, unspecified: Secondary | ICD-10-CM | POA: Diagnosis not present

## 2022-02-22 DIAGNOSIS — E1169 Type 2 diabetes mellitus with other specified complication: Secondary | ICD-10-CM

## 2022-02-22 DIAGNOSIS — E785 Hyperlipidemia, unspecified: Secondary | ICD-10-CM | POA: Diagnosis not present

## 2022-02-22 DIAGNOSIS — Z125 Encounter for screening for malignant neoplasm of prostate: Secondary | ICD-10-CM | POA: Diagnosis not present

## 2022-02-22 DIAGNOSIS — Z0000 Encounter for general adult medical examination without abnormal findings: Secondary | ICD-10-CM | POA: Diagnosis not present

## 2022-02-22 LAB — PSA, MEDICARE: PSA: 0.48 ng/ml (ref 0.10–4.00)

## 2022-02-22 LAB — COMPREHENSIVE METABOLIC PANEL
ALT: 26 U/L (ref 0–53)
AST: 37 U/L (ref 0–37)
Albumin: 3.5 g/dL (ref 3.5–5.2)
Alkaline Phosphatase: 87 U/L (ref 39–117)
BUN: 20 mg/dL (ref 6–23)
CO2: 27 mEq/L (ref 19–32)
Calcium: 8.4 mg/dL (ref 8.4–10.5)
Chloride: 99 mEq/L (ref 96–112)
Creatinine, Ser: 1.19 mg/dL (ref 0.40–1.50)
GFR: 60.94 mL/min (ref >60.00)
Glucose, Bld: 121 mg/dL — ABNORMAL HIGH (ref 70–99)
Potassium: 3.7 mEq/L (ref 3.5–5.1)
Sodium: 139 mEq/L (ref 135–145)
Total Bilirubin: 2.9 mg/dL — ABNORMAL HIGH (ref 0.2–1.2)
Total Protein: 7 g/dL (ref 6.0–8.3)

## 2022-02-22 LAB — LIPID PANEL
Cholesterol: 146 mg/dL (ref 0–200)
HDL: 33 mg/dL — ABNORMAL LOW (ref >39.00)
LDL Cholesterol: 89 mg/dL (ref 0–99)
NonHDL: 113.23
Total CHOL/HDL Ratio: 4
Triglycerides: 121 mg/dL (ref 0.0–149.0)
VLDL: 24.2 mg/dL (ref 0.0–40.0)

## 2022-02-22 LAB — MICROALBUMIN / CREATININE URINE RATIO
Creatinine,U: 353.3 mg/dL
Microalb Creat Ratio: 0.4 mg/g (ref 0.0–30.0)
Microalb, Ur: 1.5 mg/dL (ref 0.0–1.9)

## 2022-02-22 LAB — HEMOGLOBIN A1C: Hgb A1c MFr Bld: 6.1 % (ref 4.6–6.5)

## 2022-02-22 MED ORDER — SITAGLIPTIN PHOSPHATE 50 MG PO TABS: 50.0000 mg | ORAL_TABLET | Freq: Every day | ORAL | 5 refills | Status: DC

## 2022-02-22 MED ORDER — SHINGRIX 50 MCG/0.5ML IM SUSR: INTRAMUSCULAR | 1 refills | Status: DC

## 2022-02-22 NOTE — Assessment & Plan Note (Signed)
Declines flu shot/pneumovax. Will consider shingrix at the pharmacy. Requesting PSA. Colo due 2025.

## 2022-02-22 NOTE — Telephone Encounter (Signed)
Records release faxed 

## 2022-02-22 NOTE — Telephone Encounter (Addendum)
Please advise pt that his bilirubin (one of the liver tests) is very high and has risen since last check. I would recommend that he complete an [place] of his abdomen to take a closer look at his liver and gallbladder.  Sugar looks much better with his weight loss. Because of his diarrhea, let's stop his metformin and instead try [place] once daily.   Cholesterol is better than last year. Please start lipitor.   PSA is normal.

## 2022-02-22 NOTE — Progress Notes (Signed)
Annual Wellness Visit     Patient: David Esparza, Male    DOB: 08-14-1950, 72 y.o.   MRN: [id number]  Subjective  No chief complaint on file.   David Esparza is a 72 y.o. male who presents today for his Annual Wellness Visit. He reports consuming a diabetic diet.  Very little exercise, "out of habit."   He generally feels "a little tired." He reports sleeping well. He does not have additional problems to discuss today.   States that he had Bell's Palsy on the right side (first noticed a few months ago) and he continues to have some drooping. He has had it on the left side in the remote past and he regained all muscle function on the left side of his face.   DM2- tries to watch his diet. Notes decrease in his taste following Bell's palsy and has been eating less and intentionally trying to lose weight. He does not check his blood sugars, declines glucometer at this time.  He is overdue for DM follow up. Notes that he has had chronic diarrhea for "years." He is on Metformin and didn't realized that diarrhea is a possible side effect of metformin.   HPI  Vision:Within last year Dental is up to date   Past Medical History:  Diagnosis Date   Abnormal LFTs 09/06/2011   Acute bronchitis 08/16/2010   Anxiety 09/06/2011   Chicken pox as a child   Cutaneous skin tags 07/27/2016   Diabetes mellitus without complication ([place])    Fatigue    Gout    Gout    Grief reaction 09/09/2012   Hyperglycemia 09/06/2011   Hyperlipidemia 09/06/2011   Hypertension    Insomnia 08/16/2010   Knee pain, acute 02/28/2012   Low testosterone 02/28/2012   Measles as a child   Mumps as a child   Nocturia 08/16/2010   Obese    Preventative health care 09/06/2011   Sleep apnea 09/06/2011   Unspecified vitamin D deficiency 02/28/2012   Past Surgical History:  Procedure Laterality Date   BACK SURGERY     COLONOSCOPY  09/16/10   Dr. Deatra Ina: moderate diverticulosis sigmoid and descending colon, o/w  normal: repeat 10 yrs.   intestines sewn  72 yrs old   shot once made 6 holes   TONSILLECTOMY  as a child   VASECTOMY  80 yrs old   No Known Allergies    Lab Results  Component Value Date   HGBA1C 7.5 (H) 12/09/2020   HGBA1C 11.3 (H) 10/29/2018   HGBA1C 9.7 (H) 07/26/2018   Lab Results  Component Value Date   MICROALBUR 1.55 02/21/2012   LDLCALC 58 07/26/2018   CREATININE 1.45 12/09/2020     Medications: Outpatient Medications Prior to Visit  Medication Sig   amLODipine (NORVASC) 5 MG tablet Take 1 tablet (5 mg total) by mouth daily.   atorvastatin (LIPITOR) 10 MG tablet Take 1 tablet (10 mg total) by mouth at bedtime.   Lancets MISC Check BS daily and PRN. Dx: E11.9 Dispense based on patient and insurance preference   losartan-hydrochlorothiazide (HYZAAR) 100-25 MG tablet Take 1 tablet by mouth daily. for blood pressure   metFORMIN (GLUCOPHAGE) 1000 MG tablet Take 1 tablet (1,000 mg total) by mouth 2 (two) times daily with a meal.   sildenafil (VIAGRA) 100 MG tablet Take 0.5-1 tablets (50-100 mg total) by mouth daily as needed for erectile dysfunction.   [DISCONTINUED] mupirocin ointment (BACTROBAN) 2 % Apply 1 application. topically 2 (two)  times daily.   [DISCONTINUED] tamsulosin (FLOMAX) 0.4 MG CAPS capsule Take 1 capsule (0.4 mg total) by mouth daily.   No facility-administered medications prior to visit.    No Known Allergies  Patient Care Team: Mosie Lukes, MD as PCP - General (Family Medicine)  He does not have a HCPOA in place.  ROS  See HPI    Objective  BP 122/82 (BP Location: Right Arm, Patient Position: Sitting, Cuff Size: Large)   Pulse 92   Temp 98 F (36.7 C) (Oral)   Resp 16   Ht 5' 7"$  (1.702 m)   Wt 238 lb (108 kg)   SpO2 97%   BMI 37.28 kg/m  BP Readings from Last 3 Encounters:  02/22/22 122/82  03/26/21 130/76  03/16/21 104/62      Physical Exam   Physical Exam  Constitutional: He is oriented to person, place, and time. He  appears well-developed and well-nourished. No distress.  HENT:  Head: Normocephalic and atraumatic.  Right Ear: Tympanic membrane and ear canal normal.  Left Ear: Tympanic membrane and ear canal normal.  Mouth/Throat: Oropharynx is clear and moist.  Eyes: Pupils are equal, round, and reactive to light. No scleral icterus.  Neck: Normal range of motion. No thyromegaly present.  Cardiovascular: Normal rate and regular rhythm.   No murmur heard. Pulmonary/Chest: Effort normal and breath sounds normal. No respiratory distress. He has no wheezes. He has no rales. He exhibits no tenderness.  Abdominal: Soft. Bowel sounds are normal. He exhibits no distension and no mass. There is no tenderness. There is no rebound and no guarding.  Musculoskeletal: He exhibits no edema.  Lymphadenopathy:    He has no cervical adenopathy.  Neurological: He is alert and oriented to person, place, and time. He has normal patellar reflexes. He exhibits normal muscle tone. Coordination normal. Right cheek/lip drooping Skin: Skin is warm and dry.  Psychiatric: He has a normal mood and affect. His behavior is normal. Judgment and thought content normal.           Assessment & Plan:    Most recent functional status assessment:     No data to display         Most recent fall risk assessment:    02/22/2022    8:43 AM  [place] in the past year? 0    Most recent depression screenings:    02/22/2022    8:44 AM 03/26/2021    1:45 PM  PHQ 2/9 Scores  PHQ - 2 Score 2 0  PHQ- 9 Score 5    Most recent cognitive screening:     No data to display        He did not draw time correctly, but it was unclear if he heard the instructions correctly-forgot his hearing aids.   Vision/Hearing Screen: Vision Screening   Right eye Left eye Both eyes  Without correction     With correction 20/0 20/30 20/25    Wt Readings from Last 3 Encounters:  02/22/22 238 lb (108 kg)  03/26/21 255 lb 6.4 oz  (115.8 kg)  03/16/21 250 lb (113.4 kg)    Labs as ordered.  No results found for any visits on 02/22/22.    Assessment & Plan   Annual wellness visit done today including the all of the following: Reviewed patient's Family Medical History Reviewed and updated list of patient's medical providers Assessment of cognitive impairment was done Assessed patient's functional ability Established a written  schedule for health screening services Health Risk Assessent Completed and Reviewed  Exercise Activities and Dietary recommendations  Goals       Increase physical activity      Weight (lb) < 240 lb (108.9 kg) (pt-stated)      Pt has reached this goal         There is no immunization history on file for this patient.  Health Maintenance  Topic Date Due   COVID-19 Vaccine (1) Never done   FOOT EXAM  Never done   Diabetic kidney evaluation - Urine ACR  Never done   DTaP/Tdap/Td (1 - Tdap) Never done   Zoster Vaccines- Shingrix (1 of 2) Never done   OPHTHALMOLOGY EXAM  09/11/2014   HEMOGLOBIN A1C  06/08/2021   Diabetic kidney evaluation - eGFR measurement  12/09/2021   INFLUENZA VACCINE  04/10/2022 (Originally 08/10/2021)   Pneumonia Vaccine 22+ Years old (1 of 1 - PCV) 02/23/2023 (Originally 06/07/2014)   Medicare Annual Wellness (AWV)  02/23/2023   COLONOSCOPY (Pts 45-69yr Insurance coverage will need to be confirmed)  03/17/2023   Hepatitis C Screening  Completed   HPV VACCINES  Aged Out     Discussed health benefits of physical activity, and encouraged him to engage in regular exercise appropriate for his age and condition.    Problem List Items Addressed This Visit       Unprioritized   Type 2 diabetes mellitus with obesity ([place] - Primary    Control unknown. Will obtain A1C. Consider d/c metformin after reviewing glycemic control.       Relevant Orders   HgB A1c   Comp Met (CMET)   Urine Microalbumin w/creat. ratio   Hypertension    BP Readings from Last 3  Encounters:  02/22/22 122/82  03/26/21 130/76  03/16/21 104/62  BP at goal on hyzaar and amlodipine, continue same.       Hyperlipidemia associated with type 2 diabetes mellitus ([place]    On atorvastatin,obtain follow up lipid panel.      Relevant Orders   Lipid panel   Encounter for Medicare annual wellness exam    Declines flu shot/pneumovax. Will consider shingrix at the pharmacy. Requesting PSA. Colo due 2025.       Other Visit Diagnoses     Screening for prostate cancer       Relevant Orders   PSA, Medicare ( [place] only)       No follow-ups on file.     MNance Pear NP

## 2022-02-22 NOTE — Assessment & Plan Note (Addendum)
Control unknown. Will obtain A1C. Consider d/c metformin after reviewing glycemic control.   Addendum, A1C at goal. D/c metformin due to chronic diarrhea. Instead begin januvia 41m once daily.

## 2022-02-22 NOTE — Assessment & Plan Note (Addendum)
On atorvastatin,obtain follow up lipid panel.

## 2022-02-22 NOTE — Assessment & Plan Note (Signed)
BP Readings from Last 3 Encounters:  02/22/22 122/82  03/26/21 130/76  03/16/21 104/62   BP at goal on hyzaar and amlodipine, continue same.

## 2022-02-22 NOTE — Telephone Encounter (Signed)
Please call [facility] and request copy of DM eye exam (on [address]).

## 2022-02-22 NOTE — Addendum Note (Signed)
Addended by: Debbrah Alar on: 02/22/2022 05:11 PM   Modules accepted: Orders

## 2022-02-23 NOTE — Telephone Encounter (Signed)
Called but no answer, lvm for patient to call back about results °

## 2022-02-24 NOTE — Telephone Encounter (Signed)
Called patient but he "did not have time at the to go over the results and will like to call back later to go over the information"

## 2022-02-26 ENCOUNTER — Ambulatory Visit (HOSPITAL_BASED_OUTPATIENT_CLINIC_OR_DEPARTMENT_OTHER)
Admission: RE | Admit: 2022-02-26 | Discharge: 2022-02-26 | Disposition: A | Discharge status: Home / Self Care | Payer: BC Managed Care – PPO | Source: Ambulatory Visit | Attending: Family | Admitting: Family

## 2022-02-28 ENCOUNTER — Telehealth: Payer: Self-pay | Admitting: Family

## 2022-02-28 NOTE — Telephone Encounter (Signed)
Please advise pt that his Ultrasound does show some fatty liver changes and his bile duct is slightly enlarged. This could indicate a blockage or stone in the bile duct.  I would like to refer him to GI for further evaluation/testing. Also, let us know if he notices jaundice (yellowing of his eyes or ski) and work on a low fat/low cholesterol diet/exercise and weight loss for the fatty liver.

## 2022-02-28 NOTE — Telephone Encounter (Signed)
Patient notified of results, provider's comments and referral. He verbalizes understanding.

## 2022-03-01 ENCOUNTER — Telehealth: Payer: Self-pay | Admitting: Gastroenterology

## 2022-03-01 DIAGNOSIS — R17 Unspecified jaundice: Secondary | ICD-10-CM

## 2022-03-01 DIAGNOSIS — K838 Other specified diseases of biliary tract: Secondary | ICD-10-CM

## 2022-03-01 NOTE — Telephone Encounter (Addendum)
Patient notified of additional recommendations via [place]. MRCP order in epic. Secure staff message sent to radiology scheduling to contact patient to set up appt. Lab orders in epic.

## 2022-03-01 NOTE — Telephone Encounter (Signed)
Dr. Bryan Lemma,  Urgent referral in [place] from patient's PCP for elevated bilirubin, dilated CBD. Radiology is recommending MRCP. Please review and advise scheduling?  Thanks

## 2022-03-01 NOTE — Addendum Note (Signed)
Addended by: Yevette Edwards on: 03/01/2022 04:46 PM   Modules accepted: Orders

## 2022-03-01 NOTE — Telephone Encounter (Signed)
Dr. Bryan Lemma had a cancellation for Friday, 03/04/22 at 3 pm for a follow up appt. I scheduled the patient in this slot. I called and left patient a detailed vm with his appt information and the address to the [place] office. Pt has been advised to call back at his earliest convenience if this appt does not work for him. I also informed patient that I will send appointment information via MyChart.

## 2022-03-02 ENCOUNTER — Other Ambulatory Visit (INDEPENDENT_AMBULATORY_CARE_PROVIDER_SITE_OTHER): Payer: Medicare Other

## 2022-03-02 DIAGNOSIS — K838 Other specified diseases of biliary tract: Secondary | ICD-10-CM | POA: Diagnosis not present

## 2022-03-02 DIAGNOSIS — R17 Unspecified jaundice: Secondary | ICD-10-CM

## 2022-03-02 LAB — HEPATIC FUNCTION PANEL
ALT: 31 U/L (ref 0–53)
AST: 42 U/L — ABNORMAL HIGH (ref 0–37)
Albumin: 3.6 g/dL (ref 3.5–5.2)
Alkaline Phosphatase: 119 U/L — ABNORMAL HIGH (ref 39–117)
Bilirubin, Direct: 0.5 mg/dL — ABNORMAL HIGH (ref 0.0–0.3)
Total Bilirubin: 2.6 mg/dL — ABNORMAL HIGH (ref 0.2–1.2)
Total Protein: 7.2 g/dL (ref 6.0–8.3)

## 2022-03-03 LAB — BILIRUBIN, FRACTIONATED(TOT/DIR/INDIR)
Bilirubin, Direct: 0.5 mg/dL — ABNORMAL HIGH (ref 0.0–0.2)
Indirect Bilirubin: 2 mg/dL (calc) — ABNORMAL HIGH (ref 0.2–1.2)
Total Bilirubin: 2.5 mg/dL — ABNORMAL HIGH (ref 0.2–1.2)

## 2022-03-04 ENCOUNTER — Encounter: Payer: Self-pay | Admitting: Gastroenterology

## 2022-03-04 ENCOUNTER — Ambulatory Visit (INDEPENDENT_AMBULATORY_CARE_PROVIDER_SITE_OTHER): Payer: Medicare Other | Admitting: Gastroenterology

## 2022-03-04 VITALS — BP 122/84 | HR 108 | Ht 65.75 in | Wt 241.1 lb

## 2022-03-04 DIAGNOSIS — Z86010 Personal history of colonic polyps: Secondary | ICD-10-CM

## 2022-03-04 DIAGNOSIS — D696 Thrombocytopenia, unspecified: Secondary | ICD-10-CM | POA: Diagnosis not present

## 2022-03-04 DIAGNOSIS — K838 Other specified diseases of biliary tract: Secondary | ICD-10-CM

## 2022-03-04 DIAGNOSIS — R17 Unspecified jaundice: Secondary | ICD-10-CM | POA: Diagnosis not present

## 2022-03-04 DIAGNOSIS — R748 Abnormal levels of other serum enzymes: Secondary | ICD-10-CM

## 2022-03-04 DIAGNOSIS — K760 Fatty (change of) liver, not elsewhere classified: Secondary | ICD-10-CM

## 2022-03-04 NOTE — Patient Instructions (Signed)
  You have been scheduled for an MRCP at [facility] on 03/14/22. Your appointment time is 100 pm. Please arrive to admitting (at main entrance of the hospital) 30 minutes prior to your appointment time for registration purposes. Please make certain not to have anything to eat or drink 4 hours prior to your test. In addition, if you have any metal in your body, have a pacemaker or defibrillator, please be sure to let your ordering physician know. This test typically takes 45 minutes to 1 hour to complete. Should you need to reschedule, please call [phone] to do so.    _______________________________________________________  If your blood pressure at your visit was 140/90 or greater, please contact your primary care physician to follow up on this.  _______________________________________________________  If you are age 73 or older, your body mass index should be between 23-30. Your Body mass index is 39.22 kg/m. If this is out of the aforementioned range listed, please consider follow up with your Primary Care Provider.   __________________________________________________________  The [place] [facility] providers would like to encourage you to use [facility] to communicate with providers for non-urgent requests or questions.  Due to long hold times on the telephone, sending your provider a message by [facility] may be a faster and more efficient way to get a response.  Please allow 48 business hours for a response.  Please remember that this is for non-urgent requests.   Due to recent changes in healthcare laws, you may see the results of your imaging and laboratory studies on MyChart before your provider has had a chance to review them.  We understand that in some cases there may be results that are confusing or concerning to you. Not all laboratory results come back in the same time frame and the provider may be waiting for multiple results in order to interpret others.  Please give [place] 48 hours in  order for your provider to thoroughly review all the results before contacting the office for clarification of your results.   Thank you for choosing me and [facility].  Vito Cirigliano, D.O.

## 2022-03-04 NOTE — Progress Notes (Signed)
Chief Complaint: Elevated bilirubin, dilated CBD on imaging   Referring Provider:   Debbrah Alar, NP    HPI:     David Esparza is a 72 y.o. male with history of diabetes, HTN, HLD, gout, obesity (BMI 68), OSA, diverticulosis, Bell's palsy, referred to the [facility]ology Clinic for evaluation of elevated bilirubin and abnormal imaging.  He is otherwise without any symptoms or GI complaints today.  No prior known history of hepatobiliary or pancreatic disease.  No history of jaundice, icteric sclera, ascites.  He was seen by his PCM on 02/22/2022 for annual wellness visit, and noted to have elevated T. bili on routine labs with further evaluation as below:  - 02/22/2022: T. bili 2.9, AST/ALT 37/26, ALP 87, albumin 3.5.  Normal electrolytes, BUN/creatinine 20/1.2. Hemoglobin A1c 6.1% - 02/26/2022: RUQ [place]: CBD 9 mm, hepatic steatosis.  Normal GB.  Recommended MRCP - 03/02/2022: T. bili 2.5, D bili 0.5, indirect bilirubin 2.0.  AST/ALT 42/31, ALP 119   Endoscopic History: - 03/16/2021: Colonoscopy: 4 polyps in the ascending colon (path: SSP x 1), 3 polyps in the sigmoid/rectum (path: Hyperplastic polyps).  Left-sided diverticulosis.  Medium sized internal hemorrhoids.  Fair prep.  Recommended repeat in 2 years due to suboptimal prep.   Has had right sided Bell's palsy for the last 3-4 months.   Past Medical History:  Diagnosis Date   Abnormal LFTs 09/06/2011   Acute bronchitis 08/16/2010   Anxiety 09/06/2011   Chicken pox as a child   Cutaneous skin tags 07/27/2016   Diabetes mellitus without complication ([facility])    Fatigue    Gout    Gout    Grief reaction 09/09/2012   Hyperglycemia 09/06/2011   Hyperlipidemia 09/06/2011   Hypertension    Insomnia 08/16/2010   Knee pain, acute 02/28/2012   Low testosterone 02/28/2012   Measles as a child   Mumps as a child   Nocturia 08/16/2010   Obese    Preventative health care 09/06/2011   Sleep apnea 09/06/2011    Unspecified vitamin D deficiency 02/28/2012     Past Surgical History:  Procedure Laterality Date   BACK SURGERY     COLONOSCOPY  09/16/10   Dr. Deatra Ina: moderate diverticulosis sigmoid and descending colon, o/w normal: repeat 10 yrs.   intestines sewn  72 yrs old   shot once made 6 holes   TONSILLECTOMY  as a child   VASECTOMY  23 yrs old   Family History  Problem Relation Age of Onset   Diabetes Mother        type 2   Esophageal cancer Maternal Grandmother    Cancer Maternal Grandmother        throat   Heart disease Maternal Grandfather    Diabetes Maternal Grandfather    Depression Daughter        anxiety   Graves' disease Daughter    Colon polyps Neg Hx    Colon cancer Neg Hx    Rectal cancer Neg Hx    Stomach cancer Neg Hx    Social History   Tobacco Use   Smoking status: Former    Types: Cigarettes    Quit date: 08/30/1968    Years since quitting: 53.5   Smokeless tobacco: Never   Tobacco comments:    smoked in high school  Vaping Use   Vaping Use: Never used  Substance Use Topics   Alcohol use: No  Comment: very little   Drug use: No   Current Outpatient Medications  Medication Sig Dispense Refill   amLODipine (NORVASC) 5 MG tablet Take 1 tablet (5 mg total) by mouth daily. 90 tablet 1   atorvastatin (LIPITOR) 10 MG tablet Take 1 tablet (10 mg total) by mouth at bedtime. 90 tablet 1   losartan-hydrochlorothiazide (HYZAAR) 100-25 MG tablet Take 1 tablet by mouth daily. for blood pressure 90 tablet 1   metFORMIN (GLUCOPHAGE) 1000 MG tablet Take 1 tablet (1,000 mg total) by mouth 2 (two) times daily with a meal. 180 tablet 1   Zoster Vaccine Adjuvanted [facility]) injection Inject 0.'5mg'$  IM now and again in 2-6 months (Patient not taking: Reported on 03/04/2022) 0.5 mL 1   No current facility-administered medications for this visit.   No Known Allergies   Review of Systems: All systems reviewed and negative except where noted in HPI.     Physical Exam:     Wt Readings from Last 3 Encounters:  03/04/22 241 lb 2 oz (109.4 kg)  02/22/22 238 lb (108 kg)  03/26/21 255 lb 6.4 oz (115.8 kg)    BP 122/84 (BP Location: Left Arm, Patient Position: Sitting, Cuff Size: Normal)   Pulse (!) 108   Ht 5' 5.75" (1.67 m)   Wt 241 lb 2 oz (109.4 kg)   BMI 39.22 kg/m  Constitutional:  Pleasant, in no acute distress. Psychiatric: Normal mood and affect. Behavior is normal. Neurological: Alert and oriented to person place and time.   ASSESSMENT AND PLAN;   1) Elevated bilirubin 2) Elevated alkaline phosphatase 3) Dilated CBD Incidentally noted elevated bilirubin on routine labs.  ALP was initially normal, but very slightly elevated at 119 on repeat.  On review of labs, he has had intermittently mildly elevated alkaline phosphatase in the past, ranging up to 136 since 2019.  Similarly, has had periodically elevated T. bili up to 1.7 in the past.  Otherwise, alkaline phosphatase and bilirubin were both normal prior to 2019, extending back to 2012.  T bili fractionization earlier this week with predominantly indirect hyperbilirubinemia, highly suspicious for benign Gilberts syndrome.  Mildly dilated CBD at 9 mm on ultrasound.  CBD was normal at 4 mm in 2020. - Scheduled for MRCP  4) Hepatic steatosis Incidentally noted hepatic steatosis on ultrasound.  Similar appearance on ultrasound in 2020 and 2016.  Has had elevated ALT>AST in the past (peak ALT of 93 in 2016).  AST/ALT now normal.  Unclear if this is true normalization vs NASH conversion. - Will follow-up on MRCP results - Could consider further noninvasive testing such as Fib-4 score and or ultrasound elastography depending on MR results - Continue aggressive management of other comorbidities  5) Thrombocytopenia Thrombocytopenia since 06/2016, ranging 92-138.  Most recently 114.  Prior to 2018, normal PLT. - Unclear if this is primary hepatic etiology.  Albumin is very slightly low at 3.5 - As  above, can further evaluate for splenomegaly at time of MRI.  If unable to adequately visualize spleen, may benefit from CT to evaluate for portal hypertensive changes.  May need to consider EGD to evaluate for EV - Check PT/INR with next set of labs - Depending on evaluation, may benefit from hematology referral  6) History of colon polyps - Repeat colonoscopy in 2025 for ongoing polyp surveillance and due to suboptimal prep on previous colonoscopy - Will need extended 2-day prep with repeat colonoscopy  I spent over 30 minutes of time, including in depth chart  review, independent review of results as outlined above, communicating results with the patient directly, face-to-face time with the patient, coordinating care, and ordering studies and medications as appropriate, and documentation.    Lavena Bullion, DO, FACG  03/04/2022, 3:20 PM   Mosie Lukes, MD Debbrah Alar, NP

## 2022-03-14 ENCOUNTER — Other Ambulatory Visit: Payer: Self-pay | Admitting: Gastroenterology

## 2022-03-14 ENCOUNTER — Ambulatory Visit (HOSPITAL_COMMUNITY)
Admission: RE | Admit: 2022-03-14 | Discharge: 2022-03-14 | Disposition: A | Discharge status: Home / Self Care | Payer: BC Managed Care – PPO | Source: Ambulatory Visit | Attending: Gastroenterology | Admitting: Gastroenterology

## 2022-03-14 DIAGNOSIS — K838 Other specified diseases of biliary tract: Secondary | ICD-10-CM | POA: Insufficient documentation

## 2022-03-14 DIAGNOSIS — R17 Unspecified jaundice: Secondary | ICD-10-CM | POA: Insufficient documentation

## 2022-03-14 DIAGNOSIS — K8020 Calculus of gallbladder without cholecystitis without obstruction: Secondary | ICD-10-CM | POA: Diagnosis not present

## 2022-03-14 DIAGNOSIS — R935 Abnormal findings on diagnostic imaging of other abdominal regions, including retroperitoneum: Secondary | ICD-10-CM | POA: Diagnosis not present

## 2022-03-14 DIAGNOSIS — R748 Abnormal levels of other serum enzymes: Secondary | ICD-10-CM | POA: Insufficient documentation

## 2022-03-14 DIAGNOSIS — K760 Fatty (change of) liver, not elsewhere classified: Secondary | ICD-10-CM | POA: Diagnosis not present

## 2022-03-14 MED ORDER — GADOBUTROL 1 MMOL/ML IV SOLN
10.0000 mL | Freq: Once | INTRAVENOUS | Status: AC | PRN
  Administered 2022-03-14: 10 mL via INTRAVENOUS

## 2022-03-15 ENCOUNTER — Other Ambulatory Visit: Payer: Self-pay

## 2022-03-15 DIAGNOSIS — K760 Fatty (change of) liver, not elsewhere classified: Secondary | ICD-10-CM

## 2022-03-15 DIAGNOSIS — R17 Unspecified jaundice: Secondary | ICD-10-CM

## 2022-03-15 DIAGNOSIS — K838 Other specified diseases of biliary tract: Secondary | ICD-10-CM

## 2022-03-15 DIAGNOSIS — K7460 Unspecified cirrhosis of liver: Secondary | ICD-10-CM

## 2022-03-15 DIAGNOSIS — R748 Abnormal levels of other serum enzymes: Secondary | ICD-10-CM

## 2022-03-15 DIAGNOSIS — I8510 Secondary esophageal varices without bleeding: Secondary | ICD-10-CM

## 2022-03-15 NOTE — Addendum Note (Signed)
Addended by: Yevette Edwards on: 03/15/2022 04:51 PM   Modules accepted: Orders

## 2022-03-17 ENCOUNTER — Other Ambulatory Visit (INDEPENDENT_AMBULATORY_CARE_PROVIDER_SITE_OTHER): Payer: BC Managed Care – PPO

## 2022-03-17 DIAGNOSIS — R748 Abnormal levels of other serum enzymes: Secondary | ICD-10-CM | POA: Diagnosis not present

## 2022-03-17 DIAGNOSIS — K760 Fatty (change of) liver, not elsewhere classified: Secondary | ICD-10-CM

## 2022-03-17 DIAGNOSIS — K838 Other specified diseases of biliary tract: Secondary | ICD-10-CM

## 2022-03-17 DIAGNOSIS — K7460 Unspecified cirrhosis of liver: Secondary | ICD-10-CM | POA: Diagnosis not present

## 2022-03-17 DIAGNOSIS — K7581 Nonalcoholic steatohepatitis (NASH): Secondary | ICD-10-CM | POA: Diagnosis not present

## 2022-03-17 DIAGNOSIS — R17 Unspecified jaundice: Secondary | ICD-10-CM | POA: Diagnosis not present

## 2022-03-17 LAB — IBC + FERRITIN
Ferritin: 471.2 ng/mL — ABNORMAL HIGH (ref 22.0–322.0)
Iron: 150 ug/dL (ref 42–165)
Saturation Ratios: 71 % — ABNORMAL HIGH (ref 20.0–50.0)
TIBC: 211.4 ug/dL — ABNORMAL LOW (ref 250.0–450.0)
Transferrin: 151 mg/dL — ABNORMAL LOW (ref 212.0–360.0)

## 2022-03-17 LAB — PROTIME-INR
INR: 1.4 ratio — ABNORMAL HIGH (ref 0.8–1.0)
Prothrombin Time: 14.6 s — ABNORMAL HIGH (ref 9.6–13.1)

## 2022-03-21 LAB — IGG: IgG (Immunoglobin G), Serum: 1513 mg/dL (ref 600–1540)

## 2022-03-21 LAB — ANTI-SMOOTH MUSCLE ANTIBODY, IGG: Actin (Smooth Muscle) Antibody (IGG): <20 U (ref <20)

## 2022-03-21 LAB — HEPATITIS A ANTIBODY, TOTAL: Hepatitis A AB,Total: REACTIVE — AB

## 2022-03-21 LAB — MITOCHONDRIAL ANTIBODIES: Mitochondrial M2 Ab, IgG: <=20 U (ref <=20.0)

## 2022-03-21 LAB — HEPATITIS B CORE ANTIBODY, TOTAL: Hep B Core Total Ab: NONREACTIVE

## 2022-03-21 LAB — HEPATITIS B SURFACE ANTIGEN: Hepatitis B Surface Ag: NONREACTIVE

## 2022-03-21 LAB — ALPHA-1-ANTITRYPSIN: A-1 Antitrypsin, Ser: 146 mg/dL (ref 83–199)

## 2022-03-21 LAB — ANA: Anti Nuclear Antibody (ANA): NEGATIVE

## 2022-03-21 LAB — HEPATITIS C ANTIBODY: Hepatitis C Ab: NONREACTIVE

## 2022-03-21 LAB — HEPATITIS B SURFACE ANTIBODY,QUALITATIVE: Hep B S Ab: NONREACTIVE

## 2022-03-21 LAB — CERULOPLASMIN: Ceruloplasmin: 27 mg/dL (ref 18–36)

## 2022-03-23 ENCOUNTER — Encounter: Payer: Self-pay | Admitting: Family

## 2022-03-23 ENCOUNTER — Ambulatory Visit (INDEPENDENT_AMBULATORY_CARE_PROVIDER_SITE_OTHER): Payer: BC Managed Care – PPO | Admitting: Family

## 2022-03-23 ENCOUNTER — Telehealth: Payer: Self-pay | Admitting: Family

## 2022-03-23 VITALS — BP 136/80 | HR 93 | Temp 97.9°F | Resp 16 | Wt 245.0 lb

## 2022-03-23 DIAGNOSIS — R5383 Other fatigue: Secondary | ICD-10-CM | POA: Diagnosis not present

## 2022-03-23 DIAGNOSIS — L0291 Cutaneous abscess, unspecified: Secondary | ICD-10-CM | POA: Insufficient documentation

## 2022-03-23 DIAGNOSIS — K429 Umbilical hernia without obstruction or gangrene: Secondary | ICD-10-CM | POA: Insufficient documentation

## 2022-03-23 DIAGNOSIS — D696 Thrombocytopenia, unspecified: Secondary | ICD-10-CM

## 2022-03-23 LAB — CBC WITH DIFFERENTIAL/PLATELET
Basophils Absolute: 0.1 10*3/uL (ref 0.0–0.1)
Basophils Relative: 0.9 % (ref 0.0–3.0)
Eosinophils Absolute: 0.1 10*3/uL (ref 0.0–0.7)
Eosinophils Relative: 2.3 % (ref 0.0–5.0)
HCT: 42.7 % (ref 39.0–52.0)
Hemoglobin: 14.8 g/dL (ref 13.0–17.0)
Lymphocytes Relative: 14.2 % (ref 12.0–46.0)
Lymphs Abs: 0.9 10*3/uL (ref 0.7–4.0)
MCHC: 34.7 g/dL (ref 30.0–36.0)
MCV: 100.3 fl — ABNORMAL HIGH (ref 78.0–100.0)
Monocytes Absolute: 0.5 10*3/uL (ref 0.1–1.0)
Monocytes Relative: 8.5 % (ref 3.0–12.0)
Neutro Abs: 4.8 10*3/uL (ref 1.4–7.7)
Neutrophils Relative %: 74.1 % (ref 43.0–77.0)
Platelets: 83 10*3/uL — ABNORMAL LOW (ref 150.0–400.0)
RBC: 4.25 Mil/uL (ref 4.22–5.81)
RDW: 14.9 % (ref 11.5–15.5)
WBC: 6.4 10*3/uL (ref 4.0–10.5)

## 2022-03-23 LAB — TSH: TSH: 2.48 u[IU]/mL (ref 0.35–5.50)

## 2022-03-23 MED ORDER — MUPIROCIN 2 % EX OINT: 1.0000 | TOPICAL_OINTMENT | Freq: Two times a day (BID) | CUTANEOUS | 1 refills | Status: DC | PRN

## 2022-03-23 MED ORDER — DOXYCYCLINE HYCLATE 100 MG PO TABS: 100.0000 mg | ORAL_TABLET | Freq: Two times a day (BID) | ORAL | 0 refills | Status: DC

## 2022-03-23 NOTE — Assessment & Plan Note (Signed)
New.  Offered referral to surgeon. He wishes to hold off and watch for now which is reasonable. We discussed signs/symptoms of strangulated hernia and he understands to go to the ER if this occurs.

## 2022-03-23 NOTE — Assessment & Plan Note (Signed)
He is undergoing GI work up for hyperbilirubinemia. Will check CBC, TSH.  I am suspicious that OSA may be a contributing factor to his fatigue. States that he has not worn CPAP in >20 years.  Recommended referral to sleep specialist. He declines at this time.
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
## 2022-03-23 NOTE — Telephone Encounter (Signed)
Lab work shows that his platelets are lower than usual. I would like for him to see hematology for this upstairs.   Thyroid testing is normal.   Can you please ask lab to add on b12 level dx macrocytosis?

## 2022-03-23 NOTE — Assessment & Plan Note (Signed)
New. Notes that he is prone to recurrent abscesses.  Likely representing hidradenits suppurativa. Will rx with doxycycline, topical mupuricin ointment. Call if area does not continue to improve.  Recommended that he use antibacterial soap.

## 2022-03-24 ENCOUNTER — Other Ambulatory Visit: Payer: Self-pay

## 2022-03-24 ENCOUNTER — Other Ambulatory Visit (INDEPENDENT_AMBULATORY_CARE_PROVIDER_SITE_OTHER): Payer: BC Managed Care – PPO

## 2022-03-24 DIAGNOSIS — D7589 Other specified diseases of blood and blood-forming organs: Secondary | ICD-10-CM

## 2022-03-24 LAB — VITAMIN B12: Vitamin B-12: 594 pg/mL (ref 211–911)

## 2022-03-24 NOTE — Telephone Encounter (Signed)
Unable to reach patient by phone. MyChart message sent with the information

## 2022-03-24 NOTE — Telephone Encounter (Signed)
Order for B12 sent to [facility]

## 2022-03-25 ENCOUNTER — Other Ambulatory Visit: Payer: Self-pay

## 2022-03-25 DIAGNOSIS — H2513 Age-related nuclear cataract, bilateral: Secondary | ICD-10-CM | POA: Diagnosis not present

## 2022-03-25 DIAGNOSIS — H40033 Anatomical narrow angle, bilateral: Secondary | ICD-10-CM | POA: Diagnosis not present

## 2022-03-28 ENCOUNTER — Other Ambulatory Visit: Payer: Self-pay | Admitting: Family

## 2022-03-28 DIAGNOSIS — D696 Thrombocytopenia, unspecified: Secondary | ICD-10-CM

## 2022-03-29 ENCOUNTER — Inpatient Hospital Stay (HOSPITAL_BASED_OUTPATIENT_CLINIC_OR_DEPARTMENT_OTHER): Payer: BC Managed Care – PPO | Admitting: Family

## 2022-03-29 ENCOUNTER — Other Ambulatory Visit: Payer: Self-pay

## 2022-03-29 ENCOUNTER — Encounter: Payer: Self-pay | Admitting: Family

## 2022-03-29 ENCOUNTER — Inpatient Hospital Stay: Payer: BC Managed Care – PPO | Attending: Hematology & Oncology

## 2022-03-29 VITALS — BP 155/90 | HR 88 | Temp 99.0°F | Resp 18 | Ht 67.0 in | Wt 244.4 lb

## 2022-03-29 DIAGNOSIS — D696 Thrombocytopenia, unspecified: Secondary | ICD-10-CM

## 2022-03-29 DIAGNOSIS — K7460 Unspecified cirrhosis of liver: Secondary | ICD-10-CM

## 2022-03-29 DIAGNOSIS — Z800 Family history of malignant neoplasm of digestive organs: Secondary | ICD-10-CM | POA: Diagnosis not present

## 2022-03-29 DIAGNOSIS — Z87891 Personal history of nicotine dependence: Secondary | ICD-10-CM | POA: Insufficient documentation

## 2022-03-29 DIAGNOSIS — R17 Unspecified jaundice: Secondary | ICD-10-CM

## 2022-03-29 DIAGNOSIS — K760 Fatty (change of) liver, not elsewhere classified: Secondary | ICD-10-CM

## 2022-03-29 DIAGNOSIS — K838 Other specified diseases of biliary tract: Secondary | ICD-10-CM

## 2022-03-29 DIAGNOSIS — K7581 Nonalcoholic steatohepatitis (NASH): Secondary | ICD-10-CM | POA: Diagnosis not present

## 2022-03-29 DIAGNOSIS — Z23 Encounter for immunization: Secondary | ICD-10-CM

## 2022-03-29 LAB — CMP (CANCER CENTER ONLY)
ALT: 32 U/L (ref 0–44)
AST: 50 U/L — ABNORMAL HIGH (ref 15–41)
Albumin: 3.6 g/dL (ref 3.5–5.0)
Alkaline Phosphatase: 110 U/L (ref 38–126)
Anion gap: 7 (ref 5–15)
BUN: 12 mg/dL (ref 8–23)
CO2: 29 mmol/L (ref 22–32)
Calcium: 9.2 mg/dL (ref 8.9–10.3)
Chloride: 105 mmol/L (ref 98–111)
Creatinine: 1.1 mg/dL (ref 0.61–1.24)
GFR, Estimated: >60 mL/min (ref >60)
Glucose, Bld: 140 mg/dL — ABNORMAL HIGH (ref 70–99)
Potassium: 4.6 mmol/L (ref 3.5–5.1)
Sodium: 141 mmol/L (ref 135–145)
Total Bilirubin: 2.6 mg/dL — ABNORMAL HIGH (ref 0.3–1.2)
Total Protein: 7.2 g/dL (ref 6.5–8.1)

## 2022-03-29 LAB — CBC WITH DIFFERENTIAL (CANCER CENTER ONLY)
Abs Immature Granulocytes: 0.02 10*3/uL (ref 0.00–0.07)
Basophils Absolute: 0 10*3/uL (ref 0.0–0.1)
Basophils Relative: 1 %
Eosinophils Absolute: 0.2 10*3/uL (ref 0.0–0.5)
Eosinophils Relative: 3 %
HCT: 42.7 % (ref 39.0–52.0)
Hemoglobin: 15 g/dL (ref 13.0–17.0)
Immature Granulocytes: 0 %
Lymphocytes Relative: 19 %
Lymphs Abs: 1.1 10*3/uL (ref 0.7–4.0)
MCH: 34.2 pg — ABNORMAL HIGH (ref 26.0–34.0)
MCHC: 35.1 g/dL (ref 30.0–36.0)
MCV: 97.3 fL (ref 80.0–100.0)
Monocytes Absolute: 0.6 10*3/uL (ref 0.1–1.0)
Monocytes Relative: 10 %
Neutro Abs: 3.8 10*3/uL (ref 1.7–7.7)
Neutrophils Relative %: 67 %
Platelet Count: 68 10*3/uL — ABNORMAL LOW (ref 150–400)
RBC: 4.39 MIL/uL (ref 4.22–5.81)
RDW: 14.5 % (ref 11.5–15.5)
WBC Count: 5.7 10*3/uL (ref 4.0–10.5)
nRBC: 0 % (ref 0.0–0.2)

## 2022-03-29 LAB — SAVE SMEAR(SSMR), FOR PROVIDER SLIDE REVIEW

## 2022-03-29 LAB — LACTATE DEHYDROGENASE: LDH: 248 U/L — ABNORMAL HIGH (ref 98–192)
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
## 2022-04-18 ENCOUNTER — Other Ambulatory Visit: Payer: Self-pay | Admitting: Family Medicine

## 2022-04-18 ENCOUNTER — Telehealth: Payer: Self-pay | Admitting: Family Medicine

## 2022-04-18 DIAGNOSIS — K469 Unspecified abdominal hernia without obstruction or gangrene: Secondary | ICD-10-CM

## 2022-04-18 NOTE — Telephone Encounter (Signed)
Pt called stating that he would like to look into a referral to remove the hernia he has in his abdomen. Pt stated that it was discussed in his last appointment with Melissa.

## 2022-04-19 ENCOUNTER — Other Ambulatory Visit: Payer: Self-pay | Admitting: Family Medicine

## 2022-04-29 ENCOUNTER — Encounter: Payer: Self-pay | Admitting: Gastroenterology

## 2022-04-29 ENCOUNTER — Ambulatory Visit: Payer: BC Managed Care – PPO | Admitting: Gastroenterology

## 2022-04-29 VITALS — BP 112/74 | HR 76 | Temp 98.7°F | Resp 20 | Ht 65.0 in | Wt 241.0 lb

## 2022-04-29 DIAGNOSIS — R17 Unspecified jaundice: Secondary | ICD-10-CM

## 2022-04-29 DIAGNOSIS — K3189 Other diseases of stomach and duodenum: Secondary | ICD-10-CM

## 2022-04-29 DIAGNOSIS — K7460 Unspecified cirrhosis of liver: Secondary | ICD-10-CM

## 2022-04-29 DIAGNOSIS — G4730 Sleep apnea, unspecified: Secondary | ICD-10-CM | POA: Diagnosis not present

## 2022-04-29 DIAGNOSIS — Z0389 Encounter for observation for other suspected diseases and conditions ruled out: Secondary | ICD-10-CM | POA: Diagnosis not present

## 2022-04-29 DIAGNOSIS — K7581 Nonalcoholic steatohepatitis (NASH): Secondary | ICD-10-CM | POA: Diagnosis not present

## 2022-04-29 DIAGNOSIS — I8510 Secondary esophageal varices without bleeding: Secondary | ICD-10-CM | POA: Diagnosis not present

## 2022-04-29 DIAGNOSIS — F419 Anxiety disorder, unspecified: Secondary | ICD-10-CM | POA: Diagnosis not present

## 2022-04-29 DIAGNOSIS — K766 Portal hypertension: Secondary | ICD-10-CM

## 2022-04-29 DIAGNOSIS — I10 Essential (primary) hypertension: Secondary | ICD-10-CM | POA: Diagnosis not present

## 2022-04-29 DIAGNOSIS — E119 Type 2 diabetes mellitus without complications: Secondary | ICD-10-CM | POA: Diagnosis not present

## 2022-04-29 MED ORDER — CARVEDILOL 6.25 MG PO TABS: 6.2500 mg | ORAL_TABLET | Freq: Every day | ORAL | 0 refills | Status: DC

## 2022-04-29 MED ORDER — SODIUM CHLORIDE 0.9 % IV SOLN: 500.0000 mL | Freq: Once | INTRAVENOUS | Status: DC

## 2022-04-29 NOTE — Patient Instructions (Signed)
Please read handouts provided. Continue present medications. Await pathology results. Start Coreg 6.25 mg daily. Return to GI clinic.  YOU HAD AN ENDOSCOPIC PROCEDURE TODAY AT THE [facility]:   Refer to the procedure report that was given to you for any specific questions about what was found during the examination.  If the procedure report does not answer your questions, please call your gastroenterologist to clarify.  If you requested that your care partner not be given the details of your procedure findings, then the procedure report has been included in a sealed envelope for you to review at your convenience later.  YOU SHOULD EXPECT: Some feelings of bloating in the abdomen. Passage of more gas than usual.  Walking can help get rid of the air that was put into your GI tract during the procedure and reduce the bloating. If you had a lower endoscopy (such as a colonoscopy or flexible sigmoidoscopy) you may notice spotting of blood in your stool or on the toilet paper. If you underwent a bowel prep for your procedure, you may not have a normal bowel movement for a few days.  Please Note:  You might notice some irritation and congestion in your nose or some drainage.  This is from the oxygen used during your procedure.  There is no need for concern and it should clear up in a day or so.  SYMPTOMS TO REPORT IMMEDIATELY:  Following upper endoscopy (EGD)  Vomiting of blood or coffee ground material  New chest pain or pain under the shoulder blades  Painful or persistently difficult swallowing  New shortness of breath  Fever of 100F or higher  Black, tarry-looking stools  For urgent or emergent issues, a gastroenterologist can be reached at any hour by calling [phone]. Do not use MyChart messaging for urgent concerns.    DIET:  We do recommend a small meal at first, but then you may proceed to your regular diet.  Drink plenty of fluids but you should avoid alcoholic  beverages for 24 hours.  ACTIVITY:  You should plan to take it easy for the rest of today and you should NOT DRIVE or use heavy machinery until tomorrow (because of the sedation medicines used during the test).    FOLLOW UP: Our staff will call the number listed on your records the next business day following your procedure.  We will call around 7:15- 8:00 am to check on you and address any questions or concerns that you may have regarding the information given to you following your procedure. If we do not reach you, we will leave a message.     If any biopsies were taken you will be contacted by phone or by letter within the next 1-3 weeks.  Please call us at [phone] if you have not heard about the biopsies in 3 weeks.    SIGNATURES/CONFIDENTIALITY: You and/or your care partner have signed paperwork which will be entered into your electronic medical record.  These signatures attest to the fact that that the information above on your After Visit Summary has been reviewed and is understood.  Full responsibility of the confidentiality of this discharge information lies with you and/or your care-partner.

## 2022-04-29 NOTE — Progress Notes (Signed)
GASTROENTEROLOGY PROCEDURE H&P NOTE   Primary Care Physician: Bradd Canary, MD    Reason for Procedure:   Esophageal varices screening  Plan:    EGD  Patient is appropriate for endoscopic procedure(s) in the ambulatory ([facility]) setting.  The nature of the procedure, as well as the risks, benefits, and alternatives were carefully and thoroughly reviewed with the patient. Ample time for discussion and questions allowed. The patient understood, was satisfied, and agreed to proceed.     HPI: David Esparza is a 72 y.o. male who presents for EGD for EV screening due to cirrhosis, splenomegaly, mild ascites, and esophageal varices noted on MRI/MRCP. History of hepatic steatosis along with thrombocytopenia, INR 1.4  Past Medical History:  Diagnosis Date   Abnormal LFTs 09/06/2011   Acute bronchitis 08/16/2010   Anxiety 09/06/2011   Chicken pox as a child   Cutaneous skin tags 07/27/2016   Diabetes mellitus without complication    Fatigue    Gout    Gout    Grief reaction 09/09/2012   Hyperglycemia 09/06/2011   Hyperlipidemia 09/06/2011   Hypertension    Insomnia 08/16/2010   Knee pain, acute 02/28/2012   Low testosterone 02/28/2012   Measles as a child   Mumps as a child   Nocturia 08/16/2010   Obese    Preventative health care 09/06/2011   Sleep apnea 09/06/2011   Unspecified vitamin D deficiency 02/28/2012    Past Surgical History:  Procedure Laterality Date   BACK SURGERY     COLONOSCOPY  09/16/10   Dr. Arlyce Dice: moderate diverticulosis sigmoid and descending colon, o/w normal: repeat 10 yrs.   intestines sewn  72 yrs old   shot once made 6 holes   TONSILLECTOMY  as a child   VASECTOMY  69 yrs old    Prior to Admission medications   Medication Sig Start Date End Date Taking? Authorizing Provider  amLODipine (NORVASC) 5 MG tablet Take 1 tablet (5 mg total) by mouth daily. 01/26/21   Sandford Craze, NP  atorvastatin (LIPITOR) 10 MG tablet Take 1 tablet (10  mg total) by mouth at bedtime. 01/26/21   Sandford Craze, NP  doxycycline (VIBRA-TABS) 100 MG tablet Take 1 tablet (100 mg total) by mouth 2 (two) times daily. Patient not taking: Reported on 04/29/2022 03/23/22   Sandford Craze, NP  losartan-hydrochlorothiazide (HYZAAR) 100-25 MG tablet Take 1 tablet by mouth daily. for blood pressure 01/26/21   Sandford Craze, NP  metFORMIN (GLUCOPHAGE) 1000 MG tablet Take 1 tablet (1,000 mg total) by mouth 2 (two) times daily with a meal. 11/01/18   Bradd Canary, MD  mupirocin ointment (BACTROBAN) 2 % Apply 1 Application topically 2 (two) times daily as needed. Patient not taking: Reported on 04/29/2022 03/23/22   Sandford Craze, NP    Current Outpatient Medications  Medication Sig Dispense Refill   amLODipine (NORVASC) 5 MG tablet Take 1 tablet (5 mg total) by mouth daily. 90 tablet 1   atorvastatin (LIPITOR) 10 MG tablet Take 1 tablet (10 mg total) by mouth at bedtime. 90 tablet 1   doxycycline (VIBRA-TABS) 100 MG tablet Take 1 tablet (100 mg total) by mouth 2 (two) times daily. (Patient not taking: Reported on 04/29/2022) 14 tablet 0   losartan-hydrochlorothiazide (HYZAAR) 100-25 MG tablet Take 1 tablet by mouth daily. for blood pressure 90 tablet 1   metFORMIN (GLUCOPHAGE) 1000 MG tablet Take 1 tablet (1,000 mg total) by mouth 2 (two) times daily with a meal. 180 tablet  1   mupirocin ointment (BACTROBAN) 2 % Apply 1 Application topically 2 (two) times daily as needed. (Patient not taking: Reported on 04/29/2022) 22 g 1   Current Facility-Administered Medications  Medication Dose Route Frequency Provider Last Rate Last Admin   0.9 %  sodium chloride infusion  500 mL Intravenous Once Janyia Guion V, DO        Allergies as of 04/29/2022   (No Known Allergies)    Family History  Problem Relation Age of Onset   Diabetes Mother        type 2   Esophageal cancer Maternal Grandmother    Cancer Maternal Grandmother        throat    Heart disease Maternal Grandfather    Diabetes Maternal Grandfather    Depression Daughter        anxiety   Graves' disease Daughter    Colon polyps Neg Hx    Colon cancer Neg Hx    Rectal cancer Neg Hx    Stomach cancer Neg Hx     Social History   Socioeconomic History   Marital status: Married    Spouse name: Not on file   Number of children: 3   Years of education: Not on file   Highest education level: Not on file  Occupational History   Occupation: driver/partime  Tobacco Use   Smoking status: Former    Types: Cigarettes    Quit date: 08/30/1968    Years since quitting: 53.6   Smokeless tobacco: Never   Tobacco comments:    smoked in high school  Vaping Use   Vaping Use: Never used  Substance and Sexual Activity   Alcohol use: No    Comment: very little   Drug use: No   Sexual activity: Yes    Partners: Female  Other Topics Concern   Not on file  Social History Narrative   Not on file   Social Determinants of Health   Financial Resource Strain: Not on file  Food Insecurity: No Food Insecurity (03/29/2022)   Hunger Vital Sign    Worried About Running Out of Food in the Last Year: Never true    Ran Out of Food in the Last Year: Never true  Transportation Needs: No Transportation Needs (03/29/2022)   PRAPARE - Administrator, Civil Service (Medical): No    Lack of Transportation (Non-Medical): No  Physical Activity: Not on file  Stress: Not on file  Social Connections: Not on file  Intimate Partner Violence: Not At Risk (03/29/2022)   Humiliation, Afraid, Rape, and Kick questionnaire    Fear of Current or Ex-Partner: No    Emotionally Abused: No    Physically Abused: No    Sexually Abused: No    Physical Exam: Vital signs in last 24 hours:  (!) 145/82   Pulse 90   Temp 98.7 F (37.1 C)   Ht  (1.651 m)   Wt 241 lb (109.3 kg)   SpO2 95%   BMI 40.10 kg/m  GEN: NAD EYE: Sclerae anicteric ENT: MMM CV: Non-tachycardic Pulm: CTA  b/l GI: Soft, NT/ND NEURO:  Alert & Oriented x 3   Doristine Locks, DO [facility]   04/29/2022 2:59 PM

## 2022-04-29 NOTE — Progress Notes (Signed)
Uneventful anesthetic. Report to pacu rn. Vss. Care resumed by rn. 

## 2022-04-29 NOTE — Op Note (Signed)
[facility] Patient Name: David Esparza Procedure Date: 04/29/2022 3:00 PM MRN: [id number] Endoscopist: Doristine Locks , MD, [id number] Age: 72 Referring MD:  Date of Birth: 02/27/1950 Gender: Male Account #: [id number] Procedure:                Upper GI endoscopy Indications:              Cirrhosis rule out esophageal varices, Abnormal MRI                            of the GI tract                           72 y.o. male who presents for EGD for EV screening                            due to NASH cirrhosis, splenomegaly, mild ascites,                            and esophageal varices noted on MRI/MRCP. History                            of hepatic steatosis along with thrombocytopenia,                            INR 1.4 Medicines:                Monitored Anesthesia Care Procedure:                Pre-Anesthesia Assessment:                           - Prior to the procedure, a History and Physical                            was performed, and patient medications and                            allergies were reviewed. The patient's tolerance of                            previous anesthesia was also reviewed. The risks                            and benefits of the procedure and the sedation                            options and risks were discussed with the patient.                            All questions were answered, and informed consent                            was obtained. Prior Anticoagulants: The patient has  taken no anticoagulant or antiplatelet agents. ASA                            Grade Assessment: III - A patient with severe                            systemic disease. After reviewing the risks and                            benefits, the patient was deemed in satisfactory                            condition to undergo the procedure.                           After obtaining informed consent, the endoscope was                             passed under direct vision. Throughout the                            procedure, the patient's blood pressure, pulse, and                            oxygen saturations were monitored continuously. The                            Olympus Scope [id number] was introduced through the                            mouth, and advanced to the second part of duodenum.                            The upper GI endoscopy was accomplished without                            difficulty. The patient tolerated the procedure                            well. Scope In: Scope Out: Findings:                 Grade II varices were found in the middle third of                            the esophagus and in the lower third of the                            esophagus. They were 6 mm in largest diameter.                           Moderate portal hypertensive gastropathy was found  in the entire examined stomach. Biopsies were taken                            with a cold forceps for Helicobacter pylori                            testing. Estimated blood loss was minimal.                           The examined duodenum was normal. Complications:            No immediate complications. Estimated Blood Loss:     Estimated blood loss was minimal. Impression:               - Grade II esophageal varices.                           - Portal hypertensive gastropathy. Biopsied.                           - Normal examined duodenum. Recommendation:           - Patient has a contact number available for                            emergencies. The signs and symptoms of potential                            delayed complications were discussed with the                            patient. Return to normal activities tomorrow.                            Written discharge instructions were provided to the                            patient.                           - Resume previous diet.                            - Continue present medications.                           - Await pathology results.                           - Start Coreg 6.25 mg daily. Return to [facility] office                            or GI clinic in 5-7 days for BP check, and if able,                            will increase to 12.5 mg daily.                           -  If maintaining BB therapy, will not necessarily                            need repeat EGD for variceal surveillance. If                            intolerant to BB, may consider variceal ligation vs                            surveillance. Will discuss these options at                            follow-up and depending on tolerance to BB therapy.                           - Return to GI clinic at appointment to be                            scheduled. Doristine Locks, MD 04/29/2022 3:27:54 PM

## 2022-04-29 NOTE — Progress Notes (Signed)
Called to room to assist during endoscopic procedure.  Patient ID and intended procedure confirmed with present staff. Received instructions for my participation in the procedure from the performing physician.  

## 2022-04-29 NOTE — Progress Notes (Signed)
VS by AS  Pt's states no medical or surgical changes since previsit or office visit.  

## 2022-05-02 ENCOUNTER — Telehealth: Payer: Self-pay

## 2022-05-02 NOTE — Telephone Encounter (Signed)
Left message on answering machine. 

## 2022-05-03 ENCOUNTER — Encounter: Payer: Self-pay | Admitting: Gastroenterology

## 2022-05-03 ENCOUNTER — Ambulatory Visit (INDEPENDENT_AMBULATORY_CARE_PROVIDER_SITE_OTHER): Payer: BC Managed Care – PPO | Admitting: Gastroenterology

## 2022-05-03 VITALS — BP 122/68 | HR 78 | Ht 65.0 in | Wt 250.0 lb

## 2022-05-03 DIAGNOSIS — K7581 Nonalcoholic steatohepatitis (NASH): Secondary | ICD-10-CM

## 2022-05-03 DIAGNOSIS — K7460 Unspecified cirrhosis of liver: Secondary | ICD-10-CM | POA: Diagnosis not present

## 2022-05-03 DIAGNOSIS — Z23 Encounter for immunization: Secondary | ICD-10-CM | POA: Diagnosis not present

## 2022-05-03 DIAGNOSIS — R188 Other ascites: Secondary | ICD-10-CM | POA: Diagnosis not present

## 2022-05-03 DIAGNOSIS — I8510 Secondary esophageal varices without bleeding: Secondary | ICD-10-CM

## 2022-05-03 DIAGNOSIS — Z86010 Personal history of colonic polyps: Secondary | ICD-10-CM

## 2022-05-03 DIAGNOSIS — R17 Unspecified jaundice: Secondary | ICD-10-CM

## 2022-05-03 DIAGNOSIS — D696 Thrombocytopenia, unspecified: Secondary | ICD-10-CM

## 2022-05-03 DIAGNOSIS — R7989 Other specified abnormal findings of blood chemistry: Secondary | ICD-10-CM | POA: Diagnosis not present

## 2022-05-03 NOTE — Progress Notes (Signed)
Chief Complaint:    NASH cirrhosis, EGD follow-up  GI History: 71 year old male with history of diabetes, HTN, HLD, gout, obesity (BMI 39), OSA, diverticulosis, Bell's palsy, follows in the GI clinic for NASH cirrhosis.  Cirrhosis Evaluation: - Etiology: NASH - Complications: Esophageal varices, thrombocytopenia, splenomegaly, radiographic ascites - HCC screening: UTD.  MRI/MRCP 03/14/2022 - Variceal screening: EGD 04/2022 - Serologic evaluation: Ferritin 471, iron saturation 71%.  Otherwise negative/normal serologic workup 03/2022. - Viral hepatitis vaccination: Starting hepatitis B vaccine series today - Flu vaccine: UTD - Liver biopsy: None - Medications: Coreg 6.25 mg daily - MELD: 14 - Child Pugh score: B (7 points)   -03/14/2022: MRI/MRCP with hepatic cirrhosis, portal venous hypertension (moderate splenomegaly, mild ascites, prominent esophageal varices), tiny gallstones without duct dilatation.  No hepatoma - 04/29/2022: EGD: Grade 2 esophageal varices in the middle/lower third of the esophagus, moderate portal hypertensive gastropathy, normal duodenum.  Started on beta-blocker   Endoscopic History: - 03/16/2021: Colonoscopy: 4 polyps in the ascending colon (path: SSP x 1), 3 polyps in the sigmoid/rectum (path: Hyperplastic polyps).  Left-sided diverticulosis.  Medium sized internal hemorrhoids.  Fair prep.  Recommended repeat in 2 years due to suboptimal prep. - 04/29/2022: EGD: Grade 2 esophageal varices in the middle/lower third of the esophagus, moderate portal hypertensive gastropathy, normal duodenum.     HPI:     Patient is a 72 y.o. male presenting to the Gastroenterology Clinic for follow-up.   Was started on Coreg 6.25 mg daily after the EGD last week, which she started taking 2 days ago.  BP today 122/68.  Path still pending from gastric biopsies from last weeks EGD.  He was evaluated in the Hematology Clinic on 03/29/2022 for thrombocytopenia, elevated ferritin (471),  iron saturation (71%).  HFE was previously ordered but not yet completed.  Has follow-up in the Hematology clinic next week.   Review of systems:     No chest pain, no SOB, no fevers, no urinary sx   Past Medical History:  Diagnosis Date   Abnormal LFTs 09/06/2011   Acute bronchitis 08/16/2010   Anxiety 09/06/2011   Chicken pox as a child   Cutaneous skin tags 07/27/2016   Diabetes mellitus without complication    Fatigue    Gout    Gout    Grief reaction 09/09/2012   Hyperglycemia 09/06/2011   Hyperlipidemia 09/06/2011   Hypertension    Insomnia 08/16/2010   Knee pain, acute 02/28/2012   Low testosterone 02/28/2012   Measles as a child   Mumps as a child   Nocturia 08/16/2010   Obese    Preventative health care 09/06/2011   Sleep apnea 09/06/2011   Unspecified vitamin D deficiency 02/28/2012    Patient's surgical history, family medical history, social history, medications and allergies were all reviewed in Epic    Current Outpatient Medications  Medication Sig Dispense Refill   amLODipine (NORVASC) 5 MG tablet Take 1 tablet (5 mg total) by mouth daily. 90 tablet 1   atorvastatin (LIPITOR) 10 MG tablet Take 1 tablet (10 mg total) by mouth at bedtime. 90 tablet 1   carvedilol (COREG) 6.25 MG tablet Take 1 tablet (6.25 mg total) by mouth daily. 30 tablet 0   doxycycline (VIBRA-TABS) 100 MG tablet Take 1 tablet (100 mg total) by mouth 2 (two) times daily. 14 tablet 0   losartan-hydrochlorothiazide (HYZAAR) 100-25 MG tablet Take 1 tablet by mouth daily. for blood pressure 90 tablet 1   metFORMIN (GLUCOPHAGE) 1000 MG tablet  Take 1 tablet (1,000 mg total) by mouth 2 (two) times daily with a meal. 180 tablet 1   mupirocin ointment (BACTROBAN) 2 % Apply 1 Application topically 2 (two) times daily as needed. 22 g 1   No current facility-administered medications for this visit.    Physical Exam:     BP 122/68   Pulse 78   Ht  (1.651 m)   Wt 250 lb (113.4 kg)   BMI  41.60 kg/m   GENERAL:  Pleasant male in NAD PSYCH: : Cooperative, normal affect NEURO: Alert and oriented x 3, right facial droop consistent with Bell's palsy.  Improved from previous.   IMPRESSION and PLAN:    1) NASH cirrhosis 2) Esophageal varices 3) Splenomegaly 4) Thrombocytopenia 5) Radiographic ascites  MELD 14 Child Pugh score B  Newly diagnosed NASH cirrhosis c/b portal hypertension (medium sized esophageal varices, splenomegaly, thrombocytopenia, ascites on imaging). Discussed the pathophysiology of cirrhosis at length today.  - Continue Coreg 6.25 mg daily - I asked him to message me in 1 week with BP and HR check, with plan to increase Coreg to 12.5 mg daily.  When increasing the Coreg, plan is to reduce the amlodipine to 2.5 mg daily - Follow-up with PCM for continued BP management after medication adjustments as above - Hepatitis B vaccine series initiated today in office.  Dose #2 scheduled - Hepatoma screening UTD.  Repeat imaging in 09/2022 - MELD 14.  Placed referral to [facility] - No clinical e/o hepatic encephalopathy - Nutrition- high-protein diet from primarily plant-based diet.  Avoid red meat.  No raw or undercooked meat, seafood, or shellfish.  Low-fat/cholesterol/carbohydrate diet.  Limit sodium to 2 g/day. - Continue daily multivitamin - Discussed possibly initiating GLP-1.  Would do in concert with his PCM - If tolerating beta-blocker without issue, will likely obviate the need for continued EGD for surveillance - Did discuss the possibility of TIPS based on response to beta-blocker and/or other cirrhosis issues  6) Elevated ferritin - Follow-up in the Hematology clinic as scheduled  7) History of colon polyps -Repeat colonoscopy in 2025 for ongoing polyp surveillance and due to suboptimal bowel prep on previous colonoscopy - Will need extended 2-day prep with repeat colonoscopy     I spent over 40 minutes of time, including in  depth chart review, independent review of results as outlined above, communicating results with the patient directly, face-to-face time with the patient, coordinating care, ordering studies and medications as appropriate, and documentation.   Shellia Cleverly ,DO, FACG 05/03/2022, 3:38 PM

## 2022-05-03 NOTE — Patient Instructions (Addendum)
You have been scheduled for 2nd Hep B injection with a Nurse on 06/03/22 at 1:30 PM . Please arrive 10 minutes early for your appointment.   You have been referred to [facility]. You will get a call from to schedule for consult.  You have been scheduled for an appointment with Dr. Barron Alvine on 08/02/22 at 3:00 PM . Please arrive 10 minutes early for your appointment.   Send [place] MyChart message in few weeks with an update of your blood pressure.   _______________________________________________________  If your blood pressure at your visit was 140/90 or greater, please contact your primary care physician to follow up on this.  _______________________________________________________  If you are age 73 or older, your body mass index should be between 23-30. Your Body mass index is 41.6 kg/m. If this is out of the aforementioned range listed, please consider follow up with your Primary Care Provider.  __________________________________________________________  The [facility] providers would like to encourage you to use [facility] to communicate with providers for non-urgent requests or questions.  Due to long hold times on the telephone, sending your provider a message by [facility] may be a faster and more efficient way to get a response.  Please allow 48 business hours for a response.  Please remember that this is for non-urgent requests.    Due to recent changes in healthcare laws, you may see the results of your imaging and laboratory studies on MyChart before your provider has had a chance to review them.  We understand that in some cases there may be results that are confusing or concerning to you. Not all laboratory results come back in the same time frame and the provider may be waiting for multiple results in order to interpret others.  Please give [place] 48 hours in order for your provider to thoroughly review all the results before contacting the office for clarification of your results.      Thank you for choosing me and [facility].  Vito Cirigliano, D.O.

## 2022-05-09 ENCOUNTER — Inpatient Hospital Stay (HOSPITAL_BASED_OUTPATIENT_CLINIC_OR_DEPARTMENT_OTHER): Payer: BC Managed Care – PPO | Admitting: Medical Oncology

## 2022-05-09 ENCOUNTER — Inpatient Hospital Stay: Payer: BC Managed Care – PPO | Attending: Family

## 2022-05-09 ENCOUNTER — Encounter: Payer: Self-pay | Admitting: Gastroenterology

## 2022-05-09 ENCOUNTER — Telehealth: Payer: Self-pay

## 2022-05-09 VITALS — BP 153/78 | HR 73 | Temp 97.8°F | Resp 19 | Wt 250.0 lb

## 2022-05-09 DIAGNOSIS — D696 Thrombocytopenia, unspecified: Secondary | ICD-10-CM

## 2022-05-09 DIAGNOSIS — R161 Splenomegaly, not elsewhere classified: Secondary | ICD-10-CM | POA: Diagnosis not present

## 2022-05-09 DIAGNOSIS — K766 Portal hypertension: Secondary | ICD-10-CM | POA: Diagnosis not present

## 2022-05-09 DIAGNOSIS — R188 Other ascites: Secondary | ICD-10-CM | POA: Insufficient documentation

## 2022-05-09 DIAGNOSIS — K7460 Unspecified cirrhosis of liver: Secondary | ICD-10-CM | POA: Insufficient documentation

## 2022-05-09 DIAGNOSIS — Z79899 Other long term (current) drug therapy: Secondary | ICD-10-CM | POA: Insufficient documentation

## 2022-05-09 DIAGNOSIS — Z87891 Personal history of nicotine dependence: Secondary | ICD-10-CM | POA: Diagnosis not present

## 2022-05-09 LAB — CBC WITH DIFFERENTIAL (CANCER CENTER ONLY)
Abs Immature Granulocytes: 0.02 10*3/uL (ref 0.00–0.07)
Basophils Absolute: 0.1 10*3/uL (ref 0.0–0.1)
Basophils Relative: 1 %
Eosinophils Absolute: 0.3 10*3/uL (ref 0.0–0.5)
Eosinophils Relative: 4 %
HCT: 42.4 % (ref 39.0–52.0)
Hemoglobin: 15.2 g/dL (ref 13.0–17.0)
Immature Granulocytes: 0 %
Lymphocytes Relative: 19 %
Lymphs Abs: 1.1 10*3/uL (ref 0.7–4.0)
MCH: 34.2 pg — ABNORMAL HIGH (ref 26.0–34.0)
MCHC: 35.8 g/dL (ref 30.0–36.0)
MCV: 95.3 fL (ref 80.0–100.0)
Monocytes Absolute: 0.6 10*3/uL (ref 0.1–1.0)
Monocytes Relative: 10 %
Neutro Abs: 3.9 10*3/uL (ref 1.7–7.7)
Neutrophils Relative %: 66 %
Platelet Count: 73 10*3/uL — ABNORMAL LOW (ref 150–400)
RBC: 4.45 MIL/uL (ref 4.22–5.81)
RDW: 13.5 % (ref 11.5–15.5)
WBC Count: 5.9 10*3/uL (ref 4.0–10.5)
nRBC: 0 % (ref 0.0–0.2)

## 2022-05-09 LAB — SAVE SMEAR(SSMR), FOR PROVIDER SLIDE REVIEW

## 2022-05-09 LAB — CMP (CANCER CENTER ONLY)
ALT: 29 U/L (ref 0–44)
AST: 46 U/L — ABNORMAL HIGH (ref 15–41)
Albumin: 3.2 g/dL — ABNORMAL LOW (ref 3.5–5.0)
Alkaline Phosphatase: 116 U/L (ref 38–126)
Anion gap: 5 (ref 5–15)
BUN: 17 mg/dL (ref 8–23)
CO2: 25 mmol/L (ref 22–32)
Calcium: 8.6 mg/dL — ABNORMAL LOW (ref 8.9–10.3)
Chloride: 108 mmol/L (ref 98–111)
Creatinine: 0.96 mg/dL (ref 0.61–1.24)
GFR, Estimated: >60 mL/min (ref >60)
Glucose, Bld: 119 mg/dL — ABNORMAL HIGH (ref 70–99)
Potassium: 4.1 mmol/L (ref 3.5–5.1)
Sodium: 138 mmol/L (ref 135–145)
Total Bilirubin: 1.8 mg/dL — ABNORMAL HIGH (ref 0.3–1.2)
Total Protein: 6.6 g/dL (ref 6.5–8.1)

## 2022-05-09 LAB — LACTATE DEHYDROGENASE: LDH: 245 U/L — ABNORMAL HIGH (ref 98–192)

## 2022-05-09 NOTE — Telephone Encounter (Signed)
Rec'd fax confirmation from [facility] that patient has an appointment on 07-25-22 at 2:15pm.

## 2022-05-09 NOTE — Progress Notes (Signed)
Hematology/Oncology Consultation   Name: [facility]      MRN: [id number]    Location: Room/bed info not found  Date: 05/09/2022 Time:12:55 PM   REFERRING PHYSICIAN:  Sandford Craze, NP  REASON FOR CONSULT:  Thrombocytopenia    DIAGNOSIS: Thrombocytopenia with NASH  HISTORY OF PRESENT ILLNESS:  David [facility]za is a pleasant 72 yo caucasian gentleman with thrombocytopenia noted over the last 5 years (since 07/05/2016).  He has history of cirrhosis with NASH.  MRI of the abdomen with MRCP on 03/14/2022 showed the cirrhosis, portal venous hypertension with moderate splenomegaly, mild ascites and prominent esophageal varices. They also noted several tiny gallstones without evidence of cholecystitis.  Had an unremarkable EGD on 04/29/2022.  He had colonoscopy on 03/16/2021 with [facility]. He had a total of 7 benign polyps removed and was noted to have internal and external hemorrhoids and diverticulosis in the sigmoid and descending colon.   Interval History:  He reports that he has been well since his last visit. He continues to see GI in terms of his NASH. No major changes there.   At his last visit he was considering a vegetarian or vegan diet but is not doing this now.   No bleeding episodes, bleeding of the gums, hematuria, bloody movements, hemoptysis, or excessive bruising.   Wt Readings from Last 3 Encounters:  05/03/22 250 lb (113.4 kg)  04/29/22 241 lb (109.3 kg)  03/29/22 244 lb 6.4 oz (110.9 kg)      ROS: All other 10 point review of systems is negative.   PAST MEDICAL HISTORY:   Past Medical History:  Diagnosis Date   Abnormal LFTs 09/06/2011   Acute bronchitis 08/16/2010   Anxiety 09/06/2011   Chicken pox as a child   Cutaneous skin tags 07/27/2016   Diabetes mellitus without complication (HCC)    Fatigue    Gout    Gout    Grief reaction 09/09/2012   Hyperglycemia 09/06/2011   Hyperlipidemia 09/06/2011   Hypertension    Insomnia 08/16/2010   Knee pain, acute  02/28/2012   Low testosterone 02/28/2012   Measles as a child   Mumps as a child   Nocturia 08/16/2010   Obese    Preventative health care 09/06/2011   Sleep apnea 09/06/2011   Unspecified vitamin D deficiency 02/28/2012    ALLERGIES: No Known Allergies    MEDICATIONS:  Current Outpatient Medications on File Prior to Visit  Medication Sig Dispense Refill   amLODipine (NORVASC) 5 MG tablet Take 1 tablet (5 mg total) by mouth daily. 90 tablet 1   atorvastatin (LIPITOR) 10 MG tablet Take 1 tablet (10 mg total) by mouth at bedtime. 90 tablet 1   carvedilol (COREG) 6.25 MG tablet Take 1 tablet (6.25 mg total) by mouth daily. 30 tablet 0   doxycycline (VIBRA-TABS) 100 MG tablet Take 1 tablet (100 mg total) by mouth 2 (two) times daily. 14 tablet 0   losartan-hydrochlorothiazide (HYZAAR) 100-25 MG tablet Take 1 tablet by mouth daily. for blood pressure 90 tablet 1   metFORMIN (GLUCOPHAGE) 1000 MG tablet Take 1 tablet (1,000 mg total) by mouth 2 (two) times daily with a meal. 180 tablet 1   mupirocin ointment (BACTROBAN) 2 % Apply 1 Application topically 2 (two) times daily as needed. 22 g 1   No current facility-administered medications on file prior to visit.     PAST SURGICAL HISTORY Past Surgical History:  Procedure Laterality Date   BACK SURGERY  COLONOSCOPY  09/16/10   [facility]: moderate diverticulosis sigmoid and descending colon, o/w normal: repeat 10 yrs.   intestines sewn  72 yrs old   shot once made 6 holes   TONSILLECTOMY  as a child   VASECTOMY  48 yrs old    FAMILY HISTORY: Family History  Problem Relation Age of Onset   Diabetes Mother        type 2   Esophageal cancer Maternal Grandmother    Cancer Maternal Grandmother        throat   Heart disease Maternal Grandfather    Diabetes Maternal Grandfather    Depression Daughter        anxiety   Graves' disease Daughter    Colon polyps Neg Hx    Colon cancer Neg Hx    Rectal cancer Neg Hx    Stomach  cancer Neg Hx     SOCIAL HISTORY:  reports that he quit smoking about 53 years ago. His smoking use included cigarettes. He has never used smokeless tobacco. He reports that he does not drink alcohol and does not use drugs.  PERFORMANCE STATUS: The patient's performance status is 0 - Asymptomatic  PHYSICAL EXAM: Most Recent Vital Signs: There were no vitals taken for this visit. There were no vitals taken for this visit.  General Appearance:    Alert, cooperative, no distress, appears stated age  Head:    Normocephalic, without obvious abnormality, atraumatic  Eyes:    PERRL, conjunctiva/corneas clear, EOM's intact, fundi    benign, both eyes             Throat:   Lips, mucosa, and tongue normal; teeth and gums normal  Neck:   Supple, symmetrical, trachea midline, no adenopathy;       thyroid:  No enlargement/tenderness/nodules; no carotid   bruit or JVD  Back:     Symmetric, no curvature, ROM normal, no CVA tenderness  Lungs:     Clear to auscultation bilaterally, respirations unlabored  Chest wall:    No tenderness or deformity  Heart:    Regular rate and rhythm, S1 and S2 normal, no murmur, rub   or gallop  Abdomen:     Soft, non-tender, bowel sounds active all four quadrants,    no masses, no organomegaly        Extremities:   Extremities normal, atraumatic, no cyanosis or edema     Skin:   Skin color, texture, turgor normal, no rashes or lesions  Lymph nodes:   Cervical, supraclavicular, and axillary nodes normal  Neurologic:   CNII-XII appear intact. Normal strength, sensation and reflexes      throughout    LABORATORY DATA:  Results for orders placed or performed in visit on 05/09/22 (from the past 48 hour(s))  Save Smear for Provider Slide Review     Status: None   Collection Time: 05/09/22 12:03 PM  Result Value Ref Range   Smear Review SMEAR STAINED AND AVAILABLE FOR REVIEW     Comment: Performed at [facility], [address], [place], [place] [address]  CMP (Cancer Center only)     Status: Abnormal   Collection Time: 05/09/22 12:03 PM  Result Value Ref Range   Sodium 138 135 - 145 mmol/L   Potassium 4.1 3.5 - 5.1 mmol/L   Chloride 108 98 - 111 mmol/L   CO2 25 22 - 32 mmol/L   Glucose, Bld 119 (H) 70 -  99 mg/dL    Comment: Glucose reference range applies only to samples taken after fasting for at least 8 hours.   BUN 17 8 - 23 mg/dL   Creatinine 1.30 8.65 - 1.24 mg/dL   Calcium 8.6 (L) 8.9 - 10.3 mg/dL   Total Protein 6.6 6.5 - 8.1 g/dL   Albumin 3.2 (L) 3.5 - 5.0 g/dL   AST 46 (H) 15 - 41 U/L   ALT 29 0 - 44 U/L   Alkaline Phosphatase 116 38 - 126 U/L   Total Bilirubin 1.8 (H) 0.3 - 1.2 mg/dL   GFR, Estimated >78 >46 mL/min    Comment: (NOTE) Calculated using the CKD-EPI Creatinine Equation (2021)    Anion gap 5 5 - 15    Comment: Performed at [facility] at [facility], [address], [place], [place] [address]  CBC with Differential (Cancer Center Only)     Status: Abnormal   Collection Time: 05/09/22 12:03 PM  Result Value Ref Range   WBC Count 5.9 4.0 - 10.5 K/uL   RBC 4.45 4.22 - 5.81 MIL/uL   Hemoglobin 15.2 13.0 - 17.0 g/dL   HCT 28.4 13.2 - 44.0 %   MCV 95.3 80.0 - 100.0 fL   MCH 34.2 (H) 26.0 - 34.0 pg   MCHC 35.8 30.0 - 36.0 g/dL   RDW 10.2 72.5 - 36.6 %   Platelet Count 73 (L) 150 - 400 K/uL    Comment: REPEATED TO VERIFY   nRBC 0.0 0.0 - 0.2 %   Neutrophils Relative % 66 %   Neutro Abs 3.9 1.7 - 7.7 K/uL   Lymphocytes Relative 19 %   Lymphs Abs 1.1 0.7 - 4.0 K/uL   Monocytes Relative 10 %   Monocytes Absolute 0.6 0.1 - 1.0 K/uL   Eosinophils Relative 4 %   Eosinophils Absolute 0.3 0.0 - 0.5 K/uL   Basophils Relative 1 %   Basophils Absolute 0.1 0.0 - 0.1 K/uL   Immature Granulocytes 0 %   Abs Immature Granulocytes 0.02 0.00 - 0.07 K/uL    Comment: Performed at [facility], [address] [place], [place] [address]   ASSESSMENT/PLAN: Mr. Harvill is a pleasant 72 yo caucasian gentleman with thrombocytopenia noted over the last 5 years (since 07/05/2016) with NASH.   He is followed by GI which I have encouraged him to continue.   Overall his counts look stable to improved. His platelet count has risen from 68 to 73. Hgb, WBC normal. We will plan to continue monitoring him every 6-8 weeks.   All questions were answered. The patient knows to call the clinic with any problems, questions or concerns. We can certainly see the patient much sooner if necessary.  Rushie Chestnut, PA-C

## 2022-05-10 ENCOUNTER — Ambulatory Visit: Payer: Medicare Other | Admitting: Hematology & Oncology

## 2022-05-10 ENCOUNTER — Inpatient Hospital Stay: Payer: BC Managed Care – PPO

## 2022-05-12 ENCOUNTER — Telehealth: Payer: Self-pay | Admitting: *Deleted

## 2022-05-12 NOTE — Telephone Encounter (Signed)
Called patient and lvm of upcoming appoints, requested call back to confirm.

## 2022-05-23 ENCOUNTER — Other Ambulatory Visit: Payer: Self-pay | Admitting: Gastroenterology

## 2022-05-24 ENCOUNTER — Other Ambulatory Visit: Payer: Self-pay

## 2022-05-24 MED ORDER — CARVEDILOL 12.5 MG PO TABS: 12.5000 mg | ORAL_TABLET | Freq: Every day | ORAL | 5 refills | Status: DC

## 2022-06-03 ENCOUNTER — Ambulatory Visit: Payer: Medicare Other

## 2022-06-08 ENCOUNTER — Inpatient Hospital Stay: Payer: BC Managed Care – PPO | Attending: Family

## 2022-06-08 ENCOUNTER — Inpatient Hospital Stay: Payer: BC Managed Care – PPO | Admitting: Family

## 2022-06-20 ENCOUNTER — Ambulatory Visit (INDEPENDENT_AMBULATORY_CARE_PROVIDER_SITE_OTHER): Payer: BC Managed Care – PPO | Admitting: Gastroenterology

## 2022-06-20 DIAGNOSIS — Z23 Encounter for immunization: Secondary | ICD-10-CM

## 2022-06-20 NOTE — Progress Notes (Signed)
Presents for hepatitis vaccine.  Please see nursing notes.

## 2022-07-25 ENCOUNTER — Other Ambulatory Visit: Payer: Self-pay | Admitting: Nurse Practitioner

## 2022-07-25 DIAGNOSIS — R799 Abnormal finding of blood chemistry, unspecified: Secondary | ICD-10-CM | POA: Diagnosis not present

## 2022-07-25 DIAGNOSIS — R7309 Other abnormal glucose: Secondary | ICD-10-CM | POA: Diagnosis not present

## 2022-07-25 DIAGNOSIS — R7989 Other specified abnormal findings of blood chemistry: Secondary | ICD-10-CM | POA: Diagnosis not present

## 2022-07-25 DIAGNOSIS — K766 Portal hypertension: Secondary | ICD-10-CM | POA: Diagnosis not present

## 2022-07-25 DIAGNOSIS — K7581 Nonalcoholic steatohepatitis (NASH): Secondary | ICD-10-CM

## 2022-07-25 DIAGNOSIS — R791 Abnormal coagulation profile: Secondary | ICD-10-CM | POA: Diagnosis not present

## 2022-07-25 DIAGNOSIS — K7460 Unspecified cirrhosis of liver: Secondary | ICD-10-CM | POA: Diagnosis not present

## 2022-07-25 DIAGNOSIS — I8510 Secondary esophageal varices without bleeding: Secondary | ICD-10-CM | POA: Diagnosis not present

## 2022-07-25 DIAGNOSIS — E1169 Type 2 diabetes mellitus with other specified complication: Secondary | ICD-10-CM | POA: Diagnosis not present

## 2022-07-25 DIAGNOSIS — K3189 Other diseases of stomach and duodenum: Secondary | ICD-10-CM

## 2022-07-25 DIAGNOSIS — R188 Other ascites: Secondary | ICD-10-CM

## 2022-07-25 DIAGNOSIS — R772 Abnormality of alphafetoprotein: Secondary | ICD-10-CM | POA: Diagnosis not present

## 2022-08-02 ENCOUNTER — Ambulatory Visit: Payer: Medicare Other | Admitting: Gastroenterology

## 2022-08-04 ENCOUNTER — Ambulatory Visit
Admission: RE | Admit: 2022-08-04 | Discharge: 2022-08-04 | Disposition: A | Discharge status: Home / Self Care | Payer: BC Managed Care – PPO | Source: Ambulatory Visit | Attending: Nurse Practitioner | Admitting: Nurse Practitioner

## 2022-08-04 DIAGNOSIS — K7581 Nonalcoholic steatohepatitis (NASH): Secondary | ICD-10-CM

## 2022-08-04 DIAGNOSIS — K766 Portal hypertension: Secondary | ICD-10-CM | POA: Diagnosis not present

## 2022-08-04 DIAGNOSIS — R7989 Other specified abnormal findings of blood chemistry: Secondary | ICD-10-CM

## 2022-08-04 DIAGNOSIS — K3189 Other diseases of stomach and duodenum: Secondary | ICD-10-CM

## 2022-08-04 DIAGNOSIS — I8510 Secondary esophageal varices without bleeding: Secondary | ICD-10-CM

## 2022-08-04 DIAGNOSIS — R188 Other ascites: Secondary | ICD-10-CM

## 2022-08-04 DIAGNOSIS — K7460 Unspecified cirrhosis of liver: Secondary | ICD-10-CM | POA: Diagnosis not present

## 2022-08-30 ENCOUNTER — Ambulatory Visit (INDEPENDENT_AMBULATORY_CARE_PROVIDER_SITE_OTHER): Payer: BC Managed Care – PPO | Admitting: Gastroenterology

## 2022-08-30 ENCOUNTER — Encounter: Payer: Self-pay | Admitting: Gastroenterology

## 2022-08-30 VITALS — BP 118/72 | Ht 65.0 in | Wt 249.0 lb

## 2022-08-30 DIAGNOSIS — Z86010 Personal history of colonic polyps: Secondary | ICD-10-CM

## 2022-08-30 DIAGNOSIS — D696 Thrombocytopenia, unspecified: Secondary | ICD-10-CM | POA: Diagnosis not present

## 2022-08-30 DIAGNOSIS — K766 Portal hypertension: Secondary | ICD-10-CM

## 2022-08-30 DIAGNOSIS — I8510 Secondary esophageal varices without bleeding: Secondary | ICD-10-CM | POA: Diagnosis not present

## 2022-08-30 DIAGNOSIS — K7581 Nonalcoholic steatohepatitis (NASH): Secondary | ICD-10-CM | POA: Diagnosis not present

## 2022-08-30 DIAGNOSIS — R7989 Other specified abnormal findings of blood chemistry: Secondary | ICD-10-CM

## 2022-08-30 DIAGNOSIS — K3189 Other diseases of stomach and duodenum: Secondary | ICD-10-CM

## 2022-08-30 DIAGNOSIS — K7460 Unspecified cirrhosis of liver: Secondary | ICD-10-CM

## 2022-08-30 DIAGNOSIS — R195 Other fecal abnormalities: Secondary | ICD-10-CM

## 2022-08-30 NOTE — Progress Notes (Signed)
Chief Complaint:    MASH cirrhosis  GI History: 72 year old male with history of diabetes, HTN, HLD, gout, obesity (BMI 39), OSA, diverticulosis, Bell's palsy, follows in the GI clinic for MASH cirrhosis.   Cirrhosis Evaluation: - Etiology: MASH - Complications: Esophageal varices, thrombocytopenia, splenomegaly, radiographic ascites - HCC screening: UTD.  MRI/MRCP 03/14/2022, RUQ [place] and AFP 07/2022 - Variceal screening: EGD 04/2022 - Serologic evaluation: Ferritin 471, iron saturation 71%.  Otherwise negative/normal serologic workup 03/2022. HFE negative 07/2022 - Viral hepatitis vaccination: Started hepatitis B vaccine series 2024 - Flu vaccine: UTD - Liver biopsy: None - Medications: Coreg 12.5 mg daily - MELD: 12 - Child Pugh score: B (7 points)     -03/14/2022: MRI/MRCP with hepatic cirrhosis, portal venous hypertension (moderate splenomegaly, mild ascites, prominent esophageal varices), tiny gallstones without duct dilatation.  No hepatoma - 04/29/2022: EGD: Grade 2 esophageal varices in the middle/lower third of the esophagus, moderate portal hypertensive gastropathy, normal duodenum.  Started on beta-blocker     Endoscopic History: - 03/16/2021: Colonoscopy: 4 polyps in the ascending colon (path: SSP x 1), 3 polyps in the sigmoid/rectum (path: Hyperplastic polyps).  Left-sided diverticulosis.  Medium sized internal hemorrhoids.  Fair prep.  Recommended repeat in 2 years due to suboptimal prep. - 04/29/2022: EGD: Grade 2 esophageal varices in the middle/lower third of the esophagus, moderate portal hypertensive gastropathy, normal duodenum.      HPI:     Patient is a 72 y.o. male presenting to the Gastroenterology Clinic for routine follow-up.  Was last seen by me on 05/03/2022.  Increased Coreg to 12.5 mg daily and reduced amlodipine to 2.5 mg daily (and ha since stopped completely).  Placed referral to [facility] for evaluation.  No acute issues since last appointment.   Otherwise feeling well today and no complaints.  Was seen in the Hematology Clinic on 05/09/2022 for continued evaluation of thrombocytopenia.  Elevated ferritin and iron saturation not evaluated.   Was seen in the [facility] on 07/25/2022.  No current indication for OLT.  Ordered RUQ [place] and AFP for hepatoma screening.  Ordered HFE (negative) with plan for follow-up in 6 months. Recommended blood donation for elevated iron.   - 08/04/2022: RUQ [place]: Cirrhotic appearing liver, no HCC.  Portal venous hypertension with splenomegaly and trace ascites, stable from previous MRI.  Does continue to have intermittent fecal urgency.  No frank diarrhea.  No hematochezia.  No abdominal pain.   Review of systems:     No chest pain, no SOB, no fevers, no urinary sx   Past Medical History:  Diagnosis Date   Abnormal LFTs 09/06/2011   Acute bronchitis 08/16/2010   Anxiety 09/06/2011   Chicken pox as a child   Cutaneous skin tags 07/27/2016   Diabetes mellitus without complication (HCC)    Fatigue    Gout    Gout    Grief reaction 09/09/2012   Hyperglycemia 09/06/2011   Hyperlipidemia 09/06/2011   Hypertension    Insomnia 08/16/2010   Knee pain, acute 02/28/2012   Low testosterone 02/28/2012   Measles as a child   Mumps as a child   Nocturia 08/16/2010   Obese    Preventative health care 09/06/2011   Sleep apnea 09/06/2011   Unspecified vitamin D deficiency 02/28/2012    Patient's surgical history, family medical history, social history, medications and allergies were all reviewed in Epic    Current Outpatient Medications  Medication Sig Dispense Refill   amLODipine (NORVASC) 5  MG tablet Take 1 tablet (5 mg total) by mouth daily. 90 tablet 1   atorvastatin (LIPITOR) 10 MG tablet Take 1 tablet (10 mg total) by mouth at bedtime. 90 tablet 1   carvedilol (COREG) 12.5 MG tablet Take 1 tablet (12.5 mg total) by mouth daily. 90 tablet 5   doxycycline (VIBRA-TABS) 100 MG tablet Take  1 tablet (100 mg total) by mouth 2 (two) times daily. 14 tablet 0   losartan-hydrochlorothiazide (HYZAAR) 100-25 MG tablet Take 1 tablet by mouth daily. for blood pressure 90 tablet 1   metFORMIN (GLUCOPHAGE) 1000 MG tablet Take 1 tablet (1,000 mg total) by mouth 2 (two) times daily with a meal. 180 tablet 1   mupirocin ointment (BACTROBAN) 2 % Apply 1 Application topically 2 (two) times daily as needed. 22 g 1   No current facility-administered medications for this visit.    Physical Exam:     BP 118/72   Ht 5\' 5"  (1.651 m)   Wt 249 lb (112.9 kg)   BMI 41.44 kg/m   GENERAL:  Pleasant male in NAD PSYCH: : Cooperative, normal affect Musculoskeletal:  Normal muscle tone, normal strength NEURO: Alert and oriented x 3, no focal neurologic deficits   IMPRESSION and PLAN:    1) NASH cirrhosis 2) Esophageal varices 3) Splenomegaly 4) Thrombocytopenia 5) Radiographic ascites   MELD 12 Child Pugh score B   72 year old male with MASH cirrhosis c/b portal hypertension, medium sized esophageal varices, splenomegaly, thrombocytopenia, ascites on imaging.    - Continue Coreg 12.5 mg daily - Complete Hepatitis B vaccine series as scheduled - Hepatoma screening UTD.  Repeat imaging in 01/2023 - Follow-up with [facility] in 01/2023 as scheduled.  - No clinical e/o hepatic encephalopathy - Nutrition- high-protein diet from primarily plant-based diet.  Avoid red meat.  No raw or undercooked meat, seafood, or shellfish.  Low-fat/cholesterol/carbohydrate diet.  Limit sodium to 2 g/day. - Continue daily multivitamin - Previously discussed role/utility of initiating GLP-1.  Would do in concert with his PCM - No need for continued EGD for EV surveillance since he is on beta-blocker   6) Elevated ferritin - HFE negative - He will contact the Hematology Clinic to arrange for follow-up and blood donation/phlebotomy   7) History of colon polyps -Repeat colonoscopy in 2025 for  ongoing polyp surveillance and due to suboptimal bowel prep on previous colonoscopy - Will need extended 2-day prep with repeat colonoscopy  8) Change in bowel habits - Trial Benefiber daily   RTC 1 year       Shellia Cleverly ,DO, FACG 08/30/2022, 9:55 AM

## 2022-08-30 NOTE — Patient Instructions (Signed)
_______________________________________________________  If your blood pressure at your visit was 140/90 or greater, please contact your primary care physician to follow up on this.  _______________________________________________________  If you are age 73 or older, your body mass index should be between 23-30. Your Body mass index is 41.44 kg/m. If this is out of the aforementioned range listed, please consider follow up with your Primary Care Provider.  If you are age 60 or younger, your body mass index should be between 19-25. Your Body mass index is 41.44 kg/m. If this is out of the aformentioned range listed, please consider follow up with your Primary Care Provider.   ________________________________________________________  The [facility] providers would like to encourage you to use [facility] to communicate with providers for non-urgent requests or questions.  Due to long hold times on the telephone, sending your provider a message by [facility] may be a faster and more efficient way to get a response.  Please allow 48 business hours for a response.  Please remember that this is for non-urgent requests.  _______________________________________________________  Please follow up in 12 months. Give [place] a call at [phone] to schedule an appointment.  Please purchase the following medications over the counter and take as directed: Benefiber as directed  It was a pleasure to see you today!  Vito Cirigliano, D.O.

## 2022-10-23 ENCOUNTER — Emergency Department (HOSPITAL_BASED_OUTPATIENT_CLINIC_OR_DEPARTMENT_OTHER): Payer: BC Managed Care – PPO

## 2022-10-23 ENCOUNTER — Emergency Department (HOSPITAL_BASED_OUTPATIENT_CLINIC_OR_DEPARTMENT_OTHER)
Admission: EM | Admit: 2022-10-23 | Discharge: 2022-10-24 | Disposition: A | Discharge status: Home / Self Care | Payer: BC Managed Care – PPO | Attending: Emergency Medicine | Admitting: Emergency Medicine

## 2022-10-23 ENCOUNTER — Encounter (HOSPITAL_BASED_OUTPATIENT_CLINIC_OR_DEPARTMENT_OTHER): Payer: Self-pay

## 2022-10-23 DIAGNOSIS — R0602 Shortness of breath: Secondary | ICD-10-CM | POA: Insufficient documentation

## 2022-10-23 DIAGNOSIS — R0989 Other specified symptoms and signs involving the circulatory and respiratory systems: Secondary | ICD-10-CM | POA: Diagnosis not present

## 2022-10-23 DIAGNOSIS — Z7984 Long term (current) use of oral hypoglycemic drugs: Secondary | ICD-10-CM | POA: Insufficient documentation

## 2022-10-23 DIAGNOSIS — R059 Cough, unspecified: Secondary | ICD-10-CM | POA: Diagnosis not present

## 2022-10-23 DIAGNOSIS — Z20822 Contact with and (suspected) exposure to covid-19: Secondary | ICD-10-CM | POA: Diagnosis not present

## 2022-10-23 DIAGNOSIS — I10 Essential (primary) hypertension: Secondary | ICD-10-CM | POA: Diagnosis not present

## 2022-10-23 DIAGNOSIS — J9811 Atelectasis: Secondary | ICD-10-CM | POA: Diagnosis not present

## 2022-10-23 DIAGNOSIS — Z79899 Other long term (current) drug therapy: Secondary | ICD-10-CM | POA: Diagnosis not present

## 2022-10-23 DIAGNOSIS — Z1152 Encounter for screening for COVID-19: Secondary | ICD-10-CM | POA: Diagnosis not present

## 2022-10-23 DIAGNOSIS — J040 Acute laryngitis: Secondary | ICD-10-CM | POA: Diagnosis not present

## 2022-10-23 DIAGNOSIS — E119 Type 2 diabetes mellitus without complications: Secondary | ICD-10-CM | POA: Diagnosis not present

## 2022-10-23 NOTE — ED Triage Notes (Signed)
Patient reports sore throat that started a few days ago. Patient says it hurts when he swallow and he thinks he has laryngitis. Patient also has a cough.

## 2022-10-24 ENCOUNTER — Other Ambulatory Visit: Payer: Self-pay

## 2022-10-24 DIAGNOSIS — J040 Acute laryngitis: Secondary | ICD-10-CM | POA: Diagnosis not present

## 2022-10-24 LAB — RESP PANEL BY RT-PCR (RSV, FLU A&B, COVID)  RVPGX2
Influenza A by PCR: NEGATIVE
Influenza B by PCR: NEGATIVE
Resp Syncytial Virus by PCR: NEGATIVE
SARS Coronavirus 2 by RT PCR: NEGATIVE

## 2022-10-24 LAB — GROUP A STREP BY PCR: Group A Strep by PCR: NOT DETECTED

## 2022-10-24 MED ORDER — IBUPROFEN 400 MG PO TABS
600.0000 mg | ORAL_TABLET | Freq: Once | ORAL | Status: AC
  Administered 2022-10-24: 600 mg via ORAL
  Filled 2022-10-24: qty 1

## 2022-10-24 MED ORDER — ACETAMINOPHEN 325 MG PO TABS: 650.0000 mg | ORAL_TABLET | Freq: Once | ORAL | Status: DC

## 2022-10-24 MED ORDER — AZITHROMYCIN 250 MG PO TABS: 250.0000 mg | ORAL_TABLET | Freq: Every day | ORAL | 0 refills | Status: DC

## 2022-10-24 NOTE — ED Provider Notes (Signed)
[facility]  Provider Note  CSN: [id number] Arrival date & time: 10/23/22 2332  History Chief Complaint  Patient presents with   Sore Throat    [facility] is a 72 y.o. male with history of HTN, DM HLD and cirrhosis reports 3 days of sore throat, hoarse voice and cough, occasional phlegm. No reported fever at home. Hurts when he swallows but not feeling SOB.    Home Medications Prior to Admission medications   Medication Sig Start Date End Date Taking? Authorizing Provider  azithromycin (ZITHROMAX) 250 MG tablet Take 1 tablet (250 mg total) by mouth daily. Take first 2 tablets together, then 1 every day until finished. 10/24/22  Yes Pollyann Savoy, MD  amLODipine (NORVASC) 5 MG tablet Take 1 tablet (5 mg total) by mouth daily. 01/26/21   Sandford Craze, NP  atorvastatin (LIPITOR) 10 MG tablet Take 1 tablet (10 mg total) by mouth at bedtime. 01/26/21   Sandford Craze, NP  carvedilol (COREG) 12.5 MG tablet Take 1 tablet (12.5 mg total) by mouth daily. 05/24/22   Cirigliano, Vito V, DO  doxycycline (VIBRA-TABS) 100 MG tablet Take 1 tablet (100 mg total) by mouth 2 (two) times daily. 03/23/22   Sandford Craze, NP  losartan-hydrochlorothiazide (HYZAAR) 100-25 MG tablet Take 1 tablet by mouth daily. for blood pressure 01/26/21   Sandford Craze, NP  metFORMIN (GLUCOPHAGE) 1000 MG tablet Take 1 tablet (1,000 mg total) by mouth 2 (two) times daily with a meal. 11/01/18   Bradd Canary, MD  mupirocin ointment (BACTROBAN) 2 % Apply 1 Application topically 2 (two) times daily as needed. 03/23/22   Sandford Craze, NP     Allergies    Patient has no known allergies.   Review of Systems   Review of Systems Please see HPI for pertinent positives and negatives  Physical Exam BP (!) 147/79 (BP Location: Right Arm)   Pulse 91   Temp (!) 100.9 F (38.3 C) (Tympanic)   Resp 18   Ht 5\' 7"  (1.702 m)   Wt 108.9 kg   SpO2  96%   BMI 37.59 kg/m   Physical Exam Vitals and nursing note reviewed.  Constitutional:      Appearance: Normal appearance.  HENT:     Head: Normocephalic and atraumatic.     Nose: Nose normal.     Mouth/Throat:     Mouth: Mucous membranes are moist.     Pharynx: Uvula midline. Posterior oropharyngeal erythema present. No oropharyngeal exudate.     Tonsils: No tonsillar exudate or tonsillar abscesses.  Eyes:     Extraocular Movements: Extraocular movements intact.     Conjunctiva/sclera: Conjunctivae normal.  Cardiovascular:     Rate and Rhythm: Normal rate.  Pulmonary:     Effort: Pulmonary effort is normal.     Breath sounds: Rhonchi present. No wheezing or rales.  Abdominal:     General: Abdomen is flat.     Palpations: Abdomen is soft.     Tenderness: There is no abdominal tenderness.  Musculoskeletal:        General: No swelling. Normal range of motion.     Cervical back: Neck supple.  Skin:    General: Skin is warm and dry.  Neurological:     General: No focal deficit present.     Mental Status: He is alert.  Psychiatric:        Mood and Affect: Mood normal.     ED Results /  Procedures / Treatments   EKG None  Procedures Procedures  Medications Ordered in the ED Medications  ibuprofen (ADVIL) tablet 600 mg (600 mg Oral Given 10/24/22 0022)    Initial Impression and Plan  Patient here with sore throat, laryngitis and fever. Some mild cough, but mostly upper respiratory symptoms. I personally viewed the images from radiology studies and agree with radiologist interpretation: CXR is neg for infiltrates. Strep is negative. Covid/Flu/RSV swab is pending. Motrin for fever.   ED Course   Clinical Course as of 10/24/22 0201  Mon Oct 24, 2022  0158 Covid/Flu/RSV swab is negative. Patient feeling better. Symptoms likely viral, but given fever and other co morbidities, will give a course of zithromax as well. PCP follow up, RTED for any other concerns.   [CS]     Clinical Course User Index [CS] Pollyann Savoy, MD     MDM Rules/Calculators/A&P Medical Decision Making Problems Addressed: Laryngitis: acute illness or injury  Amount and/or Complexity of Data Reviewed Labs: ordered. Decision-making details documented in ED Course. Radiology: ordered and independent interpretation performed. Decision-making details documented in ED Course.  Risk Prescription drug management.     Final Clinical Impression(s) / ED Diagnoses Final diagnoses:  Laryngitis    Rx / DC Orders ED Discharge Orders          Ordered    azithromycin (ZITHROMAX) 250 MG tablet  Daily        10/24/22 0200             Pollyann Savoy, MD 10/24/22 0201

## 2022-12-20 ENCOUNTER — Telehealth: Payer: Self-pay | Admitting: Family Medicine

## 2022-12-20 NOTE — Telephone Encounter (Signed)
Patient called to schedule his physical with NP but said he needs a refill on his fluocinolone (VANOS) 0.01 % cream [[id number]]  DISCONTINUED. Please call pt to advise if he needs to be seen beforehand or if he can simply get refill.

## 2022-12-21 ENCOUNTER — Other Ambulatory Visit: Payer: Self-pay | Admitting: Family

## 2022-12-21 MED ORDER — FLUOCINOLONE ACETONIDE 0.01 % EX CREA: TOPICAL_CREAM | Freq: Two times a day (BID) | CUTANEOUS | 0 refills | Status: DC

## 2022-12-21 NOTE — Telephone Encounter (Signed)
Pt has already scheduled his physical for 03/01/2023. The cream has been discontinued. I didn't see it on the medication list

## 2022-12-21 NOTE — Telephone Encounter (Signed)
Called patient and let him know refill has been sent

## 2023-01-16 ENCOUNTER — Telehealth: Payer: Self-pay | Admitting: Family Medicine

## 2023-01-16 ENCOUNTER — Ambulatory Visit: Payer: Self-pay | Admitting: Surgery

## 2023-01-16 ENCOUNTER — Other Ambulatory Visit: Payer: Self-pay | Admitting: Surgery

## 2023-01-16 DIAGNOSIS — K7581 Nonalcoholic steatohepatitis (NASH): Secondary | ICD-10-CM

## 2023-01-16 DIAGNOSIS — K429 Umbilical hernia without obstruction or gangrene: Secondary | ICD-10-CM | POA: Diagnosis not present

## 2023-01-16 NOTE — Telephone Encounter (Signed)
 Copied from CRM [id number]. Topic: General - Other >> Jan 16, 2023  9:37 AM Isabell A wrote: Reason for CRM: Leita from [facility] calling to request progress notes in regard to patients hernia, patient is currently there right now for their appointment.   Callback number: [phone] Fax number: [phone]
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
## 2023-01-18 ENCOUNTER — Ambulatory Visit
Admission: RE | Admit: 2023-01-18 | Discharge: 2023-01-18 | Disposition: A | Discharge status: Home / Self Care | Payer: BC Managed Care – PPO | Source: Ambulatory Visit | Attending: Surgery | Admitting: Surgery

## 2023-01-18 DIAGNOSIS — K7581 Nonalcoholic steatohepatitis (NASH): Secondary | ICD-10-CM | POA: Diagnosis not present

## 2023-01-18 DIAGNOSIS — R161 Splenomegaly, not elsewhere classified: Secondary | ICD-10-CM | POA: Diagnosis not present

## 2023-01-18 DIAGNOSIS — K429 Umbilical hernia without obstruction or gangrene: Secondary | ICD-10-CM | POA: Diagnosis not present

## 2023-01-18 DIAGNOSIS — N281 Cyst of kidney, acquired: Secondary | ICD-10-CM | POA: Diagnosis not present

## 2023-01-18 DIAGNOSIS — K7460 Unspecified cirrhosis of liver: Secondary | ICD-10-CM | POA: Diagnosis not present

## 2023-01-18 NOTE — Telephone Encounter (Signed)
 Notes faxed over

## 2023-01-20 ENCOUNTER — Ambulatory Visit: Payer: Self-pay | Admitting: Surgery

## 2023-01-20 DIAGNOSIS — K429 Umbilical hernia without obstruction or gangrene: Secondary | ICD-10-CM

## 2023-01-20 DIAGNOSIS — K7581 Nonalcoholic steatohepatitis (NASH): Secondary | ICD-10-CM

## 2023-01-26 NOTE — Pre-Procedure Instructions (Signed)
Surgical Instructions   Your procedure is scheduled on Monday, January 20th. Report to [facility] Main Entrance "A" at 05:30 A.M., then check in with the Admitting office. Any questions or running late day of surgery: call [phone]  Questions prior to your surgery date: call [phone], Monday-Friday, 8am-4pm. If you experience any cold or flu symptoms such as cough, fever, chills, shortness of breath, etc. between now and your scheduled surgery, please notify us at the above number.     Remember:  Do not eat after midnight the night before your surgery   You may drink clear liquids until 04:30 AM the morning of your surgery.   Clear liquids allowed are: Water, Non-Citrus Juices (without pulp), Carbonated Beverages, Clear Tea (no milk, honey, etc.), Black Coffee Only (NO MILK, CREAM OR POWDERED CREAMER of any kind), and Gatorade.    Take these medicines the morning of surgery with A SIP OF WATER  amLODipine (NORVASC)  carvedilol (COREG)     One week prior to surgery, STOP taking any Aspirin (unless otherwise instructed by your surgeon) Aleve, Naproxen, Ibuprofen, Motrin, Advil, Goody's, BC's, all herbal medications, fish oil, and non-prescription vitamins.                     Do NOT Smoke (Tobacco/Vaping) for 24 hours prior to your procedure.  If you use a CPAP at night, you may bring your mask/headgear for your overnight stay.   You will be asked to remove any contacts, glasses, piercing's, hearing aid's, dentures/partials prior to surgery. Please bring cases for these items if needed.    Patients discharged the day of surgery will not be allowed to drive home, and someone needs to stay with them for 24 hours.  SURGICAL WAITING ROOM VISITATION Patients may have no more than 2 support people in the waiting area - these visitors may rotate.   Pre-op nurse will coordinate an appropriate time for 1 ADULT support person, who may not rotate, to accompany patient in pre-op.   Children under the age of 17 must have an adult with them who is not the patient and must remain in the main waiting area with an adult.  If the patient needs to stay at the hospital during part of their recovery, the visitor guidelines for inpatient rooms apply.  Please refer to the [facility] website for the visitor guidelines for any additional information.   If you received a COVID test during your pre-op visit  it is requested that you wear a mask when out in public, stay away from anyone that may not be feeling well and notify your surgeon if you develop symptoms. If you have been in contact with anyone that has tested positive in the last 10 days please notify you surgeon.      Pre-operative CHG Bathing Instructions   You can play a key role in reducing the risk of infection after surgery. Your skin needs to be as free of germs as possible. You can reduce the number of germs on your skin by washing with CHG (chlorhexidine gluconate) soap before surgery. CHG is an antiseptic soap that kills germs and continues to kill germs even after washing.   DO NOT use if you have an allergy to chlorhexidine/CHG or antibacterial soaps. If your skin becomes reddened or irritated, stop using the CHG and notify one of our RNs at [phone].              TAKE A SHOWER THE NIGHT  BEFORE SURGERY AND THE DAY OF SURGERY    Please keep in mind the following:  DO NOT shave, including legs and underarms, 48 hours prior to surgery.   You may shave your face before/day of surgery.  Place clean sheets on your bed the night before surgery Use a clean washcloth (not used since being washed) for each shower. DO NOT sleep with pet's night before surgery.  CHG Shower Instructions:  Wash your face and private area with normal soap. If you choose to wash your hair, wash first with your normal shampoo.  After you use shampoo/soap, rinse your hair and body thoroughly to remove shampoo/soap residue.  Turn the  water OFF and apply half the bottle of CHG soap to a CLEAN washcloth.  Apply CHG soap ONLY FROM YOUR NECK DOWN TO YOUR TOES (washing for 3-5 minutes)  DO NOT use CHG soap on face, private areas, open wounds, or sores.  Pay special attention to the area where your surgery is being performed.  If you are having back surgery, having someone wash your back for you may be helpful. Wait 2 minutes after CHG soap is applied, then you may rinse off the CHG soap.  Pat dry with a clean towel  Put on clean pajamas    Additional instructions for the day of surgery: DO NOT APPLY any lotions, deodorants, cologne, or perfumes.   Do not wear jewelry or makeup Do not wear nail polish, gel polish, artificial nails, or any other type of covering on natural nails (fingers and toes) Do not bring valuables to the hospital. [facility] is not responsible for valuables/personal belongings. Put on clean/comfortable clothes.  Please brush your teeth.  Ask your nurse before applying any prescription medications to the skin.

## 2023-01-27 ENCOUNTER — Other Ambulatory Visit: Payer: Self-pay

## 2023-01-27 ENCOUNTER — Other Ambulatory Visit: Payer: Self-pay | Admitting: Surgery

## 2023-01-27 ENCOUNTER — Ambulatory Visit: Payer: Self-pay | Admitting: Surgery

## 2023-01-27 ENCOUNTER — Encounter (HOSPITAL_COMMUNITY)
Admission: RE | Admit: 2023-01-27 | Discharge: 2023-01-27 | Disposition: A | Discharge status: Home / Self Care | Payer: Medicare Other | Source: Ambulatory Visit | Attending: Surgery | Admitting: Surgery

## 2023-01-27 ENCOUNTER — Encounter (HOSPITAL_COMMUNITY): Payer: Self-pay

## 2023-01-27 VITALS — BP 138/84 | HR 62 | Temp 98.0°F | Resp 17 | Ht 67.0 in | Wt 249.0 lb

## 2023-01-27 DIAGNOSIS — E785 Hyperlipidemia, unspecified: Secondary | ICD-10-CM | POA: Insufficient documentation

## 2023-01-27 DIAGNOSIS — E1169 Type 2 diabetes mellitus with other specified complication: Secondary | ICD-10-CM | POA: Insufficient documentation

## 2023-01-27 DIAGNOSIS — I10 Essential (primary) hypertension: Secondary | ICD-10-CM | POA: Insufficient documentation

## 2023-01-27 DIAGNOSIS — Z6839 Body mass index (BMI) 39.0-39.9, adult: Secondary | ICD-10-CM | POA: Diagnosis not present

## 2023-01-27 DIAGNOSIS — Z01818 Encounter for other preprocedural examination: Secondary | ICD-10-CM | POA: Diagnosis not present

## 2023-01-27 DIAGNOSIS — E669 Obesity, unspecified: Secondary | ICD-10-CM | POA: Diagnosis not present

## 2023-01-27 DIAGNOSIS — K429 Umbilical hernia without obstruction or gangrene: Secondary | ICD-10-CM | POA: Insufficient documentation

## 2023-01-27 DIAGNOSIS — G4733 Obstructive sleep apnea (adult) (pediatric): Secondary | ICD-10-CM | POA: Insufficient documentation

## 2023-01-27 HISTORY — DX: Unspecified cirrhosis of liver: K74.60

## 2023-01-27 LAB — HEMOGLOBIN A1C
Hgb A1c MFr Bld: 5.2 % (ref 4.8–5.6)
Mean Plasma Glucose: 102.54 mg/dL

## 2023-01-27 LAB — GLUCOSE, CAPILLARY: Glucose-Capillary: 100 mg/dL — ABNORMAL HIGH (ref 70–99)

## 2023-01-27 NOTE — Progress Notes (Addendum)
Anesthesia Chart Review:  72 year old male with history of NIDDM2 (A1c 5.2 on 01/27/2023), HTN, HLD, gout, obesity (BMI 39), OSA not on CPAP, diverticulosis, Bell's palsy, thrombocytopenia (baseline platelets ~75, splenomegaly, follows in the GI clinic for MASH cirrhosis. EGD 04/29/22: Grade 2 esophageal varices in the middle/lower third of the esophagus, moderate portal hypertensive gastropathy, normal duodenum. He has radiologic evidence of ascites but no previous required paracentesis.  Last seen by Dr. Barron Alvine 08/30/22, stable at that time,  MELD 12, child Pugh score B, continued on Coreg 12.5 mg daily, one year followup recommended.   Dr. Corliss Skains ordered CMP, CBC, PT/INR on 01/18/2023.  CMP showed creatinine 1.15, sodium 140, potassium 4.8, total bilirubin 2.7, alk phos 103, AST 43, ALT 26.  CBC showed WBC 4.8, hemoglobin 15.9, hematocrit 44.9, platelets 60 (comment states "actual platelet count may be somewhat higher than reported due to aggregation of platelets in this sample."),  PT/INR 14/1.3.  Full results available in Care Everywhere.  Dr. Corliss Skains has ordered 2 units of platelets to be given preoperatively.  EKG 01/27/2023: Sinus bradycardia.  Rate 57.  TTE 09/04/2017: - Left ventricle: The cavity size was normal. Wall thickness was    increased in a pattern of mild LVH. There was moderate concentric    hypertrophy. Systolic function was normal. The estimated ejection    fraction was in the range of 55% to 65%. Wall motion was normal;    there were no regional wall motion abnormalities. Left    ventricular diastolic function parameters were normal.  - Aortic valve: Valve area (Vmax): 2.03 cm^2.  - Pulmonary arteries: PA peak pressure: 40 mm Hg (S).    Zannie Cove [facility] Short Stay Center/Anesthesiology Phone [phone] 01/27/2023 10:01 AM

## 2023-01-27 NOTE — Anesthesia Preprocedure Evaluation (Signed)
Anesthesia Evaluation  Patient identified by MRN, date of birth, ID band Patient awake    Reviewed: Allergy & Precautions, NPO status , Patient's Chart, lab work & pertinent test results, reviewed documented beta blocker date and time   History of Anesthesia Complications Negative for: history of anesthetic complications  Airway Mallampati: IV  TM Distance: >3 FB Neck ROM: Full    Dental  (+) Teeth Intact, Dental Advisory Given   Pulmonary neg shortness of breath, sleep apnea , neg COPD, neg recent URI, former smoker   breath sounds clear to auscultation       Cardiovascular hypertension, Pt. on medications and Pt. on home beta blockers (-) angina (-) Past MI and (-) CHF  Rhythm:Regular     Neuro/Psych  PSYCHIATRIC DISORDERS Anxiety     negative neurological ROS     GI/Hepatic ,,,(+) Cirrhosis   Esophageal Varices and ascites    MASH Splenomegaly Portal HTN  Lab Results      Component                Value               Date                      ALT                      29                  05/09/2022                AST                      46 (H)              05/09/2022                ALKPHOS                  116                 05/09/2022                BILITOT                  1.8 (H)             05/09/2022              Endo/Other  diabetes  Lab Results      Component                Value               Date                      HGBA1C                   5.2                 01/27/2023             Renal/GU Lab Results      Component                Value               Date  NA                       138                 05/09/2022                K                        4.1                 05/09/2022                CO2                      25                  05/09/2022                GLUCOSE                  119 (H)             05/09/2022                BUN                      17                   05/09/2022                CREATININE               0.96                05/09/2022                CALCIUM                  8.6 (L)             05/09/2022                GFR                      60.94               02/22/2022                GFRNONAA                 >60                 05/09/2022                Musculoskeletal   Abdominal   Peds  Hematology  (+) Blood dyscrasia Lab Results      Component                Value               Date                      WBC                      5.9                 05/09/2022  HGB                      15.2                05/09/2022                HCT                      42.4                05/09/2022                MCV                      95.3                05/09/2022                PLT                      73 (L)              05/09/2022              Anesthesia Other Findings   Reproductive/Obstetrics                              Anesthesia Physical Anesthesia Plan  ASA: 4  Anesthesia Plan: General   Post-op Pain Management:    Induction: Intravenous  PONV Risk Score and Plan: 3 and Ondansetron and Dexamethasone  Airway Management Planned: Oral ETT  Additional Equipment: None  Intra-op Plan:   Post-operative Plan: Extubation in OR  Informed Consent: I have reviewed the patients History and Physical, chart, labs and discussed the procedure including the risks, benefits and alternatives for the proposed anesthesia with the patient or authorized representative who has indicated his/her understanding and acceptance.     Dental advisory given  Plan Discussed with: CRNA  Anesthesia Plan Comments: (PAT note by Antionette Poles, PA-C: 73 year old male with history of NIDDM2 (A1c 5.2 on 01/27/2023), HTN, HLD, gout, obesity (BMI 39), OSA not on CPAP, diverticulosis, Bell's palsy, thrombocytopenia (baseline platelets ~75, splenomegaly, follows in the GI clinic for MASH cirrhosis. EGD 04/29/22:  Grade 2 esophageal varices in the middle/lower third of the esophagus, moderate portal hypertensive gastropathy, normal duodenum. He has radiologic evidence of ascites but no previous required paracentesis.  Last seen by Dr. Barron Alvine 08/30/22, stable at that time,  MELD 12, child Pugh score B, continued on Coreg 12.5 mg daily, one year followup recommended.   Dr. Corliss Skains ordered CMP, CBC, PT/INR on 01/18/2023.  CMP showed creatinine 1.15, sodium 140, potassium 4.8, total bilirubin 2.7, alk phos 103, AST 43, ALT 26.  CBC showed WBC 4.8, hemoglobin 15.9, hematocrit 44.9, platelets 60 (comment states "actual platelet count may be somewhat higher than reported due to aggregation of platelets in this sample."),  PT/INR 14/1.3.  Full results available in Care Everywhere.  Dr. Corliss Skains has ordered 2 units of platelets to be given preoperatively.  EKG 01/27/2023: Sinus bradycardia.  Rate 57.  TTE 09/04/2017: - Left ventricle: The cavity size was normal. Wall thickness was    increased in a pattern of mild LVH. There was moderate concentric    hypertrophy. Systolic function was normal. The estimated ejection    fraction was in the range of 55% to 65%. Wall motion  was normal;    there were no regional wall motion abnormalities. Left    ventricular diastolic function parameters were normal.  - Aortic valve: Valve area (Vmax): 2.03 cm^2.  - Pulmonary arteries: PA peak pressure: 40 mm Hg (S).   )         Anesthesia Quick Evaluation

## 2023-01-27 NOTE — Progress Notes (Signed)
PCP - Dr. Danise Edge Cardiologist - denies  PPM/ICD - denies   Chest x-ray - 10/23/22 EKG - 01/27/23 Stress Test - 10+ yrs ago- in [place] per pt- normal per pt ECHO - 09/04/17 Cardiac Cath - denies  Sleep Study - OSA+ CPAP - denies, pt states he used to wear a CPAP but has not had any issues in many years and no longer needs it  DM- pt states he was told he no longer needed to check his CBG or take DM medication. He does not know his typical fasting levels  Last dose of GLP1 agonist-  n/a   ASA/Blood Thinner Instructions: n/a   ERAS Protcol - yes, no drink   COVID TEST- n/a   Anesthesia review: yes, pt is seeing GI for cirrhosis. Low plts. 2 units plts ordered pre-op DOS per Dr. Corliss Skains.  Patient denies shortness of breath, fever, cough and chest pain at PAT appointment   All instructions explained to the patient, with a verbal understanding of the material. Patient agrees to go over the instructions while at home for a better understanding.  The opportunity to ask questions was provided.

## 2023-01-30 ENCOUNTER — Encounter (HOSPITAL_COMMUNITY): Payer: Self-pay | Admitting: Surgery

## 2023-01-30 ENCOUNTER — Encounter (HOSPITAL_COMMUNITY)
Admission: RE | Disposition: A | Discharge status: Home / Self Care | Payer: Self-pay | Source: Home / Self Care | Attending: Surgery

## 2023-01-30 ENCOUNTER — Ambulatory Visit (HOSPITAL_COMMUNITY): Payer: Medicare Other | Admitting: Anesthesiology

## 2023-01-30 ENCOUNTER — Ambulatory Visit (HOSPITAL_COMMUNITY): Payer: Medicare Other | Admitting: Physician Assistant

## 2023-01-30 ENCOUNTER — Other Ambulatory Visit: Payer: Self-pay

## 2023-01-30 ENCOUNTER — Observation Stay (HOSPITAL_COMMUNITY)
Admission: RE | Admit: 2023-01-30 | Discharge: 2023-01-31 | Disposition: A | Discharge status: Home / Self Care | Payer: Medicare Other | Attending: Surgery | Admitting: Surgery

## 2023-01-30 DIAGNOSIS — E669 Obesity, unspecified: Secondary | ICD-10-CM

## 2023-01-30 DIAGNOSIS — Z87891 Personal history of nicotine dependence: Secondary | ICD-10-CM | POA: Insufficient documentation

## 2023-01-30 DIAGNOSIS — I10 Essential (primary) hypertension: Secondary | ICD-10-CM | POA: Insufficient documentation

## 2023-01-30 DIAGNOSIS — K429 Umbilical hernia without obstruction or gangrene: Secondary | ICD-10-CM | POA: Diagnosis not present

## 2023-01-30 DIAGNOSIS — E1169 Type 2 diabetes mellitus with other specified complication: Secondary | ICD-10-CM

## 2023-01-30 DIAGNOSIS — G4730 Sleep apnea, unspecified: Secondary | ICD-10-CM | POA: Diagnosis not present

## 2023-01-30 DIAGNOSIS — E119 Type 2 diabetes mellitus without complications: Secondary | ICD-10-CM | POA: Insufficient documentation

## 2023-01-30 DIAGNOSIS — Z79899 Other long term (current) drug therapy: Secondary | ICD-10-CM | POA: Diagnosis not present

## 2023-01-30 DIAGNOSIS — K7469 Other cirrhosis of liver: Secondary | ICD-10-CM | POA: Diagnosis not present

## 2023-01-30 HISTORY — PX: UMBILICAL HERNIA REPAIR: SHX196

## 2023-01-30 LAB — TYPE AND SCREEN
ABO/RH(D): O NEG
Antibody Screen: NEGATIVE

## 2023-01-30 LAB — GLUCOSE, CAPILLARY
Glucose-Capillary: 122 mg/dL — ABNORMAL HIGH (ref 70–99)
Glucose-Capillary: 122 mg/dL — ABNORMAL HIGH (ref 70–99)

## 2023-01-30 LAB — ABO/RH: ABO/RH(D): O NEG

## 2023-01-30 SURGERY — REPAIR, HERNIA, UMBILICAL, ADULT
Anesthesia: General

## 2023-01-30 MED ORDER — EPHEDRINE SULFATE (PRESSORS) 50 MG/ML IJ SOLN
INTRAMUSCULAR | Status: DC | PRN
  Administered 2023-01-30 (×4): 5 mg via INTRAVENOUS

## 2023-01-30 MED ORDER — ALBUMIN HUMAN 5 % IV SOLN: INTRAVENOUS | Status: DC | PRN

## 2023-01-30 MED ORDER — CEFAZOLIN SODIUM-DEXTROSE 2-3 GM-%(50ML) IV SOLR
INTRAVENOUS | Status: DC | PRN
  Administered 2023-01-30: 2 g via INTRAVENOUS

## 2023-01-30 MED ORDER — ROCURONIUM BROMIDE 10 MG/ML (PF) SYRINGE
PREFILLED_SYRINGE | INTRAVENOUS | Status: DC | PRN
  Administered 2023-01-30: 70 mg via INTRAVENOUS

## 2023-01-30 MED ORDER — DEXAMETHASONE SODIUM PHOSPHATE 10 MG/ML IJ SOLN
INTRAMUSCULAR | Status: DC | PRN
  Administered 2023-01-30: 5 mg via INTRAVENOUS

## 2023-01-30 MED ORDER — OXYCODONE HCL 5 MG/5ML PO SOLN
ORAL | Status: AC
  Filled 2023-01-30: qty 5

## 2023-01-30 MED ORDER — ACETAMINOPHEN 500 MG PO TABS
1000.0000 mg | ORAL_TABLET | ORAL | Status: AC
  Administered 2023-01-30: 1000 mg via ORAL
  Filled 2023-01-30: qty 2

## 2023-01-30 MED ORDER — ORAL CARE MOUTH RINSE: 15.0000 mL | Freq: Once | OROMUCOSAL | Status: AC

## 2023-01-30 MED ORDER — BUPIVACAINE-EPINEPHRINE (PF) 0.25% -1:200000 IJ SOLN
INTRAMUSCULAR | Status: AC
  Filled 2023-01-30: qty 30

## 2023-01-30 MED ORDER — DIPHENHYDRAMINE HCL 50 MG/ML IJ SOLN: 12.5000 mg | Freq: Four times a day (QID) | INTRAMUSCULAR | Status: DC | PRN

## 2023-01-30 MED ORDER — ONDANSETRON HCL 4 MG/2ML IJ SOLN
INTRAMUSCULAR | Status: DC | PRN
  Administered 2023-01-30: 4 mg via INTRAVENOUS

## 2023-01-30 MED ORDER — INSULIN ASPART 100 UNIT/ML IJ SOLN
0.0000 [IU] | INTRAMUSCULAR | Status: DC | PRN
Start: 2023-01-30 — End: 2023-01-30

## 2023-01-30 MED ORDER — CHLORHEXIDINE GLUCONATE CLOTH 2 % EX PADS: 6.0000 | MEDICATED_PAD | Freq: Once | CUTANEOUS | Status: DC

## 2023-01-30 MED ORDER — FENTANYL CITRATE (PF) 100 MCG/2ML IJ SOLN
25.0000 ug | INTRAMUSCULAR | Status: DC | PRN
  Administered 2023-01-30: 25 ug via INTRAVENOUS
  Administered 2023-01-30: 50 ug via INTRAVENOUS

## 2023-01-30 MED ORDER — PROPOFOL 10 MG/ML IV BOLUS
INTRAVENOUS | Status: DC | PRN
  Administered 2023-01-30: 140 mg via INTRAVENOUS

## 2023-01-30 MED ORDER — FENTANYL CITRATE (PF) 100 MCG/2ML IJ SOLN
INTRAMUSCULAR | Status: AC
  Filled 2023-01-30: qty 2

## 2023-01-30 MED ORDER — CARVEDILOL 12.5 MG PO TABS
12.5000 mg | ORAL_TABLET | Freq: Every day | ORAL | Status: DC
Start: 2023-01-31 — End: 2023-01-31

## 2023-01-30 MED ORDER — LACTATED RINGERS IV SOLN: INTRAVENOUS | Status: DC

## 2023-01-30 MED ORDER — SODIUM CHLORIDE 0.9% IV SOLUTION: Freq: Once | INTRAVENOUS | Status: DC

## 2023-01-30 MED ORDER — PHENYLEPHRINE HCL (PRESSORS) 10 MG/ML IV SOLN
INTRAVENOUS | Status: DC | PRN
Start: 2023-01-30 — End: 2023-01-30
  Administered 2023-01-30 (×4): 80 ug via INTRAVENOUS

## 2023-01-30 MED ORDER — PROPOFOL 10 MG/ML IV BOLUS
INTRAVENOUS | Status: AC
  Filled 2023-01-30: qty 20

## 2023-01-30 MED ORDER — OXYCODONE HCL 5 MG/5ML PO SOLN
5.0000 mg | Freq: Once | ORAL | Status: AC | PRN
  Administered 2023-01-30: 5 mg via ORAL

## 2023-01-30 MED ORDER — OXYCODONE HCL 5 MG PO TABS: 5.0000 mg | ORAL_TABLET | Freq: Once | ORAL | Status: AC | PRN

## 2023-01-30 MED ORDER — CHLORHEXIDINE GLUCONATE 0.12 % MT SOLN
15.0000 mL | Freq: Once | OROMUCOSAL | Status: AC
  Administered 2023-01-30: 15 mL via OROMUCOSAL
  Filled 2023-01-30: qty 15

## 2023-01-30 MED ORDER — SUGAMMADEX SODIUM 200 MG/2ML IV SOLN
INTRAVENOUS | Status: DC | PRN
  Administered 2023-01-30 (×2): 200 mg via INTRAVENOUS

## 2023-01-30 MED ORDER — MORPHINE SULFATE (PF) 4 MG/ML IV SOLN: 4.0000 mg | INTRAVENOUS | Status: DC | PRN

## 2023-01-30 MED ORDER — 0.9 % SODIUM CHLORIDE (POUR BTL) OPTIME
TOPICAL | Status: DC | PRN
  Administered 2023-01-30: 1000 mL

## 2023-01-30 MED ORDER — PHENYLEPHRINE HCL-NACL 20-0.9 MG/250ML-% IV SOLN
INTRAVENOUS | Status: DC | PRN
  Administered 2023-01-30: 50 ug/min via INTRAVENOUS

## 2023-01-30 MED ORDER — PHENYLEPHRINE 80 MCG/ML (10ML) SYRINGE FOR IV PUSH (FOR BLOOD PRESSURE SUPPORT)
PREFILLED_SYRINGE | INTRAVENOUS | Status: AC
  Filled 2023-01-30: qty 10

## 2023-01-30 MED ORDER — LIDOCAINE 2% (20 MG/ML) 5 ML SYRINGE
INTRAMUSCULAR | Status: DC | PRN
  Administered 2023-01-30: 60 mg via INTRAVENOUS

## 2023-01-30 MED ORDER — FENTANYL CITRATE (PF) 250 MCG/5ML IJ SOLN
INTRAMUSCULAR | Status: AC
  Filled 2023-01-30: qty 5

## 2023-01-30 MED ORDER — FENTANYL CITRATE (PF) 250 MCG/5ML IJ SOLN
INTRAMUSCULAR | Status: DC | PRN
  Administered 2023-01-30: 100 ug via INTRAVENOUS

## 2023-01-30 MED ORDER — BUPIVACAINE-EPINEPHRINE 0.25% -1:200000 IJ SOLN
INTRAMUSCULAR | Status: DC | PRN
  Administered 2023-01-30: 10 mL

## 2023-01-30 MED ORDER — ONDANSETRON HCL 4 MG/2ML IJ SOLN: 4.0000 mg | Freq: Four times a day (QID) | INTRAMUSCULAR | Status: DC | PRN

## 2023-01-30 MED ORDER — DIPHENHYDRAMINE HCL 12.5 MG/5ML PO ELIX: 12.5000 mg | ORAL_SOLUTION | Freq: Four times a day (QID) | ORAL | Status: DC | PRN

## 2023-01-30 MED ORDER — TRAMADOL HCL 50 MG PO TABS: 50.0000 mg | ORAL_TABLET | Freq: Four times a day (QID) | ORAL | Status: DC | PRN

## 2023-01-30 MED ORDER — ONDANSETRON 4 MG PO TBDP: 4.0000 mg | ORAL_TABLET | Freq: Four times a day (QID) | ORAL | Status: DC | PRN

## 2023-01-30 MED ORDER — POLYETHYLENE GLYCOL 3350 17 G PO PACK: 17.0000 g | PACK | Freq: Every day | ORAL | Status: DC

## 2023-01-30 MED ORDER — CEFAZOLIN SODIUM-DEXTROSE 2-4 GM/100ML-% IV SOLN
2.0000 g | INTRAVENOUS | Status: DC
  Filled 2023-01-30: qty 100

## 2023-01-30 MED ORDER — ACETAMINOPHEN 325 MG PO TABS: 650.0000 mg | ORAL_TABLET | Freq: Four times a day (QID) | ORAL | Status: DC | PRN

## 2023-01-30 MED ORDER — ACETAMINOPHEN 650 MG RE SUPP: 650.0000 mg | Freq: Four times a day (QID) | RECTAL | Status: DC | PRN

## 2023-01-30 MED ORDER — OXYCODONE HCL 5 MG PO TABS
5.0000 mg | ORAL_TABLET | ORAL | Status: DC | PRN
  Administered 2023-01-30: 10 mg via ORAL
  Administered 2023-01-31: 5 mg via ORAL
  Filled 2023-01-30: qty 2
  Filled 2023-01-30: qty 1

## 2023-01-30 SURGICAL SUPPLY — 32 items
BAG COUNTER SPONGE SURGICOUNT (BAG) ×1 IMPLANT
BENZOIN TINCTURE PRP APPL 2/3 (GAUZE/BANDAGES/DRESSINGS) ×1 IMPLANT
BLADE CLIPPER SURG (BLADE) IMPLANT
CANISTER SUCT 3000ML PPV (MISCELLANEOUS) IMPLANT
CHLORAPREP W/TINT 26 (MISCELLANEOUS) ×1 IMPLANT
COVER SURGICAL LIGHT HANDLE (MISCELLANEOUS) ×1 IMPLANT
DRAPE LAPAROTOMY 100X72 PEDS (DRAPES) ×1 IMPLANT
DRSG TEGADERM 4X4.75 (GAUZE/BANDAGES/DRESSINGS) ×1 IMPLANT
ELECT CAUTERY BLADE 6.4 (BLADE) ×1 IMPLANT
ELECT REM PT RETURN 9FT ADLT (ELECTROSURGICAL) ×1
ELECTRODE REM PT RTRN 9FT ADLT (ELECTROSURGICAL) ×1 IMPLANT
GAUZE 4X4 16PLY ~~LOC~~+RFID DBL (SPONGE) ×1 IMPLANT
GAUZE SPONGE 2X2 8PLY STRL LF (GAUZE/BANDAGES/DRESSINGS) ×1 IMPLANT
GAUZE SPONGE 4X4 12PLY STRL (GAUZE/BANDAGES/DRESSINGS) IMPLANT
GLOVE BIO SURGEON STRL SZ7 (GLOVE) ×1 IMPLANT
GLOVE BIOGEL PI IND STRL 7.5 (GLOVE) ×1 IMPLANT
GOWN STRL REUS W/ TWL LRG LVL3 (GOWN DISPOSABLE) ×2 IMPLANT
KIT BASIN OR (CUSTOM PROCEDURE TRAY) ×1 IMPLANT
KIT TURNOVER KIT B (KITS) ×1 IMPLANT
NDL HYPO 25GX1X1/2 BEV (NEEDLE) ×1 IMPLANT
NEEDLE HYPO 25GX1X1/2 BEV (NEEDLE) ×1 IMPLANT
NS IRRIG 1000ML POUR BTL (IV SOLUTION) ×1 IMPLANT
PACK GENERAL/GYN (CUSTOM PROCEDURE TRAY) ×1 IMPLANT
PAD ARMBOARD 7.5X6 YLW CONV (MISCELLANEOUS) ×1 IMPLANT
STRIP CLOSURE SKIN 1/2X4 (GAUZE/BANDAGES/DRESSINGS) ×1 IMPLANT
SUT MNCRL AB 4-0 PS2 18 (SUTURE) ×1 IMPLANT
SUT NOVA 1 T20/GS 25DT (SUTURE) IMPLANT
SUT NOVA NAB GS-21 0 18 T12 DT (SUTURE) ×1 IMPLANT
SUT VIC AB 3-0 SH 27X BRD (SUTURE) ×1 IMPLANT
SYR CONTROL 10ML LL (SYRINGE) ×1 IMPLANT
TOWEL GREEN STERILE (TOWEL DISPOSABLE) ×1 IMPLANT
TOWEL GREEN STERILE FF (TOWEL DISPOSABLE) ×1 IMPLANT

## 2023-01-30 NOTE — Progress Notes (Signed)
First unit of platelets started

## 2023-01-30 NOTE — Interval H&P Note (Signed)
History and Physical Interval Note:  01/30/2023 7:16 AM  [name]  has presented today for surgery, with the diagnosis of UMBILICAL HERNIA  CIRRHOSIS.  The various methods of treatment have been discussed with the patient and family. After consideration of risks, benefits and other options for treatment, the patient has consented to  Procedure(s): OPEN UMBILICAL HERNIA REPAIR WITH MESH (N/A) as a surgical intervention.  The patient's history has been reviewed, patient examined, no change in status, stable for surgery.  I have reviewed the patient's chart and labs.  Questions were answered to the patient's satisfaction.     David Esparza

## 2023-01-30 NOTE — Transfer of Care (Signed)
Immediate Anesthesia Transfer of Care Note  Patient: David Esparza  Procedure(s) Performed: OPEN UMBILICAL HERNIA REPAIR WITH MESH  Patient Location: PACU  Anesthesia Type:General  Level of Consciousness: awake, alert , and oriented  Airway & Oxygen Therapy: Patient connected to face mask oxygen  Post-op Assessment: Report given to RN, Post -op Vital signs reviewed and stable, Patient moving all extremities, Patient moving all extremities X 4, and Patient able to stick tongue midline  Post vital signs: Reviewed and stable  Last Vitals:  Vitals Value Taken Time  BP 115/58 01/30/23 0846  Temp    Pulse 67 01/30/23 0851  Resp 17 01/30/23 0851  SpO2 97 % 01/30/23 0851  Vitals shown include unfiled device data.  Last Pain:  Vitals:   01/30/23 0706  TempSrc: Oral  PainSc:          Complications: No notable events documented.

## 2023-01-30 NOTE — Anesthesia Procedure Notes (Signed)
Procedure Name: Intubation Date/Time: 01/30/2023 8:04 AM  Performed by: Margarita Rana, CRNAPre-anesthesia Checklist: Patient identified, Emergency Drugs available, Suction available, Patient being monitored and Timeout performed Patient Re-evaluated:Patient Re-evaluated prior to induction Oxygen Delivery Method: Circle system utilized Preoxygenation: Pre-oxygenation with 100% oxygen Induction Type: IV induction Ventilation: Two handed mask ventilation required Laryngoscope Size: Mac and 3 Grade View: Grade II Tube type: Oral Tube size: 7.0 mm Number of attempts: 1 Airway Equipment and Method: Patient positioned with wedge pillow and Stylet Placement Confirmation: ETT inserted through vocal cords under direct vision, positive ETCO2, breath sounds checked- equal and bilateral and CO2 detector Secured at: 22 cm Tube secured with: Tape

## 2023-01-30 NOTE — Progress Notes (Signed)
2nd unit of platelets started at 0728. 15 minute vitals with be taken in the OR.
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
## 2023-01-30 NOTE — Progress Notes (Signed)
Pt admitted to floor around 1430 after procedure.  VSS on RA Skin check completed Pt voided upon admission to floor.  Ambulated to bathroom with assistance but pt with steady gait.  Upon arrival pt denied any pain but later requested pain meds as pain increased to 6/10. PRN oxy given as ordered. Wife at bedside.

## 2023-01-31 DIAGNOSIS — E119 Type 2 diabetes mellitus without complications: Secondary | ICD-10-CM | POA: Diagnosis not present

## 2023-01-31 DIAGNOSIS — I10 Essential (primary) hypertension: Secondary | ICD-10-CM | POA: Diagnosis not present

## 2023-01-31 DIAGNOSIS — Z79899 Other long term (current) drug therapy: Secondary | ICD-10-CM | POA: Diagnosis not present

## 2023-01-31 DIAGNOSIS — Z87891 Personal history of nicotine dependence: Secondary | ICD-10-CM | POA: Diagnosis not present

## 2023-01-31 DIAGNOSIS — K429 Umbilical hernia without obstruction or gangrene: Secondary | ICD-10-CM | POA: Diagnosis not present

## 2023-01-31 DIAGNOSIS — K7469 Other cirrhosis of liver: Secondary | ICD-10-CM | POA: Diagnosis not present

## 2023-01-31 LAB — PREPARE PLATELET PHERESIS
Unit division: 0
Unit division: 0
Unit division: 0

## 2023-01-31 LAB — COMPREHENSIVE METABOLIC PANEL
ALT: 23 U/L (ref 0–44)
AST: 34 U/L (ref 15–41)
Albumin: 2.9 g/dL — ABNORMAL LOW (ref 3.5–5.0)
Alkaline Phosphatase: 74 U/L (ref 38–126)
Anion gap: 7 (ref 5–15)
BUN: 20 mg/dL (ref 8–23)
CO2: 22 mmol/L (ref 22–32)
Calcium: 8.4 mg/dL — ABNORMAL LOW (ref 8.9–10.3)
Chloride: 105 mmol/L (ref 98–111)
Creatinine, Ser: 1.11 mg/dL (ref 0.61–1.24)
GFR, Estimated: >60 mL/min (ref >60)
Glucose, Bld: 124 mg/dL — ABNORMAL HIGH (ref 70–99)
Potassium: 4.3 mmol/L (ref 3.5–5.1)
Sodium: 134 mmol/L — ABNORMAL LOW (ref 135–145)
Total Bilirubin: 1.7 mg/dL — ABNORMAL HIGH (ref 0.0–1.2)
Total Protein: 6.4 g/dL — ABNORMAL LOW (ref 6.5–8.1)

## 2023-01-31 LAB — BPAM PLATELET PHERESIS
Blood Product Expiration Date: 202501202359
Blood Product Expiration Date: 202501222359
Blood Product Expiration Date: 202501222359
ISSUE DATE / TIME: 202501200642
ISSUE DATE / TIME: 202501200723
Unit Type and Rh: 5100
Unit Type and Rh: 6200
Unit Type and Rh: 6200

## 2023-01-31 LAB — CBC
HCT: 38.8 % — ABNORMAL LOW (ref 39.0–52.0)
Hemoglobin: 13.8 g/dL (ref 13.0–17.0)
MCH: 34.7 pg — ABNORMAL HIGH (ref 26.0–34.0)
MCHC: 35.6 g/dL (ref 30.0–36.0)
MCV: 97.5 fL (ref 80.0–100.0)
Platelets: 72 10*3/uL — ABNORMAL LOW (ref 150–400)
RBC: 3.98 MIL/uL — ABNORMAL LOW (ref 4.22–5.81)
RDW: 13.4 % (ref 11.5–15.5)
WBC: 8.8 10*3/uL (ref 4.0–10.5)
nRBC: 0 % (ref 0.0–0.2)

## 2023-01-31 MED ORDER — OXYCODONE HCL 5 MG PO TABS: 5.0000 mg | ORAL_TABLET | ORAL | 0 refills | Status: DC | PRN

## 2023-01-31 NOTE — Discharge Summary (Signed)
Physician Discharge Summary  Patient ID: AMEL DEBOIS MRN: [id number] DOB/AGE: 1950-10-21 73 y.o.  Admit date: 01/30/2023 Discharge date: 01/31/2023  Admission Diagnoses:  Umbilical hernia     Hepatic cirrhosis    Type 2 diabetes  Discharge Diagnoses: Same Principal Problem:   Umbilical hernia   Discharged Condition: good  Hospital Course: 01/30/23 - He underwent umbilical hernia repair with primary suture repair of a 1.5 cm defect.  He was transfused platelets during the surgery for thrombocytopenia.  He was kept overnight for observation due to his significant medical comorbidities.  He did well overnight with moderate pain and no hemodynamic issues.  Ready for discharge today.  Significant Diagnostic Studies:  Lab Results  Component Value Date   WBC 8.8 01/31/2023   HGB 13.8 01/31/2023   HCT 38.8 (L) 01/31/2023   MCV 97.5 01/31/2023   PLT 72 (L) 01/31/2023   Lab Results  Component Value Date   NA 134 (L) 01/31/2023   CL 105 01/31/2023   K 4.3 01/31/2023   CO2 22 01/31/2023   BUN 20 01/31/2023   CREATININE 1.11 01/31/2023   GFRNONAA >60 01/31/2023   CALCIUM 8.4 (L) 01/31/2023   PHOS 3.5 09/11/2012   ALBUMIN 2.9 (L) 01/31/2023   GLUCOSE 124 (H) 01/31/2023      Latest Ref Rng & Units 01/31/2023    6:22 AM 05/09/2022   12:03 PM 03/29/2022   10:22 AM  Hepatic Function  Total Protein 6.5 - 8.1 g/dL 6.4  6.6  7.2   Albumin 3.5 - 5.0 g/dL 2.9  3.2  3.6   AST 15 - 41 U/L 34  46  50   ALT 0 - 44 U/L 23  29  32   Alk Phosphatase 38 - 126 U/L 74  116  110   Total Bilirubin 0.0 - 1.2 mg/dL 1.7  1.8  2.6     Treatments: Umbilical hernia repair  Discharge Exam: Blood pressure 120/79, pulse 78, temperature 98.3 F (36.8 C), temperature source Oral, resp. rate 16, height 5\' 7"  (1.702 m), weight 112.9 kg, SpO2 93%. WDWN in NAD Abd - soft, incisional tenderness around umbilicus Some slight swelling around incision but no drainage or bleeding through  incision.  Disposition: Discharge disposition: 01-Home or Self Care       Discharge Instructions     Call MD for:  persistant nausea and vomiting   Complete by: As directed    Call MD for:  redness, tenderness, or signs of infection (pain, swelling, redness, odor or green/yellow discharge around incision site)   Complete by: As directed    Call MD for:  severe uncontrolled pain   Complete by: As directed    Call MD for:  temperature >100.4   Complete by: As directed    Diet general   Complete by: As directed    Driving Restrictions   Complete by: As directed    Do not drive while taking pain medications   Increase activity slowly   Complete by: As directed    May shower / Bathe   Complete by: As directed       Allergies as of 01/31/2023   No Known Allergies      Medication List     TAKE these medications    amLODipine 5 MG tablet Commonly known as: NORVASC Take 1 tablet (5 mg total) by mouth daily.   atorvastatin 10 MG tablet Commonly known as: LIPITOR Take 1 tablet (10 mg total) by mouth at  bedtime.   azithromycin 250 MG tablet Commonly known as: ZITHROMAX Take 1 tablet (250 mg total) by mouth daily. Take first 2 tablets together, then 1 every day until finished.   carvedilol 12.5 MG tablet Commonly known as: Coreg Take 1 tablet (12.5 mg total) by mouth daily.   doxycycline 100 MG tablet Commonly known as: VIBRA-TABS Take 1 tablet (100 mg total) by mouth 2 (two) times daily.   fluocinolone 0.01 % cream Apply topically 2 (two) times daily.   losartan-hydrochlorothiazide 100-25 MG tablet Commonly known as: HYZAAR Take 1 tablet by mouth daily. for blood pressure   metFORMIN 1000 MG tablet Commonly known as: GLUCOPHAGE Take 1 tablet (1,000 mg total) by mouth 2 (two) times daily with a meal.   methylcellulose oral powder Take 1 packet by mouth daily.   mupirocin ointment 2 % Commonly known as: BACTROBAN Apply 1 Application topically 2 (two)  times daily as needed.   oxyCODONE 5 MG immediate release tablet Commonly known as: Oxy IR/ROXICODONE Take 1 tablet (5 mg total) by mouth every 4 (four) hours as needed for moderate pain (pain score 4-6).        Follow-up Information     Manus Rudd, MD Follow up in 3 week(s).   Specialty: General Surgery Contact information: [address] [place] [address] [place] [address] [phone]                 Signed: Wynona Luna 01/31/2023, 8:43 AM

## 2023-01-31 NOTE — Plan of Care (Signed)
  Problem: Education: Goal: Knowledge of General Education information will improve Description: Including pain rating scale, medication(s)/side effects and non-pharmacologic comfort measures Outcome: Progressing   Problem: Health Behavior/Discharge Planning: Goal: Ability to manage health-related needs will improve Outcome: Progressing   Problem: Activity: Goal: Risk for activity intolerance will decrease Outcome: Progressing   Problem: Nutrition: Goal: Adequate nutrition will be maintained Outcome: Progressing   Problem: Coping: Goal: Level of anxiety will decrease Outcome: Progressing   Problem: Elimination: Goal: Will not experience complications related to urinary retention Outcome: Progressing   Problem: Pain Managment: Goal: General experience of comfort will improve and/or be controlled Outcome: Progressing

## 2023-01-31 NOTE — Anesthesia Postprocedure Evaluation (Signed)
Anesthesia Post Note  Patient: David Esparza  Procedure(s) Performed: OPEN UMBILICAL HERNIA REPAIR     Patient location during evaluation: PACU Anesthesia Type: General Level of consciousness: awake and alert Pain management: pain level controlled Vital Signs Assessment: post-procedure vital signs reviewed and stable Respiratory status: spontaneous breathing, nonlabored ventilation and respiratory function stable Cardiovascular status: blood pressure returned to baseline and stable Postop Assessment: no apparent nausea or vomiting Anesthetic complications: no   No notable events documented.         Kynadee Dam

## 2023-01-31 NOTE — Discharge Instructions (Signed)
[facility] _______[facility]  UMBILICAL HERNIA REPAIR: POST OP INSTRUCTIONS  Always review your discharge instruction sheet given to you by the facility where your surgery was performed. IF YOU HAVE DISABILITY OR FAMILY LEAVE FORMS, YOU MUST BRING THEM TO THE OFFICE FOR PROCESSING.   DO NOT GIVE THEM TO YOUR DOCTOR.  1. A  prescription for pain medication may be given to you upon discharge.  Take your pain medication as prescribed, if needed.  If narcotic pain medicine is not needed, then you may take ibuprofen (Advil) as needed. 2. Take your usually prescribed medications unless otherwise directed. If you need a refill on your pain medication, please contact your pharmacy.  They will contact our office to request authorization. Prescriptions will not be filled after 5 pm or on week-ends. 3. You should follow a light diet the first 24 hours after arrival home, such as soup and crackers, etc.  Be sure to include lots of fluids daily.  Resume your normal diet the day after surgery. 4.Most patients will experience some swelling and bruising around the umbilicus.  Ice packs and reclining will help.  Swelling and bruising can take several days to resolve.  6. It is common to experience some constipation if taking pain medication after surgery.  Increasing fluid intake and taking a stool softener (such as Colace) will usually help or prevent this problem from occurring.  A mild laxative (Milk of Magnesia or Miralax) should be taken according to package directions if there are no bowel movements after 48 hours. 7. Unless discharge instructions indicate otherwise, you may remove your bandages 48 hours after surgery, and you may shower at that time.  You may have steri-strips (small skin tapes) in place directly over the incision.  These strips should be left on the skin for 7-10 days.   8. ACTIVITIES:  You may resume regular (light) daily activities beginning the next day--such as daily self-care,  walking, climbing stairs--gradually increasing activities as tolerated.  You may have sexual intercourse when it is comfortable.  Refrain from any heavy lifting or straining until approved by your doctor.  a.You may drive when you are no longer taking prescription pain medication, you can comfortably wear a seatbelt, and you can safely maneuver your car and apply brakes. b.RETURN TO WORK:   _____________________________________________  9.You should see your doctor in the office for a follow-up appointment approximately 2-3 weeks after your surgery.  Make sure that you call for this appointment within a day or two after you arrive home to insure a convenient appointment time. 10.OTHER INSTRUCTIONS: _________________________    _____________________________________  WHEN TO CALL YOUR DOCTOR: Fever over 101.0 Inability to urinate Nausea and/or vomiting Extreme swelling or bruising Continued bleeding from incision. Increased pain, redness, or drainage from the incision  The clinic staff is available to answer your questions during regular business hours.  Please don't hesitate to call and ask to speak to one of the nurses for clinical concerns.  If you have a medical emergency, go to the nearest emergency room or call 911.  A surgeon from [facility] is always on call at the hospital   [address], [place], [place]  [address] ?  [address] [address], [place], [place]   [phone] ? [phone] ? FAX [phone] Web site: [email or web address]

## 2023-01-31 NOTE — Progress Notes (Signed)
DISCHARGE NOTE HOME SILIS GAETZ to be discharged Home per MD order. Discussed prescriptions and follow up appointments with the patient. Prescriptions pick up information given to patient; medication list explained in detail. Patient verbalized understanding.  Skin clean, dry and intact without evidence of skin break down, no evidence of skin tears noted. IV catheter discontinued intact. Site without signs and symptoms of complications. Dressing and pressure applied. Pt denies pain at the site currently. No complaints noted.  Patient free of lines, drains, and wounds.   An After Visit Summary (AVS) was printed and given to the patient. Patient escorted via wheelchair, and discharged home via private auto.  Velia Meyer, RN

## 2023-02-01 ENCOUNTER — Encounter (HOSPITAL_COMMUNITY): Payer: Self-pay | Admitting: Surgery

## 2023-02-13 DIAGNOSIS — K429 Umbilical hernia without obstruction or gangrene: Secondary | ICD-10-CM | POA: Diagnosis not present

## 2023-02-13 DIAGNOSIS — K7581 Nonalcoholic steatohepatitis (NASH): Secondary | ICD-10-CM | POA: Diagnosis not present

## 2023-02-28 ENCOUNTER — Ambulatory Visit (INDEPENDENT_AMBULATORY_CARE_PROVIDER_SITE_OTHER): Payer: Medicare Other

## 2023-02-28 VITALS — Ht 67.0 in | Wt 240.0 lb

## 2023-02-28 DIAGNOSIS — Z0000 Encounter for general adult medical examination without abnormal findings: Secondary | ICD-10-CM | POA: Diagnosis not present

## 2023-02-28 DIAGNOSIS — Z1211 Encounter for screening for malignant neoplasm of colon: Secondary | ICD-10-CM | POA: Diagnosis not present

## 2023-02-28 NOTE — Patient Instructions (Addendum)
[name] , Thank you for taking time to come for your Medicare Wellness Visit. I appreciate your ongoing commitment to your health goals. Please review the following plan we discussed and let me know if I can assist you in the future.   Referrals/Orders/Follow-Ups/Clinician Recommendations: Contact office regarding labs.   This is a list of the screening recommended for you and due dates:  Health Maintenance  Topic Date Due   COVID-19 Vaccine (1) Never done   Pneumonia Vaccine (1 of 2 - PCV) Never done   Complete foot exam   Never done   DTaP/Tdap/Td vaccine (1 - Tdap) Never done   Zoster (Shingles) Vaccine (1 of 2) Never done   Flu Shot  Never done   Yearly kidney health urinalysis for diabetes  02/23/2023   Colon Cancer Screening  03/17/2023   Hemoglobin A1C  07/27/2023   Eye exam for diabetics  01/11/2024   Yearly kidney function blood test for diabetes  01/31/2024   Medicare Annual Wellness Visit  02/28/2024   Hepatitis C Screening  Completed   HPV Vaccine  Aged Out   Opioid Pain Medicine Management Opioids are powerful medicines that are used to treat moderate to severe pain. When used for short periods of time, they can help you to: Sleep better. Do better in physical or occupational therapy. Feel better in the first few days after an injury. Recover from surgery. Opioids should be taken with the supervision of a trained health care provider. They should be taken for the shortest period of time possible. This is because opioids can be addictive, and the longer you take opioids, the greater your risk of addiction. This addiction can also be called opioid use disorder. What are the risks? Using opioid pain medicines for longer than 3 days increases your risk of side effects. Side effects include: Constipation. Nausea and vomiting. Breathing difficulties (respiratory depression). Drowsiness. Confusion. Opioid use disorder. Itching. Taking opioid pain medicine for a long  period of time can affect your ability to do daily tasks. It also puts you at risk for: Motor vehicle crashes. Depression. Suicide. Heart attack. Overdose, which can be life-threatening. What is a pain treatment plan? A pain treatment plan is an agreement between you and your health care provider. Pain is unique to each person, and treatments vary depending on your condition. To manage your pain, you and your health care provider need to work together. To help you do this: Discuss the goals of your treatment, including how much pain you might expect to have and how you will manage the pain. Review the risks and benefits of taking opioid medicines. Remember that a good treatment plan uses more than one approach and minimizes the chance of side effects. Be honest about the amount of medicines you take and about any drug or alcohol use. Get pain medicine prescriptions from only one health care provider. Pain can be managed with many types of alternative treatments. Ask your health care provider to refer you to one or more specialists who can help you manage pain through: Physical or occupational therapy. Counseling (cognitive behavioral therapy). Good nutrition. Biofeedback. Massage. Meditation. Non-opioid medicine. Following a gentle exercise program. How to use opioid pain medicine Taking medicine Take your pain medicine exactly as told by your health care provider. Take it only when you need it. If your pain gets less severe, you may take less than your prescribed dose if your health care provider approves. If you are not having pain, do  nottake pain medicine unless your health care provider tells you to take it. If your pain is severe, do nottry to treat it yourself by taking more pills than instructed on your prescription. Contact your health care provider for help. Write down the times when you take your pain medicine. It is easy to become confused while on pain medicine. Writing the  time can help you avoid overdose. Take other over-the-counter or prescription medicines only as told by your health care provider. Keeping yourself and others safe  While you are taking opioid pain medicine: Do not drive, use machinery, or power tools. Do not sign legal documents. Do not drink alcohol. Do not take sleeping pills. Do not supervise children by yourself. Do not do activities that require climbing or being in high places. Do not go to a lake, river, ocean, spa, or swimming pool. Do not share your pain medicine with anyone. Keep pain medicine in a locked cabinet or in a secure area where pets and children cannot reach it. Stopping your use of opioids If you have been taking opioid medicine for more than a few weeks, you may need to slowly decrease (taper) how much you take until you stop completely. Tapering your use of opioids can decrease your risk of symptoms of withdrawal, such as: Pain and cramping in the abdomen. Nausea. Sweating. Sleepiness. Restlessness. Uncontrollable shaking (tremors). Cravings for the medicine. Do not attempt to taper your use of opioids on your own. Talk with your health care provider about how to do this. Your health care provider may prescribe a step-down schedule based on how much medicine you are taking and how long you have been taking it. Getting rid of leftover pills Do not save any leftover pills. Get rid of leftover pills safely by: Taking the medicine to a prescription take-back program. This is usually offered by the county or law enforcement. Bringing them to a pharmacy that has a drug disposal container. Flushing them down the toilet. Check the label or package insert of your medicine to see whether this is safe to do. Throwing them out in the trash. Check the label or package insert of your medicine to see whether this is safe to do. If it is safe to throw it out, remove the medicine from the original container, put it into a  sealable bag or container, and mix it with used coffee grounds, food scraps, dirt, or cat litter before putting it in the trash. Follow these instructions at home: Activity Do exercises as told by your health care provider. Avoid activities that make your pain worse. Return to your normal activities as told by your health care provider. Ask your health care provider what activities are safe for you. General instructions You may need to take these actions to prevent or treat constipation: Drink enough fluid to keep your urine pale yellow. Take over-the-counter or prescription medicines. Eat foods that are high in fiber, such as beans, whole grains, and fresh fruits and vegetables. Limit foods that are high in fat and processed sugars, such as fried or sweet foods. Keep all follow-up visits. This is important. Where to find support If you have been taking opioids for a long time, you may benefit from receiving support for quitting from a local support group or counselor. Ask your health care provider for a referral to these resources in your area. Where to find more information Centers for Disease Control and Prevention (CDC): FootballExhibition.com.br [place] Food and Drug Administration (FDA): PumpkinSearch.com.ee Get  help right away if: You may have taken too much of an opioid (overdosed). Common symptoms of an overdose: Your breathing is slower or more shallow than normal. You have a very slow heartbeat (pulse). You have slurred speech. You have nausea and vomiting. Your pupils become very small. You have other potential symptoms: You are very confused. You faint or feel like you will faint. You have cold, clammy skin. You have blue lips or fingernails. You have thoughts of harming yourself or harming others. These symptoms may represent a serious problem that is an emergency. Do not wait to see if the symptoms will go away. Get medical help right away. Call your local emergency services (911 in the [place] Do  not drive yourself to the hospital.  If you ever feel like you may hurt yourself or others, or have thoughts about taking your own life, get help right away. Go to your nearest emergency department or: Call your local emergency services (911 in the [place] Call the [facility] (539-418-8088 in the [place] Call a suicide crisis helpline, such as the National Suicide Prevention Lifeline at (580)111-1208 or 988 in the [place] This is open 24 hours a day in the [place] If you're a Veteran: Call 988 and press 1. This is open 24 hours a day. Text the [employer] at (909) 749-6059. Summary Opioid medicines can help you manage moderate to severe pain for a short period of time. A pain treatment plan is an agreement between you and your health care provider. Discuss the goals of your treatment, including how much pain you might expect to have and how you will manage the pain. If you think that you or someone else may have taken too much of an opioid, get medical help right away. This information is not intended to replace advice given to you by your health care provider. Make sure you discuss any questions you have with your health care provider. Document Revised: 10/03/2022 Document Reviewed: 04/08/2020 [employer] Patient Education  2024 [employer] Advanced directives: (Declined) Advance directive discussed with you today. Even though you declined this today, please call our office should you change your mind, and we can give you the proper paperwork for you to fill out.  Next Medicare Annual Wellness Visit scheduled for next year: Yes

## 2023-02-28 NOTE — Progress Notes (Signed)
Subjective:   David Esparza is a 73 y.o. male who presents for Medicare Annual/Subsequent preventive examination.  Visit Complete: Virtual I connected with  [name] on 02/28/23 by a audio enabled telemedicine application and verified that I am speaking with the correct person using two identifiers.  Patient Location: Home  Provider Location: Home Office  I discussed the limitations of evaluation and management by telemedicine. The patient expressed understanding and agreed to proceed.  Vital Signs: Because this visit was a virtual/telehealth visit, some criteria may be missing or patient reported. Any vitals not documented were not able to be obtained and vitals that have been documented are patient reported.  Patient Medicare AWV questionnaire was completed by the patient on 02/27/23; I have confirmed that all information answered by patient is correct and no changes since this date.  Cardiac Risk Factors include: advanced age (>33men, >15 women);hypertension;diabetes mellitus;male gender     Objective:    Today's Vitals   02/28/23 1134  Weight: 240 lb (108.9 kg)  Height: 5\' 7"  (1.702 m)   Body mass index is 37.59 kg/m.     02/28/2023   11:40 AM 01/30/2023    6:17 AM 01/27/2023    8:21 AM 10/23/2022   11:41 PM 03/29/2022   11:00 AM 07/20/2018    9:11 AM 07/18/2017    2:15 PM  Advanced Directives  Does Patient Have a Medical Advance Directive? No No No No No No No  Does patient want to make changes to medical advance directive?      Yes (MAU/Ambulatory/Procedural Areas - Information given)   Would patient like information on creating a medical advance directive? No - Patient declined No - Patient declined Yes (MAU/Ambulatory/Procedural Areas - Information given)  No - Patient declined  Yes (MAU/Ambulatory/Procedural Areas - Information given)    Current Medications (verified) Outpatient Encounter Medications as of 02/28/2023  Medication Sig   fluocinolone 0.01 % cream  Apply topically 2 (two) times daily.   amLODipine (NORVASC) 5 MG tablet Take 1 tablet (5 mg total) by mouth daily. (Patient not taking: Reported on 01/25/2023)   atorvastatin (LIPITOR) 10 MG tablet Take 1 tablet (10 mg total) by mouth at bedtime. (Patient not taking: Reported on 01/25/2023)   azithromycin (ZITHROMAX) 250 MG tablet Take 1 tablet (250 mg total) by mouth daily. Take first 2 tablets together, then 1 every day until finished. (Patient not taking: Reported on 01/25/2023)   carvedilol (COREG) 12.5 MG tablet Take 1 tablet (12.5 mg total) by mouth daily.   doxycycline (VIBRA-TABS) 100 MG tablet Take 1 tablet (100 mg total) by mouth 2 (two) times daily. (Patient not taking: Reported on 01/25/2023)   losartan-hydrochlorothiazide (HYZAAR) 100-25 MG tablet Take 1 tablet by mouth daily. for blood pressure (Patient not taking: Reported on 01/25/2023)   metFORMIN (GLUCOPHAGE) 1000 MG tablet Take 1 tablet (1,000 mg total) by mouth 2 (two) times daily with a meal. (Patient not taking: Reported on 01/25/2023)   methylcellulose oral powder Take 1 packet by mouth daily.   mupirocin ointment (BACTROBAN) 2 % Apply 1 Application topically 2 (two) times daily as needed. (Patient not taking: Reported on 01/25/2023)   oxyCODONE (OXY IR/ROXICODONE) 5 MG immediate release tablet Take 1 tablet (5 mg total) by mouth every 4 (four) hours as needed for moderate pain (pain score 4-6).   No facility-administered encounter medications on file as of 02/28/2023.    Allergies (verified) Patient has no known allergies.   History: Past Medical History:  Diagnosis Date   Abnormal LFTs 09/06/2011   Acute bronchitis 08/16/2010   Anxiety 09/06/2011   Chicken pox as a child   Cirrhosis (HCC)    Cutaneous skin tags 07/27/2016   Diabetes mellitus without complication (HCC)    pt denies   Fatigue    Gout    Gout    Grief reaction 09/09/2012   Hyperglycemia 09/06/2011   Hyperlipidemia 09/06/2011   Hypertension     Insomnia 08/16/2010   Knee pain, acute 02/28/2012   Low testosterone 02/28/2012   Measles as a child   Mumps as a child   Nocturia 08/16/2010   Obese    Preventative health care 09/06/2011   Sleep apnea 09/06/2011   Unspecified vitamin D deficiency 02/28/2012   Past Surgical History:  Procedure Laterality Date   BACK SURGERY     COLONOSCOPY  09/16/10   Dr. Arlyce Dice: moderate diverticulosis sigmoid and descending colon, o/w normal: repeat 10 yrs.   intestines sewn  73 yrs old   shot once made 6 holes   TONSILLECTOMY  as a child   UMBILICAL HERNIA REPAIR N/A 01/30/2023   Procedure: OPEN UMBILICAL HERNIA REPAIR;  Surgeon: Manus Rudd, MD;  Location: [facility] OR;  Service: General;  Laterality: N/A;   VASECTOMY  73 yrs old   Family History  Problem Relation Age of Onset   Diabetes Mother        type 2   Esophageal cancer Maternal Grandmother    Cancer Maternal Grandmother        throat   Heart disease Maternal Grandfather    Diabetes Maternal Grandfather    Depression Daughter        anxiety   Graves' disease Daughter    Colon polyps Neg Hx    Colon cancer Neg Hx    Rectal cancer Neg Hx    Stomach cancer Neg Hx    Social History   Socioeconomic History   Marital status: Married    Spouse name: Not on file   Number of children: 3   Years of education: Not on file   Highest education level: Bachelor's degree (e.g., BA, AB, BS)  Occupational History   Occupation: driver/partime  Tobacco Use   Smoking status: Former    Current packs/day: 0.00    Types: Cigarettes    Quit date: 08/30/1968    Years since quitting: 54.5   Smokeless tobacco: Never   Tobacco comments:    smoked in high school  Vaping Use   Vaping status: Never Used  Substance and Sexual Activity   Alcohol use: Yes    Comment: maybe 1 beer once a month   Drug use: No   Sexual activity: Yes    Partners: Female  Other Topics Concern   Not on file  Social History Narrative   Not on file   Social Drivers  of Health   Financial Resource Strain: Low Risk  (02/28/2023)   Overall Financial Resource Strain (CARDIA)    Difficulty of Paying Living Expenses: Not hard at all  Food Insecurity: No Food Insecurity (02/28/2023)   Hunger Vital Sign    Worried About Running Out of Food in the Last Year: Never true    Ran Out of Food in the Last Year: Never true  Transportation Needs: No Transportation Needs (02/28/2023)   PRAPARE - Administrator, Civil Service (Medical): No    Lack of Transportation (Non-Medical): No  Physical Activity: Insufficiently Active (02/28/2023)   Exercise Vital  Sign    Days of Exercise per Week: 2 days    Minutes of Exercise per Session: 10 min  Stress: No Stress Concern Present (02/28/2023)   [employer] of Occupational Health - Occupational Stress Questionnaire    Feeling of Stress : Not at all  Social Connections: Socially Integrated (02/28/2023)   Social Connection and Isolation Panel [NHANES]    Frequency of Communication with Friends and Family: More than three times a week    Frequency of Social Gatherings with Friends and Family: More than three times a week    Attends Religious Services: More than 4 times per year    Active Member of [employer] or Organizations: Yes    Attends Engineer, structural: More than 4 times per year    Marital Status: Married    Tobacco Counseling Counseling given: Not Answered Tobacco comments: smoked in high school   Clinical Intake:  Pre-visit preparation completed: Yes  Pain : No/denies pain     BMI - recorded: 37.59 Nutritional Status: BMI > 30  Obese Nutritional Risks: None Diabetes: No  How often do you need to have someone help you when you read instructions, pamphlets, or other written materials from your doctor or pharmacy?: 1 - Never  Interpreter Needed?: No  Information entered by :: Theresa Mulligan LPN   Activities of Daily Living    02/27/2023   12:45 PM 01/30/2023   10:38 PM  In your  present state of health, do you have any difficulty performing the following activities:  Hearing? 1 0  Comment Wears Hearing Aids   Vision? 0 0  Difficulty concentrating or making decisions? 0 0  Walking or climbing stairs? 0   Dressing or bathing? 0   Doing errands, shopping? 0 0  Preparing Food and eating ? N   Using the Toilet? N   In the past six months, have you accidently leaked urine? N   Do you have problems with loss of bowel control? N   Managing your Medications? N   Managing your Finances? N   Housekeeping or managing your Housekeeping? N     Patient Care Team: Bradd Canary, MD as PCP - General (Family Medicine)  Indicate any recent Medical Services you may have received from other than [facility] providers in the past year (date may be approximate).     Assessment:   This is a routine wellness examination for David Esparza.  Hearing/Vision screen Hearing Screening - Comments:: Wears Hearing Aids Vision Screening - Comments:: Wears rx glasses - up to date with routine eye exams with  [facility]   Goals Addressed               This Visit's Progress     Stay Active (pt-stated)         Depression Screen    02/28/2023   11:50 AM 03/29/2022   11:12 AM 02/22/2022    8:44 AM 03/26/2021    1:45 PM 01/26/2021    1:13 PM 07/20/2018    9:17 AM 07/18/2017    2:18 PM  PHQ 2/9 Scores  PHQ - 2 Score 0 0 2 0 2 0 0  PHQ- 9 Score   5  3      Fall Risk    02/28/2023   11:39 AM 02/27/2023   12:45 PM 02/22/2022    8:43 AM 03/26/2021    1:45 PM 01/26/2021    1:15 PM  Fall Risk   Falls in the past  year? 0 0 0 0 0  Number falls in past yr: 0 0  0 0  Injury with Fall? 0 0  0 0  Risk for fall due to : No Fall Risks   No Fall Risks   Follow up Falls prevention discussed;Falls evaluation completed   Falls evaluation completed     MEDICARE RISK AT HOME: Medicare Risk at Home Any stairs in or around the home?: No If so, are there any without handrails?: No Home free of loose  throw rugs in walkways, pet beds, electrical cords, etc?: Yes Adequate lighting in your home to reduce risk of falls?: Yes Life alert?: No Use of a cane, walker or w/c?: No Grab bars in the bathroom?: No Shower chair or bench in shower?: No Elevated toilet seat or a handicapped toilet?: No  TIMED UP AND GO:  Was the test performed?  No    Cognitive Function:        02/28/2023   11:40 AM  6CIT Screen  What Year? 0 points  What month? 0 points  What time? 0 points  Count back from 20 0 points  Months in reverse 0 points  Repeat phrase 0 points  Total Score 0 points    Immunizations Immunization History  Administered Date(s) Administered   Hepb-cpg 05/03/2022, 06/20/2022    TDAP status: Due, Education has been provided regarding the importance of this vaccine. Advised may receive this vaccine at local pharmacy or Health Dept. Aware to provide a copy of the vaccination record if obtained from local pharmacy or Health Dept. Verbalized acceptance and understanding.  Flu Vaccine status: Declined, Education has been provided regarding the importance of this vaccine but patient still declined. Advised may receive this vaccine at local pharmacy or Health Dept. Aware to provide a copy of the vaccination record if obtained from local pharmacy or Health Dept. Verbalized acceptance and understanding.  Pneumococcal vaccine status: Declined,  Education has been provided regarding the importance of this vaccine but patient still declined. Advised may receive this vaccine at local pharmacy or Health Dept. Aware to provide a copy of the vaccination record if obtained from local pharmacy or Health Dept. Verbalized acceptance and understanding.   Covid-19 vaccine status: Declined, Education has been provided regarding the importance of this vaccine but patient still declined. Advised may receive this vaccine at local pharmacy or Health Dept.or vaccine clinic. Aware to provide a copy of the  vaccination record if obtained from local pharmacy or Health Dept. Verbalized acceptance and understanding.  Qualifies for Shingles Vaccine? Yes   Zostavax completed No   Shingrix Completed?: No.    Education has been provided regarding the importance of this vaccine. Patient has been advised to call insurance company to determine out of pocket expense if they have not yet received this vaccine. Advised may also receive vaccine at local pharmacy or Health Dept. Verbalized acceptance and understanding.  Screening Tests Health Maintenance  Topic Date Due   COVID-19 Vaccine (1) Never done   Pneumonia Vaccine 32+ Years old (1 of 2 - PCV) Never done   FOOT EXAM  Never done   DTaP/Tdap/Td (1 - Tdap) Never done   Zoster Vaccines- Shingrix (1 of 2) Never done   INFLUENZA VACCINE  Never done   Diabetic kidney evaluation - Urine ACR  02/23/2023   Colonoscopy  03/17/2023   HEMOGLOBIN A1C  07/27/2023   OPHTHALMOLOGY EXAM  01/11/2024   Diabetic kidney evaluation - eGFR measurement  01/31/2024   Medicare  Annual Wellness (AWV)  02/28/2024   Hepatitis C Screening  Completed   HPV VACCINES  Aged Out    Health Maintenance  Health Maintenance Due  Topic Date Due   COVID-19 Vaccine (1) Never done   Pneumonia Vaccine 38+ Years old (1 of 2 - PCV) Never done   FOOT EXAM  Never done   DTaP/Tdap/Td (1 - Tdap) Never done   Zoster Vaccines- Shingrix (1 of 2) Never done   INFLUENZA VACCINE  Never done   Diabetic kidney evaluation - Urine ACR  02/23/2023   Colonoscopy  03/17/2023    Colorectal cancer screening: Referral to GI placed 02/28/23. Pt aware the office will call re: appt.    Additional Screening:  Hepatitis C Screening: does qualify; Completed 03/17/22  Vision Screening: Recommended annual ophthalmology exams for early detection of glaucoma and other disorders of the eye. Is the patient up to date with their annual eye exam?  Yes  Who is the provider or what is the name of the office in  which the patient attends annual eye exams? [facility] If pt is not established with a provider, would they like to be referred to a provider to establish care? No .   Dental Screening: Recommended annual dental exams for proper oral hygiene  Diabetic Foot Exam: Diabetic Foot Exam: Overdue, Pt has been advised about the importance in completing this exam. Pt is scheduled for diabetic foot exam on Deferred.  Community Resource Referral / Chronic Care Management:  CRR required this visit?  No   CCM required this visit?  No     Plan:     I have personally reviewed and noted the following in the patient's chart:   Medical and social history Use of alcohol, tobacco or illicit drugs  Current medications and supplements including opioid prescriptions. Patient is currently taking opioid prescriptions. Information provided to patient regarding non-opioid alternatives. Patient advised to discuss non-opioid treatment plan with their provider. Functional ability and status Nutritional status Physical activity Advanced directives List of other physicians Hospitalizations, surgeries, and ER visits in previous 12 months Vitals Screenings to include cognitive, depression, and falls Referrals and appointments  In addition, I have reviewed and discussed with patient certain preventive protocols, quality metrics, and best practice recommendations. A written personalized care plan for preventive services as well as general preventive health recommendations were provided to patient.     Tillie Rung, LPN   09/08/5619   After Visit Summary: (MyChart) Due to this being a telephonic visit, the after visit summary with patients personalized plan was offered to patient via MyChart   Nurse Notes: None

## 2023-03-01 ENCOUNTER — Encounter: Payer: Medicare Other | Admitting: Family

## 2023-03-07 ENCOUNTER — Encounter: Payer: Medicare Other | Admitting: Family

## 2023-03-07 ENCOUNTER — Telehealth: Payer: Self-pay | Admitting: Family Medicine

## 2023-03-07 NOTE — Telephone Encounter (Signed)
 Copied from CRM [id number]. Topic: Appointments - Appointment Scheduling >> Mar 07, 2023  3:50 PM Sim Boast F wrote: Patient no showed 2/25 appointment today, said he thought it was for 2/26 - rescheduled patient to 2/26

## 2023-03-08 ENCOUNTER — Telehealth: Payer: Self-pay | Admitting: Family

## 2023-03-08 ENCOUNTER — Ambulatory Visit (INDEPENDENT_AMBULATORY_CARE_PROVIDER_SITE_OTHER): Payer: Medicare Other | Admitting: Family

## 2023-03-08 ENCOUNTER — Encounter: Payer: Self-pay | Admitting: Family

## 2023-03-08 DIAGNOSIS — R809 Proteinuria, unspecified: Secondary | ICD-10-CM

## 2023-03-08 DIAGNOSIS — E1169 Type 2 diabetes mellitus with other specified complication: Secondary | ICD-10-CM | POA: Diagnosis not present

## 2023-03-08 DIAGNOSIS — E669 Obesity, unspecified: Secondary | ICD-10-CM | POA: Diagnosis not present

## 2023-03-08 DIAGNOSIS — Z125 Encounter for screening for malignant neoplasm of prostate: Secondary | ICD-10-CM | POA: Diagnosis not present

## 2023-03-08 DIAGNOSIS — G4700 Insomnia, unspecified: Secondary | ICD-10-CM

## 2023-03-08 DIAGNOSIS — K7581 Nonalcoholic steatohepatitis (NASH): Secondary | ICD-10-CM | POA: Insufficient documentation

## 2023-03-08 DIAGNOSIS — J069 Acute upper respiratory infection, unspecified: Secondary | ICD-10-CM | POA: Insufficient documentation

## 2023-03-08 DIAGNOSIS — H269 Unspecified cataract: Secondary | ICD-10-CM

## 2023-03-08 DIAGNOSIS — I10 Essential (primary) hypertension: Secondary | ICD-10-CM

## 2023-03-08 DIAGNOSIS — K7460 Unspecified cirrhosis of liver: Secondary | ICD-10-CM | POA: Insufficient documentation

## 2023-03-08 DIAGNOSIS — Z1211 Encounter for screening for malignant neoplasm of colon: Secondary | ICD-10-CM

## 2023-03-08 DIAGNOSIS — L918 Other hypertrophic disorders of the skin: Secondary | ICD-10-CM | POA: Diagnosis not present

## 2023-03-08 DIAGNOSIS — K7469 Other cirrhosis of liver: Secondary | ICD-10-CM | POA: Insufficient documentation

## 2023-03-08 LAB — COMPREHENSIVE METABOLIC PANEL
ALT: 38 U/L (ref 0–53)
AST: 71 U/L — ABNORMAL HIGH (ref 0–37)
Albumin: 3.2 g/dL — ABNORMAL LOW (ref 3.5–5.2)
Alkaline Phosphatase: 68 U/L (ref 39–117)
BUN: 21 mg/dL (ref 6–23)
CO2: 24 meq/L (ref 19–32)
Calcium: 7.7 mg/dL — ABNORMAL LOW (ref 8.4–10.5)
Chloride: 104 meq/L (ref 96–112)
Creatinine, Ser: 0.99 mg/dL (ref 0.40–1.50)
GFR: 75.44 mL/min (ref >60.00)
Glucose, Bld: 98 mg/dL (ref 70–99)
Potassium: 3.9 meq/L (ref 3.5–5.1)
Sodium: 136 meq/L (ref 135–145)
Total Bilirubin: 2.4 mg/dL — ABNORMAL HIGH (ref 0.2–1.2)
Total Protein: 6.6 g/dL (ref 6.0–8.3)

## 2023-03-08 LAB — URINALYSIS, ROUTINE W REFLEX MICROSCOPIC
Bilirubin Urine: NEGATIVE
Ketones, ur: NEGATIVE
Leukocytes,Ua: NEGATIVE
Nitrite: NEGATIVE
Specific Gravity, Urine: 1.025 (ref 1.000–1.030)
Urine Glucose: NEGATIVE
Urobilinogen, UA: 2 — AB (ref 0.0–1.0)
pH: 6 (ref 5.0–8.0)

## 2023-03-08 LAB — MICROALBUMIN / CREATININE URINE RATIO
Creatinine,U: 230.5 mg/dL
Microalb Creat Ratio: 8.9 mg/g (ref 0.0–30.0)
Microalb, Ur: 2.1 mg/dL — ABNORMAL HIGH (ref 0.0–1.9)

## 2023-03-08 LAB — PSA, MEDICARE: PSA: 0.38 ng/mL (ref 0.10–4.00)

## 2023-03-08 MED ORDER — BENZONATATE 100 MG PO CAPS
100.0000 mg | ORAL_CAPSULE | Freq: Three times a day (TID) | ORAL | 0 refills | Status: DC | PRN
Start: 2023-03-08 — End: 2023-03-28

## 2023-03-08 MED ORDER — LISINOPRIL 2.5 MG PO TABS: 2.5000 mg | ORAL_TABLET | Freq: Every day | ORAL | 1 refills | Status: DC

## 2023-03-08 MED ORDER — CALTRATE 600+D PLUS MINERALS 600-800 MG-UNIT PO CHEW: 1.0000 | CHEWABLE_TABLET | Freq: Two times a day (BID) | ORAL | Status: DC

## 2023-03-08 MED ORDER — ALBUTEROL SULFATE HFA 108 (90 BASE) MCG/ACT IN AERS: 2.0000 | INHALATION_SPRAY | Freq: Four times a day (QID) | RESPIRATORY_TRACT | 0 refills | Status: DC | PRN

## 2023-03-08 MED ORDER — ALPRAZOLAM 0.5 MG PO TABS: 0.5000 mg | ORAL_TABLET | Freq: Every evening | ORAL | 0 refills | Status: DC | PRN

## 2023-03-08 NOTE — Patient Instructions (Signed)
 VISIT SUMMARY:  During your visit, we discussed your recent nasal congestion and cough, difficulty sleeping, skin tags, and general health maintenance. We suspect your cough and congestion may be due to a viral infection, and we will perform a COVID test today. We also discussed your difficulty sleeping during periods of stress and decided to provide a one-time prescription for Xanax. We will schedule an appointment for the removal of your skin tags. We also discussed your general health and planned some routine tests.  YOUR PLAN:  -UPPER RESPIRATORY INFECTION: This is a common illness that affects your nose, throat, and airways. It's usually caused by a virus. We will perform a COVID test today and prescribe an inhaler, Mucinex for secretions, and a cough suppressant.  -INSOMNIA: This is a sleep disorder that can make it hard to fall asleep, hard to stay asleep, or cause you to wake up too early and not be able to get back to sleep. We will provide a one-time prescription for Xanax to help manage your current stress and improve your sleep.  -SKIN TAGS: These are small, soft skin growths. They are harmless, but you may want them removed for cosmetic reasons. We will schedule an appointment for their removal.  -GENERAL HEALTH MAINTENANCE: This involves routine tests and vaccinations to keep you healthy. We will check your PSA levels on today's lab work and schedule a colonoscopy due in a month. You declined the pneumonia vaccine and flu shot.  INSTRUCTIONS:  Please take your prescribed medications as directed. Use the inhaler, two puffs every six hours, and take Mucinex for secretions as needed. Use the cough suppressant as needed. Take half a tablet of Xanax as needed for sleep. We will schedule an appointment for your skin tag removal and a colonoscopy. Please return in four months for routine labs.

## 2023-03-08 NOTE — Progress Notes (Signed)
 Subjective:     Patient ID: David Esparza, male    DOB: December 20, 1950, 73 y.o.   MRN: [id number]  Chief Complaint  Patient presents with   Annual Exam   Nasal Congestion    Complains of nasal congestion since Sunday    Cough    Complains of cough since sunday    HPI  Discussed the use of AI scribe software for clinical note transcription with the patient, who gave verbal consent to proceed.  History of Present Illness  The patient presents with nasal congestion and cough.  Nasal congestion and cough began on Sunday, with the cough producing light-colored mucus. No fever or body aches, but there is fatigue. Wheezing has been present for the last three days, particularly when lying down, and is accompanied by clear phlegm production. No shortness of breath or tightness in breathing.  They have not been tested for COVID recently and are open to being tested today. They have not received a pneumonia vaccination, which is recommended for individuals over the age of 27.  They have difficulty sleeping, which they attribute to stress from a current project. They request a prescription for Xanax to help with sleep, noting that they typically only take half a tablet and have not used it in a long time.  They have skin tags on their neck and another area and requests they be removed.  They have cataracts and are seeking a recommendation for an eye doctor.  They have a history of a hernia operation performed a month ago near the umbilicus, with no complications reported.    Lab Results  Component Value Date   HGBA1C 5.2 01/27/2023    Lab Results  Component Value Date   PSA 0.48 02/22/2022   PSA 1.72 01/26/2021   PSA 0.60 01/08/2018     Health Maintenance Due  Topic Date Due   COVID-19 Vaccine (1) Never done   FOOT EXAM  Never done   DTaP/Tdap/Td (1 - Tdap) Never done   Zoster Vaccines- Shingrix (1 of 2) Never done   Diabetic kidney evaluation - Urine ACR  02/23/2023    Colonoscopy  03/17/2023    Past Medical History:  Diagnosis Date   Abnormal LFTs 09/06/2011   Acute bronchitis 08/16/2010   Anxiety 09/06/2011   Chicken pox as a child   Cirrhosis (HCC)    Cutaneous skin tags 07/27/2016   Diabetes mellitus without complication (HCC)    pt denies   Fatigue    Gout    Gout    Grief reaction 09/09/2012   Hyperglycemia 09/06/2011   Hyperlipidemia 09/06/2011   Hypertension    Insomnia 08/16/2010   Knee pain, acute 02/28/2012   Low testosterone 02/28/2012   Measles as a child   Mumps as a child   Nocturia 08/16/2010   Obese    Preventative health care 09/06/2011   Sleep apnea 09/06/2011   Unspecified vitamin D deficiency 02/28/2012    Past Surgical History:  Procedure Laterality Date   BACK SURGERY     COLONOSCOPY  09/16/10   Dr. Arlyce Dice: moderate diverticulosis sigmoid and descending colon, o/w normal: repeat 10 yrs.   intestines sewn  73 yrs old   shot once made 6 holes   TONSILLECTOMY  as a child   UMBILICAL HERNIA REPAIR N/A 01/30/2023   Procedure: OPEN UMBILICAL HERNIA REPAIR;  Surgeon: Manus Rudd, MD;  Location: [facility] OR;  Service: General;  Laterality: N/A;   VASECTOMY  73 yrs old  Family History  Problem Relation Age of Onset   Diabetes Mother        type 2   Esophageal cancer Maternal Grandmother    Cancer Maternal Grandmother        throat   Heart disease Maternal Grandfather    Diabetes Maternal Grandfather    Depression Daughter        anxiety   Graves' disease Daughter    Colon polyps Neg Hx    Colon cancer Neg Hx    Rectal cancer Neg Hx    Stomach cancer Neg Hx     Social History   Socioeconomic History   Marital status: Married    Spouse name: Not on file   Number of children: 3   Years of education: Not on file   Highest education level: Bachelor's degree (e.g., BA, AB, BS)  Occupational History   Occupation: driver/partime  Tobacco Use   Smoking status: Former    Current packs/day: 0.00    Types:  Cigarettes    Quit date: 08/30/1968    Years since quitting: 54.5   Smokeless tobacco: Never   Tobacco comments:    smoked in high school  Vaping Use   Vaping status: Never Used  Substance and Sexual Activity   Alcohol use: Yes    Comment: maybe 1 beer once a month   Drug use: No   Sexual activity: Yes    Partners: Female  Other Topics Concern   Not on file  Social History Narrative   Not on file   Social Drivers of Health   Financial Resource Strain: Low Risk  (02/28/2023)   Overall Financial Resource Strain (CARDIA)    Difficulty of Paying Living Expenses: Not hard at all  Food Insecurity: No Food Insecurity (02/28/2023)   Hunger Vital Sign    Worried About Running Out of Food in the Last Year: Never true    Ran Out of Food in the Last Year: Never true  Transportation Needs: No Transportation Needs (02/28/2023)   PRAPARE - Administrator, Civil Service (Medical): No    Lack of Transportation (Non-Medical): No  Physical Activity: Insufficiently Active (02/28/2023)   Exercise Vital Sign    Days of Exercise per Week: 2 days    Minutes of Exercise per Session: 10 min  Stress: No Stress Concern Present (02/28/2023)   [employer] of Occupational Health - Occupational Stress Questionnaire    Feeling of Stress : Not at all  Social Connections: Socially Integrated (02/28/2023)   Social Connection and Isolation Panel [NHANES]    Frequency of Communication with Friends and Family: More than three times a week    Frequency of Social Gatherings with Friends and Family: More than three times a week    Attends Religious Services: More than 4 times per year    Active Member of [employer] or Organizations: Yes    Attends Engineer, structural: More than 4 times per year    Marital Status: Married  Catering manager Violence: Not At Risk (02/28/2023)   Humiliation, Afraid, Rape, and Kick questionnaire    Fear of Current or Ex-Partner: No    Emotionally Abused: No     Physically Abused: No    Sexually Abused: No    Outpatient Medications Prior to Visit  Medication Sig Dispense Refill   carvedilol (COREG) 12.5 MG tablet Take 1 tablet (12.5 mg total) by mouth daily. 90 tablet 5   fluocinolone 0.01 % cream Apply topically 2 (  two) times daily. 30 g 0   atorvastatin (LIPITOR) 10 MG tablet Take 1 tablet (10 mg total) by mouth at bedtime. (Patient not taking: Reported on 01/25/2023) 90 tablet 1   amLODipine (NORVASC) 5 MG tablet Take 1 tablet (5 mg total) by mouth daily. (Patient not taking: Reported on 01/25/2023) 90 tablet 1   azithromycin (ZITHROMAX) 250 MG tablet Take 1 tablet (250 mg total) by mouth daily. Take first 2 tablets together, then 1 every day until finished. (Patient not taking: Reported on 01/25/2023) 6 tablet 0   doxycycline (VIBRA-TABS) 100 MG tablet Take 1 tablet (100 mg total) by mouth 2 (two) times daily. (Patient not taking: Reported on 01/25/2023) 14 tablet 0   losartan-hydrochlorothiazide (HYZAAR) 100-25 MG tablet Take 1 tablet by mouth daily. for blood pressure (Patient not taking: Reported on 01/25/2023) 90 tablet 1   metFORMIN (GLUCOPHAGE) 1000 MG tablet Take 1 tablet (1,000 mg total) by mouth 2 (two) times daily with a meal. (Patient not taking: Reported on 01/25/2023) 180 tablet 1   methylcellulose oral powder Take 1 packet by mouth daily.     mupirocin ointment (BACTROBAN) 2 % Apply 1 Application topically 2 (two) times daily as needed. (Patient not taking: Reported on 01/25/2023) 22 g 1   oxyCODONE (OXY IR/ROXICODONE) 5 MG immediate release tablet Take 1 tablet (5 mg total) by mouth every 4 (four) hours as needed for moderate pain (pain score 4-6). 20 tablet 0   No facility-administered medications prior to visit.    No Known Allergies  ROS See HPI    Objective:    Physical Exam Constitutional:      General: He is not in acute distress.    Appearance: He is well-developed.  HENT:     Head: Normocephalic and atraumatic.     Right  Ear: Tympanic membrane and ear canal normal.     Left Ear: Tympanic membrane and ear canal normal.     Mouth/Throat:     Pharynx: Oropharynx is clear. No posterior oropharyngeal erythema.  Cardiovascular:     Rate and Rhythm: Normal rate and regular rhythm.     Heart sounds: No murmur heard. Pulmonary:     Effort: Pulmonary effort is normal. No respiratory distress.     Breath sounds: Wheezing (soft bilateral expiratory wheeze) present. No rales.  Musculoskeletal:     Cervical back: Neck supple. No tenderness.  Lymphadenopathy:     Cervical: No cervical adenopathy.  Skin:    General: Skin is warm and dry.  Neurological:     Mental Status: He is alert and oriented to person, place, and time.  Psychiatric:        Behavior: Behavior normal.        Thought Content: Thought content normal.      BP 126/61 (BP Location: Right Arm, Patient Position: Sitting, Cuff Size: Large)   Pulse 61   Temp 99.1 F (37.3 C) (Oral)   Resp 16   Ht 5\' 7"  (1.702 m)   Wt 243 lb (110.2 kg)   SpO2 96%   BMI 38.06 kg/m  Wt Readings from Last 3 Encounters:  03/08/23 243 lb (110.2 kg)  02/28/23 240 lb (108.9 kg)  01/30/23 249 lb (112.9 kg)       Assessment & Plan:   Problem List Items Addressed This Visit       Unprioritized   Viral URI with cough   Recent onset of nasal congestion, cough, and phlegm production. No fever or fatigue. Wheezing  noted on exam. Likely viral etiology. -Covid test negative today. -Prescribe inhaler, two puffs every six hours. -Recommend Mucinex for secretions. -Provide cough suppressant.       Type 2 diabetes mellitus with obesity (HCC)   Lab Results  Component Value Date   HGBA1C 5.2 01/27/2023   A1C has been stable without metformin.       Relevant Orders   Urine Microalbumin w/creat. ratio   Insomnia   Reports difficulty sleeping during periods of stress. Previous use of Xanax. -Provide one-time prescription for Xanax to manage current stress.        Relevant Medications   ALPRAZolam (XANAX) 0.5 MG tablet   Hypertension   BP stable on carvedilol. Monitor.       Cutaneous skin tags   Skin Tags: Two skin tags, one on neck and one on body. Patient requests removal. -Schedule appointment for skin tag removal.      Other Visit Diagnoses       Hyperbilirubinemia    -  Primary   Relevant Orders   Comp Met (CMET)   Urinalysis, Routine w reflex microscopic   Bilirubin, fractionated(tot/dir/indir)     Cataract of both eyes, unspecified cataract type       Relevant Orders   Ambulatory referral to Ophthalmology     Screening for colon cancer       Relevant Orders   Ambulatory referral to Gastroenterology     Screening for prostate cancer       Relevant Orders   PSA, Medicare ( [facility] only)       I have discontinued Anthonny L. Schartz "Sam"'s metFORMIN, losartan-hydrochlorothiazide, amLODipine, doxycycline, mupirocin ointment, azithromycin, methylcellulose, and oxyCODONE. I am also having him start on albuterol, benzonatate, and ALPRAZolam. Additionally, I am having him maintain his atorvastatin, carvedilol, and fluocinolone.  Meds ordered this encounter  Medications   albuterol (VENTOLIN HFA) 108 (90 Base) MCG/ACT inhaler    Sig: Inhale 2 puffs into the lungs every 6 (six) hours as needed for wheezing or shortness of breath.    Dispense:  8 g    Refill:  0    Supervising Provider:   Danise Edge A [[id number]]   benzonatate (TESSALON) 100 MG capsule    Sig: Take 1 capsule (100 mg total) by mouth 3 (three) times daily as needed.    Dispense:  20 capsule    Refill:  0    Supervising Provider:   Danise Edge A [[id number]]   ALPRAZolam (XANAX) 0.5 MG tablet    Sig: Take 1 tablet (0.5 mg total) by mouth at bedtime as needed for anxiety.    Dispense:  30 tablet    Refill:  0    Supervising Provider:   Danise Edge A [[id number]]

## 2023-03-08 NOTE — Assessment & Plan Note (Signed)
 Recent onset of nasal congestion, cough, and phlegm production. No fever or fatigue. Wheezing noted on exam. Likely viral etiology. -Covid test negative today. -Prescribe inhaler, two puffs every six hours. -Recommend Mucinex for secretions. -Provide cough suppressant.

## 2023-03-08 NOTE — Assessment & Plan Note (Signed)
 Skin Tags: Two skin tags, one on neck and one on body. Patient requests removal. -Schedule appointment for skin tag removal.

## 2023-03-08 NOTE — Assessment & Plan Note (Signed)
 Lab Results  Component Value Date   HGBA1C 5.2 01/27/2023   A1C has been stable without metformin.

## 2023-03-08 NOTE — Assessment & Plan Note (Signed)
 Reports difficulty sleeping during periods of stress. Previous use of Xanax. -Provide one-time prescription for Xanax to manage current stress.

## 2023-03-08 NOTE — Assessment & Plan Note (Signed)
 BP stable on carvedilol. Monitor.

## 2023-03-08 NOTE — Telephone Encounter (Addendum)
 He has some protein in his urine.   Because of the protein in his urine, I would like for him to add lisinopril 2.5mg . check bmet in 1 week.    PSA is normal.  Calcium is a bit low- I will check some other related tests at his lab draw.

## 2023-03-09 LAB — BILIRUBIN, FRACTIONATED(TOT/DIR/INDIR)
Bilirubin, Direct: 0.6 mg/dL — ABNORMAL HIGH (ref 0.0–0.2)
Indirect Bilirubin: 1.5 mg/dL — ABNORMAL HIGH (ref 0.2–1.2)
Total Bilirubin: 2.1 mg/dL — ABNORMAL HIGH (ref 0.2–1.2)

## 2023-03-14 NOTE — Telephone Encounter (Signed)
 Called patient but no answer, left voice mail for patient to call back.   Ok to Archivist

## 2023-03-16 NOTE — Telephone Encounter (Signed)
 Patient notified of results, provider's comments, new prescription and lab appointment needed.   He will like to know if he can come back in 2 weeks instead. Has appointment scheduled for 03/28/23 for follow up and can have labs same day.  Please advise if ok to wait or needs to have labs next week.

## 2023-03-17 ENCOUNTER — Telehealth: Payer: Self-pay | Admitting: Emergency Medicine

## 2023-03-17 NOTE — Telephone Encounter (Signed)
 Clarified rx is Lisinopril- he has the rx.  Pt verbalized understanding.

## 2023-03-17 NOTE — Telephone Encounter (Signed)
 Copied from CRM [id number]. Topic: Clinical - Prescription Issue >> Mar 17, 2023  4:33 PM Melissa C wrote: Reason for CRM: patient stated there were 2 prescriptions that Sandford Craze wanted him to get on right away at their last visit but he couldn't remember the names of them. He checked with his pharmacy- [place] and they don't have anything for him. He was hoping this could be expedited quickly since it has been several days. Please advise with pharmacy and with patient. Thank you

## 2023-03-28 ENCOUNTER — Telehealth: Payer: Self-pay | Admitting: Family

## 2023-03-28 ENCOUNTER — Ambulatory Visit (INDEPENDENT_AMBULATORY_CARE_PROVIDER_SITE_OTHER): Payer: Medicare Other | Admitting: Family

## 2023-03-28 DIAGNOSIS — H269 Unspecified cataract: Secondary | ICD-10-CM

## 2023-03-28 DIAGNOSIS — E785 Hyperlipidemia, unspecified: Secondary | ICD-10-CM

## 2023-03-28 DIAGNOSIS — E1169 Type 2 diabetes mellitus with other specified complication: Secondary | ICD-10-CM | POA: Diagnosis not present

## 2023-03-28 DIAGNOSIS — L918 Other hypertrophic disorders of the skin: Secondary | ICD-10-CM

## 2023-03-28 DIAGNOSIS — E559 Vitamin D deficiency, unspecified: Secondary | ICD-10-CM

## 2023-03-28 LAB — LIPID PANEL
Cholesterol: 143 mg/dL (ref 0–200)
HDL: 46.2 mg/dL (ref >39.00)
LDL Cholesterol: 71 mg/dL (ref 0–99)
NonHDL: 96.75
Total CHOL/HDL Ratio: 3
Triglycerides: 127 mg/dL (ref 0.0–149.0)
VLDL: 25.4 mg/dL (ref 0.0–40.0)

## 2023-03-28 LAB — BASIC METABOLIC PANEL
BUN: 17 mg/dL (ref 6–23)
CO2: 26 meq/L (ref 19–32)
Calcium: 8.8 mg/dL (ref 8.4–10.5)
Chloride: 108 meq/L (ref 96–112)
Creatinine, Ser: 0.9 mg/dL (ref 0.40–1.50)
GFR: 84.55 mL/min (ref >60.00)
Glucose, Bld: 126 mg/dL — ABNORMAL HIGH (ref 70–99)
Potassium: 4.1 meq/L (ref 3.5–5.1)
Sodium: 140 meq/L (ref 135–145)

## 2023-03-28 LAB — VITAMIN D 25 HYDROXY (VIT D DEFICIENCY, FRACTURES): VITD: 9.87 ng/mL — ABNORMAL LOW (ref 30.00–100.00)

## 2023-03-28 MED ORDER — VITAMIN D (ERGOCALCIFEROL) 1.25 MG (50000 UNIT) PO CAPS: 50000.0000 [IU] | ORAL_CAPSULE | ORAL | 0 refills | Status: DC

## 2023-03-28 NOTE — Assessment & Plan Note (Signed)
 It appears pt has self discontinued lipitor, update lipid panel today.

## 2023-03-28 NOTE — Addendum Note (Signed)
 Addended by: Sandford Craze on: 03/28/2023 02:44 PM   Modules accepted: Orders

## 2023-03-28 NOTE — Assessment & Plan Note (Signed)
 Consent signed. Used 1% lidocaine with Epi to infiltrate beneath each of the skin tags noted above that had been cleansed with betadine.  Used hyfrecator to reach hemostasis on each lesion. Pt tolerated procedure well. Small very benign appearing skin tags not sent for pathology-  but larger lesion right groin was sent.  Pt to keep clean and dry for 24 hours, call if drainage, redness occurs.

## 2023-03-28 NOTE — Progress Notes (Signed)
 Subjective:     Patient ID: David Esparza, male    DOB: May 30, 1950, 73 y.o.   MRN: [id number]  Chief Complaint  Patient presents with   Nevus    Patient here to have moles remove    HPI  Discussed the use of AI scribe software for clinical note transcription with the patient, who gave verbal consent to proceed.  History of Present Illness  Patient is a 73 yr old male who presents today for removal of multiple skin tags.    He is also due for follow up lab work to address recent finding of hypocalcemia.   Lab Results  Component Value Date   CHOL 146 02/22/2022   HDL 33.00 (L) 02/22/2022   LDLCALC 89 02/22/2022   LDLDIRECT 110.0 12/09/2020   TRIG 121.0 02/22/2022   CHOLHDL 4 02/22/2022     Health Maintenance Due  Topic Date Due   COVID-19 Vaccine (1) Never done   FOOT EXAM  Never done   DTaP/Tdap/Td (1 - Tdap) Never done   Zoster Vaccines- Shingrix (1 of 2) Never done   Colonoscopy  03/17/2023    Past Medical History:  Diagnosis Date   Abnormal LFTs 09/06/2011   Acute bronchitis 08/16/2010   Anxiety 09/06/2011   Chicken pox as a child   Cirrhosis (HCC)    Cutaneous skin tags 07/27/2016   Diabetes mellitus without complication (HCC)    pt denies   Fatigue    Gout    Gout    Grief reaction 09/09/2012   Hyperglycemia 09/06/2011   Hyperlipidemia 09/06/2011   Hypertension    Insomnia 08/16/2010   Knee pain, acute 02/28/2012   Liver cirrhosis secondary to NASH (HCC)    Low testosterone 02/28/2012   Measles as a child   Mumps as a child   Nocturia 08/16/2010   Obese    Preventative health care 09/06/2011   Sleep apnea 09/06/2011   Unspecified vitamin D deficiency 02/28/2012    Past Surgical History:  Procedure Laterality Date   BACK SURGERY     COLONOSCOPY  09/16/10   Dr. Arlyce Dice: moderate diverticulosis sigmoid and descending colon, o/w normal: repeat 10 yrs.   intestines sewn  73 yrs old   shot once made 6 holes   TONSILLECTOMY  as a child    UMBILICAL HERNIA REPAIR N/A 01/30/2023   Procedure: OPEN UMBILICAL HERNIA REPAIR;  Surgeon: Manus Rudd, MD;  Location: [facility] OR;  Service: General;  Laterality: N/A;   VASECTOMY  73 yrs old    Family History  Problem Relation Age of Onset   Diabetes Mother        type 2   Esophageal cancer Maternal Grandmother    Cancer Maternal Grandmother        throat   Heart disease Maternal Grandfather    Diabetes Maternal Grandfather    Depression Daughter        anxiety   Graves' disease Daughter    Colon polyps Neg Hx    Colon cancer Neg Hx    Rectal cancer Neg Hx    Stomach cancer Neg Hx     Social History   Socioeconomic History   Marital status: Married    Spouse name: Not on file   Number of children: 3   Years of education: Not on file   Highest education level: Bachelor's degree (e.g., BA, AB, BS)  Occupational History   Occupation: driver/partime  Tobacco Use   Smoking status: Former  Current packs/day: 0.00    Types: Cigarettes    Quit date: 08/30/1968    Years since quitting: 54.6   Smokeless tobacco: Never   Tobacco comments:    smoked in high school  Vaping Use   Vaping status: Never Used  Substance and Sexual Activity   Alcohol use: Yes    Comment: maybe 1 beer once a month   Drug use: No   Sexual activity: Yes    Partners: Female  Other Topics Concern   Not on file  Social History Narrative   Not on file   Social Drivers of Health   Financial Resource Strain: Low Risk  (02/28/2023)   Overall Financial Resource Strain (CARDIA)    Difficulty of Paying Living Expenses: Not hard at all  Food Insecurity: No Food Insecurity (02/28/2023)   Hunger Vital Sign    Worried About Running Out of Food in the Last Year: Never true    Ran Out of Food in the Last Year: Never true  Transportation Needs: No Transportation Needs (02/28/2023)   PRAPARE - Administrator, Civil Service (Medical): No    Lack of Transportation (Non-Medical): No  Physical  Activity: Insufficiently Active (02/28/2023)   Exercise Vital Sign    Days of Exercise per Week: 2 days    Minutes of Exercise per Session: 10 min  Stress: No Stress Concern Present (02/28/2023)   [employer] of Occupational Health - Occupational Stress Questionnaire    Feeling of Stress : Not at all  Social Connections: Socially Integrated (02/28/2023)   Social Connection and Isolation Panel [NHANES]    Frequency of Communication with Friends and Family: More than three times a week    Frequency of Social Gatherings with Friends and Family: More than three times a week    Attends Religious Services: More than 4 times per year    Active Member of [employer] or Organizations: Yes    Attends Engineer, structural: More than 4 times per year    Marital Status: Married  Catering manager Violence: Not At Risk (02/28/2023)   Humiliation, Afraid, Rape, and Kick questionnaire    Fear of Current or Ex-Partner: No    Emotionally Abused: No    Physically Abused: No    Sexually Abused: No    Outpatient Medications Prior to Visit  Medication Sig Dispense Refill   albuterol (VENTOLIN HFA) 108 (90 Base) MCG/ACT inhaler Inhale 2 puffs into the lungs every 6 (six) hours as needed for wheezing or shortness of breath. 8 g 0   ALPRAZolam (XANAX) 0.5 MG tablet Take 1 tablet (0.5 mg total) by mouth at bedtime as needed for anxiety. 30 tablet 0   atorvastatin (LIPITOR) 10 MG tablet Take 1 tablet (10 mg total) by mouth at bedtime. (Patient not taking: Reported on 01/25/2023) 90 tablet 1   carvedilol (COREG) 12.5 MG tablet Take 1 tablet (12.5 mg total) by mouth daily. 90 tablet 5   fluocinolone 0.01 % cream Apply topically 2 (two) times daily. 30 g 0   lisinopril (ZESTRIL) 2.5 MG tablet Take 1 tablet (2.5 mg total) by mouth daily. 90 tablet 1   benzonatate (TESSALON) 100 MG capsule Take 1 capsule (100 mg total) by mouth 3 (three) times daily as needed. 20 capsule 0   No facility-administered medications  prior to visit.    No Known Allergies  ROS    See HPI Objective:    Physical Exam Constitutional:      Appearance: Normal  appearance.  HENT:     Head: Normocephalic and atraumatic.  Pulmonary:     Effort: Pulmonary effort is normal.  Musculoskeletal:        General: No swelling.  Skin:    General: Skin is warm and dry.     Comments: 1 skin tag right neck 2 skin tags base of left neck 1 small right groin and one larger pedunculated tag (sent for pathology)  1 small skin tag left groin    Neurological:     Mental Status: He is alert and oriented to person, place, and time.  Psychiatric:        Mood and Affect: Mood normal.        Behavior: Behavior normal.        Thought Content: Thought content normal.      BP 126/62 (BP Location: Right Arm, Patient Position: Sitting)   Pulse 66   Temp 98.7 F (37.1 C) (Oral)   Resp 16   Ht 5\' 7"  (1.702 m)   Wt 249 lb (112.9 kg)   SpO2 97%   BMI 39.00 kg/m  Wt Readings from Last 3 Encounters:  03/28/23 249 lb (112.9 kg)  03/08/23 243 lb (110.2 kg)  02/28/23 240 lb (108.9 kg)       Assessment & Plan:   Problem List Items Addressed This Visit       Unprioritized   Hypocalcemia - Primary   Update labs as ordered.       Relevant Orders   PTH, intact (no Ca)   Vitamin D (25 hydroxy)   Basic Metabolic Panel (BMET)   Hyperlipidemia associated with type 2 diabetes mellitus (HCC)   It appears pt has self discontinued lipitor, update lipid panel today.       Relevant Orders   Lipid panel   Cutaneous skin tags   Consent signed. Used 1% lidocaine with Epi to infiltrate beneath each of the skin tags noted above that had been cleansed with betadine.  Used hyfrecator to reach hemostasis on each lesion. Pt tolerated procedure well. Small very benign appearing skin tags not sent for pathology-  but larger lesion right groin was sent.  Pt to keep clean and dry for 24 hours, call if drainage, redness occurs.        I have  discontinued Ponce L. Murdaugh "Sam"'s benzonatate. I am also having him maintain his atorvastatin, carvedilol, fluocinolone, albuterol, ALPRAZolam, and lisinopril.  No orders of the defined types were placed in this encounter.

## 2023-03-28 NOTE — Assessment & Plan Note (Signed)
 Update labs as ordered.

## 2023-03-28 NOTE — Telephone Encounter (Signed)
 Vitamin D level is low.  Advise patient to begin vit D 50000 units once weekly for 12 weeks, then repeat vit D level (dx Vit D deficiency).    Cholesterol looks good.  Please ask if he is taking lipitor?

## 2023-03-29 ENCOUNTER — Encounter: Payer: Self-pay | Admitting: Family

## 2023-03-29 LAB — PARATHYROID HORMONE, INTACT (NO CA): PTH: 28 pg/mL (ref 16–77)

## 2023-03-30 LAB — SURGICAL PATHOLOGY

## 2023-03-31 ENCOUNTER — Encounter: Payer: Self-pay | Admitting: Family

## 2023-04-11 DIAGNOSIS — R188 Other ascites: Secondary | ICD-10-CM | POA: Diagnosis not present

## 2023-04-11 DIAGNOSIS — K766 Portal hypertension: Secondary | ICD-10-CM | POA: Diagnosis not present

## 2023-04-11 DIAGNOSIS — K7460 Unspecified cirrhosis of liver: Secondary | ICD-10-CM | POA: Diagnosis not present

## 2023-04-11 DIAGNOSIS — K7581 Nonalcoholic steatohepatitis (NASH): Secondary | ICD-10-CM | POA: Diagnosis not present

## 2023-04-11 DIAGNOSIS — I8510 Secondary esophageal varices without bleeding: Secondary | ICD-10-CM | POA: Diagnosis not present

## 2023-04-11 DIAGNOSIS — K3189 Other diseases of stomach and duodenum: Secondary | ICD-10-CM | POA: Diagnosis not present

## 2023-04-14 ENCOUNTER — Telehealth: Payer: Self-pay

## 2023-04-14 ENCOUNTER — Encounter: Payer: Self-pay | Admitting: Emergency Medicine

## 2023-04-14 NOTE — Telephone Encounter (Signed)
 Copied from CRM [id number]. Topic: Referral - Status >> Apr 14, 2023  2:24 PM Florestine Avers wrote: Reason for CRM: Patient states that Dr. Abner Greenspan was supposed to send over a referral and he wanted to know the status of that.

## 2023-05-02 ENCOUNTER — Other Ambulatory Visit: Payer: Self-pay | Admitting: Family Medicine

## 2023-05-02 MED ORDER — ALBUTEROL SULFATE HFA 108 (90 BASE) MCG/ACT IN AERS: 2.0000 | INHALATION_SPRAY | Freq: Four times a day (QID) | RESPIRATORY_TRACT | 2 refills | Status: DC | PRN

## 2023-05-02 NOTE — Telephone Encounter (Signed)
 Copied from CRM [id number]. Topic: Clinical - Medication Refill >> May 02, 2023  3:41 PM [name] wrote: Most Recent Primary Care Visit:  Provider: O'SULLIVAN, MELISSA  Department: [facility]  Visit Type: OFFICE VISIT  Date: 03/28/2023  Medication: albuterol  (VENTOLIN  HFA) 108 (90 Base) MCG/ACT inhaler  Has the patient contacted their pharmacy? Yes (Agent: If no, request that the patient contact the pharmacy for the refill. If patient does not wish to contact the pharmacy document the reason why and proceed with request.) (Agent: If yes, when and what did the pharmacy advise?)  Is this the correct pharmacy for this prescription? Yes If no, delete pharmacy and type the correct one.  This is the patient's preferred pharmacy:  [facility] - [place], [place] - [address] [place] [address] Phone: [phone] Fax: [phone]   Has the prescription been filled recently? No  Is the patient out of the medication? Yes  Has the patient been seen for an appointment in the last year OR does the patient have an upcoming appointment? Yes  Can we respond through MyChart? No  Agent: Please be advised that Rx refills may take up to 3 business days. We ask that you follow-up with your pharmacy.

## 2023-06-13 ENCOUNTER — Other Ambulatory Visit: Payer: Self-pay | Admitting: Nurse Practitioner

## 2023-06-13 DIAGNOSIS — R188 Other ascites: Secondary | ICD-10-CM

## 2023-06-13 DIAGNOSIS — I8510 Secondary esophageal varices without bleeding: Secondary | ICD-10-CM

## 2023-06-13 DIAGNOSIS — K7460 Unspecified cirrhosis of liver: Secondary | ICD-10-CM

## 2023-06-14 ENCOUNTER — Ambulatory Visit
Admission: RE | Admit: 2023-06-14 | Discharge: 2023-06-14 | Disposition: A | Discharge status: Home / Self Care | Source: Ambulatory Visit | Attending: Nurse Practitioner | Admitting: Nurse Practitioner

## 2023-06-14 DIAGNOSIS — K7460 Unspecified cirrhosis of liver: Secondary | ICD-10-CM | POA: Diagnosis not present

## 2023-06-14 DIAGNOSIS — R188 Other ascites: Secondary | ICD-10-CM

## 2023-06-14 DIAGNOSIS — I8510 Secondary esophageal varices without bleeding: Secondary | ICD-10-CM

## 2023-06-15 ENCOUNTER — Other Ambulatory Visit: Payer: Self-pay | Admitting: Family Medicine

## 2023-06-15 MED ORDER — FLUOCINOLONE ACETONIDE 0.01 % EX CREA: TOPICAL_CREAM | Freq: Two times a day (BID) | CUTANEOUS | 0 refills | Status: DC

## 2023-06-15 NOTE — Telephone Encounter (Signed)
 Copied from CRM [id number]. Topic: Clinical - Medication Refill >> Jun 15, 2023 10:39 AM Juleen Oakland F wrote: Medication: fluocinolone  0.01 % cream    Has the patient contacted their pharmacy? Yes (Agent: If no, request that the patient contact the pharmacy for the refill. If patient does not wish to contact the pharmacy document the reason why and proceed with request.) (Agent: If yes, when and what did the pharmacy advise?)  This is the patient's preferred pharmacy:   [facility] - [place], [place] - [address] [place] [address] Phone: [phone] Fax: [phone]   Is this the correct pharmacy for this prescription? Yes If no, delete pharmacy and type the correct one.   Has the prescription been filled recently? Yes  Is the patient out of the medication? No  Has the patient been seen for an appointment in the last year OR does the patient have an upcoming appointment? Yes  Can we respond through MyChart? No  Agent: Please be advised that Rx refills may take up to 3 business days. We ask that you follow-up with your pharmacy.

## 2023-06-26 ENCOUNTER — Other Ambulatory Visit (INDEPENDENT_AMBULATORY_CARE_PROVIDER_SITE_OTHER)

## 2023-06-26 DIAGNOSIS — E559 Vitamin D deficiency, unspecified: Secondary | ICD-10-CM | POA: Diagnosis not present

## 2023-06-26 NOTE — Addendum Note (Signed)
 Addended by: Marigene Shoulder on: 06/26/2023 08:17 AM   Modules accepted: Orders

## 2023-06-26 NOTE — Telephone Encounter (Signed)
 Copied from CRM [id number]. Topic: Clinical - Prescription Issue >> Jun 26, 2023  9:20 AM Mesmerise C wrote: Reason for CRM: David Esparza from [facility] patient requested fluocinonide cream 0.1% cream instead of fluocinolone  0.01 % creamto be called in had it once in 2019 helped with his rash better.

## 2023-06-27 ENCOUNTER — Telehealth: Payer: Self-pay | Admitting: Family

## 2023-06-27 DIAGNOSIS — E559 Vitamin D deficiency, unspecified: Secondary | ICD-10-CM

## 2023-06-27 LAB — VITAMIN D 25 HYDROXY (VIT D DEFICIENCY, FRACTURES): Vit D, 25-Hydroxy: 33 ng/mL (ref 30–100)

## 2023-06-27 MED ORDER — VITAMIN D3 75 MCG (3000 UT) PO TABS: 1.0000 | ORAL_TABLET | Freq: Every day | ORAL | Status: DC

## 2023-06-27 NOTE — Telephone Encounter (Signed)
 Follow up vit D level is normal. He can stop weekly vit D and begin otc vit D 3000 international units daily.

## 2023-06-28 NOTE — Telephone Encounter (Signed)
 Patient notified of results and changes

## 2023-07-06 DIAGNOSIS — H2513 Age-related nuclear cataract, bilateral: Secondary | ICD-10-CM | POA: Diagnosis not present

## 2023-07-06 DIAGNOSIS — H353131 Nonexudative age-related macular degeneration, bilateral, early dry stage: Secondary | ICD-10-CM | POA: Diagnosis not present

## 2023-08-03 ENCOUNTER — Ambulatory Visit: Payer: Self-pay

## 2023-08-03 ENCOUNTER — Ambulatory Visit (HOSPITAL_BASED_OUTPATIENT_CLINIC_OR_DEPARTMENT_OTHER)
Admission: RE | Admit: 2023-08-03 | Discharge: 2023-08-03 | Disposition: A | Discharge status: Home / Self Care | Source: Ambulatory Visit | Attending: Family Medicine | Admitting: Family Medicine

## 2023-08-03 ENCOUNTER — Ambulatory Visit (INDEPENDENT_AMBULATORY_CARE_PROVIDER_SITE_OTHER): Admitting: Family Medicine

## 2023-08-03 ENCOUNTER — Ambulatory Visit: Payer: Self-pay | Admitting: Family Medicine

## 2023-08-03 ENCOUNTER — Encounter: Payer: Self-pay | Admitting: Family Medicine

## 2023-08-03 VITALS — BP 130/74 | HR 78 | Temp 98.0°F | Resp 16 | Ht 67.0 in | Wt 252.8 lb

## 2023-08-03 DIAGNOSIS — R0609 Other forms of dyspnea: Secondary | ICD-10-CM | POA: Diagnosis not present

## 2023-08-03 DIAGNOSIS — J90 Pleural effusion, not elsewhere classified: Secondary | ICD-10-CM

## 2023-08-03 DIAGNOSIS — R918 Other nonspecific abnormal finding of lung field: Secondary | ICD-10-CM | POA: Diagnosis not present

## 2023-08-03 MED ORDER — ALBUTEROL SULFATE HFA 108 (90 BASE) MCG/ACT IN AERS: 2.0000 | INHALATION_SPRAY | Freq: Four times a day (QID) | RESPIRATORY_TRACT | 2 refills | Status: DC | PRN

## 2023-08-03 NOTE — Addendum Note (Signed)
 Addended by: FRANN MABEL SQUIBB on: 08/03/2023 05:46 PM   Modules accepted: Level of Service

## 2023-08-03 NOTE — Progress Notes (Addendum)
 Chief Complaint  Patient presents with   Shortness of Breath    Shortness of Breath     David Esparza here for URI complaints.  Duration: 2 weeks  Associated symptoms: wheezing, shortness of breath, and coughing slight, wt gain of 10 lbs in past mo Has been working on a project more so at home and finds he is more short of breath. Denies: sinus congestion, sinus pain, rhinorrhea, itchy watery eyes, ear pain, ear drainage, sore throat, myalgia, bleeding, wt loss, swelling, chest pain, jaw/arm pain, and fevers Treatment to date: none Sick contacts: No  Past Medical History:  Diagnosis Date   Abnormal LFTs 09/06/2011   Acute bronchitis 08/16/2010   Anxiety 09/06/2011   Chicken pox as a child   Cirrhosis (HCC)    Cutaneous skin tags 07/27/2016   Diabetes mellitus without complication (HCC)    pt denies   Fatigue    Gout    Gout    Grief reaction 09/09/2012   Hyperglycemia 09/06/2011   Hyperlipidemia 09/06/2011   Hypertension    Insomnia 08/16/2010   Knee pain, acute 02/28/2012   Liver cirrhosis secondary to NASH (HCC)    Low testosterone  02/28/2012   Measles as a child   Mumps as a child   Nocturia 08/16/2010   Obese    Preventative health care 09/06/2011   Sleep apnea 09/06/2011   Unspecified vitamin D  deficiency 02/28/2012    Objective BP 130/74 (BP Location: Left Arm, Patient Position: Sitting)   Pulse 78   Temp 98 F (36.7 C) (Oral)   Resp 16   Ht 5' 7 (1.702 m)   Wt 252 lb 12.8 oz (114.7 kg)   SpO2 95%   BMI 39.59 kg/m  General: Awake, alert, appears stated age HEENT: AT, , ears patent b/l and TM's neg, nares patent w/o discharge, pharynx pink and without exudates, MMM Neck: No masses or asymmetry MSK: No calf TTP Heart: RRR, 2+ pitting bilateral edema tapering at the knees Lungs: CTAB, no accessory muscle use but not great effort Psych: Age appropriate judgment and insight, normal mood and affect  DOE (dyspnea on exertion) - Plan: CBC,  Comprehensive metabolic panel with GFR, Magnesium, DG Chest 2 View  Ck CXR, labs and EKG.   EKG shows NSR, normal axis, no interval abnormalities, no ST segment or T wave changes, good R wave progression.   CXR showed pleural effusion, possible atelectasis. Will refer to pulm urgently for management. If worsening SOB, he will seek care at ER.  F/u as originally scheduled.  Pt voiced understanding and agreement to the plan.  I spent 44 min with the pt discussing the above plan in addition to reviewing imaging, his chart and coordinating care/result management with the radiology team.   Mabel Deward Pry, DO 08/03/23 4:29 PM

## 2023-08-03 NOTE — Patient Instructions (Addendum)
 Give us  2-3 business days to get the results of your labs back.   We will be in touch with your X-ray results. If everything is negative, we will check a scan of your heart.   Stay active.  If symptoms worsen, seek immediate care.   Let us  know if you need anything.

## 2023-08-03 NOTE — Telephone Encounter (Signed)
 FYI Only or Action Required?: FYI only for provider.  Patient was last seen in primary care on 03/28/2023 by Daryl Setter, NP.  Called Nurse Triage reporting Shortness of Breath.  Symptoms began 1-2 weeks ago.  Interventions attempted: Rest, hydration, or home remedies.  Symptoms are: unchanged.  Triage Disposition: See HCP Within 4 Hours (Or PCP Triage)  Patient/caregiver understands and will follow disposition?: Yes     Copied from CRM [id number]. Topic: Clinical - Red Word Triage >> Aug 03, 2023  2:01 PM Deleta RAMAN wrote: Red Word that prompted transfer to Nurse Triage: patient is experiencing shortness of breath and wheezing      Reason for Disposition  [1] MILD difficulty breathing (e.g., minimal/no SOB at rest, SOB with walking, pulse < 100) AND [2] NEW-onset or WORSE than normal  Answer Assessment - Initial Assessment Questions 1. RESPIRATORY STATUS: Describe your breathing? (e.g., wheezing, shortness of breath, unable to speak, severe coughing)      If I walk a block I get more short of breath than normal 2. ONSET: When did this breathing problem begin?      1-2 weeks 3. PATTERN Does the difficult breathing come and go, or has it been constant since it started?      Intermittent  4. SEVERITY: How bad is your breathing? (e.g., mild, moderate, severe)      Mild  5. RECURRENT SYMPTOM: Have you had difficulty breathing before? If Yes, ask: When was the last time? and What happened that time?      Similar 4 years ago  6. CARDIAC HISTORY: Do you have any history of heart disease? (e.g., heart attack, angina, bypass surgery, angioplasty)      No 7. LUNG HISTORY: Do you have any history of lung disease?  (e.g., pulmonary embolus, asthma, emphysema)     No 8. CAUSE: What do you think is causing the breathing problem?      Unsure  9. OTHER SYMPTOMS: Do you have any other symptoms? (e.g., chest pain, cough, dizziness, fever, runny nose)      Wheezing  Protocols used: Breathing Difficulty-A-AH

## 2023-08-04 LAB — CBC
HCT: 40.7 % (ref 39.0–52.0)
Hemoglobin: 14 g/dL (ref 13.0–17.0)
MCHC: 34.5 g/dL (ref 30.0–36.0)
MCV: 100.4 fl — ABNORMAL HIGH (ref 78.0–100.0)
Platelets: 63 K/uL — ABNORMAL LOW (ref 150.0–400.0)
RBC: 4.05 Mil/uL — ABNORMAL LOW (ref 4.22–5.81)
RDW: 14.7 % (ref 11.5–15.5)
WBC: 5.2 K/uL (ref 4.0–10.5)

## 2023-08-04 LAB — COMPREHENSIVE METABOLIC PANEL WITH GFR
ALT: 24 U/L (ref 0–53)
AST: 36 U/L (ref 0–37)
Albumin: 3.1 g/dL — ABNORMAL LOW (ref 3.5–5.2)
Alkaline Phosphatase: 95 U/L (ref 39–117)
BUN: 18 mg/dL (ref 6–23)
CO2: 26 meq/L (ref 19–32)
Calcium: 8.6 mg/dL (ref 8.4–10.5)
Chloride: 108 meq/L (ref 96–112)
Creatinine, Ser: 0.98 mg/dL (ref 0.40–1.50)
GFR: 76.15 mL/min (ref >60.00)
Glucose, Bld: 114 mg/dL — ABNORMAL HIGH (ref 70–99)
Potassium: 4.4 meq/L (ref 3.5–5.1)
Sodium: 141 meq/L (ref 135–145)
Total Bilirubin: 2.2 mg/dL — ABNORMAL HIGH (ref 0.2–1.2)
Total Protein: 6.4 g/dL (ref 6.0–8.3)

## 2023-08-04 LAB — MAGNESIUM: Magnesium: 1.8 mg/dL (ref 1.5–2.5)

## 2023-08-15 ENCOUNTER — Encounter (HOSPITAL_BASED_OUTPATIENT_CLINIC_OR_DEPARTMENT_OTHER): Payer: Self-pay | Admitting: Pulmonary Disease

## 2023-08-15 ENCOUNTER — Ambulatory Visit (INDEPENDENT_AMBULATORY_CARE_PROVIDER_SITE_OTHER): Admitting: Pulmonary Disease

## 2023-08-15 ENCOUNTER — Other Ambulatory Visit (HOSPITAL_COMMUNITY)
Admission: RE | Admit: 2023-08-15 | Discharge: 2023-08-15 | Disposition: A | Discharge status: Home / Self Care | Attending: Pulmonary Disease | Admitting: Pulmonary Disease

## 2023-08-15 VITALS — BP 165/90 | HR 79 | Ht 67.0 in | Wt 252.0 lb

## 2023-08-15 DIAGNOSIS — K7581 Nonalcoholic steatohepatitis (NASH): Secondary | ICD-10-CM

## 2023-08-15 DIAGNOSIS — K7460 Unspecified cirrhosis of liver: Secondary | ICD-10-CM | POA: Diagnosis not present

## 2023-08-15 DIAGNOSIS — D696 Thrombocytopenia, unspecified: Secondary | ICD-10-CM | POA: Diagnosis not present

## 2023-08-15 DIAGNOSIS — J90 Pleural effusion, not elsewhere classified: Secondary | ICD-10-CM | POA: Diagnosis not present

## 2023-08-15 DIAGNOSIS — K769 Liver disease, unspecified: Secondary | ICD-10-CM | POA: Diagnosis not present

## 2023-08-15 DIAGNOSIS — J918 Pleural effusion in other conditions classified elsewhere: Secondary | ICD-10-CM | POA: Diagnosis not present

## 2023-08-15 LAB — LACTATE DEHYDROGENASE, PLEURAL OR PERITONEAL FLUID: LD, Fluid: 59 U/L — ABNORMAL HIGH (ref 3–23)

## 2023-08-15 LAB — BODY FLUID CELL COUNT WITH DIFFERENTIAL
Eos, Fluid: 0 %
Lymphs, Fluid: 64 %
Monocyte-Macrophage-Serous Fluid: 28 % — ABNORMAL LOW (ref 50–90)
Neutrophil Count, Fluid: 8 % (ref 0–25)
Total Nucleated Cell Count, Fluid: 297 uL (ref 0–1000)

## 2023-08-15 NOTE — Patient Instructions (Signed)
 Thank you for coming today. We did a thoracentesis on you today. We will send off the fluid for analysis. I'll bring you back in 2-3 weeks to discuss results. Please get CXR tomorrow.

## 2023-08-15 NOTE — Progress Notes (Signed)
 Thoracentesis  Procedure Note  David Esparza  [id number]  [birth year]  Date:08/15/23  Time:5:15 PM   Provider Performing:Iveliz Garay Esparza   Procedure: Thoracentesis with imaging guidance (67444)  Indication(s) Pleural Effusion  Consent Risks of the procedure as well as the alternatives and risks of each were explained to the patient and/or caregiver.  Consent for the procedure was obtained and is signed in the bedside chart  Anesthesia Topical only with 1% lidocaine     Time Out Verified patient identification, verified procedure, site/side was marked, verified correct patient position, special equipment/implants available, medications/allergies/relevant history reviewed, required imaging and test results available.   Sterile Technique Maximal sterile technique including full sterile barrier drape, hand hygiene, sterile gown, sterile gloves, mask, hair covering, sterile ultrasound probe cover (if used).  Procedure Description Ultrasound was used to identify appropriate pleural anatomy for placement and overlying skin marked.  Area of drainage cleaned and draped in sterile fashion. Lidocaine  was used to anesthetize the skin and subcutaneous tissue.  1650 cc's of yellow turbid appearing fluid was drained from the right pleural space. Catheter then removed and bandaid applied to site.   Complications/Tolerance None; patient tolerated the procedure well. Chest X-ray is ordered to confirm no post-procedural complication.  EBL Minimal  Specimen(s) Pleural fluid                       David CATHERINE, MD

## 2023-08-15 NOTE — Progress Notes (Signed)
 New Patient Pulmonology Office Visit   Subjective:  Patient ID: David Esparza, male    DOB: [birth year]  MRN: [id number]  Referred by: Frann Mabel Mt*  CC:  Chief Complaint  Patient presents with   Establish Care    HPI David Esparza is a 73 y.o. male with HTN, HLD, Morbid Obesity, NASH cirrhosis, and Bell's Palsy who presents for initial evaluation of dyspnea due to pleural effusion.  Seen by PCP on 08/03/2023 and CXR was obtained. Concern for pleural effusion. Therefore, referred urgently to pulmonology for initial evaluation.  He had this one day a year ago where he had SOB and went to ED, did not have anything identified. Given inhaler and did not use it. It self resolved. This year now he had dyspnea on exertion and wheezing. No chest pain. He sleeps, gets up in the AM and in AM goes in shower and now feels short of breath. It started a couple of weeks ago. SOB was not acute, it was gradual in onset. He lost around 20 lbs over the last month. He is careful what he ate. He does not have a strong appetite. He is trying to eat better. No fevers, chills, or night sweats. No orthopnea. + Swelling in his legs. He has some coughing earlier in the year but not recently. No phlegm or hemoptysis. Albuterol  helps short period of time.  PMH:  - HTN - NASH Cirrhosis with portal HTN  PSH:  - Hernia operation 01/2023  Medications: no blood thinners, reviewed below.  Allergies: none  Social Hx: - Smoking quit in 1975, 1 pack x 5 years = 5 PY  - Second hand smoking, night clubs - yes  - Vaping: none  - Alcohol: none  - Illicit substances: none  - Indoor emission from household combustion: none  - Hobbies (e.g. wood work, Surveyor, quantity, bird breeding etc...): none - Environmental (e.g. mold): none  - Pets: dog  - Birds: none   Occupational exposures: self employed since 1975, financial services [ ]  Asbestos - Holiday representative workers, Medical sales representative, Therapist, sports, railroad, Development worker, international aid radiation - uranium mining [ ]  Vinyl chloride - pulp & paper workers  [ ]  Arsenic - smelting of ores, Chief Executive Officer, wood preservation  [ ]  Beryllium - Archivist workers, Geophysical data processor, Pensions consultant workers, jewelers  [ ]  chromium - Financial trader, welding, tanning industries  [ ]  Nickel - Chief Strategy Officer, Naval architect, Physiological scientist, glass workers, Health and safety inspector, metal workers   Family Hx:  - Lung disease none    - Cancer none   ROS  Allergies: Patient has no known allergies.  Current Outpatient Medications:    albuterol  (VENTOLIN  HFA) 108 (90 Base) MCG/ACT inhaler, Inhale 2 puffs into the lungs every 6 (six) hours as needed for wheezing or shortness of breath., Disp: 8 g, Rfl: 2   ALPRAZolam  (XANAX ) 0.5 MG tablet, Take 1 tablet (0.5 mg total) by mouth at bedtime as needed for anxiety., Disp: 30 tablet, Rfl: 0   atorvastatin  (LIPITOR) 10 MG tablet, Take 1 tablet (10 mg total) by mouth at bedtime. (Patient not taking: Reported on 01/25/2023), Disp: 90 tablet, Rfl: 1   carvedilol  (COREG ) 12.5 MG tablet, Take 1 tablet (12.5 mg total) by mouth daily., Disp: 90 tablet, Rfl: 5   Cholecalciferol (VITAMIN D3) 75 MCG (3000 UT) TABS, Take 1 tablet by mouth daily at 6 (six) AM., Disp: , Rfl:    fluocinolone  0.01 % cream, Apply topically 2 (  two) times daily., Disp: 30 g, Rfl: 0   lisinopril  (ZESTRIL ) 2.5 MG tablet, Take 1 tablet (2.5 mg total) by mouth daily., Disp: 90 tablet, Rfl: 1   Vitamin D , Ergocalciferol , (DRISDOL ) 1.25 MG (50000 UNIT) CAPS capsule, Take 1 capsule (50,000 Units total) by mouth every 7 (seven) days., Disp: 12 capsule, Rfl: 0 Past Medical History:  Diagnosis Date   Abnormal LFTs 09/06/2011   Acute bronchitis 08/16/2010   Anxiety 09/06/2011   Chicken pox as a child   Cirrhosis (HCC)    Cutaneous skin tags 07/27/2016   Diabetes mellitus without complication (HCC)    pt denies   Fatigue    Gout    Gout    Grief reaction  09/09/2012   Hyperglycemia 09/06/2011   Hyperlipidemia 09/06/2011   Hypertension    Insomnia 08/16/2010   Knee pain, acute 02/28/2012   Liver cirrhosis secondary to NASH (HCC)    Low testosterone  02/28/2012   Measles as a child   Mumps as a child   Nocturia 08/16/2010   Obese    Preventative health care 09/06/2011   Sleep apnea 09/06/2011   Unspecified vitamin D  deficiency 02/28/2012   Past Surgical History:  Procedure Laterality Date   BACK SURGERY     COLONOSCOPY  09/16/10   Dr. Debrah: moderate diverticulosis sigmoid and descending colon, o/w normal: repeat 10 yrs.   intestines sewn  73 yrs old   shot once made 6 holes   TONSILLECTOMY  as a child   UMBILICAL HERNIA REPAIR N/A 01/30/2023   Procedure: OPEN UMBILICAL HERNIA REPAIR;  Surgeon: Belinda Cough, MD;  Location: [facility] OR;  Service: General;  Laterality: N/A;   VASECTOMY  73 yrs old   Family History  Problem Relation Age of Onset   Diabetes Mother        type 2   Esophageal cancer Maternal Grandmother    Cancer Maternal Grandmother        throat   Heart disease Maternal Grandfather    Diabetes Maternal Grandfather    Depression Daughter        anxiety   Graves' disease Daughter    Colon polyps Neg Hx    Colon cancer Neg Hx    Rectal cancer Neg Hx    Stomach cancer Neg Hx    Social History   Socioeconomic History   Marital status: Married    Spouse name: Not on file   Number of children: 3   Years of education: Not on file   Highest education level: Bachelor's degree (e.g., BA, AB, BS)  Occupational History   Occupation: driver/partime  Tobacco Use   Smoking status: Former    Current packs/day: 0.00    Types: Cigarettes    Quit date: 08/30/1968    Years since quitting: 54.9   Smokeless tobacco: Never   Tobacco comments:    smoked in high school  Vaping Use   Vaping status: Never Used  Substance and Sexual Activity   Alcohol use: Yes    Comment: maybe 1 beer once a month   Drug use: No   Sexual  activity: Yes    Partners: Female  Other Topics Concern   Not on file  Social History Narrative   Not on file   Social Drivers of Health   Financial Resource Strain: Low Risk  (02/28/2023)   Overall Financial Resource Strain (CARDIA)    Difficulty of Paying Living Expenses: Not hard at all  Food Insecurity: No Food Insecurity (02/28/2023)  Hunger Vital Sign    Worried About Running Out of Food in the Last Year: Never true    Ran Out of Food in the Last Year: Never true  Transportation Needs: No Transportation Needs (02/28/2023)   PRAPARE - Administrator, Civil Service (Medical): No    Lack of Transportation (Non-Medical): No  Physical Activity: Insufficiently Active (02/28/2023)   Exercise Vital Sign    Days of Exercise per Week: 2 days    Minutes of Exercise per Session: 10 min  Stress: No Stress Concern Present (02/28/2023)   [employer] of Occupational Health - Occupational Stress Questionnaire    Feeling of Stress : Not at all  Social Connections: Socially Integrated (02/28/2023)   Social Connection and Isolation Panel    Frequency of Communication with Friends and Family: More than three times a week    Frequency of Social Gatherings with Friends and Family: More than three times a week    Attends Religious Services: More than 4 times per year    Active Member of [employer] or Organizations: Yes    Attends Engineer, structural: More than 4 times per year    Marital Status: Married  Catering manager Violence: Not At Risk (02/28/2023)   Humiliation, Afraid, Rape, and Kick questionnaire    Fear of Current or Ex-Partner: No    Emotionally Abused: No    Physically Abused: No    Sexually Abused: No       Objective:  BP (!) 165/90 (BP Location: Left Arm, Patient Position: Sitting, Cuff Size: Large)   Pulse 79   Ht 5' 7 (1.702 m)   Wt 252 lb (114.3 kg)   SpO2 95%   BMI 39.47 kg/m  Wt Readings from Last 3 Encounters:  08/15/23 252 lb (114.3 kg)   08/03/23 252 lb 12.8 oz (114.7 kg)  03/28/23 249 lb (112.9 kg)   BMI Readings from Last 3 Encounters:  08/15/23 39.47 kg/m  08/03/23 39.59 kg/m  03/28/23 39.00 kg/m   SpO2 Readings from Last 3 Encounters:  08/15/23 95%  08/03/23 95%  03/28/23 97%   Physical Exam: General: NAD, alert, WD, WN Eyes: PERRL, no scleral icterus ENMT: oropharynx clear, good dentition, no oral lesions, mallampati score IV, subconjunctival hemorrhage in L eye, icterus Skin: warm, intact, spider angiomas, palmar erythema Neck: JVD++, ROM and lymph node assessment normal CV: RRR, no MRG, nl S1 and S2, 2+ pitting peripheral edema Resp: decreased breath sounds in right mid and lower lung field. POCUS shows large anechoic pleural effusion up to 12cm deep with compressive atelectasis of right lung Abdom: Normoactive bowel sounds, soft, nontender, nondistended, no hepatosplenomegaly Neuro: Awake alert oriented to person place time and situation   Diagnostic Review:  Last CBC Lab Results  Component Value Date   WBC 5.2 08/03/2023   HGB 14.0 08/03/2023   HCT 40.7 08/03/2023   MCV 100.4 (H) 08/03/2023   MCH 34.7 (H) 01/31/2023   RDW 14.7 08/03/2023   PLT 63.0 (L) 08/03/2023   Last metabolic panel Lab Results  Component Value Date   GLUCOSE 114 (H) 08/03/2023   NA 141 08/03/2023   K 4.4 08/03/2023   CL 108 08/03/2023   CO2 26 08/03/2023   BUN 18 08/03/2023   CREATININE 0.98 08/03/2023   GFR 76.15 08/03/2023   CALCIUM  8.6 08/03/2023   PHOS 3.5 09/11/2012   PROT 6.4 08/03/2023   ALBUMIN  3.1 (L) 08/03/2023   BILITOT 2.2 (H) 08/03/2023   ALKPHOS 95  08/03/2023   AST 36 08/03/2023   ALT 24 08/03/2023   ANIONGAP 7 01/31/2023      CXR completed on 7/24 reviewed independently and shows large right sided pleural effusion with blunting of R CP angle Assessment & Plan:   Assessment & Plan Pleural effusion Patient presented to the clinic today for right-sided pleural effusion.  On POCUS evaluation,  the patient had a very large right-sided anechoic pleural effusion with compressive atelectasis of the right lower lobe.  The effusion spanned from chest wall to 12 cm deep.  The decision was made to pursue thoracentesis in the clinic after informed consent and periprocedural time out was performed.  Informed consent was obtained from the patient.  1650 mL of yellow hazy fluid was drained from the right pleural space.  I suspect this may be due to history of Hollie cirrhosis with portal hypertension.  Pleural fluid studies including cell counts, chemistries, microbiology, and cytology was sent to the lab for evaluation.  I will see the patient in 3 weeks to discuss results.  He will x-ray tomorrow post procedurally to rule out pneumothorax.  The patient was counseled on warning signs of the latter.  If the pleural effusion was transudate, the patient likely needs to be on diuretic therapy for portal hypertension. Pleural effusion associated with hepatic disorder Suspect this is due to right hepatic hydrothorax.  If pleural effusion is a transudate will start diuretic therapy. Thrombocytopenia (HCC) Likely due to cirrhosis and portal hypertension. Liver cirrhosis secondary to NASH [facility]) Follows with gastroenterology and hepatology.  He likely needs follow-up for suspected right hepatic hydrothorax.  Orders Placed This Encounter  Procedures   Fungus Culture with Smear   AFB culture with smear   Body fluid culture   DG Chest 2 View   Lactate dehydrogenase, fluid - peritoneal or pleural   Body fluid cell count with differential   Cholesterol, body fluid   Protein, body fluid (other)    I spent 90 minutes reviewing patient's chart including prior consultant notes, imaging, labs, and performing thoracentesis as well as face-to-face with the patient, over half in discussion of the diagnosis and the importance of compliance with the treatment plan.  Return in about 3 weeks (around 09/05/2023).   Carlina Derks, MD               Lotta Frankenfield, MD

## 2023-08-16 ENCOUNTER — Telehealth (HOSPITAL_BASED_OUTPATIENT_CLINIC_OR_DEPARTMENT_OTHER): Payer: Self-pay | Admitting: Pulmonary Disease

## 2023-08-16 ENCOUNTER — Other Ambulatory Visit (HOSPITAL_COMMUNITY)
Admission: RE | Admit: 2023-08-16 | Discharge: 2023-08-16 | Disposition: A | Discharge status: Home / Self Care | Source: Other Acute Inpatient Hospital | Attending: Family Medicine | Admitting: Family Medicine

## 2023-08-16 ENCOUNTER — Other Ambulatory Visit (HOSPITAL_BASED_OUTPATIENT_CLINIC_OR_DEPARTMENT_OTHER): Payer: Self-pay

## 2023-08-16 DIAGNOSIS — J918 Pleural effusion in other conditions classified elsewhere: Secondary | ICD-10-CM | POA: Diagnosis not present

## 2023-08-16 DIAGNOSIS — J90 Pleural effusion, not elsewhere classified: Secondary | ICD-10-CM | POA: Insufficient documentation

## 2023-08-16 DIAGNOSIS — K769 Liver disease, unspecified: Secondary | ICD-10-CM | POA: Diagnosis not present

## 2023-08-16 LAB — PATHOLOGIST SMEAR REVIEW

## 2023-08-16 NOTE — Addendum Note (Signed)
 Addended by: Garek Schuneman on: 08/16/2023 01:09 PM   Modules accepted: Orders

## 2023-08-16 NOTE — Addendum Note (Signed)
 Addended by: Stacy Deshler L on: 08/16/2023 01:26 PM   Modules accepted: Orders

## 2023-08-16 NOTE — Addendum Note (Signed)
 Addended by: Maliyah Willets on: 08/16/2023 10:36 AM   Modules accepted: Orders

## 2023-08-16 NOTE — Addendum Note (Signed)
 Addended byBETHA ANNELLA COUGH on: 08/16/2023 01:16 PM   Modules accepted: Orders

## 2023-08-16 NOTE — Telephone Encounter (Signed)
 David Esparza has an emergency call when I returned her call and David Esparza says call her back in about 30 mins

## 2023-08-16 NOTE — Telephone Encounter (Signed)
 I called lab and sorted this out!

## 2023-08-16 NOTE — Addendum Note (Signed)
 Addended by: Chizaram Latino L on: 08/16/2023 02:59 PM   Modules accepted: Orders

## 2023-08-16 NOTE — Telephone Encounter (Signed)
 Here is the message we discussed [phone] is [place] number

## 2023-08-17 ENCOUNTER — Ambulatory Visit (INDEPENDENT_AMBULATORY_CARE_PROVIDER_SITE_OTHER)

## 2023-08-17 ENCOUNTER — Ambulatory Visit (HOSPITAL_BASED_OUTPATIENT_CLINIC_OR_DEPARTMENT_OTHER)
Admission: RE | Admit: 2023-08-17 | Discharge: 2023-08-17 | Disposition: A | Discharge status: Home / Self Care | Source: Ambulatory Visit | Attending: Pulmonary Disease | Admitting: Pulmonary Disease

## 2023-08-17 ENCOUNTER — Ambulatory Visit (HOSPITAL_BASED_OUTPATIENT_CLINIC_OR_DEPARTMENT_OTHER): Payer: Self-pay | Admitting: Pulmonary Disease

## 2023-08-17 ENCOUNTER — Encounter (HOSPITAL_BASED_OUTPATIENT_CLINIC_OR_DEPARTMENT_OTHER): Payer: Self-pay

## 2023-08-17 DIAGNOSIS — J90 Pleural effusion, not elsewhere classified: Secondary | ICD-10-CM

## 2023-08-17 DIAGNOSIS — R918 Other nonspecific abnormal finding of lung field: Secondary | ICD-10-CM | POA: Diagnosis not present

## 2023-08-17 DIAGNOSIS — Z48813 Encounter for surgical aftercare following surgery on the respiratory system: Secondary | ICD-10-CM | POA: Diagnosis not present

## 2023-08-17 LAB — CYTOLOGY - NON PAP

## 2023-08-18 LAB — CHOLESTEROL, BODY FLUID: Cholesterol, Fluid: 27 mg/dL

## 2023-08-18 LAB — ACID FAST SMEAR (AFB, MYCOBACTERIA): Acid Fast Smear: NEGATIVE

## 2023-08-18 NOTE — Assessment & Plan Note (Signed)
 Follows with gastroenterology and hepatology.  He likely needs follow-up for suspected right hepatic hydrothorax.

## 2023-08-19 LAB — BODY FLUID CULTURE W GRAM STAIN
Culture: NO GROWTH
Gram Stain: NONE SEEN

## 2023-08-21 ENCOUNTER — Ambulatory Visit (HOSPITAL_BASED_OUTPATIENT_CLINIC_OR_DEPARTMENT_OTHER): Payer: Self-pay | Admitting: Pulmonary Disease

## 2023-08-21 ENCOUNTER — Telehealth (HOSPITAL_BASED_OUTPATIENT_CLINIC_OR_DEPARTMENT_OTHER): Payer: Self-pay

## 2023-08-21 DIAGNOSIS — J918 Pleural effusion in other conditions classified elsewhere: Secondary | ICD-10-CM

## 2023-08-21 MED ORDER — FUROSEMIDE 40 MG PO TABS: 40.0000 mg | ORAL_TABLET | Freq: Every day | ORAL | 3 refills | Status: DC

## 2023-08-21 NOTE — Telephone Encounter (Signed)
 FYI Only or Action Required?: FYI only for provider.  Patient is followed in Pulmonology for dyspnea, last seen on 08/15/2023 by Catherine Cools, MD. First visit to pulmonary  Called Nurse Triage reporting Shortness of Breath.  Symptoms began yesterday.  Interventions attempted: Nothing.  Symptoms are: stable.  Triage Disposition: See Physician Within 24 Hours (overriding See HCP Within 4 Hours (Or PCP Triage)) patient requested to be seen at 4:00 PM today or tomorrow. Agreed to PCP appointment for tomorrow at 11:30 AM  Patient/caregiver understands and will follow disposition?: Yes  Copied from CRM #[id number]. Topic: Clinical - Red Word Triage >> Aug 21, 2023 10:29 AM David Esparza wrote: Red Word that prompted transfer to Nurse Triage: Pt is having SOB and was recently seen to drain fluid from his lung. Pt believe there's a build up of fluid again. Pt sees Dr. Catherine at the [facility]. Pt prefers to be seen after 4pm today or tomorrow morning. Reason for Disposition  [1] MILD difficulty breathing (e.g., minimal/no SOB at rest, SOB with walking, pulse < 100) AND [2] NEW-onset or WORSE than normal  Answer Assessment - Initial Assessment Questions 1. RESPIRATORY STATUS: Describe your breathing? (e.g., wheezing, shortness of breath, unable to speak, severe coughing)      Shortness of breath 2. ONSET: When did this breathing problem begin?      Yesterday afternoon 3. PATTERN Does the difficult breathing come and go, or has it been constant since it started?      Comes and goes-more present with activity 4. SEVERITY: How bad is your breathing? (e.g., mild, moderate, severe)      Currently no shortness of breath-with activity mild shortness of breath 5. RECURRENT SYMPTOM: Have you had difficulty breathing before? If Yes, ask: When was the last time? and What happened that time?      yes 6. CARDIAC HISTORY: Do you have any history of heart disease? (e.g., heart attack,  angina, bypass surgery, angioplasty)      no 7. LUNG HISTORY: Do you have any history of lung disease?  (e.g., pulmonary embolus, asthma, emphysema)     Dyspnea, pleural effusion 8. CAUSE: What do you think is causing the breathing problem?      Possible fluid buildup 9. OTHER SYMPTOMS: Do you have any other symptoms? (e.g., chest pain, cough, dizziness, fever, runny nose)     No other symptoms 10. O2 SATURATION MONITOR:  Do you use an oxygen saturation monitor (pulse oximeter) at home? If Yes, ask: What is your reading (oxygen level) today? What is your usual oxygen saturation reading? (e.g., 95%)       Patient reports that he doesn't have an oxygen saturation moniro 12. TRAVEL: Have you traveled out of the country in the last month? (e.g., travel history, exposures)       No.  Patient was seen on 08/15/2023 and had fluid removed from lung. Patient reports mild shortness of breath with activity that started yesterday. Patient is concerned that he has fluid building up again. Shortness of breath is mild and only present with exertional activity. Patient speaking in full sentences with no distress. Patient is asking to be seen sooner by pulmonary. Discussed with patient the options of PCP or Emergency department to be evaluated sooner. Patient asked to see if he can be scheduled with PCP. No availability today but appointment scheduled for tomorrow at 11:30 AM with PCP office. Patient's main objective is to see if he has fluid building up again. Patient verbalized  understanding and all questions answered.  Protocols used: Breathing Difficulty-A-AH

## 2023-08-21 NOTE — Telephone Encounter (Signed)
 I called the patient and informed him to start lasix  40 mg daily to help diuresis this pleural effusion. I agree with PCP follow up tomorrw. I will see him in clinic in two weeks to evaluate pleural effusion and decide on repeat drainage. This is likely a hepatic hydrothorax.

## 2023-08-21 NOTE — Telephone Encounter (Signed)
 Copied from CRM #[id number]. Topic: General - Other >> Aug 18, 2023  3:47 PM Shona S wrote: Reason for CRM: patient would like to know if he can be  prescribed a fill to help the fluid from building up in his lungs

## 2023-08-21 NOTE — Telephone Encounter (Signed)
 FYI

## 2023-08-22 ENCOUNTER — Encounter: Payer: Self-pay | Admitting: Family Medicine

## 2023-08-22 ENCOUNTER — Ambulatory Visit (INDEPENDENT_AMBULATORY_CARE_PROVIDER_SITE_OTHER): Admitting: Family Medicine

## 2023-08-22 VITALS — BP 128/74 | HR 68 | Temp 97.8°F | Resp 16 | Ht 67.0 in | Wt 248.6 lb

## 2023-08-22 DIAGNOSIS — J90 Pleural effusion, not elsewhere classified: Secondary | ICD-10-CM

## 2023-08-22 DIAGNOSIS — R062 Wheezing: Secondary | ICD-10-CM

## 2023-08-22 MED ORDER — ALBUTEROL SULFATE HFA 108 (90 BASE) MCG/ACT IN AERS: 2.0000 | INHALATION_SPRAY | Freq: Four times a day (QID) | RESPIRATORY_TRACT | 2 refills | Status: DC | PRN

## 2023-08-22 NOTE — Progress Notes (Signed)
 Chief Complaint  Patient presents with   Shortness of Breath    Shortness of Breath    Subjective: Patient is a 73 y.o. male here for f/u.  Patient has continued shortness of breath.  He was diagnosed with a pleural effusion on the right side and evaluated by pulmonology.  He had a thoracentesis with many results normal and some of the results pending.  He was prescribed albuterol  which does help.  He is requesting a refill.  He has intermittent wheezing.  No fevers.  Past Medical History:  Diagnosis Date   Abnormal LFTs 09/06/2011   Acute bronchitis 08/16/2010   Anxiety 09/06/2011   Chicken pox as a child   Cirrhosis (HCC)    Cutaneous skin tags 07/27/2016   Diabetes mellitus without complication (HCC)    pt denies   Fatigue    Gout    Gout    Grief reaction 09/09/2012   Hyperglycemia 09/06/2011   Hyperlipidemia 09/06/2011   Hypertension    Insomnia 08/16/2010   Knee pain, acute 02/28/2012   Liver cirrhosis secondary to NASH (HCC)    Low testosterone  02/28/2012   Measles as a child   Mumps as a child   Nocturia 08/16/2010   Obese    Preventative health care 09/06/2011   Sleep apnea 09/06/2011   Unspecified vitamin D  deficiency 02/28/2012    Objective: BP 128/74 (BP Location: Left Arm, Patient Position: Sitting)   Pulse 68   Temp 97.8 F (36.6 C) (Oral)   Resp 16   Ht 5' 7 (1.702 m)   Wt 248 lb 9.6 oz (112.8 kg)   SpO2 95%   BMI 38.94 kg/m  General: Awake, appears stated age Heart: RRR Lungs: Mostly clear to auscultation bilaterally with decreased breath sounds noted over the right lower lung field, no rales, wheezes or rhonchi. No accessory muscle use Psych: Age appropriate judgment and insight, normal affect and mood  Assessment and Plan: Pleural effusion  Wheezing - Plan: albuterol  (VENTOLIN  HFA) 108 (90 Base) MCG/ACT inhaler  Appreciate pulmonology input.  Probably still there.  He has a follow-up with them in the next couple weeks.  If he has  worsening symptoms, he will seek immediate care. Refill albuterol  as above.  This was reportedly helpful.  I did not appreciate any wheezing today. Follow-up with regular PCP as originally scheduled. The patient voiced understanding and agreement to the plan.  Mabel [place], DO 08/22/23  12:31 PM

## 2023-08-22 NOTE — Patient Instructions (Addendum)
 OK to use the inhaler as needed.  If things get worse, go to the ER.  Let us  know if you need anything.

## 2023-08-30 ENCOUNTER — Ambulatory Visit: Payer: Self-pay | Admitting: Pulmonary Disease

## 2023-08-30 NOTE — Telephone Encounter (Signed)
 FYI Only or Action Required?: FYI only for provider.  Patient was last seen on 08/15/2023 by Catherine Cools, MD.  Called Nurse Triage reporting Shortness of Breath.  Symptoms began several days ago.  Symptoms are: gradually worsening.  Triage Disposition: See HCP Within 4 Hours (Or PCP Triage)  Patient/caregiver understands and will follow disposition?: Unsure   Patient will call back or go to the ED for worsening symptoms.        Copied from CRM [id number]. Topic: Clinical - Red Word Triage >> Aug 30, 2023 11:35 AM David Esparza wrote: Red Word that prompted transfer to Nurse Triage: pt has water builtup on hisl ung/ SOB         Reason for Disposition  [1] MILD difficulty breathing (e.g., minimal/no SOB at rest, SOB with walking, pulse < 100) AND [2] NEW-onset or WORSE than normal  Answer Assessment - Initial Assessment Questions Patient has an appointment 8/25 already scheduled and he states he is unable to come in today for an appointment. Patient advised to call back for worsening symptoms or to go to the ED if needed. Patient verbalized understanding and agreement with this plan. His current appointment has been added to the wait list.       1. RESPIRATORY STATUS: Describe your breathing? (e.g., wheezing, shortness of breath, unable to speak, severe coughing)      Shortness of breath  2. ONSET: When did this breathing problem begin?      3-4 days ago  3. PATTERN Does the difficult breathing come and go, or has it been constant since it started?      Constant  4. SEVERITY: How bad is your breathing? (e.g., mild, moderate, severe)      Mild to moderate   5. RECURRENT SYMPTOM: Have you had difficulty breathing before? If Yes, ask: When was the last time? and What happened that time?      Yes, recently had pleural effusion drained  6. CARDIAC HISTORY: Do you have any history of heart disease? (e.g., heart attack, angina, bypass surgery, angioplasty)       No 7. LUNG HISTORY: Do you have any history of lung disease?  (e.g., pulmonary embolus, asthma, emphysema)     Yes 8. CAUSE: What do you think is causing the breathing problem?      Possible pleural effusion  9. OTHER SYMPTOMS: Do you have any other symptoms? (e.g., chest pain, cough, dizziness, fever, runny nose)     No 10. O2 SATURATION MONITOR:  Do you use an oxygen saturation monitor (pulse oximeter) at home? If Yes, ask: What is your reading (oxygen level) today? What is your usual oxygen saturation reading? (e.g., 95%)       No  Protocols used: Breathing Difficulty-A-AH

## 2023-08-30 NOTE — Telephone Encounter (Signed)
 FYI

## 2023-08-31 ENCOUNTER — Other Ambulatory Visit: Payer: Self-pay | Admitting: Pulmonary Disease

## 2023-08-31 DIAGNOSIS — J90 Pleural effusion, not elsewhere classified: Secondary | ICD-10-CM

## 2023-08-31 NOTE — Telephone Encounter (Signed)
 Called patient. He notes he's feeling better today. He's using the diuretic I prescribed him and peeing a lot which helps. He will see me on Monday. I recommended getting his BMP and Mg done before coming in.

## 2023-09-04 ENCOUNTER — Other Ambulatory Visit (HOSPITAL_COMMUNITY)
Admission: RE | Admit: 2023-09-04 | Discharge: 2023-09-04 | Disposition: A | Discharge status: Home / Self Care | Attending: Pulmonary Disease | Admitting: Pulmonary Disease

## 2023-09-04 ENCOUNTER — Encounter (HOSPITAL_BASED_OUTPATIENT_CLINIC_OR_DEPARTMENT_OTHER): Payer: Self-pay | Admitting: Pulmonary Disease

## 2023-09-04 ENCOUNTER — Telehealth (HOSPITAL_BASED_OUTPATIENT_CLINIC_OR_DEPARTMENT_OTHER): Payer: Self-pay | Admitting: Pulmonary Disease

## 2023-09-04 ENCOUNTER — Ambulatory Visit (HOSPITAL_BASED_OUTPATIENT_CLINIC_OR_DEPARTMENT_OTHER)

## 2023-09-04 ENCOUNTER — Ambulatory Visit (INDEPENDENT_AMBULATORY_CARE_PROVIDER_SITE_OTHER): Admitting: Pulmonary Disease

## 2023-09-04 VITALS — BP 122/70 | HR 78 | Ht 67.0 in | Wt 239.0 lb

## 2023-09-04 DIAGNOSIS — J90 Pleural effusion, not elsewhere classified: Secondary | ICD-10-CM

## 2023-09-04 DIAGNOSIS — J918 Pleural effusion in other conditions classified elsewhere: Secondary | ICD-10-CM

## 2023-09-04 DIAGNOSIS — K769 Liver disease, unspecified: Secondary | ICD-10-CM

## 2023-09-04 DIAGNOSIS — Z48813 Encounter for surgical aftercare following surgery on the respiratory system: Secondary | ICD-10-CM | POA: Diagnosis not present

## 2023-09-04 DIAGNOSIS — I771 Stricture of artery: Secondary | ICD-10-CM | POA: Diagnosis not present

## 2023-09-04 LAB — BODY FLUID CELL COUNT WITH DIFFERENTIAL
Eos, Fluid: 0 %
Lymphs, Fluid: 75 %
Monocyte-Macrophage-Serous Fluid: 18 % — ABNORMAL LOW (ref 50–90)
Neutrophil Count, Fluid: 7 % (ref 0–25)
Total Nucleated Cell Count, Fluid: 250 uL (ref 0–1000)

## 2023-09-04 LAB — LACTATE DEHYDROGENASE, PLEURAL OR PERITONEAL FLUID: LD, Fluid: 84 U/L — ABNORMAL HIGH (ref 3–23)

## 2023-09-04 NOTE — Telephone Encounter (Signed)
 He is scheduled for his first appointment on 09/08/23. We noted your preference for these appointments to occur every 4 weeks.  Central Scheduling informed us  that a new order is required for each appointment, and the easiest way to avoid any scheduling issues is to have multiple orders entered in advance. This will help ensure that the patient doesn't miss any appointments due to a missing order.  Please advise if you would like us  to proceed with multiple orders so we can schedule future appointments accordingly.

## 2023-09-04 NOTE — Patient Instructions (Addendum)
-   Please get your CXR done today - Please get your blood drawn today. - Will call you or send message in mychart about results of pleural fluid analysis - I set up a referral to interventional radiology so that they can drain your fluid every 4 weeks if needed - Continue lasix  40 mg daily. Once I see your blood test, will decide on increasing lasix  or not.

## 2023-09-04 NOTE — Telephone Encounter (Signed)
 Order will need to be changed US  thoracentesis. If they do interventiona radiology it will go to the wrong department

## 2023-09-04 NOTE — Telephone Encounter (Signed)
 Patient needs a therapeutic thoracentesis every 4 weeks for Right pleural effusion with interventional radiology. Please drain at least 1.5 L every 4 weeks.  Will route message to [facility] pool for scheduling purposes.

## 2023-09-04 NOTE — Addendum Note (Signed)
 Addended by: Viktorya Arguijo on: 09/04/2023 10:30 PM   Modules accepted: Orders

## 2023-09-04 NOTE — Telephone Encounter (Signed)
 Order placed in the outpatient encounter. Thanks for letting me know.

## 2023-09-04 NOTE — Progress Notes (Addendum)
 Established Patient Pulmonology Office Visit   Subjective:  Patient ID: David Esparza, male    DOB: Sep 28, 1950  MRN: [id number]  CC:  Chief Complaint  Patient presents with   Follow-up    Pleural Effusion    HPI  David Esparza is a 73 y.o. male with HTN, HLD, Morbid Obesity, NASH cirrhosis, and Bell's Palsy who presents for initial evaluation of dyspnea due to pleural effusion.   He is doing the diuretic. Lost 3-4 lbs. He is working so sometimes take it at night. Did not get the blood tests. Initially when we drained he felt better for a week and then it started coming back and now it got worse and when he wakes up out breath. Leg swelling goes down.  Medications: - Lasix  40 mg daily  ROS    Current Outpatient Medications:    albuterol  (VENTOLIN  HFA) 108 (90 Base) MCG/ACT inhaler, Inhale 2 puffs into the lungs every 6 (six) hours as needed for wheezing or shortness of breath., Disp: 18 g, Rfl: 2   ALPRAZolam  (XANAX ) 0.5 MG tablet, Take 1 tablet (0.5 mg total) by mouth at bedtime as needed for anxiety., Disp: 30 tablet, Rfl: 0   atorvastatin  (LIPITOR) 10 MG tablet, Take 1 tablet (10 mg total) by mouth at bedtime., Disp: 90 tablet, Rfl: 1   carvedilol  (COREG ) 12.5 MG tablet, Take 1 tablet (12.5 mg total) by mouth daily., Disp: 90 tablet, Rfl: 5   Cholecalciferol (VITAMIN D3) 75 MCG (3000 UT) TABS, Take 1 tablet by mouth daily at 6 (six) AM., Disp: , Rfl:    fluocinolone  0.01 % cream, Apply topically 2 (two) times daily., Disp: 30 g, Rfl: 0   furosemide  (LASIX ) 40 MG tablet, Take 1 tablet (40 mg total) by mouth daily., Disp: 30 tablet, Rfl: 3   lisinopril  (ZESTRIL ) 2.5 MG tablet, Take 1 tablet (2.5 mg total) by mouth daily., Disp: 90 tablet, Rfl: 1   Vitamin D , Ergocalciferol , (DRISDOL ) 1.25 MG (50000 UNIT) CAPS capsule, Take 1 capsule (50,000 Units total) by mouth every 7 (seven) days., Disp: 12 capsule, Rfl: 0      Objective:  BP 122/70   Pulse 78   Ht 5' 7 (1.702 m)    Wt 239 lb (108.4 kg)   SpO2 94%   BMI 37.43 kg/m  Wt Readings from Last 3 Encounters:  09/04/23 239 lb (108.4 kg)  08/22/23 248 lb 9.6 oz (112.8 kg)  08/15/23 252 lb (114.3 kg)   BMI Readings from Last 3 Encounters:  09/04/23 37.43 kg/m  08/22/23 38.94 kg/m  08/15/23 39.47 kg/m   SpO2 Readings from Last 3 Encounters:  09/04/23 94%  08/22/23 95%  08/15/23 95%    Physical Exam General: NAD, alert, WD, WN Eyes: PERRL, no scleral icterus ENMT: oropharynx clear, good dentition, no oral lesions, mallampati score III Skin: warm, intact, no rashes Neck: JVD elevated, ROM and lymph node assessment normal CV: RRR, no MRG, nl S1 and S2, 2+ pitting edema RLE > LLE Resp: decreased breath sounds in right lower lung field. POCUS ultrasound shows a large anechoic pleural effusion on right lung as deep as 15 cm with complete atelectasis of RLL Abdom: Normoactive bowel sounds, soft, nontender, nondistended, no hepatosplenomegaly Ext: edema Neuro: Awake alert oriented to person place time and situation  Diagnostic Review:  Last CBC Lab Results  Component Value Date   WBC 5.2 08/03/2023   HGB 14.0 08/03/2023   HCT 40.7 08/03/2023   MCV 100.4 (H)  08/03/2023   MCH 34.7 (H) 01/31/2023   RDW 14.7 08/03/2023   PLT 63.0 (L) 08/03/2023   Last metabolic panel Lab Results  Component Value Date   GLUCOSE 114 (H) 08/03/2023   NA 141 08/03/2023   K 4.4 08/03/2023   CL 108 08/03/2023   CO2 26 08/03/2023   BUN 18 08/03/2023   CREATININE 0.98 08/03/2023   GFR 76.15 08/03/2023   CALCIUM  8.6 08/03/2023   PHOS 3.5 09/11/2012   PROT 6.4 08/03/2023   ALBUMIN  3.1 (L) 08/03/2023   BILITOT 2.2 (H) 08/03/2023   ALKPHOS 95 08/03/2023   AST 36 08/03/2023   ALT 24 08/03/2023   ANIONGAP 7 01/31/2023   Pleural Fluid Data from 08/15/2023: - pleural fluid cholesterol 27 - pleural fluid LDH 59 - WBC 297, 64% lymphs - CYTOLOGY NEGATIVE - Microbiology including AFB and fungal NEGATIVE      Assessment & Plan:   Assessment & Plan Pleural effusion associated with hepatic disorder Now s/p thoracentesis with 3 L drained. Reviewed post procedure CXR and there's no pneumothorax. I called patient with that result. I suspect patient will continue to require recurrent thoracentesis in the future given accumulation of fluid rapidly. Therefore, I put in a referral to IR for monthly US  thoracentesis with therapeutic aspiration. Pleural effusion Lymphocytic pleural effusion that is transudative likely related to hepatic disorder with hx of cirrhosis. I did a thoracentesis today and drained around 3 L off. The patient is currently on lasix  40 mg daily which I started  a few weeks ago. His weight is down by 13 lbs but patient still appears hypervolemic on examination. We will get his BMP and then likely to double up lasix  to 80 mg daily.   Orders Placed This Encounter  Procedures   AFB stain   Fungus culture   Body Fluid Culture   DG Chest 2 View   US  THORACENTESIS ASP PLEURAL SPACE W/IMG GUIDE   IR Radiologist Eval & Mgmt   Body fluid cell count with differential   Cholesterol, body fluid   Lactate dehydrogenase, body fluid   Basic Metabolic Panel (BMET)   Ambulatory referral to Interventional Radiology   I spent 65 minutes reviewing patient's chart including prior consultant notes, imaging, and PFTs as well as face-to-face with the patient, over half in discussion of the diagnosis and the importance of compliance with the treatment plan.  Thoracentesis  Procedure Note  LANARD ARGUIJO  [id number]  03-Feb-1950  Date:09/04/23  Time:12:55 PM   Provider Performing:Darrek Leasure CATHERINE   Procedure: Thoracentesis with imaging guidance ([id number])  Indication(s) Pleural Effusion  Consent Risks of the procedure as well as the alternatives and risks of each were explained to the patient and/or caregiver.  Consent for the procedure was obtained and is signed in the bedside  chart  Anesthesia Topical only with 1% lidocaine     Time Out Verified patient identification, verified procedure, site/side was marked, verified correct patient position, special equipment/implants available, medications/allergies/relevant history reviewed, required imaging and test results available.   Sterile Technique Maximal sterile technique including full sterile barrier drape, hand hygiene, sterile gown, sterile gloves, mask, hair covering, sterile ultrasound probe cover (if used).  Procedure Description Ultrasound was used to identify appropriate pleural anatomy for placement and overlying skin marked.  Area of drainage cleaned and draped in sterile fashion. Lidocaine  was used to anesthetize the skin and subcutaneous tissue.  3100 cc's of YELLOW appearing fluid was drained from the right pleural space. Catheter then removed and  bandaid applied to site.   Complications/Tolerance None; patient tolerated the procedure well. Chest X-ray is ordered to confirm no post-procedural complication.   EBL Minimal   Specimen(s) Pleural fluid   Return in about 2 months (around 11/04/2023).   Rontrell Moquin, MD

## 2023-09-05 ENCOUNTER — Other Ambulatory Visit (HOSPITAL_BASED_OUTPATIENT_CLINIC_OR_DEPARTMENT_OTHER): Payer: Self-pay

## 2023-09-05 DIAGNOSIS — J918 Pleural effusion in other conditions classified elsewhere: Secondary | ICD-10-CM

## 2023-09-05 DIAGNOSIS — J948 Other specified pleural conditions: Secondary | ICD-10-CM | POA: Diagnosis not present

## 2023-09-05 DIAGNOSIS — K769 Liver disease, unspecified: Secondary | ICD-10-CM | POA: Diagnosis not present

## 2023-09-05 LAB — BASIC METABOLIC PANEL WITH GFR
BUN/Creatinine Ratio: 19 (ref 10–24)
BUN: 20 mg/dL (ref 8–27)
CO2: 25 mmol/L (ref 20–29)
Calcium: 9.1 mg/dL (ref 8.6–10.2)
Chloride: 104 mmol/L (ref 96–106)
Creatinine, Ser: 1.06 mg/dL (ref 0.76–1.27)
Glucose: 81 mg/dL (ref 70–99)
Potassium: 3.9 mmol/L (ref 3.5–5.2)
Sodium: 142 mmol/L (ref 134–144)
eGFR: 74 mL/min/1.73 (ref >59)

## 2023-09-05 MED ORDER — FUROSEMIDE 40 MG PO TABS: 40.0000 mg | ORAL_TABLET | Freq: Two times a day (BID) | ORAL | 3 refills | Status: DC

## 2023-09-05 NOTE — Addendum Note (Signed)
 Addended by: TRUDY WARREN CROME on: 09/05/2023 02:29 PM   Modules accepted: Orders

## 2023-09-05 NOTE — Telephone Encounter (Signed)
 Pt has been scheduled and is made aware. NFN

## 2023-09-07 DIAGNOSIS — H04123 Dry eye syndrome of bilateral lacrimal glands: Secondary | ICD-10-CM | POA: Diagnosis not present

## 2023-09-07 DIAGNOSIS — H2513 Age-related nuclear cataract, bilateral: Secondary | ICD-10-CM | POA: Diagnosis not present

## 2023-09-07 LAB — BODY FLUID CULTURE W GRAM STAIN
Culture: NO GROWTH
Gram Stain: NONE SEEN

## 2023-09-07 LAB — CYTOLOGY - NON PAP

## 2023-09-07 LAB — CHOLESTEROL, BODY FLUID: Cholesterol, Fluid: 23 mg/dL

## 2023-09-08 ENCOUNTER — Telehealth (HOSPITAL_BASED_OUTPATIENT_CLINIC_OR_DEPARTMENT_OTHER): Payer: Self-pay

## 2023-09-08 ENCOUNTER — Ambulatory Visit (HOSPITAL_BASED_OUTPATIENT_CLINIC_OR_DEPARTMENT_OTHER): Payer: Self-pay | Admitting: Pulmonary Disease

## 2023-09-08 ENCOUNTER — Ambulatory Visit (HOSPITAL_COMMUNITY)
Admission: RE | Admit: 2023-09-08 | Discharge: 2023-09-08 | Disposition: A | Discharge status: Home / Self Care | Source: Ambulatory Visit | Attending: Pulmonary Disease | Admitting: Pulmonary Disease

## 2023-09-08 ENCOUNTER — Ambulatory Visit (HOSPITAL_COMMUNITY)
Admission: RE | Admit: 2023-09-08 | Discharge: 2023-09-08 | Disposition: A | Discharge status: Home / Self Care | Source: Ambulatory Visit | Attending: Radiology | Admitting: Radiology

## 2023-09-08 DIAGNOSIS — J90 Pleural effusion, not elsewhere classified: Secondary | ICD-10-CM | POA: Insufficient documentation

## 2023-09-08 DIAGNOSIS — J9811 Atelectasis: Secondary | ICD-10-CM | POA: Insufficient documentation

## 2023-09-08 DIAGNOSIS — I959 Hypotension, unspecified: Secondary | ICD-10-CM | POA: Diagnosis not present

## 2023-09-08 DIAGNOSIS — K769 Liver disease, unspecified: Secondary | ICD-10-CM | POA: Diagnosis not present

## 2023-09-08 DIAGNOSIS — J948 Other specified pleural conditions: Secondary | ICD-10-CM | POA: Insufficient documentation

## 2023-09-08 DIAGNOSIS — R918 Other nonspecific abnormal finding of lung field: Secondary | ICD-10-CM | POA: Diagnosis not present

## 2023-09-08 DIAGNOSIS — J918 Pleural effusion in other conditions classified elsewhere: Secondary | ICD-10-CM | POA: Insufficient documentation

## 2023-09-08 DIAGNOSIS — Z48813 Encounter for surgical aftercare following surgery on the respiratory system: Secondary | ICD-10-CM | POA: Diagnosis not present

## 2023-09-08 MED ORDER — LIDOCAINE-EPINEPHRINE (PF) 2 %-1:200000 IJ SOLN
INTRAMUSCULAR | Status: AC
  Filled 2023-09-08: qty 20

## 2023-09-08 NOTE — Telephone Encounter (Signed)
 I attempted to call without answer.

## 2023-09-08 NOTE — Telephone Encounter (Signed)
 Copied from CRM #[id number]. Topic: General - Other >> Sep 08, 2023 12:05 PM Rilla NOVAK wrote: Franky, [place] from IV @ [facility] regarding procedure patient had today. Called [place] (x3) and then reached out to [place], [place]. Franky disconnected.  Please call Interventional Radiology [facility]) @ [phone].

## 2023-09-08 NOTE — Procedures (Signed)
 Ultrasound-guided  therapeutic right thoracentesis performed yielding 2.3 liters of turbid, amber/light tan-colored fluid. No immediate complications. Follow-up chest x-ray revealed no pneumothorax.  EBL none.  Due to patient hypotension only  the above amount of fluid was removed today.

## 2023-09-11 NOTE — Telephone Encounter (Signed)
 Per conversation with dr Catherine this maybe regarding labs and he advised them on lab draw already

## 2023-09-14 NOTE — Telephone Encounter (Signed)
 The Pt is calling to be recommend for a diet that will help him with his liver

## 2023-09-14 NOTE — Telephone Encounter (Signed)
 I spoke to the patient. Dawn printed detailed diet/nutrition on his last after-visit-summary. He is comfortable using his MyChart account and will review the information through his portal. He will call us  back if he has further questions.

## 2023-09-18 LAB — FUNGUS CULTURE WITH STAIN

## 2023-09-18 LAB — FUNGUS CULTURE RESULT

## 2023-09-18 LAB — FUNGAL ORGANISM REFLEX

## 2023-10-03 LAB — AFBSM/CULT/AST/MTBRIF,NON-SPUT
Acid Fast Culture: POSITIVE — AB
Acid Fast Smear: NEGATIVE
M tuberculosis complex: NOT DETECTED

## 2023-10-03 LAB — ACID FAST CULTURE WITH REFLEXED SENSITIVITIES (MYCOBACTERIA): Acid Fast Culture: POSITIVE — AB

## 2023-10-03 LAB — PROTEIN, BODY FLUID (OTHER): Protein, Fluid: 1.4 g/dL

## 2023-10-05 LAB — FUNGUS CULTURE W SMEAR

## 2023-10-06 ENCOUNTER — Ambulatory Visit (HOSPITAL_COMMUNITY)
Admission: RE | Admit: 2023-10-06 | Discharge: 2023-10-06 | Disposition: A | Discharge status: Home / Self Care | Source: Ambulatory Visit | Attending: Pulmonary Disease | Admitting: Pulmonary Disease

## 2023-10-06 ENCOUNTER — Other Ambulatory Visit: Payer: Self-pay | Admitting: Gastroenterology

## 2023-10-06 ENCOUNTER — Ambulatory Visit (HOSPITAL_COMMUNITY)
Admission: RE | Admit: 2023-10-06 | Discharge: 2023-10-06 | Disposition: A | Discharge status: Home / Self Care | Source: Ambulatory Visit | Attending: Radiology | Admitting: Radiology

## 2023-10-06 DIAGNOSIS — R918 Other nonspecific abnormal finding of lung field: Secondary | ICD-10-CM | POA: Insufficient documentation

## 2023-10-06 DIAGNOSIS — J90 Pleural effusion, not elsewhere classified: Secondary | ICD-10-CM | POA: Insufficient documentation

## 2023-10-06 MED ORDER — LIDOCAINE-EPINEPHRINE (PF) 2 %-1:200000 IJ SOLN
INTRAMUSCULAR | Status: AC
  Filled 2023-10-06: qty 20

## 2023-10-06 NOTE — Procedures (Signed)
 Ultrasound-guided therapeutic right thoracentesis performed yielding 2.4 liters of turbid, amber/light tan colored fluid. No immediate complications. Follow-up chest x-ray pending. EBL none.

## 2023-10-18 ENCOUNTER — Telehealth: Payer: Self-pay | Admitting: Gastroenterology

## 2023-10-18 NOTE — Telephone Encounter (Signed)
 Patient is followed by Stephane Quest, NP with [facility]. Called and informed patient of this information and provided him with the contact phone number. Patient had no other concerns at the end of the call.

## 2023-10-18 NOTE — Telephone Encounter (Signed)
 Inbound call from patient stating he is having fluid build up on his lung. States he would like for Dr. San to evaluate his liver. Patient is requesting a call back. Please advise. Thank you

## 2023-10-19 ENCOUNTER — Ambulatory Visit (INDEPENDENT_AMBULATORY_CARE_PROVIDER_SITE_OTHER): Admitting: Pulmonary Disease

## 2023-10-19 ENCOUNTER — Encounter (HOSPITAL_BASED_OUTPATIENT_CLINIC_OR_DEPARTMENT_OTHER): Payer: Self-pay | Admitting: Pulmonary Disease

## 2023-10-19 VITALS — BP 102/64 | HR 79 | Ht 67.0 in | Wt 240.0 lb

## 2023-10-19 DIAGNOSIS — R600 Localized edema: Secondary | ICD-10-CM

## 2023-10-19 DIAGNOSIS — D696 Thrombocytopenia, unspecified: Secondary | ICD-10-CM

## 2023-10-19 DIAGNOSIS — K769 Liver disease, unspecified: Secondary | ICD-10-CM | POA: Diagnosis not present

## 2023-10-19 DIAGNOSIS — J918 Pleural effusion in other conditions classified elsewhere: Secondary | ICD-10-CM

## 2023-10-19 DIAGNOSIS — I10 Essential (primary) hypertension: Secondary | ICD-10-CM

## 2023-10-19 NOTE — Progress Notes (Signed)
 Established Patient Pulmonology Office Visit   Subjective:  Patient ID: David Esparza, male    DOB: [birth year]  MRN: [id number]  CC:  Chief Complaint  Patient presents with   Follow-up    HPI  Discussed the use of AI scribe software for clinical note transcription with the patient, who gave verbal consent to proceed.  History of Present Illness Quint Chestnut is a 73 year old male with cirrhosis of the liver who presents with recurrent R pleural effusion. He was referred by his liver doctor for management of pleural effusion.  He has experienced recurrent pleural effusion, necessitating thoracentesis four times in the past month and a half. The procedures were performed twice by me and twice at another facility, with fluid volumes ranging from 1.7 to 3 liters per procedure. The fluid accumulation affects his breathing, particularly during exertion, but improves after drainage.  He has a history of cirrhosis of the liver, diagnosed following an ultrasound that showed increased nodular hepatic echotexture. He also has a low platelet count. Similar symptoms occurred a year ago, resolving after antibiotics and a breather, but have recurred this year.  He is currently taking Lasix  (furosemide ) 40 mg twice daily, totaling 80 mg per day, to manage fluid retention. The medication helps reduce leg swelling, although some fluid accumulation persists. He has experienced a significant decrease in blood pressure.  He has changed his diet, reducing junk food and salt intake, and has lost 10 pounds, attributed to dietary changes and fluid loss. No pain is associated with the fluid accumulation, but he experiences shortness of breath with exertion, which improves with rest.  ROS    Current Outpatient Medications:    albuterol  (VENTOLIN  HFA) 108 (90 Base) MCG/ACT inhaler, Inhale 2 puffs into the lungs every 6 (six) hours as needed for wheezing or shortness of breath., Disp: 18 g, Rfl: 2    ALPRAZolam  (XANAX ) 0.5 MG tablet, Take 1 tablet (0.5 mg total) by mouth at bedtime as needed for anxiety., Disp: 30 tablet, Rfl: 0   atorvastatin  (LIPITOR) 10 MG tablet, Take 1 tablet (10 mg total) by mouth at bedtime., Disp: 90 tablet, Rfl: 1   carvedilol  (COREG ) 12.5 MG tablet, Take 1 tablet (12.5 mg total) by mouth daily. Patient needs follow up appointment for future refills. Please call [phone] to schedule an appointment., Disp: 90 tablet, Rfl: 0   Cholecalciferol (VITAMIN D3) 75 MCG (3000 UT) TABS, Take 1 tablet by mouth daily at 6 (six) AM., Disp: , Rfl:    fluocinolone  0.01 % cream, Apply topically 2 (two) times daily., Disp: 30 g, Rfl: 0   furosemide  (LASIX ) 40 MG tablet, Take 1 tablet (40 mg total) by mouth 2 (two) times daily., Disp: 30 tablet, Rfl: 3   lisinopril  (ZESTRIL ) 2.5 MG tablet, Take 1 tablet (2.5 mg total) by mouth daily., Disp: 90 tablet, Rfl: 1   Vitamin D , Ergocalciferol , (DRISDOL ) 1.25 MG (50000 UNIT) CAPS capsule, Take 1 capsule (50,000 Units total) by mouth every 7 (seven) days., Disp: 12 capsule, Rfl: 0      Objective:  BP 102/64   Pulse 79   Ht 5' 7 (1.702 m)   Wt 240 lb (108.9 kg)   SpO2 93%   BMI 37.59 kg/m  Wt Readings from Last 3 Encounters:  10/19/23 240 lb (108.9 kg)  09/04/23 239 lb (108.4 kg)  08/22/23 248 lb 9.6 oz (112.8 kg)   BMI Readings from Last 3 Encounters:  10/19/23 37.59 kg/m  09/04/23 37.43 kg/m  08/22/23 38.94 kg/m   SpO2 Readings from Last 3 Encounters:  10/19/23 93%  09/04/23 94%  08/22/23 95%   Physical Exam General: NAD, alert, WD, WN Eyes: PERRL, no scleral icterus ENMT: oropharynx clear, good dentition, no oral lesions, mallampati score IV Skin: warm, intact, no rashes Neck: JVD elevated, ROM and lymph node assessment nl CV: RRR, no MRG, nl S1 and S2, 2+ peripheral edema Resp: decreased breath sounds in right lower lung field Abdom: Normoactive bowel sounds, soft, nontender, nondistended, no  hepatosplenomegaly Neuro: Awake alert oriented to person place time and situation  Diagnostic Review:  Last CBC Lab Results  Component Value Date   WBC 5.2 08/03/2023   HGB 14.0 08/03/2023   HCT 40.7 08/03/2023   MCV 100.4 (H) 08/03/2023   MCH 34.7 (H) 01/31/2023   RDW 14.7 08/03/2023   PLT 63.0 (L) 08/03/2023   Last metabolic panel Lab Results  Component Value Date   GLUCOSE 81 09/04/2023   NA 142 09/04/2023   K 3.9 09/04/2023   CL 104 09/04/2023   CO2 25 09/04/2023   BUN 20 09/04/2023   CREATININE 1.06 09/04/2023   EGFR 74 09/04/2023   CALCIUM  9.1 09/04/2023   PHOS 3.5 09/11/2012   PROT 6.4 08/03/2023   ALBUMIN  3.1 (L) 08/03/2023   BILITOT 2.2 (H) 08/03/2023   ALKPHOS 95 08/03/2023   AST 36 08/03/2023   ALT 24 08/03/2023   ANIONGAP 7 01/31/2023   Pleural Fluid Data from 08/15/2023: - pleural fluid cholesterol 27 - pleural fluid LDH 59 - WBC 297, 64% lymphs - CYTOLOGY NEGATIVE - Microbiology including AFB and fungal NEGATIVE  Pleural Fluid Data from 09/04/2023: - pleural fluid cholesterol 23 - pleural fluid LDH 84 - WBC 250, 75% lymphs - CYTOLOGY NEGATIVE - Microbiology including AFB and fungal NEGATIVE    Assessment & Plan:   Assessment & Plan Pleural effusion associated with hepatic disorder  Assessment and Plan Assessment & Plan Recurrent right pleural effusion due to cirrhosis - hepatic hydrothorax Recurrent right pleural effusion likely secondary to cirrhosis. Fluid accumulation significant with multiple recent thoracenteses. Discussed diuretic therapy and TIPS procedure for fluid management.  Indwelling pleural catheter is not advisable for hepatic hydrothorax - Order blood work to assess kidney and liver function, including potassium levels. - Scheduled for thoracentesis with radiologist on November 03, 2023, and plan for monthly drainage until liver specialist consultation. - Send medical records to liver specialist for evaluation of TIPS procedure  candidacy.  Lower extremity edema due to cirrhosis Lower extremity edema likely due to cirrhosis. Diuretics partially effective. Discussed increasing diuretic dosage and adding spironolactone to balance potassium and enhance effect. - Perform blood work to evaluate kidney and liver function. - Consider increasing furosemide  dosage, pending blood work results. - Consider adding spironolactone to diuretic regimen to balance potassium levels, pending blood work results.  Thrombocytopenia due to cirrhosis Thrombocytopenia likely secondary to cirrhosis. Low platelet count consistent with liver dysfunction. - Monitor platelet count in upcoming blood work.  Hypertension Hypertension well-controlled with diuretic therapy. Blood pressure decreased significantly with current regimen. - Monitor blood pressure at home, especially if diuretic dosage is increased.  Orders Placed This Encounter  Procedures   US  THORACENTESIS ASP PLEURAL SPACE W/IMG GUIDE   Comprehensive metabolic panel with GFR   I spent 48 minutes reviewing patient's chart including prior consultant notes, imaging, and PFTs as well as face-to-face with the patient, over half in discussion of the diagnosis and the importance of  compliance with the treatment plan.  Follow-up set up already for November 13, 2023  Lindamarie Maclachlan, MD

## 2023-10-19 NOTE — Patient Instructions (Signed)
  VISIT SUMMARY: You visited us  today to address your recurrent pleural effusion and other symptoms related to your cirrhosis. We discussed your current treatment plan, including medication adjustments and upcoming procedures.  YOUR PLAN: RECURRENT RIGHT PLEURAL EFFUSION DUE TO CIRRHOSIS: You have recurrent fluid buildup in your chest due to your liver condition, which has required multiple drainage procedures. -We will order blood work to check your kidney and liver function, including potassium levels. -You are scheduled for another thoracentesis with the radiologist on November 03, 2023, and we plan to continue monthly drainage until you see the liver specialist. -We will send your medical records to the liver specialist to evaluate if you are a candidate for the TIPS procedure.  LOWER EXTREMITY EDEMA DUE TO CIRRHOSIS: You have swelling in your legs likely due to your liver condition. Your current diuretic medication helps but is not fully effective. -We will perform blood work to evaluate your kidney and liver function. -Depending on your blood work results, we may increase your furosemide  dosage. -We may also add spironolactone to your medication regimen to help balance potassium levels and enhance the effect of your diuretic.  THROMBOCYTOPENIA DUE TO CIRRHOSIS: You have a low platelet count likely due to your liver condition. -We will monitor your platelet count in the upcoming blood work.  HYPERTENSION: Your high blood pressure is well-controlled with your current diuretic therapy. -Please continue to monitor your blood pressure at home, especially if we increase your diuretic dosage.                      Contains text generated by Abridge.                                 Contains text generated by Abridge.

## 2023-10-20 ENCOUNTER — Ambulatory Visit (HOSPITAL_BASED_OUTPATIENT_CLINIC_OR_DEPARTMENT_OTHER): Payer: Self-pay | Admitting: Pulmonary Disease

## 2023-10-20 DIAGNOSIS — J918 Pleural effusion in other conditions classified elsewhere: Secondary | ICD-10-CM

## 2023-10-20 LAB — COMPREHENSIVE METABOLIC PANEL WITH GFR
ALT: 27 IU/L (ref 0–44)
AST: 48 IU/L — ABNORMAL HIGH (ref 0–40)
Albumin: 3.1 g/dL — ABNORMAL LOW (ref 3.8–4.8)
Alkaline Phosphatase: 132 IU/L — ABNORMAL HIGH (ref 47–123)
BUN/Creatinine Ratio: 17 (ref 10–24)
BUN: 19 mg/dL (ref 8–27)
Bilirubin Total: 1.6 mg/dL — ABNORMAL HIGH (ref 0.0–1.2)
CO2: 24 mmol/L (ref 20–29)
Calcium: 8.7 mg/dL (ref 8.6–10.2)
Chloride: 103 mmol/L (ref 96–106)
Creatinine, Ser: 1.09 mg/dL (ref 0.76–1.27)
Globulin, Total: 3.1 g/dL (ref 1.5–4.5)
Glucose: 153 mg/dL — ABNORMAL HIGH (ref 70–99)
Potassium: 4.7 mmol/L (ref 3.5–5.2)
Sodium: 140 mmol/L (ref 134–144)
Total Protein: 6.2 g/dL (ref 6.0–8.5)
eGFR: 71 mL/min/1.73 (ref >59)

## 2023-10-20 NOTE — Addendum Note (Signed)
 Addended by: Larosa Rhines on: 10/20/2023 01:04 PM   Modules accepted: Orders

## 2023-10-23 ENCOUNTER — Ambulatory Visit (HOSPITAL_COMMUNITY): Admission: RE | Admit: 2023-10-23 | Source: Home / Self Care | Admitting: Pulmonary Disease

## 2023-10-23 ENCOUNTER — Encounter (HOSPITAL_COMMUNITY): Admission: RE | Payer: Self-pay | Source: Home / Self Care

## 2023-10-23 SURGERY — THORACENTESIS

## 2023-10-24 ENCOUNTER — Other Ambulatory Visit (HOSPITAL_COMMUNITY)

## 2023-10-25 LAB — ACID FAST CULTURE WITH REFLEXED SENSITIVITIES (MYCOBACTERIA): Acid Fast Culture: NEGATIVE

## 2023-11-01 ENCOUNTER — Ambulatory Visit (HOSPITAL_BASED_OUTPATIENT_CLINIC_OR_DEPARTMENT_OTHER): Payer: Self-pay | Admitting: Pulmonary Disease

## 2023-11-01 NOTE — Telephone Encounter (Signed)
 FYI Only or Action Required?: FYI only for provider.  Patient is followed in Pulmonology for pleural effusions, last seen on 10/19/2023 by Catherine Cools, MD.  Called Nurse Triage reporting Shortness of Breath.  Symptoms began several days ago.  Interventions attempted: Prescription medications: lasix .  Symptoms are: gradually worsening.  Triage Disposition: See HCP Within 4 Hours (Or PCP Triage)  Patient/caregiver understands and will follow disposition?: No  Rn does not see any appts. Called [facility] and they also didn't seen anything available sooner than what he had. Upon checking appt list for patient, RN realized pt has thoracentesis scheduled for Friday. Pt was unaware of this. RN gave the information and pt stated he can wait until Friday. RN gave instructions on when to go to the ER. Pt stated understanding.     Copied from CRM [id number]. Topic: Clinical - Red Word Triage >> Nov 01, 2023 12:02 PM Leila BROCKS wrote: Red Word that prompted transfer to Nurse Triage: Patient [phone] states has water in lungs again started 4-5 days ago and wants to be see Dr. Catherine to have it drain, uncomfortable when breathing- shortness of breath, wheezing. Patient denies fever. Please advise. Reason for Disposition  [1] Longstanding difficulty breathing (e.g., CHF, COPD, emphysema) AND [2] WORSE than normal  Answer Assessment - Initial Assessment Questions Pt states that he has been having worsening shortness of breath. He states that he has been having procedures to remove the fluid at least 4 times. The last one was 2 weeks ago. He states he is taking his water pills and it's helping a little but it still comes back.   1. RESPIRATORY STATUS: Describe your breathing? (e.g., wheezing, shortness of breath, unable to speak, severe coughing)      Walk across parking lot loses breath, needs to rest  2. ONSET: When did this breathing problem begin?      4-5 days ago 3. PATTERN Does the  difficult breathing come and go, or has it been constant since it started?      intermittent 4. SEVERITY: How bad is your breathing? (e.g., mild, moderate, severe)      moderate 5. RECURRENT SYMPTOM: Have you had difficulty breathing before? If Yes, ask: When was the last time? and What happened that time?      yes  7. LUNG HISTORY: Do you have any history of lung disease?  (e.g., pulmonary embolus, asthma, emphysema)     Pleural effusions 8. CAUSE: What do you think is causing the breathing problem?      Fluid build up 9. OTHER SYMPTOMS: Do you have any other symptoms? (e.g., chest pain, cough, dizziness, fever, runny nose)     no  Protocols used: Breathing Difficulty-A-AH

## 2023-11-01 NOTE — Telephone Encounter (Signed)
 Pt has thora on Friday

## 2023-11-03 ENCOUNTER — Ambulatory Visit (HOSPITAL_COMMUNITY)
Admission: RE | Admit: 2023-11-03 | Discharge: 2023-11-03 | Disposition: A | Discharge status: Home / Self Care | Source: Ambulatory Visit | Attending: Pulmonary Disease | Admitting: Pulmonary Disease

## 2023-11-03 ENCOUNTER — Ambulatory Visit (HOSPITAL_COMMUNITY)
Admission: RE | Admit: 2023-11-03 | Discharge: 2023-11-03 | Disposition: A | Discharge status: Home / Self Care | Source: Ambulatory Visit | Attending: Radiology | Admitting: Radiology

## 2023-11-03 DIAGNOSIS — J9811 Atelectasis: Secondary | ICD-10-CM | POA: Diagnosis not present

## 2023-11-03 DIAGNOSIS — J90 Pleural effusion, not elsewhere classified: Secondary | ICD-10-CM | POA: Insufficient documentation

## 2023-11-03 DIAGNOSIS — R9389 Abnormal findings on diagnostic imaging of other specified body structures: Secondary | ICD-10-CM | POA: Diagnosis not present

## 2023-11-03 MED ORDER — LIDOCAINE-EPINEPHRINE (PF) 2 %-1:200000 IJ SOLN
INTRAMUSCULAR | Status: AC
  Filled 2023-11-03: qty 20

## 2023-11-03 NOTE — Procedures (Signed)
 Ultrasound-guided  therapeutic right thoracentesis performed yielding 3 liters of amber/light tan colored fluid. No immediate complications. Follow-up chest x-ray revealed no pneumothorax. EBL none.  Due to the need for multiple right thoracenteses in this patient with hepatic hydrothorax he will be referred to the [facility] Interventional Radiology portal hypertension clinic for formal evaluation.

## 2023-11-06 ENCOUNTER — Other Ambulatory Visit: Payer: Self-pay | Admitting: Family

## 2023-11-10 ENCOUNTER — Ambulatory Visit: Admitting: Family Medicine

## 2023-11-11 NOTE — Progress Notes (Signed)
 "  Established Patient Pulmonology Office Visit   Subjective:  Patient ID: David Esparza, male    DOB: 1950-12-11  MRN: [id number]  CC:  Chief Complaint  Patient presents with   Pleural Effusion    HPI  [name] is a 73 y.o. male with HTN, HLD, Morbid Obesity, NASH cirrhosis, and Bell's Palsy who presents for evaluation and management of recurrent hepatic hydrothorax.  Recurrent pleural effusion. 2 thoracentesis in our office and 3 with IR. Each time draining between 1.5 to 3 L. Referred to portal hypertension IR clinic for consideration of TIPS procedure. I asked him to increase lasix  40 mg to three tablets daily.  Discussed the use of AI scribe software for clinical note transcription with the patient, who gave verbal consent to proceed.  History of Present Illness David Esparza is a 73 year old male with cirrhosis of the liver who presents for evaluation of recurrent fluid accumulation and consideration for TIPS procedure.  He experiences recurrent fluid accumulation requiring drainage approximately every week to week and a half. After drainage, he feels better, but within a day, he starts to feel a buildup again, leading to shortness of breath, especially when walking or talking on the phone. He has been drained five times previously, with the last drainage occurring a week ago Friday, where three liters were removed.  He is currently taking Lasix , 40 mg tablets, two in the morning and sometimes a third in the afternoon, but not consistently. Taking the third pill at night causes him to wake up frequently.  He has a history of cirrhosis of the liver, confirmed by an ultrasound in June. He mentions having a 'little bit of fat' on his liver and has not seen his liver doctor for several months.  He has not experienced any major health issues in his 73 years, aside from gout, which he has managed.  He lives in [place] and has been proactive in managing his health,  including monitoring his oxygen levels, which he reports as good. He is not on blood thinners and has noticed that his right leg is more swollen than the left, which is unusual for him.  ROS    Current Outpatient Medications:    albuterol  (VENTOLIN  HFA) 108 (90 Base) MCG/ACT inhaler, Inhale 2 puffs into the lungs every 6 (six) hours as needed for wheezing or shortness of breath., Disp: 18 g, Rfl: 2   carvedilol  (COREG ) 12.5 MG tablet, Take 1 tablet (12.5 mg total) by mouth daily. Patient needs follow up appointment for future refills. Please call [phone] to schedule an appointment., Disp: 90 tablet, Rfl: 0   lisinopril  (ZESTRIL ) 2.5 MG tablet, TAKE 1 TABLET (2.5 MG TOTAL) BY MOUTH DAILY., Disp: 90 tablet, Rfl: 1   furosemide  (LASIX ) 40 MG tablet, Take 2 tablets in the morning and 1 tablet in the afternoon., Disp: 90 tablet, Rfl: 3      Objective:  BP 137/87   Pulse 93   Ht 5' 7 (1.702 m)   Wt 240 lb 1.6 oz (108.9 kg)   SpO2 94%   BMI 37.60 kg/m  Wt Readings from Last 3 Encounters:  11/13/23 240 lb 1.6 oz (108.9 kg)  10/19/23 240 lb (108.9 kg)  09/04/23 239 lb (108.4 kg)   BMI Readings from Last 3 Encounters:  11/13/23 37.60 kg/m  10/19/23 37.59 kg/m  09/04/23 37.43 kg/m   SpO2 Readings from Last 3 Encounters:  11/13/23 94%  10/19/23 93%  09/04/23 94%  Physical Exam General: NAD, alert, WD, WN Eyes: PERRL, no scleral icterus ENMT: oropharynx clear, good dentition, no oral lesions, mallampati score IV Skin: warm, intact, no rashes Neck: JVD elevated, ROM and lymph node assessment nl CV: RRR, no MRG, nl S1 and S2, 2+ peripheral edema worse on R than L. Resp: decreased breath sounds in right lower lung field Abdom: Normoactive bowel sounds, soft, nontender, nondistended, no hepatosplenomegaly Neuro: Awake alert oriented to person place time and situation  Diagnostic Review:  Last CBC Lab Results  Component Value Date   WBC 5.2 08/03/2023   HGB 14.0  08/03/2023   HCT 40.7 08/03/2023   MCV 100.4 (H) 08/03/2023   MCH 34.7 (H) 01/31/2023   RDW 14.7 08/03/2023   PLT 63.0 (L) 08/03/2023   Last metabolic panel Lab Results  Component Value Date   GLUCOSE 153 (H) 10/19/2023   NA 140 10/19/2023   K 4.7 10/19/2023   CL 103 10/19/2023   CO2 24 10/19/2023   BUN 19 10/19/2023   CREATININE 1.09 10/19/2023   EGFR 71 10/19/2023   CALCIUM  8.7 10/19/2023   PHOS 3.5 09/11/2012   PROT 6.2 10/19/2023   ALBUMIN  3.1 (L) 10/19/2023   LABGLOB 3.1 10/19/2023   BILITOT 1.6 (H) 10/19/2023   ALKPHOS 132 (H) 10/19/2023   AST 48 (H) 10/19/2023   ALT 27 10/19/2023   ANIONGAP 7 01/31/2023   Pleural Fluid Data from 08/15/2023: - pleural fluid cholesterol 27 - pleural fluid LDH 59 - WBC 297, 64% lymphs - CYTOLOGY NEGATIVE - Microbiology including AFB and fungal NEGATIVE  Pleural Fluid Data from 09/04/2023: - pleural fluid cholesterol 23 - pleural fluid LDH 84 - WBC 250, 75% lymphs - CYTOLOGY NEGATIVE - Microbiology including AFB and fungal NEGATIVE    Assessment & Plan:   Assessment & Plan Pleural effusion associated with hepatic disorder Cirrhosis of liver with recurrent hepatic hydrothorax and lower extremity edema Cirrhosis causing recurrent hepatic hydrothorax and edema. TIPS considered for fluid management. Discussed TIPS risks, including hepatic encephalopathy, deemed low due to absence of HE. - Recommend TIPS procedure to reduce portal hypertension - Adjust furosemide  to two tablets in the morning and one in the afternoon. - Schedule blood work next week to monitor kidney function. - Continue with scheduled thoracentesis appointments as needed. Localized swelling of right lower extremity Likely due to cirrhosis with portal HTN. There's differential swelling in RLE > LLE. Will order duplex US  to rule out DVT. Not on anticoagulation. - Order ultrasound of the right leg to rule out deep vein thrombosis.  Orders Placed This Encounter   Procedures   IR Tips   Comprehensive metabolic panel with GFR   CBC with Differential/Platelet   Ambulatory referral to Interventional Radiology   VAS US  LOWER EXTREMITY VENOUS (DVT)   Return in about 6 weeks (around 12/25/2023).  I spent 30 minutes reviewing patient's chart including prior consultant notes, imaging, and PFTs as well as face-to-face with the patient, over half in discussion of the diagnosis and the importance of compliance with the treatment plan.  Shalissa Easterwood, MD "

## 2023-11-13 ENCOUNTER — Ambulatory Visit (INDEPENDENT_AMBULATORY_CARE_PROVIDER_SITE_OTHER): Admitting: Pulmonary Disease

## 2023-11-13 ENCOUNTER — Encounter (HOSPITAL_BASED_OUTPATIENT_CLINIC_OR_DEPARTMENT_OTHER): Payer: Self-pay | Admitting: Pulmonary Disease

## 2023-11-13 VITALS — BP 137/87 | HR 93 | Ht 67.0 in | Wt 240.1 lb

## 2023-11-13 DIAGNOSIS — J918 Pleural effusion in other conditions classified elsewhere: Secondary | ICD-10-CM | POA: Diagnosis not present

## 2023-11-13 DIAGNOSIS — R2241 Localized swelling, mass and lump, right lower limb: Secondary | ICD-10-CM | POA: Diagnosis not present

## 2023-11-13 DIAGNOSIS — K769 Liver disease, unspecified: Secondary | ICD-10-CM

## 2023-11-13 MED ORDER — FUROSEMIDE 40 MG PO TABS: ORAL_TABLET | ORAL | 3 refills | Status: DC

## 2023-11-13 NOTE — Patient Instructions (Signed)
  VISIT SUMMARY: You visited us  today to discuss your recurrent fluid accumulation due to cirrhosis and to consider a TIPS procedure. We also addressed your leg swelling and hypertension management.  YOUR PLAN: CIRRHOSIS OF LIVER WITH RECURRENT HEPATIC HYDROTHORAX AND LOWER EXTREMITY EDEMA: Your cirrhosis is causing fluid buildup in your chest and swelling in your legs. -We recommend proceeding with the TIPS procedure to help manage the fluid accumulation. -Adjust your furosemide  (Lasix ) dosage to two tablets in the morning and one in the afternoon. -Schedule blood work next week to monitor your kidney function. -Continue with your scheduled thoracentesis appointments as needed.  SUSPECTED RIGHT LOWER EXTREMITY DEEP VEIN THROMBOSIS: You have swelling in your right leg, which could be due to a blood clot. -We will order an ultrasound of your right leg to check for a blood clot.  HYPERTENSION: Your blood pressure is well-managed with your current medications. -Continue taking your current blood pressure medications, carvedilol  and lisinopril , as prescribed.   Contains text generated by Abridge.

## 2023-11-14 ENCOUNTER — Ambulatory Visit (HOSPITAL_COMMUNITY)
Admission: RE | Admit: 2023-11-14 | Discharge: 2023-11-14 | Disposition: A | Discharge status: Home / Self Care | Source: Ambulatory Visit | Attending: Radiology | Admitting: Radiology

## 2023-11-14 ENCOUNTER — Other Ambulatory Visit (HOSPITAL_COMMUNITY)

## 2023-11-14 ENCOUNTER — Other Ambulatory Visit (HOSPITAL_BASED_OUTPATIENT_CLINIC_OR_DEPARTMENT_OTHER): Payer: Self-pay | Admitting: Pulmonary Disease

## 2023-11-14 ENCOUNTER — Ambulatory Visit: Admitting: Student

## 2023-11-14 ENCOUNTER — Ambulatory Visit (HOSPITAL_COMMUNITY)
Admission: RE | Admit: 2023-11-14 | Discharge: 2023-11-14 | Disposition: A | Discharge status: Home / Self Care | Source: Ambulatory Visit | Attending: Pulmonary Disease | Admitting: Pulmonary Disease

## 2023-11-14 DIAGNOSIS — K769 Liver disease, unspecified: Secondary | ICD-10-CM | POA: Diagnosis not present

## 2023-11-14 DIAGNOSIS — J918 Pleural effusion in other conditions classified elsewhere: Secondary | ICD-10-CM

## 2023-11-14 DIAGNOSIS — J90 Pleural effusion, not elsewhere classified: Secondary | ICD-10-CM | POA: Diagnosis not present

## 2023-11-14 MED ORDER — LIDOCAINE-EPINEPHRINE (PF) 2 %-1:200000 IJ SOLN
INTRAMUSCULAR | Status: AC
  Filled 2023-11-14: qty 20

## 2023-11-14 NOTE — Procedures (Signed)
 Ultrasound-guided  therapeutic right thoracentesis performed yielding 3 liters of turbid, amber/light tan colored fluid. No immediate complications. Follow-up chest x-ray pending.EBL none. The pt is scheduled for TIPS consult at [facility] facility on 11/15/23.
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
## 2023-11-15 ENCOUNTER — Ambulatory Visit: Admitting: Student

## 2023-11-15 ENCOUNTER — Inpatient Hospital Stay
Admission: RE | Admit: 2023-11-15 | Discharge: 2023-11-15 | Disposition: A | Source: Ambulatory Visit | Attending: Pulmonary Disease | Admitting: Pulmonary Disease

## 2023-11-15 ENCOUNTER — Other Ambulatory Visit: Payer: Self-pay | Admitting: Interventional Radiology

## 2023-11-15 DIAGNOSIS — K766 Portal hypertension: Secondary | ICD-10-CM | POA: Diagnosis not present

## 2023-11-15 DIAGNOSIS — J918 Pleural effusion in other conditions classified elsewhere: Secondary | ICD-10-CM

## 2023-11-15 DIAGNOSIS — I8500 Esophageal varices without bleeding: Secondary | ICD-10-CM | POA: Diagnosis not present

## 2023-11-15 HISTORY — PX: IR RADIOLOGIST EVAL & MGMT: IMG5224

## 2023-11-16 ENCOUNTER — Ambulatory Visit (HOSPITAL_COMMUNITY)
Admission: RE | Admit: 2023-11-16 | Discharge: 2023-11-16 | Disposition: A | Discharge status: Home / Self Care | Source: Ambulatory Visit | Attending: Pulmonary Disease | Admitting: Pulmonary Disease

## 2023-11-16 ENCOUNTER — Ambulatory Visit: Payer: Self-pay | Admitting: Pulmonary Disease

## 2023-11-16 DIAGNOSIS — R2241 Localized swelling, mass and lump, right lower limb: Secondary | ICD-10-CM | POA: Diagnosis not present

## 2023-11-17 ENCOUNTER — Ambulatory Visit
Admission: RE | Admit: 2023-11-17 | Discharge: 2023-11-17 | Disposition: A | Discharge status: Home / Self Care | Source: Ambulatory Visit | Attending: Interventional Radiology | Admitting: Interventional Radiology

## 2023-11-17 DIAGNOSIS — K769 Liver disease, unspecified: Secondary | ICD-10-CM

## 2023-11-17 DIAGNOSIS — K7460 Unspecified cirrhosis of liver: Secondary | ICD-10-CM | POA: Diagnosis not present

## 2023-11-17 DIAGNOSIS — N281 Cyst of kidney, acquired: Secondary | ICD-10-CM | POA: Diagnosis not present

## 2023-11-17 MED ORDER — IOPAMIDOL (ISOVUE-370) INJECTION 76%
80.0000 mL | Freq: Once | INTRAVENOUS | Status: AC | PRN
  Administered 2023-11-17: 80 mL via INTRAVENOUS

## 2023-11-20 ENCOUNTER — Telehealth (HOSPITAL_COMMUNITY): Payer: Self-pay | Admitting: Radiology

## 2023-11-20 NOTE — Telephone Encounter (Signed)
 Called pt to schedule TIPS with Dr. Jennefer. Left VM JM

## 2023-12-04 ENCOUNTER — Other Ambulatory Visit (HOSPITAL_COMMUNITY): Payer: Self-pay | Admitting: Radiology

## 2023-12-04 ENCOUNTER — Ambulatory Visit (HOSPITAL_COMMUNITY)
Admission: RE | Admit: 2023-12-04 | Discharge: 2023-12-04 | Disposition: A | Discharge status: Home / Self Care | Source: Ambulatory Visit | Attending: Pulmonary Disease | Admitting: Pulmonary Disease

## 2023-12-04 ENCOUNTER — Ambulatory Visit (HOSPITAL_COMMUNITY)
Admission: RE | Admit: 2023-12-04 | Discharge: 2023-12-04 | Disposition: A | Discharge status: Home / Self Care | Source: Ambulatory Visit | Attending: Radiology | Admitting: Radiology

## 2023-12-04 DIAGNOSIS — K7581 Nonalcoholic steatohepatitis (NASH): Secondary | ICD-10-CM | POA: Insufficient documentation

## 2023-12-04 DIAGNOSIS — R0989 Other specified symptoms and signs involving the circulatory and respiratory systems: Secondary | ICD-10-CM | POA: Diagnosis not present

## 2023-12-04 DIAGNOSIS — J9811 Atelectasis: Secondary | ICD-10-CM | POA: Diagnosis not present

## 2023-12-04 DIAGNOSIS — K7460 Unspecified cirrhosis of liver: Secondary | ICD-10-CM | POA: Diagnosis not present

## 2023-12-04 DIAGNOSIS — K766 Portal hypertension: Secondary | ICD-10-CM | POA: Diagnosis not present

## 2023-12-04 DIAGNOSIS — J90 Pleural effusion, not elsewhere classified: Secondary | ICD-10-CM

## 2023-12-04 DIAGNOSIS — Z48813 Encounter for surgical aftercare following surgery on the respiratory system: Secondary | ICD-10-CM | POA: Diagnosis not present

## 2023-12-04 DIAGNOSIS — K769 Liver disease, unspecified: Secondary | ICD-10-CM

## 2023-12-04 HISTORY — PX: IR THORACENTESIS ASP PLEURAL SPACE W/IMG GUIDE: IMG5380

## 2023-12-04 MED ORDER — LIDOCAINE HCL 1 % IJ SOLN
20.0000 mL | Freq: Once | INTRAMUSCULAR | Status: AC
  Administered 2023-12-04: 10 mL

## 2023-12-04 NOTE — Procedures (Signed)
 PROCEDURE SUMMARY:  Successful image-guided right thoracentesis. Yielded 3.3 liters of hazy orange fluid. Patient tolerated procedure well. EBL: trace No immediate complications.  Specimen not sent for labs. Post procedure CXR shows no pneumothorax.  Please see imaging section of Epic for full dictation.  Kimble DEL Donyetta Ogletree PA-C 12/04/2023 1:26 PM

## 2023-12-12 ENCOUNTER — Encounter (HOSPITAL_COMMUNITY): Payer: Self-pay | Admitting: Interventional Radiology

## 2023-12-12 ENCOUNTER — Other Ambulatory Visit: Payer: Self-pay

## 2023-12-12 NOTE — Progress Notes (Addendum)
 SDW CALL  Patient was given pre-op instructions over the phone. The opportunity was given for the patient to ask questions. No further questions asked. Patient verbalized understanding of instructions given.   PCP - Harlene Domenica COME Cardiologist - denies  PPM/ICD - denies Device Orders -  Rep Notified -   Chest x-ray - 1 view-12/04/23 EKG - 08/03/23 Stress Test -  ECHO - 09/04/17 Cardiac Cath - denies  Sleep Study - 2009- pt stated he no longer has sleep apnea. CPAP - no  Fasting Blood Sugar - pt denies having diabetes Checks Blood Sugar _____ times a day  Blood Thinner Instructions:na Aspirin  Instructions:na  ERAS Protcol -NPO PRE-SURGERY Ensure or G2-   COVID TEST- na   Anesthesia review: yes- hx HTN,cirrhosis with portal hypertension, esophageal varices and recurrent hepatic hydrothorax.   Patient denies shortness of breath, fever, cough and chest pain over the phone call   Special instructions:    Oral Hygiene is also important to reduce your risk of infection.  Remember - BRUSH YOUR TEETH THE MORNING OF SURGERY WITH YOUR REGULAR TOOTHPASTE

## 2023-12-13 ENCOUNTER — Other Ambulatory Visit: Payer: Self-pay | Admitting: Student

## 2023-12-13 DIAGNOSIS — Z01818 Encounter for other preprocedural examination: Secondary | ICD-10-CM

## 2023-12-14 ENCOUNTER — Inpatient Hospital Stay (HOSPITAL_COMMUNITY): Payer: Self-pay | Admitting: Certified Registered"

## 2023-12-14 ENCOUNTER — Other Ambulatory Visit: Payer: Self-pay

## 2023-12-14 ENCOUNTER — Encounter (HOSPITAL_COMMUNITY): Payer: Self-pay | Admitting: Interventional Radiology

## 2023-12-14 ENCOUNTER — Inpatient Hospital Stay (HOSPITAL_COMMUNITY): Admission: RE | Admit: 2023-12-14 | Discharge: 2023-12-16 | DRG: 406 | Disposition: A

## 2023-12-14 ENCOUNTER — Ambulatory Visit (HOSPITAL_COMMUNITY)

## 2023-12-14 ENCOUNTER — Encounter (HOSPITAL_COMMUNITY): Admission: RE | Disposition: A | Payer: Self-pay | Source: Home / Self Care

## 2023-12-14 ENCOUNTER — Inpatient Hospital Stay (HOSPITAL_COMMUNITY)

## 2023-12-14 ENCOUNTER — Inpatient Hospital Stay (HOSPITAL_COMMUNITY)
Admission: RE | Admit: 2023-12-14 | Discharge: 2023-12-14 | Disposition: A | Source: Ambulatory Visit | Attending: Pulmonary Disease | Admitting: Pulmonary Disease

## 2023-12-14 DIAGNOSIS — Z6836 Body mass index (BMI) 36.0-36.9, adult: Secondary | ICD-10-CM | POA: Diagnosis not present

## 2023-12-14 DIAGNOSIS — I959 Hypotension, unspecified: Secondary | ICD-10-CM | POA: Diagnosis present

## 2023-12-14 DIAGNOSIS — M109 Gout, unspecified: Secondary | ICD-10-CM | POA: Diagnosis present

## 2023-12-14 DIAGNOSIS — I8510 Secondary esophageal varices without bleeding: Secondary | ICD-10-CM | POA: Diagnosis present

## 2023-12-14 DIAGNOSIS — K7460 Unspecified cirrhosis of liver: Secondary | ICD-10-CM | POA: Diagnosis present

## 2023-12-14 DIAGNOSIS — E785 Hyperlipidemia, unspecified: Secondary | ICD-10-CM | POA: Diagnosis present

## 2023-12-14 DIAGNOSIS — E66812 Obesity, class 2: Secondary | ICD-10-CM | POA: Diagnosis present

## 2023-12-14 DIAGNOSIS — G4700 Insomnia, unspecified: Secondary | ICD-10-CM | POA: Diagnosis present

## 2023-12-14 DIAGNOSIS — J9690 Respiratory failure, unspecified, unspecified whether with hypoxia or hypercapnia: Secondary | ICD-10-CM | POA: Diagnosis not present

## 2023-12-14 DIAGNOSIS — K769 Liver disease, unspecified: Secondary | ICD-10-CM | POA: Diagnosis not present

## 2023-12-14 DIAGNOSIS — Z833 Family history of diabetes mellitus: Secondary | ICD-10-CM | POA: Diagnosis not present

## 2023-12-14 DIAGNOSIS — I9581 Postprocedural hypotension: Secondary | ICD-10-CM | POA: Diagnosis not present

## 2023-12-14 DIAGNOSIS — Z95828 Presence of other vascular implants and grafts: Secondary | ICD-10-CM | POA: Diagnosis not present

## 2023-12-14 DIAGNOSIS — Z79899 Other long term (current) drug therapy: Secondary | ICD-10-CM | POA: Diagnosis not present

## 2023-12-14 DIAGNOSIS — J918 Pleural effusion in other conditions classified elsewhere: Principal | ICD-10-CM

## 2023-12-14 DIAGNOSIS — J90 Pleural effusion, not elsewhere classified: Secondary | ICD-10-CM | POA: Diagnosis not present

## 2023-12-14 DIAGNOSIS — I864 Gastric varices: Secondary | ICD-10-CM | POA: Diagnosis present

## 2023-12-14 DIAGNOSIS — K766 Portal hypertension: Secondary | ICD-10-CM | POA: Diagnosis present

## 2023-12-14 DIAGNOSIS — I10 Essential (primary) hypertension: Secondary | ICD-10-CM | POA: Diagnosis present

## 2023-12-14 DIAGNOSIS — K760 Fatty (change of) liver, not elsewhere classified: Secondary | ICD-10-CM | POA: Diagnosis not present

## 2023-12-14 DIAGNOSIS — K7581 Nonalcoholic steatohepatitis (NASH): Secondary | ICD-10-CM | POA: Diagnosis present

## 2023-12-14 DIAGNOSIS — Z01818 Encounter for other preprocedural examination: Secondary | ICD-10-CM

## 2023-12-14 DIAGNOSIS — J948 Other specified pleural conditions: Secondary | ICD-10-CM | POA: Diagnosis present

## 2023-12-14 DIAGNOSIS — K7681 Hepatopulmonary syndrome: Secondary | ICD-10-CM | POA: Diagnosis not present

## 2023-12-14 DIAGNOSIS — J9811 Atelectasis: Secondary | ICD-10-CM | POA: Diagnosis not present

## 2023-12-14 DIAGNOSIS — G4730 Sleep apnea, unspecified: Secondary | ICD-10-CM | POA: Diagnosis not present

## 2023-12-14 DIAGNOSIS — R1906 Epigastric swelling, mass or lump: Secondary | ICD-10-CM | POA: Diagnosis not present

## 2023-12-14 DIAGNOSIS — Z8249 Family history of ischemic heart disease and other diseases of the circulatory system: Secondary | ICD-10-CM | POA: Diagnosis not present

## 2023-12-14 DIAGNOSIS — I8501 Esophageal varices with bleeding: Secondary | ICD-10-CM | POA: Diagnosis not present

## 2023-12-14 DIAGNOSIS — E119 Type 2 diabetes mellitus without complications: Secondary | ICD-10-CM | POA: Diagnosis present

## 2023-12-14 DIAGNOSIS — F419 Anxiety disorder, unspecified: Secondary | ICD-10-CM | POA: Diagnosis present

## 2023-12-14 DIAGNOSIS — R110 Nausea: Secondary | ICD-10-CM | POA: Diagnosis not present

## 2023-12-14 DIAGNOSIS — Z87891 Personal history of nicotine dependence: Secondary | ICD-10-CM | POA: Diagnosis not present

## 2023-12-14 HISTORY — PX: TIPS PROCEDURE: SHX808

## 2023-12-14 HISTORY — PX: IR TIPS: IMG2295

## 2023-12-14 HISTORY — PX: IR THORACENTESIS ASP PLEURAL SPACE W/IMG GUIDE: IMG5380

## 2023-12-14 HISTORY — PX: IR US GUIDE VASC ACCESS RIGHT: IMG2390

## 2023-12-14 HISTORY — PX: IR ANGIOGRAM SELECTIVE EACH ADDITIONAL VESSEL: IMG667

## 2023-12-14 HISTORY — PX: IR EMBO ARTERIAL NOT HEMORR HEMANG INC GUIDE ROADMAPPING: IMG5448

## 2023-12-14 LAB — COMPREHENSIVE METABOLIC PANEL WITH GFR
ALT: 28 U/L (ref 0–44)
AST: 47 U/L — ABNORMAL HIGH (ref 15–41)
Albumin: 2.4 g/dL — ABNORMAL LOW (ref 3.5–5.0)
Alkaline Phosphatase: 87 U/L (ref 38–126)
Anion gap: 8 (ref 5–15)
BUN: 17 mg/dL (ref 8–23)
CO2: 27 mmol/L (ref 22–32)
Calcium: 8.1 mg/dL — ABNORMAL LOW (ref 8.9–10.3)
Chloride: 105 mmol/L (ref 98–111)
Creatinine, Ser: 1.02 mg/dL (ref 0.61–1.24)
GFR, Estimated: >60 mL/min (ref >60)
Glucose, Bld: 107 mg/dL — ABNORMAL HIGH (ref 70–99)
Potassium: 3.6 mmol/L (ref 3.5–5.1)
Sodium: 140 mmol/L (ref 135–145)
Total Bilirubin: 3.1 mg/dL — ABNORMAL HIGH (ref 0.0–1.2)
Total Protein: 6.2 g/dL — ABNORMAL LOW (ref 6.5–8.1)

## 2023-12-14 LAB — CBC
HCT: 37.8 % — ABNORMAL LOW (ref 39.0–52.0)
Hemoglobin: 13.3 g/dL (ref 13.0–17.0)
MCH: 35.3 pg — ABNORMAL HIGH (ref 26.0–34.0)
MCHC: 35.2 g/dL (ref 30.0–36.0)
MCV: 100.3 fL — ABNORMAL HIGH (ref 80.0–100.0)
Platelets: 61 K/uL — ABNORMAL LOW (ref 150–400)
RBC: 3.77 MIL/uL — ABNORMAL LOW (ref 4.22–5.81)
RDW: 14.3 % (ref 11.5–15.5)
WBC: 5.3 K/uL (ref 4.0–10.5)
nRBC: 0 % (ref 0.0–0.2)

## 2023-12-14 LAB — POCT I-STAT 7, (LYTES, BLD GAS, ICA,H+H)
Acid-Base Excess: 2 mmol/L (ref 0.0–2.0)
Bicarbonate: 26.2 mmol/L (ref 20.0–28.0)
Calcium, Ion: 1.06 mmol/L — ABNORMAL LOW (ref 1.15–1.40)
HCT: 33 % — ABNORMAL LOW (ref 39.0–52.0)
Hemoglobin: 11.2 g/dL — ABNORMAL LOW (ref 13.0–17.0)
O2 Saturation: 97 %
Patient temperature: 36.2
Potassium: 3.7 mmol/L (ref 3.5–5.1)
Sodium: 140 mmol/L (ref 135–145)
TCO2: 27 mmol/L (ref 22–32)
pCO2 arterial: 38.2 mmHg (ref 32–48)
pH, Arterial: 7.44 (ref 7.35–7.45)
pO2, Arterial: 85 mmHg (ref 83–108)

## 2023-12-14 LAB — LACTIC ACID, PLASMA: Lactic Acid, Venous: 1.7 mmol/L (ref 0.5–1.9)

## 2023-12-14 LAB — MRSA NEXT GEN BY PCR, NASAL: MRSA by PCR Next Gen: NOT DETECTED

## 2023-12-14 LAB — GLUCOSE, CAPILLARY
Glucose-Capillary: 102 mg/dL — ABNORMAL HIGH (ref 70–99)
Glucose-Capillary: 107 mg/dL — ABNORMAL HIGH (ref 70–99)
Glucose-Capillary: 105 mg/dL — ABNORMAL HIGH (ref 70–99)
Glucose-Capillary: 111 mg/dL — ABNORMAL HIGH (ref 70–99)
Glucose-Capillary: 195 mg/dL — ABNORMAL HIGH (ref 70–99)

## 2023-12-14 LAB — PROTIME-INR
INR: 1.3 — ABNORMAL HIGH (ref 0.8–1.2)
Prothrombin Time: 17.1 s — ABNORMAL HIGH (ref 11.4–15.2)

## 2023-12-14 LAB — HEMOGLOBIN A1C
Hgb A1c MFr Bld: 4.9 % (ref 4.8–5.6)
Mean Plasma Glucose: 93.93 mg/dL

## 2023-12-14 LAB — PREPARE RBC (CROSSMATCH)

## 2023-12-14 SURGERY — INSERTION, SHUNT, INTRAHEPATIC PORTOSYSTEMIC, TRANSJUGULAR
Anesthesia: General

## 2023-12-14 MED ORDER — ALBUMIN HUMAN 5 % IV SOLN
12.5000 g | Freq: Once | INTRAVENOUS | Status: AC
  Administered 2023-12-14: 12.5 g via INTRAVENOUS

## 2023-12-14 MED ORDER — LACTATED RINGERS IV SOLN: INTRAVENOUS | Status: DC

## 2023-12-14 MED ORDER — NOREPINEPHRINE 4 MG/250ML-% IV SOLN: 2.0000 ug/min | INTRAVENOUS | Status: DC

## 2023-12-14 MED ORDER — POLYETHYLENE GLYCOL 3350 17 G PO PACK: 17.0000 g | PACK | Freq: Every day | ORAL | Status: DC | PRN

## 2023-12-14 MED ORDER — DEXAMETHASONE SOD PHOSPHATE PF 10 MG/ML IJ SOLN
INTRAMUSCULAR | Status: DC | PRN
  Administered 2023-12-14: 5 mg via INTRAVENOUS

## 2023-12-14 MED ORDER — LACTATED RINGERS IV BOLUS
500.0000 mL | Freq: Once | INTRAVENOUS | Status: AC
  Administered 2023-12-14: 500 mL via INTRAVENOUS

## 2023-12-14 MED ORDER — ROCURONIUM BROMIDE 10 MG/ML (PF) SYRINGE
PREFILLED_SYRINGE | INTRAVENOUS | Status: DC | PRN
  Administered 2023-12-14: 50 mg via INTRAVENOUS
  Administered 2023-12-14: 20 mg via INTRAVENOUS

## 2023-12-14 MED ORDER — PHENYLEPHRINE HCL-NACL 20-0.9 MG/250ML-% IV SOLN
INTRAVENOUS | Status: DC | PRN
  Administered 2023-12-14: 50 ug/min via INTRAVENOUS

## 2023-12-14 MED ORDER — ONDANSETRON HCL 4 MG/2ML IJ SOLN: 4.0000 mg | Freq: Four times a day (QID) | INTRAMUSCULAR | Status: DC | PRN

## 2023-12-14 MED ORDER — FENTANYL CITRATE (PF) 250 MCG/5ML IJ SOLN
INTRAMUSCULAR | Status: DC | PRN
  Administered 2023-12-14 (×2): 25 ug via INTRAVENOUS

## 2023-12-14 MED ORDER — OXYCODONE HCL 5 MG PO TABS
10.0000 mg | ORAL_TABLET | ORAL | Status: DC | PRN
  Administered 2023-12-14: 10 mg via ORAL
  Filled 2023-12-14: qty 2

## 2023-12-14 MED ORDER — INSULIN ASPART 100 UNIT/ML IJ SOLN: 0.0000 [IU] | INTRAMUSCULAR | Status: DC | PRN

## 2023-12-14 MED ORDER — SUGAMMADEX SODIUM 200 MG/2ML IV SOLN
INTRAVENOUS | Status: DC | PRN
  Administered 2023-12-14: 200 mg via INTRAVENOUS

## 2023-12-14 MED ORDER — LIDOCAINE-EPINEPHRINE 1 %-1:100000 IJ SOLN
INTRAMUSCULAR | Status: AC
  Filled 2023-12-14: qty 1

## 2023-12-14 MED ORDER — ALBUMIN HUMAN 5 % IV SOLN
INTRAVENOUS | Status: AC
  Filled 2023-12-14: qty 250

## 2023-12-14 MED ORDER — INSULIN ASPART 100 UNIT/ML IJ SOLN
0.0000 [IU] | Freq: Three times a day (TID) | INTRAMUSCULAR | Status: DC
  Administered 2023-12-15: 2 [IU] via SUBCUTANEOUS
  Administered 2023-12-15 – 2023-12-16 (×2): 3 [IU] via SUBCUTANEOUS
  Filled 2023-12-14: qty 5
  Filled 2023-12-14: qty 2
  Filled 2023-12-14: qty 3

## 2023-12-14 MED ORDER — HYDRALAZINE HCL 20 MG/ML IJ SOLN: 10.0000 mg | Freq: Four times a day (QID) | INTRAMUSCULAR | Status: DC | PRN

## 2023-12-14 MED ORDER — SODIUM CHLORIDE 0.9 % IV SOLN: 10.0000 mL/h | Freq: Once | INTRAVENOUS | Status: DC

## 2023-12-14 MED ORDER — FUROSEMIDE 40 MG PO TABS
40.0000 mg | ORAL_TABLET | Freq: Every day | ORAL | Status: DC
  Administered 2023-12-15 – 2023-12-16 (×2): 40 mg via ORAL
  Filled 2023-12-14 (×2): qty 1

## 2023-12-14 MED ORDER — FENTANYL CITRATE (PF) 100 MCG/2ML IJ SOLN
INTRAMUSCULAR | Status: AC
  Filled 2023-12-14: qty 2

## 2023-12-14 MED ORDER — LIDOCAINE 2% (20 MG/ML) 5 ML SYRINGE
INTRAMUSCULAR | Status: DC | PRN
  Administered 2023-12-14: 40 mg via INTRAVENOUS

## 2023-12-14 MED ORDER — SODIUM CHLORIDE 0.9 % IV SOLN
2.0000 g | Freq: Once | INTRAVENOUS | Status: AC
  Administered 2023-12-14: 2 g via INTRAVENOUS
  Filled 2023-12-14 (×2): qty 20

## 2023-12-14 MED ORDER — VASOPRESSIN 20 UNIT/ML IV SOLN
INTRAVENOUS | Status: DC | PRN
  Administered 2023-12-14 (×2): 1 [IU] via INTRAVENOUS

## 2023-12-14 MED ORDER — SODIUM CHLORIDE 0.9 % IV SOLN: 250.0000 mL | INTRAVENOUS | Status: DC

## 2023-12-14 MED ORDER — OXYCODONE HCL 5 MG PO TABS
5.0000 mg | ORAL_TABLET | ORAL | Status: DC | PRN
  Administered 2023-12-14: 5 mg via ORAL
  Filled 2023-12-14: qty 1

## 2023-12-14 MED ORDER — NOREPINEPHRINE 4 MG/250ML-% IV SOLN
2.0000 ug/min | INTRAVENOUS | Status: DC
  Administered 2023-12-14: 8 ug/min via INTRAVENOUS
  Administered 2023-12-14: 6 ug/min via INTRAVENOUS

## 2023-12-14 MED ORDER — IOHEXOL 300 MG/ML  SOLN
100.0000 mL | Freq: Once | INTRAMUSCULAR | Status: AC | PRN
  Administered 2023-12-14: 50 mL via INTRAVENOUS

## 2023-12-14 MED ORDER — ONDANSETRON HCL 4 MG/2ML IJ SOLN
INTRAMUSCULAR | Status: DC | PRN
  Administered 2023-12-14: 4 mg via INTRAVENOUS

## 2023-12-14 MED ORDER — ALBUMIN HUMAN 5 % IV SOLN: INTRAVENOUS | Status: DC | PRN

## 2023-12-14 MED ORDER — ACETAMINOPHEN 500 MG PO TABS: 1000.0000 mg | ORAL_TABLET | Freq: Four times a day (QID) | ORAL | Status: DC | PRN

## 2023-12-14 MED ORDER — CHLORHEXIDINE GLUCONATE 0.12 % MT SOLN
15.0000 mL | Freq: Once | OROMUCOSAL | Status: AC
  Administered 2023-12-14: 15 mL via OROMUCOSAL
  Filled 2023-12-14: qty 15

## 2023-12-14 MED ORDER — ORAL CARE MOUTH RINSE: 15.0000 mL | Freq: Once | OROMUCOSAL | Status: AC

## 2023-12-14 MED ORDER — CHLORHEXIDINE GLUCONATE CLOTH 2 % EX PADS: 6.0000 | MEDICATED_PAD | Freq: Every day | CUTANEOUS | Status: DC

## 2023-12-14 MED ORDER — PHENYLEPHRINE 80 MCG/ML (10ML) SYRINGE FOR IV PUSH (FOR BLOOD PRESSURE SUPPORT)
PREFILLED_SYRINGE | INTRAVENOUS | Status: DC | PRN
  Administered 2023-12-14 (×2): 160 ug via INTRAVENOUS
  Administered 2023-12-14: 80 ug via INTRAVENOUS
  Administered 2023-12-14: 160 ug via INTRAVENOUS

## 2023-12-14 MED ORDER — IOHEXOL 300 MG/ML  SOLN
100.0000 mL | Freq: Once | INTRAMUSCULAR | Status: AC | PRN
  Administered 2023-12-14: 75 mL via INTRAVENOUS

## 2023-12-14 MED ORDER — NOREPINEPHRINE 4 MG/250ML-% IV SOLN: 0.0000 ug/min | INTRAVENOUS | Status: DC

## 2023-12-14 MED ORDER — NOREPINEPHRINE 4 MG/250ML-% IV SOLN
INTRAVENOUS | Status: DC | PRN
  Administered 2023-12-14: 3 ug/kg/min via INTRAVENOUS

## 2023-12-14 MED ORDER — SODIUM CHLORIDE 0.9 % IV SOLN: INTRAVENOUS | Status: DC

## 2023-12-14 MED ORDER — PROPOFOL 10 MG/ML IV BOLUS
INTRAVENOUS | Status: DC | PRN
  Administered 2023-12-14: 100 mg via INTRAVENOUS

## 2023-12-14 MED ORDER — FENTANYL CITRATE (PF) 50 MCG/ML IJ SOSY: 25.0000 ug | PREFILLED_SYRINGE | INTRAMUSCULAR | Status: DC | PRN

## 2023-12-14 MED ORDER — LACTULOSE 10 GM/15ML PO SOLN
20.0000 g | Freq: Two times a day (BID) | ORAL | Status: DC
  Administered 2023-12-14 – 2023-12-16 (×4): 20 g via ORAL
  Filled 2023-12-14 (×4): qty 30

## 2023-12-14 MED ORDER — DOCUSATE SODIUM 100 MG PO CAPS: 100.0000 mg | ORAL_CAPSULE | Freq: Two times a day (BID) | ORAL | Status: DC | PRN

## 2023-12-14 NOTE — Procedures (Signed)
 Interventional Radiology Procedure Note  Procedure:  1) Ultrasound guided right thoracentesis 2) TIPS creation 3) Left gastric vein embolization  Findings: Please refer to procedural dictation for full description. Mean portosystemic gradient (mmHg) pre TIPS 19, post TIPS 8.  2.8 L removed from right pleural space. 10 Fr right internal jugular and 8 Fr right greater saphenous vein access, manual compression for hemostasis.  Complications: None immediate  Estimated Blood Loss: < 5 mL  Recommendations: Admit to IR overnight for observation. Morning labs to include CBC, CMP, INR. Advance diet as tolerated. Begin lactulose.   Ester Sides, MD Pager: [phone] Clinic: [phone]

## 2023-12-14 NOTE — Anesthesia Procedure Notes (Signed)
 Procedure Name: Intubation Date/Time: 12/14/2023 12:42 PM  Performed by: Delores Dus, CRNAPre-anesthesia Checklist: Patient identified, Emergency Drugs available, Suction available and Patient being monitored Patient Re-evaluated:Patient Re-evaluated prior to induction Oxygen Delivery Method: Circle system utilized Preoxygenation: Pre-oxygenation with 100% oxygen Induction Type: IV induction Ventilation: Mask ventilation without difficulty Laryngoscope Size: McGrath and 3 Grade View: Grade I Tube type: Oral Tube size: 7.0 mm Number of attempts: 1 Airway Equipment and Method: Stylet and Oral airway Placement Confirmation: ETT inserted through vocal cords under direct vision, positive ETCO2 and breath sounds checked- equal and bilateral Secured at: 22 cm Tube secured with: Tape Dental Injury: Teeth and Oropharynx as per pre-operative assessment  Comments: Intubated by Dr. Epifanio

## 2023-12-14 NOTE — Progress Notes (Signed)
 Patient evaluated in PACU 03 by Dr. Jennefer. He is awake, alert and denies pain or discomfort. He is complaining of some nausea. His blood pressure has been low and he's currently on levophed. The patient will be admitted to the ICU over night. Patient is otherwise stable. Right internal jugular and right femoral vascular sites are clean, soft, dry and non-tender.   IR will see patient in the morning with tentative plans for discharge home 12/5.  Warren Dais, AGACNP-BC 12/14/2023, 5:02 PM
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
## 2023-12-14 NOTE — Anesthesia Procedure Notes (Signed)
 Arterial Line Insertion Start/End12/04/2023 8:00 AM, 12/14/2023 8:10 AM Performed by: Delores Dus, CRNA, CRNA  Patient location: Pre-op. Preanesthetic checklist: patient identified, IV checked, site marked, risks and benefits discussed, surgical consent, monitors and equipment checked, pre-op evaluation, timeout performed and anesthesia consent Lidocaine  1% used for infiltration Left, radial was placed Catheter size: 20 G Hand hygiene performed  and maximum sterile barriers used   Attempts: 1 Following insertion, dressing applied. Post procedure assessment: normal and unchanged

## 2023-12-14 NOTE — Sedation Documentation (Signed)
 R internal jugular venous sheath pulled at 1422, manual pressure held until 1431. Level 0  R fem venous sheath pulled at 1423, manual pressure held until 1425. Level 0

## 2023-12-14 NOTE — Anesthesia Preprocedure Evaluation (Addendum)
 Anesthesia Evaluation  Patient identified by MRN, date of birth, ID band Patient awake    Reviewed: Allergy & Precautions, H&P , NPO status , Patient's Chart, lab work & pertinent test results  Airway Mallampati: II  TM Distance: >3 FB Neck ROM: Full    Dental no notable dental hx. (+) Teeth Intact, Dental Advisory Given   Pulmonary sleep apnea , former smoker    + wheezing      Cardiovascular hypertension, Pt. on medications and Pt. on home beta blockers  Rhythm:Regular Rate:Normal     Neuro/Psych   Anxiety     negative neurological ROS     GI/Hepatic negative GI ROS,,,(+) Cirrhosis         Endo/Other  diabetes  Class 3 obesity  Renal/GU negative Renal ROS  negative genitourinary   Musculoskeletal   Abdominal   Peds  Hematology negative hematology ROS (+)   Anesthesia Other Findings   Reproductive/Obstetrics negative OB ROS                              Anesthesia Physical Anesthesia Plan  ASA: 3  Anesthesia Plan: General   Post-op Pain Management: Minimal or no pain anticipated   Induction: Intravenous  PONV Risk Score and Plan: 3 and Ondansetron  and Dexamethasone   Airway Management Planned: Oral ETT  Additional Equipment: Arterial line  Intra-op Plan:   Post-operative Plan: Extubation in OR  Informed Consent: I have reviewed the patients History and Physical, chart, labs and discussed the procedure including the risks, benefits and alternatives for the proposed anesthesia with the patient or authorized representative who has indicated his/her understanding and acceptance.     Dental advisory given  Plan Discussed with: CRNA  Anesthesia Plan Comments:          Anesthesia Quick Evaluation
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
## 2023-12-14 NOTE — Anesthesia Postprocedure Evaluation (Signed)
 Anesthesia Post Note  Patient: David Esparza  Procedure(s) Performed: IR TIPS IR US  GUIDE VASC ACCESS RIGHT IR THORACENTESIS ASP PLEURAL SPACE W/IMG GUIDE IR EMBO ARTERIAL NOT HEMORR HEMANG INC GUIDE ROADMAPPING IR ANGIOGRAM SELECTIVE EACH ADDITIONAL VESSEL INSERTION, SHUNT, INTRAHEPATIC PORTOSYSTEMIC, TRANSJUGULAR     Patient location during evaluation: PACU Anesthesia Type: General Level of consciousness: awake and alert Pain management: pain level controlled Vital Signs Assessment: post-procedure vital signs reviewed and stable Respiratory status: spontaneous breathing, nonlabored ventilation, respiratory function stable and patient connected to nasal cannula oxygen Cardiovascular status: unstable Postop Assessment: no apparent nausea or vomiting Anesthetic complications: no Comments: Pts BP requiring Norepinephrine for support. Will need ICU level of care. I have consulted CCM and they will see the patient in PACU.    No notable events documented.  Last Vitals:  Vitals:   12/14/23 1545 12/14/23 1600  BP: 112/65 110/65  Pulse: 81 82  Resp: (!) 26 (!) 23  Temp:    SpO2: 93% 95%    Last Pain:  Vitals:   12/14/23 1600  TempSrc:   PainSc: 0-No pain                 Dryden Tapley,W. EDMOND

## 2023-12-14 NOTE — Sedation Documentation (Signed)
 Pre tips pressure: Right atrium 20 Main portal vein 39 Mean gradient 19  Post tips pressure: Right atrium 21 Main portal vein 32 Mean gradient 11

## 2023-12-14 NOTE — Transfer of Care (Signed)
 Immediate Anesthesia Transfer of Care Note  Patient: David Esparza  Procedure(s) Performed: INSERTION, SHUNT, INTRAHEPATIC PORTOSYSTEMIC, TRANSJUGULAR  Patient Location: PACU  Anesthesia Type:General  Level of Consciousness: awake, alert , and oriented  Airway & Oxygen Therapy: Patient Spontanous Breathing and Patient connected to face mask oxygen  Post-op Assessment: Report given to RN and Post -op Vital signs reviewed and unstable, Anesthesiologist notified  Post vital signs: Reviewed and stable  Last Vitals:  Vitals Value Taken Time  BP 99/62 12/14/23 15:00  Temp    Pulse 78 12/14/23 15:10  Resp 17 12/14/23 15:10  SpO2 97 % 12/14/23 15:10  Vitals shown include unfiled device data.  Last Pain:  Vitals:   12/14/23 0916  TempSrc:   PainSc: 0-No pain         Complications: No notable events documented.

## 2023-12-15 ENCOUNTER — Inpatient Hospital Stay (HOSPITAL_COMMUNITY)

## 2023-12-15 ENCOUNTER — Encounter (HOSPITAL_COMMUNITY): Payer: Self-pay | Admitting: Interventional Radiology

## 2023-12-15 LAB — MAGNESIUM: Magnesium: 2 mg/dL (ref 1.7–2.4)

## 2023-12-15 LAB — GLUCOSE, CAPILLARY
Glucose-Capillary: 136 mg/dL — ABNORMAL HIGH (ref 70–99)
Glucose-Capillary: 147 mg/dL — ABNORMAL HIGH (ref 70–99)
Glucose-Capillary: 153 mg/dL — ABNORMAL HIGH (ref 70–99)
Glucose-Capillary: 139 mg/dL — ABNORMAL HIGH (ref 70–99)

## 2023-12-15 LAB — COMPREHENSIVE METABOLIC PANEL WITH GFR
ALT: 43 U/L (ref 0–44)
AST: 84 U/L — ABNORMAL HIGH (ref 15–41)
Albumin: 2.5 g/dL — ABNORMAL LOW (ref 3.5–5.0)
Alkaline Phosphatase: 73 U/L (ref 38–126)
Anion gap: 10 (ref 5–15)
BUN: 20 mg/dL (ref 8–23)
CO2: 28 mmol/L (ref 22–32)
Calcium: 8.4 mg/dL — ABNORMAL LOW (ref 8.9–10.3)
Chloride: 101 mmol/L (ref 98–111)
Creatinine, Ser: 0.93 mg/dL (ref 0.61–1.24)
GFR, Estimated: >60 mL/min (ref >60)
Glucose, Bld: 173 mg/dL — ABNORMAL HIGH (ref 70–99)
Potassium: 3.9 mmol/L (ref 3.5–5.1)
Sodium: 139 mmol/L (ref 135–145)
Total Bilirubin: 2.6 mg/dL — ABNORMAL HIGH (ref 0.0–1.2)
Total Protein: 5.9 g/dL — ABNORMAL LOW (ref 6.5–8.1)

## 2023-12-15 LAB — PROTIME-INR
INR: 1.8 — ABNORMAL HIGH (ref 0.8–1.2)
Prothrombin Time: 22.2 s — ABNORMAL HIGH (ref 11.4–15.2)

## 2023-12-15 LAB — PHOSPHORUS: Phosphorus: 3.6 mg/dL (ref 2.5–4.6)

## 2023-12-15 LAB — CBC
HCT: 32.4 % — ABNORMAL LOW (ref 39.0–52.0)
Hemoglobin: 11.2 g/dL — ABNORMAL LOW (ref 13.0–17.0)
MCH: 34.4 pg — ABNORMAL HIGH (ref 26.0–34.0)
MCHC: 34.6 g/dL (ref 30.0–36.0)
MCV: 99.4 fL (ref 80.0–100.0)
Platelets: 41 K/uL — ABNORMAL LOW (ref 150–400)
RBC: 3.26 MIL/uL — ABNORMAL LOW (ref 4.22–5.81)
RDW: 14.1 % (ref 11.5–15.5)
WBC: 7 K/uL (ref 4.0–10.5)
nRBC: 0 % (ref 0.0–0.2)

## 2023-12-15 MED ORDER — LACTULOSE 10 GM/15ML PO SOLN
20.0000 g | Freq: Once | ORAL | Status: AC
  Administered 2023-12-15: 20 g via ORAL
  Filled 2023-12-15: qty 30

## 2023-12-15 MED ORDER — CHLORHEXIDINE GLUCONATE CLOTH 2 % EX PADS
6.0000 | MEDICATED_PAD | Freq: Every day | CUTANEOUS | Status: DC
  Administered 2023-12-16: 6 via TOPICAL

## 2023-12-15 NOTE — Progress Notes (Signed)
 Pt arrived to floor via WC. Pt a+ox4. No c/o pain. Educated pt to use the call bell to call, placed it with in reach. Pt has no needs at this time.

## 2023-12-15 NOTE — Progress Notes (Signed)
 Referring Physician(s): Dr. Paula Southerly  Supervising Physician: Jenna Hacker  Patient Status:  [facility] - In-pt  Chief Complaint: Portal hypertension Recurrent hydrothorax  Subjective: Resting comfortably in bed.  States he has not been OOB or voided on his own.  L forearm IV remains in place.  Has been able to eat/drink with tolerance.  States he typically gets symptomatic fluid reaccumulation within 24 hrs and has felt like he is breathing clear since yesterday.  New to lactulose  and has not had a BM today.   Allergies: Patient has no known allergies.  Medications: Prior to Admission medications   Medication Sig Start Date End Date Taking? Authorizing Provider  carvedilol  (COREG ) 12.5 MG tablet Take 1 tablet (12.5 mg total) by mouth daily. Patient needs follow up appointment for future refills. Please call [phone] to schedule an appointment. 10/06/23  Yes Cirigliano, Vito V, DO  furosemide  (LASIX ) 40 MG tablet Take 2 tablets in the morning and 1 tablet in the afternoon. 11/13/23  Yes Alghanim, Fahid, MD  lisinopril  (ZESTRIL ) 2.5 MG tablet TAKE 1 TABLET (2.5 MG TOTAL) BY MOUTH DAILY. 11/06/23  Yes Daryl Setter, NP  albuterol  (VENTOLIN  HFA) 108 (90 Base) MCG/ACT inhaler Inhale 2 puffs into the lungs every 6 (six) hours as needed for wheezing or shortness of breath. Patient not taking: Reported on 12/11/2023 08/22/23   Frann Mabel Mt, DO     Vital Signs: BP 110/74   Pulse 77   Temp 98.1 F (36.7 C) (Oral)   Resp 11   Ht 5' 7 (1.702 m)   Wt 230 lb (104.3 kg)   SpO2 93%   BMI 36.02 kg/m   Physical Exam Vitals and nursing note reviewed.  Constitutional:      General: He is not in acute distress.    Appearance: Normal appearance. He is not ill-appearing.  HENT:     Mouth/Throat:     Mouth: Mucous membranes are moist.     Pharynx: Oropharynx is clear.  Neck:     Comments: R internal jugular puncture site stable.  Dressing left in place as it  is clean and dry.  Soft, no bruising.  Cardiovascular:     Rate and Rhythm: Normal rate.  Pulmonary:     Effort: Pulmonary effort is normal. No respiratory distress.  Abdominal:     General: Abdomen is flat. There is no distension.     Palpations: Abdomen is soft.  Skin:    General: Skin is warm and dry.     Comments: R groin procedure site soft and intact.  Dressing left in place.  Neurological:     General: No focal deficit present.     Mental Status: He is alert and oriented to person, place, and time. Mental status is at baseline.  Psychiatric:        Mood and Affect: Mood normal.        Behavior: Behavior normal.        Thought Content: Thought content normal.        Judgment: Judgment normal.     Imaging: DG CHEST PORT 1 VIEW Result Date: 12/15/2023 CLINICAL DATA:  Respiratory failure EXAM: PORTABLE CHEST 1 VIEW COMPARISON:  Prior chest x-ray 12/14/2023 FINDINGS: Cardiac and mediastinal contours remain unchanged. Moderate to large layering right pleural effusion with associated atelectasis. The left lung is relatively clear. No new airspace opacification. Coil mass in the mid epigastric region present consistent with recent coil embolization of varices. IMPRESSION: Moderate to large layering  right pleural effusion with associated right lower and middle lobe atelectasis. Electronically Signed   By: Wilkie Lent M.D.   On: 12/15/2023 11:49   IR Tips Result Date: 12/14/2023 CLINICAL DATA:  73 year old male with history of decompensated NASH cirrhosis with recurrent right hepatic hydrothorax and large gastroesophageal varices. EXAM: 1. Ultrasound-guided right thoracentesis 2. Ultrasound-guided access of the right internal jugular vein 3. Ultrasound-guided access of the right common femoral vein 4. Intravascular ultrasound 5. Catheterization of the portal vein 6. Portal venous and central manometry 7. Portal venogram 8. Creation of a transhepatic portal vein to hepatic vein shunt 9.  Coil embolization of left gastric vein. MEDICATIONS: As antibiotic prophylaxis, Rocephin  1 gm IV was ordered pre-procedure and administered intravenously within one hour of incision. ANESTHESIA/SEDATION: General - as administered by the Anesthesia department CONTRAST:  One hundred twenty-five ML Omnipaque  300, intravenous FLUOROSCOPY TIME:  Seven hundred ninety-one mGy reference air kerma COMPLICATIONS: None immediate. PROCEDURE: Informed written consent was obtained from the patient after a thorough discussion of the procedural risks, benefits and alternatives. All questions were addressed. Maximal Sterile Barrier Technique was utilized including caps, mask, sterile gowns, sterile gloves, sterile drape, hand hygiene and skin antiseptic. A timeout was performed prior to the initiation of the procedure. Preprocedure ultrasound evaluation of the right posterior chest demonstrated large pleural effusion. The procedure was planned. Subdermal Local anesthesia was administered at the planned needle entry site with 1% lidocaine . Deeper local anesthetic was administered under ultrasound visualization to the level of the pleura. A small skin nick was made. Under direct ultrasound visualization, a 6 French skater drain was introduced and trocar fashion into the right pleural space. The inner stylet was removed. A total of 2.8 L of translucent, straw-colored fluid was removed. The catheter was then removed. A sterile bandage was applied. A preliminary ultrasound of the right groin was performed and demonstrates a patent right common femoral vein and greater saphenous vein. A permanent ultrasound image was recorded. Using a combination of fluoroscopy and ultrasound, an access site was determined. A small dermatotomy was made at the planned puncture site. Using ultrasound guidance, access into the right greater saphenous vein was obtained with visualization of needle entry into the vessel using a standard micropuncture  technique. A wire was advanced into the IVC insert all fascial dilation performed. An 8 French, 11 cm vascular sheath was placed into the external iliac vein. Through this access site, an 51 French Accunav ICE catheter was advanced with ease under fluoroscopic guidance to the level of the intrahepatic inferior vena cava. A preliminary ultrasound of the right neck was performed and demonstrates a patent internal jugular vein. A permanent ultrasound image was recorded. Using a combination of fluoroscopy and ultrasound, an access site was determined. A small dermatotomy was made at the planned puncture site. Using ultrasound guidance, access into the right internal jugular vein was obtained with visualization of needle entry into the vessel using a standard micropuncture technique. A wire was advanced into the IVC and serial fascial dilation performed. A 10 French tips sheath was placed into the internal jugular vein and advanced to the IVC. The jugular sheath was retracted into the right atrium and manometry was performed. A 5 French angled tip catheter was then directed into the middle hepatic vein. The catheter was advanced to a wedge portion of the a patent vein over which the 10 French sheath was advanced into the middle hepatic vein. Using ICE ultrasound visualization the catheter as middle hepatic vein  as well as the portal anatomy was defined. A planned exit site from the hepatic vein and puncture site from the portal vein was placed into a single sonographic plane. Under direct ultrasound visualization, the ScorpionX needle was advanced into the central right portal vein. Hand injection of contrast confirmed position within the portal system. A Glidewire Advantage was then advanced into the splenic vein. A 5 French marking pigtail catheter was then advanced over the wire into the main portal vein and wire removed. Portal venogram was performed which demonstrated a patent portal vein. A large left gastric vein  with retrograde filling into multiple gastroesophageal varices was seen arising from the main portal vein. Portal manometry was then performed. The tract was then dilated to 8 mm with an 8 mm x 8 cm Athletis balloon. A 8-10 mm by 7 + 2 cm of Viatorr endograft was placed. This was ultimately dilated to 8 mm. After placement of the shunt, right atrial and portal pressures were repeated. A C2 catheter was then directed into the left gastric vein. Dedicated venogram demonstrated retrograde flow into large gastroesophageal varices. Coil embolization was then performed with an assortment of 0.035 detachable Azur coils. Completion portal venogram demonstrates a patent TIPS endograft without complete embolization of the left gastric vein. The catheters and sheath were removed and manual compression was applied to the right internal jugular and right common femoral venous access sites until hemostasis was achieved. The patient was transferred to the PACU in stable condition. Pre-TIPS Mean Pressures (mmHg): Right atrium: 20 Portal vein: 39 Portosystemic gradient: 19 Post-TIPS Mean Pressures (mmHg): Right atrium:24 Portal vein: 32 Portosystemic gradient: 8 IMPRESSION: 1. Successful transjugular portosystemic shunt creation. 2. Technically successful coil embolization of the left gastric vein. 3. Portosystemic gradient of 19 mm Hg (absolute portal venous pressure 39 mm Hg) before shunt placement and 8 mm Hg (absolute portal venous pressure 33 mm Hg) after shunt placement and coil embolization. Ester Sides, MD [facility] Radiology Electronically Signed   By: Ester Sides M.D.   On: 12/14/2023 21:37   IR US  Guide Vasc Access Right Result Date: 12/14/2023 CLINICAL DATA:  73 year old male with history of decompensated NASH cirrhosis with recurrent right hepatic hydrothorax and large gastroesophageal varices. EXAM: 1. Ultrasound-guided right thoracentesis 2. Ultrasound-guided  access of the right internal jugular vein 3. Ultrasound-guided access of the right common femoral vein 4. Intravascular ultrasound 5. Catheterization of the portal vein 6. Portal venous and central manometry 7. Portal venogram 8. Creation of a transhepatic portal vein to hepatic vein shunt 9. Coil embolization of left gastric vein. MEDICATIONS: As antibiotic prophylaxis, Rocephin  1 gm IV was ordered pre-procedure and administered intravenously within one hour of incision. ANESTHESIA/SEDATION: General - as administered by the Anesthesia department CONTRAST:  One hundred twenty-five ML Omnipaque  300, intravenous FLUOROSCOPY TIME:  Seven hundred ninety-one mGy reference air kerma COMPLICATIONS: None immediate. PROCEDURE: Informed written consent was obtained from the patient after a thorough discussion of the procedural risks, benefits and alternatives. All questions were addressed. Maximal Sterile Barrier Technique was utilized including caps, mask, sterile gowns, sterile gloves, sterile drape, hand hygiene and skin antiseptic. A timeout was performed prior to the initiation of the procedure. Preprocedure ultrasound evaluation of the right posterior chest demonstrated large pleural effusion. The procedure was planned. Subdermal Local anesthesia was administered at the planned needle entry site with 1% lidocaine . Deeper local anesthetic was administered under ultrasound visualization to the level of the pleura. A small skin nick  was made. Under direct ultrasound visualization, a 6 French skater drain was introduced and trocar fashion into the right pleural space. The inner stylet was removed. A total of 2.8 L of translucent, straw-colored fluid was removed. The catheter was then removed. A sterile bandage was applied. A preliminary ultrasound of the right groin was performed and demonstrates a patent right common femoral vein and greater saphenous vein. A permanent ultrasound image was recorded. Using a combination of  fluoroscopy and ultrasound, an access site was determined. A small dermatotomy was made at the planned puncture site. Using ultrasound guidance, access into the right greater saphenous vein was obtained with visualization of needle entry into the vessel using a standard micropuncture technique. A wire was advanced into the IVC insert all fascial dilation performed. An 8 French, 11 cm vascular sheath was placed into the external iliac vein. Through this access site, an 27 French Accunav ICE catheter was advanced with ease under fluoroscopic guidance to the level of the intrahepatic inferior vena cava. A preliminary ultrasound of the right neck was performed and demonstrates a patent internal jugular vein. A permanent ultrasound image was recorded. Using a combination of fluoroscopy and ultrasound, an access site was determined. A small dermatotomy was made at the planned puncture site. Using ultrasound guidance, access into the right internal jugular vein was obtained with visualization of needle entry into the vessel using a standard micropuncture technique. A wire was advanced into the IVC and serial fascial dilation performed. A 10 French tips sheath was placed into the internal jugular vein and advanced to the IVC. The jugular sheath was retracted into the right atrium and manometry was performed. A 5 French angled tip catheter was then directed into the middle hepatic vein. The catheter was advanced to a wedge portion of the a patent vein over which the 10 French sheath was advanced into the middle hepatic vein. Using ICE ultrasound visualization the catheter as middle hepatic vein as well as the portal anatomy was defined. A planned exit site from the hepatic vein and puncture site from the portal vein was placed into a single sonographic plane. Under direct ultrasound visualization, the ScorpionX needle was advanced into the central right portal vein. Hand injection of contrast confirmed position within the  portal system. A Glidewire Advantage was then advanced into the splenic vein. A 5 French marking pigtail catheter was then advanced over the wire into the main portal vein and wire removed. Portal venogram was performed which demonstrated a patent portal vein. A large left gastric vein with retrograde filling into multiple gastroesophageal varices was seen arising from the main portal vein. Portal manometry was then performed. The tract was then dilated to 8 mm with an 8 mm x 8 cm Athletis balloon. A 8-10 mm by 7 + 2 cm of Viatorr endograft was placed. This was ultimately dilated to 8 mm. After placement of the shunt, right atrial and portal pressures were repeated. A C2 catheter was then directed into the left gastric vein. Dedicated venogram demonstrated retrograde flow into large gastroesophageal varices. Coil embolization was then performed with an assortment of 0.035 detachable Azur coils. Completion portal venogram demonstrates a patent TIPS endograft without complete embolization of the left gastric vein. The catheters and sheath were removed and manual compression was applied to the right internal jugular and right common femoral venous access sites until hemostasis was achieved. The patient was transferred to the PACU in stable condition. Pre-TIPS Mean Pressures (mmHg): Right atrium: 20 Portal vein:  39 Portosystemic gradient: 19 Post-TIPS Mean Pressures (mmHg): Right atrium:24 Portal vein: 32 Portosystemic gradient: 8 IMPRESSION: 1. Successful transjugular portosystemic shunt creation. 2. Technically successful coil embolization of the left gastric vein. 3. Portosystemic gradient of 19 mm Hg (absolute portal venous pressure 39 mm Hg) before shunt placement and 8 mm Hg (absolute portal venous pressure 33 mm Hg) after shunt placement and coil embolization. Ester Sides, MD [facility] Radiology Electronically Signed   By: Ester Sides M.D.   On: 12/14/2023  21:37   IR US  Guide Vasc Access Right Result Date: 12/14/2023 CLINICAL DATA:  73 year old male with history of decompensated NASH cirrhosis with recurrent right hepatic hydrothorax and large gastroesophageal varices. EXAM: 1. Ultrasound-guided right thoracentesis 2. Ultrasound-guided access of the right internal jugular vein 3. Ultrasound-guided access of the right common femoral vein 4. Intravascular ultrasound 5. Catheterization of the portal vein 6. Portal venous and central manometry 7. Portal venogram 8. Creation of a transhepatic portal vein to hepatic vein shunt 9. Coil embolization of left gastric vein. MEDICATIONS: As antibiotic prophylaxis, Rocephin  1 gm IV was ordered pre-procedure and administered intravenously within one hour of incision. ANESTHESIA/SEDATION: General - as administered by the Anesthesia department CONTRAST:  One hundred twenty-five ML Omnipaque  300, intravenous FLUOROSCOPY TIME:  Seven hundred ninety-one mGy reference air kerma COMPLICATIONS: None immediate. PROCEDURE: Informed written consent was obtained from the patient after a thorough discussion of the procedural risks, benefits and alternatives. All questions were addressed. Maximal Sterile Barrier Technique was utilized including caps, mask, sterile gowns, sterile gloves, sterile drape, hand hygiene and skin antiseptic. A timeout was performed prior to the initiation of the procedure. Preprocedure ultrasound evaluation of the right posterior chest demonstrated large pleural effusion. The procedure was planned. Subdermal Local anesthesia was administered at the planned needle entry site with 1% lidocaine . Deeper local anesthetic was administered under ultrasound visualization to the level of the pleura. A small skin nick was made. Under direct ultrasound visualization, a 6 French skater drain was introduced and trocar fashion into the right pleural space. The inner stylet was removed. A total of 2.8 L of translucent,  straw-colored fluid was removed. The catheter was then removed. A sterile bandage was applied. A preliminary ultrasound of the right groin was performed and demonstrates a patent right common femoral vein and greater saphenous vein. A permanent ultrasound image was recorded. Using a combination of fluoroscopy and ultrasound, an access site was determined. A small dermatotomy was made at the planned puncture site. Using ultrasound guidance, access into the right greater saphenous vein was obtained with visualization of needle entry into the vessel using a standard micropuncture technique. A wire was advanced into the IVC insert all fascial dilation performed. An 8 French, 11 cm vascular sheath was placed into the external iliac vein. Through this access site, an 106 French Accunav ICE catheter was advanced with ease under fluoroscopic guidance to the level of the intrahepatic inferior vena cava. A preliminary ultrasound of the right neck was performed and demonstrates a patent internal jugular vein. A permanent ultrasound image was recorded. Using a combination of fluoroscopy and ultrasound, an access site was determined. A small dermatotomy was made at the planned puncture site. Using ultrasound guidance, access into the right internal jugular vein was obtained with visualization of needle entry into the vessel using a standard micropuncture technique. A wire was advanced into the IVC and serial fascial dilation performed. A 10 French tips sheath was placed into the internal  jugular vein and advanced to the IVC. The jugular sheath was retracted into the right atrium and manometry was performed. A 5 French angled tip catheter was then directed into the middle hepatic vein. The catheter was advanced to a wedge portion of the a patent vein over which the 10 French sheath was advanced into the middle hepatic vein. Using ICE ultrasound visualization the catheter as middle hepatic vein as well as the portal anatomy was  defined. A planned exit site from the hepatic vein and puncture site from the portal vein was placed into a single sonographic plane. Under direct ultrasound visualization, the ScorpionX needle was advanced into the central right portal vein. Hand injection of contrast confirmed position within the portal system. A Glidewire Advantage was then advanced into the splenic vein. A 5 French marking pigtail catheter was then advanced over the wire into the main portal vein and wire removed. Portal venogram was performed which demonstrated a patent portal vein. A large left gastric vein with retrograde filling into multiple gastroesophageal varices was seen arising from the main portal vein. Portal manometry was then performed. The tract was then dilated to 8 mm with an 8 mm x 8 cm Athletis balloon. A 8-10 mm by 7 + 2 cm of Viatorr endograft was placed. This was ultimately dilated to 8 mm. After placement of the shunt, right atrial and portal pressures were repeated. A C2 catheter was then directed into the left gastric vein. Dedicated venogram demonstrated retrograde flow into large gastroesophageal varices. Coil embolization was then performed with an assortment of 0.035 detachable Azur coils. Completion portal venogram demonstrates a patent TIPS endograft without complete embolization of the left gastric vein. The catheters and sheath were removed and manual compression was applied to the right internal jugular and right common femoral venous access sites until hemostasis was achieved. The patient was transferred to the PACU in stable condition. Pre-TIPS Mean Pressures (mmHg): Right atrium: 20 Portal vein: 39 Portosystemic gradient: 19 Post-TIPS Mean Pressures (mmHg): Right atrium:24 Portal vein: 32 Portosystemic gradient: 8 IMPRESSION: 1. Successful transjugular portosystemic shunt creation. 2. Technically successful coil embolization of the left gastric vein. 3. Portosystemic gradient of 19 mm Hg (absolute portal  venous pressure 39 mm Hg) before shunt placement and 8 mm Hg (absolute portal venous pressure 33 mm Hg) after shunt placement and coil embolization. Ester Sides, MD [facility] Radiology Electronically Signed   By: Ester Sides M.D.   On: 12/14/2023 21:37   IR THORACENTESIS ASP PLEURAL SPACE W/IMG GUIDE Result Date: 12/14/2023 CLINICAL DATA:  73 year old male with history of decompensated NASH cirrhosis with recurrent right hepatic hydrothorax and large gastroesophageal varices. EXAM: 1. Ultrasound-guided right thoracentesis 2. Ultrasound-guided access of the right internal jugular vein 3. Ultrasound-guided access of the right common femoral vein 4. Intravascular ultrasound 5. Catheterization of the portal vein 6. Portal venous and central manometry 7. Portal venogram 8. Creation of a transhepatic portal vein to hepatic vein shunt 9. Coil embolization of left gastric vein. MEDICATIONS: As antibiotic prophylaxis, Rocephin  1 gm IV was ordered pre-procedure and administered intravenously within one hour of incision. ANESTHESIA/SEDATION: General - as administered by the Anesthesia department CONTRAST:  One hundred twenty-five ML Omnipaque  300, intravenous FLUOROSCOPY TIME:  Seven hundred ninety-one mGy reference air kerma COMPLICATIONS: None immediate. PROCEDURE: Informed written consent was obtained from the patient after a thorough discussion of the procedural risks, benefits and alternatives. All questions were addressed. Maximal Sterile Barrier Technique was utilized including caps,  mask, sterile gowns, sterile gloves, sterile drape, hand hygiene and skin antiseptic. A timeout was performed prior to the initiation of the procedure. Preprocedure ultrasound evaluation of the right posterior chest demonstrated large pleural effusion. The procedure was planned. Subdermal Local anesthesia was administered at the planned needle entry site with 1% lidocaine . Deeper  local anesthetic was administered under ultrasound visualization to the level of the pleura. A small skin nick was made. Under direct ultrasound visualization, a 6 French skater drain was introduced and trocar fashion into the right pleural space. The inner stylet was removed. A total of 2.8 L of translucent, straw-colored fluid was removed. The catheter was then removed. A sterile bandage was applied. A preliminary ultrasound of the right groin was performed and demonstrates a patent right common femoral vein and greater saphenous vein. A permanent ultrasound image was recorded. Using a combination of fluoroscopy and ultrasound, an access site was determined. A small dermatotomy was made at the planned puncture site. Using ultrasound guidance, access into the right greater saphenous vein was obtained with visualization of needle entry into the vessel using a standard micropuncture technique. A wire was advanced into the IVC insert all fascial dilation performed. An 8 French, 11 cm vascular sheath was placed into the external iliac vein. Through this access site, an 27 French Accunav ICE catheter was advanced with ease under fluoroscopic guidance to the level of the intrahepatic inferior vena cava. A preliminary ultrasound of the right neck was performed and demonstrates a patent internal jugular vein. A permanent ultrasound image was recorded. Using a combination of fluoroscopy and ultrasound, an access site was determined. A small dermatotomy was made at the planned puncture site. Using ultrasound guidance, access into the right internal jugular vein was obtained with visualization of needle entry into the vessel using a standard micropuncture technique. A wire was advanced into the IVC and serial fascial dilation performed. A 10 French tips sheath was placed into the internal jugular vein and advanced to the IVC. The jugular sheath was retracted into the right atrium and manometry was performed. A 5 French angled  tip catheter was then directed into the middle hepatic vein. The catheter was advanced to a wedge portion of the a patent vein over which the 10 French sheath was advanced into the middle hepatic vein. Using ICE ultrasound visualization the catheter as middle hepatic vein as well as the portal anatomy was defined. A planned exit site from the hepatic vein and puncture site from the portal vein was placed into a single sonographic plane. Under direct ultrasound visualization, the ScorpionX needle was advanced into the central right portal vein. Hand injection of contrast confirmed position within the portal system. A Glidewire Advantage was then advanced into the splenic vein. A 5 French marking pigtail catheter was then advanced over the wire into the main portal vein and wire removed. Portal venogram was performed which demonstrated a patent portal vein. A large left gastric vein with retrograde filling into multiple gastroesophageal varices was seen arising from the main portal vein. Portal manometry was then performed. The tract was then dilated to 8 mm with an 8 mm x 8 cm Athletis balloon. A 8-10 mm by 7 + 2 cm of Viatorr endograft was placed. This was ultimately dilated to 8 mm. After placement of the shunt, right atrial and portal pressures were repeated. A C2 catheter was then directed into the left gastric vein. Dedicated venogram demonstrated retrograde flow into large gastroesophageal varices. Coil embolization was then  performed with an assortment of 0.035 detachable Azur coils. Completion portal venogram demonstrates a patent TIPS endograft without complete embolization of the left gastric vein. The catheters and sheath were removed and manual compression was applied to the right internal jugular and right common femoral venous access sites until hemostasis was achieved. The patient was transferred to the PACU in stable condition. Pre-TIPS Mean Pressures (mmHg): Right atrium: 20 Portal vein: 39  Portosystemic gradient: 19 Post-TIPS Mean Pressures (mmHg): Right atrium:24 Portal vein: 32 Portosystemic gradient: 8 IMPRESSION: 1. Successful transjugular portosystemic shunt creation. 2. Technically successful coil embolization of the left gastric vein. 3. Portosystemic gradient of 19 mm Hg (absolute portal venous pressure 39 mm Hg) before shunt placement and 8 mm Hg (absolute portal venous pressure 33 mm Hg) after shunt placement and coil embolization. Ester Sides, MD [facility] Radiology Electronically Signed   By: Ester Sides M.D.   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 EXAM: 1. Ultrasound-guided right thoracentesis 2. Ultrasound-guided access of the right internal jugular vein 3. Ultrasound-guided access of the right common femoral vein 4. Intravascular ultrasound 5. Catheterization of the portal vein 6. Portal venous and central manometry 7. Portal venogram 8. Creation of a transhepatic portal vein to hepatic vein shunt 9. Coil embolization of left gastric vein. MEDICATIONS: As antibiotic prophylaxis, Rocephin  1 gm IV was ordered pre-procedure and administered intravenously within one hour of incision. ANESTHESIA/SEDATION: General - as administered by the Anesthesia department CONTRAST:  One hundred twenty-five ML Omnipaque  300, intravenous FLUOROSCOPY TIME:  Seven hundred ninety-one mGy reference air kerma COMPLICATIONS: None immediate. PROCEDURE: Informed written consent was obtained from the patient after a thorough discussion of the procedural risks, benefits and alternatives. All questions were addressed. Maximal Sterile Barrier Technique was utilized including caps, mask, sterile gowns, sterile gloves, sterile drape, hand hygiene  and skin antiseptic. A timeout was performed prior to the initiation of the procedure. Preprocedure ultrasound evaluation of the right posterior chest demonstrated large pleural effusion. The procedure was planned. Subdermal Local anesthesia was administered at the planned needle entry site with 1% lidocaine . Deeper local anesthetic was administered under ultrasound visualization to the level of the pleura. A small skin nick was made. Under direct ultrasound visualization, a 6 French skater drain was introduced and trocar fashion into the right pleural space. The inner stylet was removed. A total of 2.8 L of translucent, straw-colored fluid was removed. The catheter was then removed. A sterile bandage was applied. A preliminary ultrasound of the right groin was performed and demonstrates a patent right common femoral vein and greater saphenous vein. A permanent ultrasound image was recorded. Using a combination of fluoroscopy and ultrasound, an access site was determined. A small dermatotomy was made at the planned puncture site. Using ultrasound guidance, access into the right greater saphenous vein was obtained with visualization of needle entry into the vessel using a standard micropuncture technique. A wire was advanced into the IVC insert all fascial dilation performed. An 8 French, 11 cm vascular sheath was placed into the external iliac vein. Through this access site, an 61 French Accunav ICE catheter was advanced with ease under fluoroscopic guidance to the level of the intrahepatic inferior vena cava. A preliminary ultrasound of the right neck was performed and demonstrates a patent internal jugular vein. A  permanent ultrasound image was recorded. Using a combination of fluoroscopy and ultrasound, an access site was determined. A small dermatotomy was made at the planned puncture site. Using ultrasound guidance, access into the right internal jugular vein was obtained with visualization of needle entry into  the vessel using a standard micropuncture technique. A wire was advanced into the IVC and serial fascial dilation performed. A 10 French tips sheath was placed into the internal jugular vein and advanced to the IVC. The jugular sheath was retracted into the right atrium and manometry was performed. A 5 French angled tip catheter was then directed into the middle hepatic vein. The catheter was advanced to a wedge portion of the a patent vein over which the 10 French sheath was advanced into the middle hepatic vein. Using ICE ultrasound visualization the catheter as middle hepatic vein as well as the portal anatomy was defined. A planned exit site from the hepatic vein and puncture site from the portal vein was placed into a single sonographic plane. Under direct ultrasound visualization, the ScorpionX needle was advanced into the central right portal vein. Hand injection of contrast confirmed position within the portal system. A Glidewire Advantage was then advanced into the splenic vein. A 5 French marking pigtail catheter was then advanced over the wire into the main portal vein and wire removed. Portal venogram was performed which demonstrated a patent portal vein. A large left gastric vein with retrograde filling into multiple gastroesophageal varices was seen arising from the main portal vein. Portal manometry was then performed. The tract was then dilated to 8 mm with an 8 mm x 8 cm Athletis balloon. A 8-10 mm by 7 + 2 cm of Viatorr endograft was placed. This was ultimately dilated to 8 mm. After placement of the shunt, right atrial and portal pressures were repeated. A C2 catheter was then directed into the left gastric vein. Dedicated venogram demonstrated retrograde flow into large gastroesophageal varices. Coil embolization was then performed with an assortment of 0.035 detachable Azur coils. Completion portal venogram demonstrates a patent TIPS endograft without complete embolization of the left gastric  vein. The catheters and sheath were removed and manual compression was applied to the right internal jugular and right common femoral venous access sites until hemostasis was achieved. The patient was transferred to the PACU in stable condition. Pre-TIPS Mean Pressures (mmHg): Right atrium: 20 Portal vein: 39 Portosystemic gradient: 19 Post-TIPS Mean Pressures (mmHg): Right atrium:24 Portal vein: 32 Portosystemic gradient: 8 IMPRESSION: 1. Successful transjugular portosystemic shunt creation. 2. Technically successful coil embolization of the left gastric vein. 3. Portosystemic gradient of 19 mm Hg (absolute portal venous pressure 39 mm Hg) before shunt placement and 8 mm Hg (absolute portal venous pressure 33 mm Hg) after shunt placement and coil embolization. Ester Sides, MD [facility] Radiology Electronically Signed   By: Ester Sides M.D.   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 EXAM: 1. Ultrasound-guided right thoracentesis 2. Ultrasound-guided access of the right internal jugular vein 3. Ultrasound-guided access of the right common femoral vein 4. Intravascular ultrasound 5. Catheterization of the portal vein 6. Portal venous and central manometry 7. Portal venogram 8. Creation of a transhepatic portal vein to hepatic vein shunt 9. Coil embolization of left gastric vein. MEDICATIONS: As antibiotic prophylaxis, Rocephin  1 gm IV was  ordered pre-procedure and administered intravenously within one hour of incision. ANESTHESIA/SEDATION: General - as administered by the Anesthesia department CONTRAST:  One hundred twenty-five ML Omnipaque  300, intravenous FLUOROSCOPY TIME:  Seven hundred ninety-one mGy reference air kerma COMPLICATIONS: None immediate.  PROCEDURE: Informed written consent was obtained from the patient after a thorough discussion of the procedural risks, benefits and alternatives. All questions were addressed. Maximal Sterile Barrier Technique was utilized including caps, mask, sterile gowns, sterile gloves, sterile drape, hand hygiene and skin antiseptic. A timeout was performed prior to the initiation of the procedure. Preprocedure ultrasound evaluation of the right posterior chest demonstrated large pleural effusion. The procedure was planned. Subdermal Local anesthesia was administered at the planned needle entry site with 1% lidocaine . Deeper local anesthetic was administered under ultrasound visualization to the level of the pleura. A small skin nick was made. Under direct ultrasound visualization, a 6 French skater drain was introduced and trocar fashion into the right pleural space. The inner stylet was removed. A total of 2.8 L of translucent, straw-colored fluid was removed. The catheter was then removed. A sterile bandage was applied. A preliminary ultrasound of the right groin was performed and demonstrates a patent right common femoral vein and greater saphenous vein. A permanent ultrasound image was recorded. Using a combination of fluoroscopy and ultrasound, an access site was determined. A small dermatotomy was made at the planned puncture site. Using ultrasound guidance, access into the right greater saphenous vein was obtained with visualization of needle entry into the vessel using a standard micropuncture technique. A wire was advanced into the IVC insert all fascial dilation performed. An 8 French, 11 cm vascular sheath was placed into the external iliac vein. Through this access site, an 92 French Accunav ICE catheter was advanced with ease under fluoroscopic guidance to the level of the intrahepatic inferior vena cava. A preliminary ultrasound of the right neck was performed and demonstrates a patent internal jugular vein. A  permanent ultrasound image was recorded. Using a combination of fluoroscopy and ultrasound, an access site was determined. A small dermatotomy was made at the planned puncture site. Using ultrasound guidance, access into the right internal jugular vein was obtained with visualization of needle entry into the vessel using a standard micropuncture technique. A wire was advanced into the IVC and serial fascial dilation performed. A 10 French tips sheath was placed into the internal jugular vein and advanced to the IVC. The jugular sheath was retracted into the right atrium and manometry was performed. A 5 French angled tip catheter was then directed into the middle hepatic vein. The catheter was advanced to a wedge portion of the a patent vein over which the 10 French sheath was advanced into the middle hepatic vein. Using ICE ultrasound visualization the catheter as middle hepatic vein as well as the portal anatomy was defined. A planned exit site from the hepatic vein and puncture site from the portal vein was placed into a single sonographic plane. Under direct ultrasound visualization, the ScorpionX needle was advanced into the central right portal vein. Hand injection of contrast confirmed position within the portal system. A Glidewire Advantage was then advanced into the splenic vein. A 5 French marking pigtail catheter was then advanced over the wire into the main portal vein and wire removed. Portal venogram was performed which demonstrated a patent portal vein. A large left gastric vein with retrograde filling into multiple gastroesophageal varices was seen arising from the main portal vein. Portal manometry was then performed.  The tract was then dilated to 8 mm with an 8 mm x 8 cm Athletis balloon. A 8-10 mm by 7 + 2 cm of Viatorr endograft was placed. This was ultimately dilated to 8 mm. After placement of the shunt, right atrial and portal pressures were repeated. A C2 catheter was then directed into the  left gastric vein. Dedicated venogram demonstrated retrograde flow into large gastroesophageal varices. Coil embolization was then performed with an assortment of 0.035 detachable Azur coils. Completion portal venogram demonstrates a patent TIPS endograft without complete embolization of the left gastric vein. The catheters and sheath were removed and manual compression was applied to the right internal jugular and right common femoral venous access sites until hemostasis was achieved. The patient was transferred to the PACU in stable condition. Pre-TIPS Mean Pressures (mmHg): Right atrium: 20 Portal vein: 39 Portosystemic gradient: 19 Post-TIPS Mean Pressures (mmHg): Right atrium:24 Portal vein: 32 Portosystemic gradient: 8 IMPRESSION: 1. Successful transjugular portosystemic shunt creation. 2. Technically successful coil embolization of the left gastric vein. 3. Portosystemic gradient of 19 mm Hg (absolute portal venous pressure 39 mm Hg) before shunt placement and 8 mm Hg (absolute portal venous pressure 33 mm Hg) after shunt placement and coil embolization. Ester Sides, MD [facility] Radiology Electronically Signed   By: Ester Sides M.D.   On: 12/14/2023 21:37   DG Chest Port 1 View Result Date: 12/14/2023 CLINICAL DATA:  Status post thoracentesis. EXAM: PORTABLE CHEST 1 VIEW COMPARISON:  Radiographs 12/04/2023 FINDINGS: Moderate right pleural effusion is more layering than on prior exam and likely increased in volume. No pneumothorax. Grossly stable heart size and mediastinal contours. No significant left pleural effusion on AP view. Question vascular congestion but no edema. IMPRESSION: Moderate right pleural effusion is more layering than on prior exam and likely increased in volume. No pneumothorax. Electronically Signed   By: Andrea Gasman M.D.   On: 12/14/2023 16:08    Labs:  CBC: Recent Labs    01/31/23 0622 08/03/23 1626 12/14/23 0914  12/14/23 1423 12/15/23 0402  WBC 8.8 5.2 5.3  --  7.0  HGB 13.8 14.0 13.3 11.2* 11.2*  HCT 38.8* 40.7 37.8* 33.0* 32.4*  PLT 72* 63.0* 61*  --  41*    COAGS: Recent Labs    12/14/23 0914 12/15/23 0402  INR 1.3* 1.8*    BMP: Recent Labs    01/31/23 0622 03/08/23 1144 09/04/23 1325 10/19/23 1654 12/14/23 0914 12/14/23 1423 12/15/23 0402  NA 134*   < > 142 140 140 140 139  K 4.3   < > 3.9 4.7 3.6 3.7 3.9  CL 105   < > 104 103 105  --  101  CO2 22   < > 25 24 27   --  28  GLUCOSE 124*   < > 81 153* 107*  --  173*  BUN 20   < > 20 19 17   --  20  CALCIUM  8.4*   < > 9.1 8.7 8.1*  --  8.4*  CREATININE 1.11   < > 1.06 1.09 1.02  --  0.93  GFRNONAA >60  --   --   --  >60  --  >60   < > = values in this interval not displayed.    LIVER FUNCTION TESTS: Recent Labs    08/03/23 1626 10/19/23 1654 12/14/23 0914 12/15/23 0402  BILITOT 2.2* 1.6* 3.1* 2.6*  AST 36 48* 47* 84*  ALT 24 27 28  43  ALKPHOS 95 132* 87 73  PROT 6.4 6.2 6.2* 5.9*  ALBUMIN  3.1* 3.1* 2.4* 2.5*    Assessment and Plan: Portal Hypertension with recurrent right hydrothorax s/p TIPS yesterday with Dr. Jennefer Patient assessed at bedside.  Reports he is feeling well.  No complaints.  Eating and drinking well.  Family at bedside states he had trouble sleeping, but overall states patient is at baseline.   He appears to be doing well for discharge home today per the discretion of the medical team.  Needs to have foley out, IVs, get OOB, and preferably have a BM before leaving this afternoon.   If additional lactulose  dose needed this afternoon it can be ordered.   Otherwise, outpatient follow-up has been ordered.  This will be arranged with patient/family.   Electronically Signed: Josy Peaden Sue-Ellen Kosei Rhodes, PA 12/15/2023, 1:55 PM   I spent a total of 15 Minutes at the the patient's bedside AND on the patient's hospital floor or unit, greater than 50% of which was counseling/coordinating care for portal  hypertension.

## 2023-12-15 NOTE — Progress Notes (Addendum)
 NAME:  David Esparza, MRN:  [id number], DOB:  02-May-1950, LOS: 1 ADMISSION DATE:  12/14/2023, CONSULTATION DATE: 12/14/23 REFERRING MD:  Jennefer, CHIEF COMPLAINT:  Hypotension   History of Present Illness:  David Esparza is a 73 year old male with past medical history significant for cirrhosis and portal hypertension with esophageal varices from hepatic steatosis with recurrent hepatic hydrothorax he developed a left pleural effusion as well in 08/30/2023 and has undergone multiple thoracenteses.  In this setting he was recommended to pursue a TIPS procedure for recurrent hepatic hydrothorax.  He underwent this on 12/4 along with a right thoracentesis and left gastric vein embolization.  He was extubated without difficulty however remained hypotensive postop despite 1.5 L albumin .  He was started on Levophed  and PCCM consulted for admission.  Repeat chest x-ray did not show a pneumothorax, repeat labs are pending.  He did not receive sedating medications and is awake and alert and only minor discomfort.   Pertinent  Medical History  has a past medical history of Abnormal LFTs (09/06/2011), Acute bronchitis (08/16/2010), Anxiety (09/06/2011), Chicken pox (as a child), Cirrhosis (HCC), Cutaneous skin tags (07/27/2016), Diabetes mellitus without complication (HCC), Fatigue, Gout, Gout, Grief reaction (09/09/2012), Hyperglycemia (09/06/2011), Hyperlipidemia (09/06/2011), Hypertension, Insomnia (08/16/2010), Knee pain, acute (02/28/2012), Liver cirrhosis secondary to NASH (HCC), Low testosterone  (02/28/2012), Measles (as a child), Mumps (as a child), Nocturia (08/16/2010), Obese, Preventative health care (09/06/2011), Sleep apnea (09/06/2011), and Unspecified vitamin D  deficiency (02/28/2012).   Significant Hospital Events: Including procedures, antibiotic start and stop dates in addition to other pertinent events   12/4 TIPS with R thora, post-op hypotension admit to ICU   Interim History / Subjective:  No  acute event overnight, patient is doing well sats well on RA, off levo blood pressure stable.  Hb stable 11.2   Objective    Blood pressure 116/66, pulse 80, temperature 98 F (36.7 C), temperature source Oral, resp. rate 18, height 5' 7 (1.702 m), weight 104.3 kg, SpO2 92%.        Intake/Output Summary (Last 24 hours) at 12/15/2023 0736 Last data filed at 12/15/2023 0600 Gross per 24 hour  Intake 3947.51 ml  Output 1002.8 ml  Net 2944.71 ml   Filed Weights   12/14/23 0831  Weight: 104.3 kg    Examination: General: Well-nourished, resting in bed, not in distress HENT: NCAT Lungs: Equal chest wall movement, clear to auscultation Cardiovascular: S1-S2 normal, no murmur or regurg Abdomen: Soft nontender nondistended Extremities: Warm, edema 2+ Neuro: AO X3, conversational, intact mentation, motor and sensation GU: Deferred  Resolved problem list   Assessment and Plan   73 year old male with history of cirrhosis, PHTN with esophageal varices from hepatic steatosis, with recurrent hepatic hydrothorax and right pleural effusion. Patient is now s/p TIPS procedure for recurrent hepatic hydrothorax, and right thoracentesis, and left gastric vein embolization on 12/4. Postprocedure patient was requiring Levophed  and therefore transferred to the ICU for postop monitoring.   12/5: Patient is doing well, off levo, blood pressure stable, hemoglobin stable 11.2 Pain well-controlled    Portal hypertension, cirrhosis, esophageal varices Hepatic steatosis Recurrent hydrothorax Right pleural effusion Hypotension postprocedure    Plan - Patient is hemodynamically stable, Hb stable, tolerating diet, voiding without any issues. - Awaiting bowel movements postprocedure before he is discharged. - Readjusted bowel regimen - Will downgrade to medicine, waiting for bowel function / bowel movement before  discharge  -Ammonia labs ordered in the AM or sooner if develops confusion -  Continue holding blood  pressure meds, and can resume after seeing PCP in 3 days.    Labs   CBC: Recent Labs  Lab 12/14/23 0914 12/14/23 1423 12/15/23 0402  WBC 5.3  --  7.0  HGB 13.3 11.2* 11.2*  HCT 37.8* 33.0* 32.4*  MCV 100.3*  --  99.4  PLT 61*  --  41*    Basic Metabolic Panel: Recent Labs  Lab 12/14/23 0914 12/14/23 1423 12/15/23 0402  NA 140 140 139  K 3.6 3.7 3.9  CL 105  --  101  CO2 27  --  28  GLUCOSE 107*  --  173*  BUN 17  --  20  CREATININE 1.02  --  0.93  CALCIUM  8.1*  --  8.4*  MG  --   --  2.0  PHOS  --   --  3.6   GFR: Estimated Creatinine Clearance: 80.2 mL/min (by C-G formula based on SCr of 0.93 mg/dL). Recent Labs  Lab 12/14/23 0914 12/14/23 1821 12/15/23 0402  WBC 5.3  --  7.0  LATICACIDVEN  --  1.7  --     Liver Function Tests: Recent Labs  Lab 12/14/23 0914 12/15/23 0402  AST 47* 84*  ALT 28 43  ALKPHOS 87 73  BILITOT 3.1* 2.6*  PROT 6.2* 5.9*  ALBUMIN  2.4* 2.5*   No results for input(s): LIPASE, AMYLASE in the last 168 hours. No results for input(s): AMMONIA in the last 168 hours.  ABG    Component Value Date/Time   PHART 7.440 12/14/2023 1423   PCO2ART 38.2 12/14/2023 1423   PO2ART 85 12/14/2023 1423   HCO3 26.2 12/14/2023 1423   TCO2 27 12/14/2023 1423   O2SAT 97 12/14/2023 1423     Coagulation Profile: Recent Labs  Lab 12/14/23 0914 12/15/23 0402  INR 1.3* 1.8*    Cardiac Enzymes: No results for input(s): CKTOTAL, CKMB, CKMBINDEX, TROPONINI in the last 168 hours.  HbA1C: Hgb A1c MFr Bld  Date/Time Value Ref Range Status  12/14/2023 06:21 PM 4.9 4.8 - 5.6 % Final    Comment:    (NOTE) Diagnosis of Diabetes The following HbA1c ranges recommended by the American Diabetes Association (ADA) may be used as an aid in the diagnosis of diabetes mellitus.  Hemoglobin             Suggested A1C NGSP%              Diagnosis  <5.7                   Non Diabetic  5.7-6.4                 Pre-Diabetic  >6.4                   Diabetic  <7.0                   Glycemic control for                       adults with diabetes.    01/27/2023 09:00 AM 5.2 4.8 - 5.6 % Final    Comment:    (NOTE) Pre diabetes:          5.7%-6.4%  Diabetes:              >6.4%  Glycemic control for   <7.0% adults with diabetes     CBG: Recent Labs  Lab 12/14/23 0834 12/14/23 1027 12/14/23 1413 12/14/23  1515 12/14/23 2118  GLUCAP 102* 107* 105* 111* 195*    Review of Systems:   Negative except above  Past Medical History:  He,  has a past medical history of Abnormal LFTs (09/06/2011), Acute bronchitis (08/16/2010), Anxiety (09/06/2011), Chicken pox (as a child), Cirrhosis (HCC), Cutaneous skin tags (07/27/2016), Diabetes mellitus without complication (HCC), Fatigue, Gout, Gout, Grief reaction (09/09/2012), Hyperglycemia (09/06/2011), Hyperlipidemia (09/06/2011), Hypertension, Insomnia (08/16/2010), Knee pain, acute (02/28/2012), Liver cirrhosis secondary to NASH (HCC), Low testosterone  (02/28/2012), Measles (as a child), Mumps (as a child), Nocturia (08/16/2010), Obese, Preventative health care (09/06/2011), Sleep apnea (09/06/2011), and Unspecified vitamin D  deficiency (02/28/2012).   Surgical History:   Past Surgical History:  Procedure Laterality Date   BACK SURGERY     COLONOSCOPY  09/16/2010   Dr. Debrah: moderate diverticulosis sigmoid and descending colon, o/w normal: repeat 10 yrs.   intestines sewn  73 yrs old   shot once made 6 holes   IR ANGIOGRAM SELECTIVE EACH ADDITIONAL VESSEL  12/14/2023   IR EMBO ARTERIAL NOT HEMORR HEMANG INC GUIDE ROADMAPPING  12/14/2023   IR RADIOLOGIST EVAL & MGMT  11/15/2023   IR THORACENTESIS ASP PLEURAL SPACE W/IMG GUIDE  12/04/2023   IR THORACENTESIS ASP PLEURAL SPACE W/IMG GUIDE  12/14/2023   IR TIPS  12/14/2023   IR US  GUIDE VASC ACCESS RIGHT  12/14/2023   IR US  GUIDE VASC ACCESS RIGHT  12/14/2023   TONSILLECTOMY  as a child   TONSILLECTOMY      UMBILICAL HERNIA REPAIR N/A 01/30/2023   Procedure: OPEN UMBILICAL HERNIA REPAIR;  Surgeon: Belinda Cough, MD;  Location: [facility] OR;  Service: General;  Laterality: N/A;   VASECTOMY  73 yrs old     Social History:   reports that he quit smoking about 55 years ago. His smoking use included cigarettes. He has never used smokeless tobacco. He reports that he does not currently use alcohol. He reports that he does not use drugs.   Family History:  His family history includes Cancer in his maternal grandmother; Depression in his daughter; Diabetes in his maternal grandfather and mother; Esophageal cancer in his maternal grandmother; Yvone' disease in his daughter; Heart disease in his maternal grandfather. There is no history of Colon polyps, Colon cancer, Rectal cancer, or Stomach cancer.   Allergies No Known Allergies   Home Medications  Prior to Admission medications   Medication Sig Start Date End Date Taking? Authorizing Provider  carvedilol  (COREG ) 12.5 MG tablet Take 1 tablet (12.5 mg total) by mouth daily. Patient needs follow up appointment for future refills. Please call [phone] to schedule an appointment. 10/06/23  Yes Cirigliano, Vito V, DO  furosemide  (LASIX ) 40 MG tablet Take 2 tablets in the morning and 1 tablet in the afternoon. 11/13/23  Yes Alghanim, Fahid, MD  lisinopril  (ZESTRIL ) 2.5 MG tablet TAKE 1 TABLET (2.5 MG TOTAL) BY MOUTH DAILY. 11/06/23  Yes Daryl Setter, NP  albuterol  (VENTOLIN  HFA) 108 (90 Base) MCG/ACT inhaler Inhale 2 puffs into the lungs every 6 (six) hours as needed for wheezing or shortness of breath. Patient not taking: Reported on 12/11/2023 08/22/23   Frann Mabel Mt, DO     Critical care time: 30 mins

## 2023-12-16 ENCOUNTER — Other Ambulatory Visit (HOSPITAL_COMMUNITY): Payer: Self-pay

## 2023-12-16 DIAGNOSIS — I959 Hypotension, unspecified: Secondary | ICD-10-CM

## 2023-12-16 LAB — CBC
HCT: 34.2 % — ABNORMAL LOW (ref 39.0–52.0)
Hemoglobin: 12 g/dL — ABNORMAL LOW (ref 13.0–17.0)
MCH: 34.7 pg — ABNORMAL HIGH (ref 26.0–34.0)
MCHC: 35.1 g/dL (ref 30.0–36.0)
MCV: 98.8 fL (ref 80.0–100.0)
Platelets: 37 K/uL — ABNORMAL LOW (ref 150–400)
RBC: 3.46 MIL/uL — ABNORMAL LOW (ref 4.22–5.81)
RDW: 14.2 % (ref 11.5–15.5)
WBC: 7.4 K/uL (ref 4.0–10.5)
nRBC: 0 % (ref 0.0–0.2)

## 2023-12-16 LAB — BASIC METABOLIC PANEL WITH GFR
Anion gap: 10 (ref 5–15)
BUN: 17 mg/dL (ref 8–23)
CO2: 22 mmol/L (ref 22–32)
Calcium: 8.1 mg/dL — ABNORMAL LOW (ref 8.9–10.3)
Chloride: 105 mmol/L (ref 98–111)
Creatinine, Ser: 1 mg/dL (ref 0.61–1.24)
GFR, Estimated: >60 mL/min (ref >60)
Glucose, Bld: 123 mg/dL — ABNORMAL HIGH (ref 70–99)
Potassium: 4 mmol/L (ref 3.5–5.1)
Sodium: 137 mmol/L (ref 135–145)

## 2023-12-16 LAB — AMMONIA: Ammonia: 52 umol/L — ABNORMAL HIGH (ref 9–35)

## 2023-12-16 LAB — GLUCOSE, CAPILLARY
Glucose-Capillary: 181 mg/dL — ABNORMAL HIGH (ref 70–99)
Glucose-Capillary: 109 mg/dL — ABNORMAL HIGH (ref 70–99)

## 2023-12-16 MED ORDER — SPIRONOLACTONE 50 MG PO TABS
50.0000 mg | ORAL_TABLET | Freq: Every day | ORAL | 0 refills | Status: DC
  Filled 2023-12-16: qty 90, 90d supply, fill #0

## 2023-12-16 MED ORDER — LACTULOSE 10 GM/15ML PO SOLN: 20.0000 g | Freq: Three times a day (TID) | ORAL | Status: DC

## 2023-12-16 MED ORDER — LACTULOSE 10 GM/15ML PO SOLN
ORAL | 0 refills | Status: DC
  Filled 2023-12-16: qty 2838, 30d supply, fill #0

## 2023-12-16 MED ORDER — FUROSEMIDE 40 MG PO TABS
40.0000 mg | ORAL_TABLET | Freq: Every day | ORAL | 0 refills | Status: DC
  Filled 2023-12-16: qty 90, 90d supply, fill #0

## 2023-12-16 NOTE — Discharge Summary (Signed)
 Physician Discharge Summary  David Esparza FMW:[id number] DOB: 01-Jun-1950 DOA: 12/14/2023  PCP: Domenica Harlene LABOR, MD  Admit date: 12/14/2023 Discharge date: 12/16/23  Admitted From: Home Disposition: Home Recommendations for Outpatient Follow-up:  Outpatient follow-up with PCP in 1 to 2 weeks IR to arrange outpatient follow-up Check CMP and CBC at follow-up Please follow up on the following pending results: None  Home Health: No need identified Equipment/Devices: No need identified  Discharge Condition: Stable CODE STATUS: Full code   Follow-up Information     Domenica Harlene LABOR, MD. Schedule an appointment as soon as possible for a visit in 1 week(s).   Specialty: Family Medicine Contact information: [address] [place] [address] [phone]                 Hospital course 73 year old M with PMH of liver cirrhosis with portal hypertension, esophageal varices and recurrent hepatic hydrothorax for which he has had multiple thoracocentesis.  Patient has had TIPS procedure on 12/4 along with right thoracocentesis and left gastric vein embolization.  He was extubated without difficulty.  However, he remained hypotensive postoperatively despite 1.5 L albumin .  He was started on Levophed  and admitted to ICU.  Repeat chest x-ray did not show pneumothorax.   Patient's hypotension resolved in ICU.  He came off vasopressor the next day.  Hemoglobin remained stable.  He was transferred to regular floor on 12/16/2023.  Patient remained hemodynamically stable on regular floor.  Hemoglobin remained stable as well.  Has had multiple bowel movements with lactulose .  Feels well and ready to go home.  We have discontinued his home Coreg  and lisinopril .  Decreased home Lasix  and added low-dose Aldactone .  He will be discharged on lactulose .  Encouraged to titrate for goal of 2-3 bowel movements a day.  Outpatient follow-up with PCP and IR as above.  See individual  problem list below for more.   Problems addressed during this hospitalization Principal Problem:   S/P TIPS (transjugular intrahepatic portosystemic shunt) Active Problems:   Hypotension   Class II obesity Body mass index is 36.02 kg/m.           Consultations: Interventional radiology Critical care  Time spent 35  minutes  Vital signs Vitals:   12/15/23 1600 12/15/23 1700 12/15/23 2005 12/16/23 0922  BP: 114/69 (!) 99/59 113/62 112/80  Pulse: 76 71 78 82  Temp:    (!) 97.4 F (36.3 C)  Resp: 11 10  18   Height:      Weight:      SpO2: 93% 92% 98% 100%  TempSrc:    Oral  BMI (Calculated):         Discharge exam  GENERAL: No apparent distress.  Nontoxic. HEENT: MMM.  Vision and hearing grossly intact.  NECK: Supple.  No apparent JVD.  RESP:  No IWOB.  Fair aeration bilaterally. CVS:  RRR. Heart sounds normal.  ABD/GI/GU: BS+. Abd soft, NTND.  MSK/EXT:  Moves extremities. No apparent deformity. No edema.  SKIN: no apparent skin lesion or wound NEURO: Awake and alert. Oriented appropriately.  No apparent focal neuro deficit. PSYCH: Calm. Normal affect.   Discharge Instructions Discharge Instructions     Diet - low sodium heart healthy   Complete by: As directed    Discharge instructions   Complete by: As directed    It has been a pleasure taking care of you!  You were hospitalized due to recurrent ascites for which you have been  treated.  We have started you on lactulose .  It is very important that you take the lactulose  as prescribed for a goal of 2-3 bowel movements a day.  We have also made adjustment to your home medication during this hospitalization.  Please review your new medication list and the directions on your medications before you take them.  Limit your sodium intake to less than 2000 mg (2 g) a day.  Follow-up with your primary care doctor in 1 to 2 weeks or sooner if needed.  Follow-up with interventional radiology per their  recommendation.   Take care,   Increase activity slowly   Complete by: As directed    No wound care   Complete by: As directed       Allergies as of 12/16/2023   No Known Allergies      Medication List     STOP taking these medications    carvedilol  12.5 MG tablet Commonly known as: COREG    lisinopril  2.5 MG tablet Commonly known as: ZESTRIL        TAKE these medications    albuterol  108 (90 Base) MCG/ACT inhaler Commonly known as: VENTOLIN  HFA Inhale 2 puffs into the lungs every 6 (six) hours as needed for wheezing or shortness of breath.   Constulose  10 GM/15ML solution Generic drug: lactulose  Take 20 gm (30 ml) two to three times a day for a goal of 2-3 bowel movements a day   furosemide  40 MG tablet Commonly known as: Lasix  Take 1 tablet (40 mg total) by mouth daily. Start taking on: December 17, 2023 What changed:  how much to take how to take this when to take this additional instructions   spironolactone  50 MG tablet Commonly known as: Aldactone  Take 1 tablet (50 mg total) by mouth daily.         Procedures/Studies:   DG CHEST PORT 1 VIEW Result Date: 12/15/2023 CLINICAL DATA:  Respiratory failure EXAM: PORTABLE CHEST 1 VIEW COMPARISON:  Prior chest x-ray 12/14/2023 FINDINGS: Cardiac and mediastinal contours remain unchanged. Moderate to large layering right pleural effusion with associated atelectasis. The left lung is relatively clear. No new airspace opacification. Coil mass in the mid epigastric region present consistent with recent coil embolization of varices. IMPRESSION: Moderate to large layering right pleural effusion with associated right lower and middle lobe atelectasis. Electronically Signed   By: Wilkie Lent M.D.   On: 12/15/2023 11:49   IR Tips Result Date: 12/14/2023 CLINICAL DATA:  73 year old male with history of decompensated NASH cirrhosis with recurrent right hepatic hydrothorax and large gastroesophageal varices. EXAM:  1. Ultrasound-guided right thoracentesis 2. Ultrasound-guided access of the right internal jugular vein 3. Ultrasound-guided access of the right common femoral vein 4. Intravascular ultrasound 5. Catheterization of the portal vein 6. Portal venous and central manometry 7. Portal venogram 8. Creation of a transhepatic portal vein to hepatic vein shunt 9. Coil embolization of left gastric vein. MEDICATIONS: As antibiotic prophylaxis, Rocephin  1 gm IV was ordered pre-procedure and administered intravenously within one hour of incision. ANESTHESIA/SEDATION: General - as administered by the Anesthesia department CONTRAST:  One hundred twenty-five ML Omnipaque  300, intravenous FLUOROSCOPY TIME:  Seven hundred ninety-one mGy reference air kerma COMPLICATIONS: None immediate. PROCEDURE: Informed written consent was obtained from the patient after a thorough discussion of the procedural risks, benefits and alternatives. All questions were addressed. Maximal Sterile Barrier Technique was utilized including caps, mask, sterile gowns, sterile gloves, sterile drape, hand hygiene and skin antiseptic. A timeout was performed prior  to the initiation of the procedure. Preprocedure ultrasound evaluation of the right posterior chest demonstrated large pleural effusion. The procedure was planned. Subdermal Local anesthesia was administered at the planned needle entry site with 1% lidocaine . Deeper local anesthetic was administered under ultrasound visualization to the level of the pleura. A small skin nick was made. Under direct ultrasound visualization, a 6 French skater drain was introduced and trocar fashion into the right pleural space. The inner stylet was removed. A total of 2.8 L of translucent, straw-colored fluid was removed. The catheter was then removed. A sterile bandage was applied. A preliminary ultrasound of the right groin was performed and demonstrates a patent right common femoral vein and greater saphenous vein. A  permanent ultrasound image was recorded. Using a combination of fluoroscopy and ultrasound, an access site was determined. A small dermatotomy was made at the planned puncture site. Using ultrasound guidance, access into the right greater saphenous vein was obtained with visualization of needle entry into the vessel using a standard micropuncture technique. A wire was advanced into the IVC insert all fascial dilation performed. An 8 French, 11 cm vascular sheath was placed into the external iliac vein. Through this access site, an 77 French Accunav ICE catheter was advanced with ease under fluoroscopic guidance to the level of the intrahepatic inferior vena cava. A preliminary ultrasound of the right neck was performed and demonstrates a patent internal jugular vein. A permanent ultrasound image was recorded. Using a combination of fluoroscopy and ultrasound, an access site was determined. A small dermatotomy was made at the planned puncture site. Using ultrasound guidance, access into the right internal jugular vein was obtained with visualization of needle entry into the vessel using a standard micropuncture technique. A wire was advanced into the IVC and serial fascial dilation performed. A 10 French tips sheath was placed into the internal jugular vein and advanced to the IVC. The jugular sheath was retracted into the right atrium and manometry was performed. A 5 French angled tip catheter was then directed into the middle hepatic vein. The catheter was advanced to a wedge portion of the a patent vein over which the 10 French sheath was advanced into the middle hepatic vein. Using ICE ultrasound visualization the catheter as middle hepatic vein as well as the portal anatomy was defined. A planned exit site from the hepatic vein and puncture site from the portal vein was placed into a single sonographic plane. Under direct ultrasound visualization, the ScorpionX needle was advanced into the central right portal  vein. Hand injection of contrast confirmed position within the portal system. A Glidewire Advantage was then advanced into the splenic vein. A 5 French marking pigtail catheter was then advanced over the wire into the main portal vein and wire removed. Portal venogram was performed which demonstrated a patent portal vein. A large left gastric vein with retrograde filling into multiple gastroesophageal varices was seen arising from the main portal vein. Portal manometry was then performed. The tract was then dilated to 8 mm with an 8 mm x 8 cm Athletis balloon. A 8-10 mm by 7 + 2 cm of Viatorr endograft was placed. This was ultimately dilated to 8 mm. After placement of the shunt, right atrial and portal pressures were repeated. A C2 catheter was then directed into the left gastric vein. Dedicated venogram demonstrated retrograde flow into large gastroesophageal varices. Coil embolization was then performed with an assortment of 0.035 detachable Azur coils. Completion portal venogram demonstrates a patent TIPS endograft  without complete embolization of the left gastric vein. The catheters and sheath were removed and manual compression was applied to the right internal jugular and right common femoral venous access sites until hemostasis was achieved. The patient was transferred to the PACU in stable condition. Pre-TIPS Mean Pressures (mmHg): Right atrium: 20 Portal vein: 39 Portosystemic gradient: 19 Post-TIPS Mean Pressures (mmHg): Right atrium:24 Portal vein: 32 Portosystemic gradient: 8 IMPRESSION: 1. Successful transjugular portosystemic shunt creation. 2. Technically successful coil embolization of the left gastric vein. 3. Portosystemic gradient of 19 mm Hg (absolute portal venous pressure 39 mm Hg) before shunt placement and 8 mm Hg (absolute portal venous pressure 33 mm Hg) after shunt placement and coil embolization. Ester Sides, MD [facility] Radiology  Electronically Signed   By: Ester Sides M.D.   On: 12/14/2023 21:37   IR US  Guide Vasc Access Right Result Date: 12/14/2023 CLINICAL DATA:  73 year old male with history of decompensated NASH cirrhosis with recurrent right hepatic hydrothorax and large gastroesophageal varices. EXAM: 1. Ultrasound-guided right thoracentesis 2. Ultrasound-guided access of the right internal jugular vein 3. Ultrasound-guided access of the right common femoral vein 4. Intravascular ultrasound 5. Catheterization of the portal vein 6. Portal venous and central manometry 7. Portal venogram 8. Creation of a transhepatic portal vein to hepatic vein shunt 9. Coil embolization of left gastric vein. MEDICATIONS: As antibiotic prophylaxis, Rocephin  1 gm IV was ordered pre-procedure and administered intravenously within one hour of incision. ANESTHESIA/SEDATION: General - as administered by the Anesthesia department CONTRAST:  One hundred twenty-five ML Omnipaque  300, intravenous FLUOROSCOPY TIME:  Seven hundred ninety-one mGy reference air kerma COMPLICATIONS: None immediate. PROCEDURE: Informed written consent was obtained from the patient after a thorough discussion of the procedural risks, benefits and alternatives. All questions were addressed. Maximal Sterile Barrier Technique was utilized including caps, mask, sterile gowns, sterile gloves, sterile drape, hand hygiene and skin antiseptic. A timeout was performed prior to the initiation of the procedure. Preprocedure ultrasound evaluation of the right posterior chest demonstrated large pleural effusion. The procedure was planned. Subdermal Local anesthesia was administered at the planned needle entry site with 1% lidocaine . Deeper local anesthetic was administered under ultrasound visualization to the level of the pleura. A small skin nick was made. Under direct ultrasound visualization, a 6 French skater drain was introduced and trocar fashion into the right pleural space. The inner  stylet was removed. A total of 2.8 L of translucent, straw-colored fluid was removed. The catheter was then removed. A sterile bandage was applied. A preliminary ultrasound of the right groin was performed and demonstrates a patent right common femoral vein and greater saphenous vein. A permanent ultrasound image was recorded. Using a combination of fluoroscopy and ultrasound, an access site was determined. A small dermatotomy was made at the planned puncture site. Using ultrasound guidance, access into the right greater saphenous vein was obtained with visualization of needle entry into the vessel using a standard micropuncture technique. A wire was advanced into the IVC insert all fascial dilation performed. An 8 French, 11 cm vascular sheath was placed into the external iliac vein. Through this access site, an 95 French Accunav ICE catheter was advanced with ease under fluoroscopic guidance to the level of the intrahepatic inferior vena cava. A preliminary ultrasound of the right neck was performed and demonstrates a patent internal jugular vein. A permanent ultrasound image was recorded. Using a combination of fluoroscopy and ultrasound, an access site was determined. A small dermatotomy  was made at the planned puncture site. Using ultrasound guidance, access into the right internal jugular vein was obtained with visualization of needle entry into the vessel using a standard micropuncture technique. A wire was advanced into the IVC and serial fascial dilation performed. A 10 French tips sheath was placed into the internal jugular vein and advanced to the IVC. The jugular sheath was retracted into the right atrium and manometry was performed. A 5 French angled tip catheter was then directed into the middle hepatic vein. The catheter was advanced to a wedge portion of the a patent vein over which the 10 French sheath was advanced into the middle hepatic vein. Using ICE ultrasound visualization the catheter as  middle hepatic vein as well as the portal anatomy was defined. A planned exit site from the hepatic vein and puncture site from the portal vein was placed into a single sonographic plane. Under direct ultrasound visualization, the ScorpionX needle was advanced into the central right portal vein. Hand injection of contrast confirmed position within the portal system. A Glidewire Advantage was then advanced into the splenic vein. A 5 French marking pigtail catheter was then advanced over the wire into the main portal vein and wire removed. Portal venogram was performed which demonstrated a patent portal vein. A large left gastric vein with retrograde filling into multiple gastroesophageal varices was seen arising from the main portal vein. Portal manometry was then performed. The tract was then dilated to 8 mm with an 8 mm x 8 cm Athletis balloon. A 8-10 mm by 7 + 2 cm of Viatorr endograft was placed. This was ultimately dilated to 8 mm. After placement of the shunt, right atrial and portal pressures were repeated. A C2 catheter was then directed into the left gastric vein. Dedicated venogram demonstrated retrograde flow into large gastroesophageal varices. Coil embolization was then performed with an assortment of 0.035 detachable Azur coils. Completion portal venogram demonstrates a patent TIPS endograft without complete embolization of the left gastric vein. The catheters and sheath were removed and manual compression was applied to the right internal jugular and right common femoral venous access sites until hemostasis was achieved. The patient was transferred to the PACU in stable condition. Pre-TIPS Mean Pressures (mmHg): Right atrium: 20 Portal vein: 39 Portosystemic gradient: 19 Post-TIPS Mean Pressures (mmHg): Right atrium:24 Portal vein: 32 Portosystemic gradient: 8 IMPRESSION: 1. Successful transjugular portosystemic shunt creation. 2. Technically successful coil embolization of the left gastric vein. 3.  Portosystemic gradient of 19 mm Hg (absolute portal venous pressure 39 mm Hg) before shunt placement and 8 mm Hg (absolute portal venous pressure 33 mm Hg) after shunt placement and coil embolization. Ester Sides, MD [facility] Radiology Electronically Signed   By: Ester Sides M.D.   On: 12/14/2023 21:37   IR US  Guide Vasc Access Right Result Date: 12/14/2023 CLINICAL DATA:  73 year old male with history of decompensated NASH cirrhosis with recurrent right hepatic hydrothorax and large gastroesophageal varices. EXAM: 1. Ultrasound-guided right thoracentesis 2. Ultrasound-guided access of the right internal jugular vein 3. Ultrasound-guided access of the right common femoral vein 4. Intravascular ultrasound 5. Catheterization of the portal vein 6. Portal venous and central manometry 7. Portal venogram 8. Creation of a transhepatic portal vein to hepatic vein shunt 9. Coil embolization of left gastric vein. MEDICATIONS: As antibiotic prophylaxis, Rocephin  1 gm IV was ordered pre-procedure and administered intravenously within one hour of incision. ANESTHESIA/SEDATION: General - as administered by the Anesthesia department CONTRAST:  One hundred twenty-five ML Omnipaque  300, intravenous FLUOROSCOPY TIME:  Seven hundred ninety-one mGy reference air kerma COMPLICATIONS: None immediate. PROCEDURE: Informed written consent was obtained from the patient after a thorough discussion of the procedural risks, benefits and alternatives. All questions were addressed. Maximal Sterile Barrier Technique was utilized including caps, mask, sterile gowns, sterile gloves, sterile drape, hand hygiene and skin antiseptic. A timeout was performed prior to the initiation of the procedure. Preprocedure ultrasound evaluation of the right posterior chest demonstrated large pleural effusion. The procedure was planned. Subdermal Local anesthesia was administered at the planned needle entry  site with 1% lidocaine . Deeper local anesthetic was administered under ultrasound visualization to the level of the pleura. A small skin nick was made. Under direct ultrasound visualization, a 6 French skater drain was introduced and trocar fashion into the right pleural space. The inner stylet was removed. A total of 2.8 L of translucent, straw-colored fluid was removed. The catheter was then removed. A sterile bandage was applied. A preliminary ultrasound of the right groin was performed and demonstrates a patent right common femoral vein and greater saphenous vein. A permanent ultrasound image was recorded. Using a combination of fluoroscopy and ultrasound, an access site was determined. A small dermatotomy was made at the planned puncture site. Using ultrasound guidance, access into the right greater saphenous vein was obtained with visualization of needle entry into the vessel using a standard micropuncture technique. A wire was advanced into the IVC insert all fascial dilation performed. An 8 French, 11 cm vascular sheath was placed into the external iliac vein. Through this access site, an 69 French Accunav ICE catheter was advanced with ease under fluoroscopic guidance to the level of the intrahepatic inferior vena cava. A preliminary ultrasound of the right neck was performed and demonstrates a patent internal jugular vein. A permanent ultrasound image was recorded. Using a combination of fluoroscopy and ultrasound, an access site was determined. A small dermatotomy was made at the planned puncture site. Using ultrasound guidance, access into the right internal jugular vein was obtained with visualization of needle entry into the vessel using a standard micropuncture technique. A wire was advanced into the IVC and serial fascial dilation performed. A 10 French tips sheath was placed into the internal jugular vein and advanced to the IVC. The jugular sheath was retracted into the right atrium and manometry  was performed. A 5 French angled tip catheter was then directed into the middle hepatic vein. The catheter was advanced to a wedge portion of the a patent vein over which the 10 French sheath was advanced into the middle hepatic vein. Using ICE ultrasound visualization the catheter as middle hepatic vein as well as the portal anatomy was defined. A planned exit site from the hepatic vein and puncture site from the portal vein was placed into a single sonographic plane. Under direct ultrasound visualization, the ScorpionX needle was advanced into the central right portal vein. Hand injection of contrast confirmed position within the portal system. A Glidewire Advantage was then advanced into the splenic vein. A 5 French marking pigtail catheter was then advanced over the wire into the main portal vein and wire removed. Portal venogram was performed which demonstrated a patent portal vein. A large left gastric vein with retrograde filling into multiple gastroesophageal varices was seen arising from the main portal vein. Portal manometry was then performed. The tract was then dilated to 8 mm with an 8 mm x 8 cm Athletis balloon. A 8-10 mm by  7 + 2 cm of Viatorr endograft was placed. This was ultimately dilated to 8 mm. After placement of the shunt, right atrial and portal pressures were repeated. A C2 catheter was then directed into the left gastric vein. Dedicated venogram demonstrated retrograde flow into large gastroesophageal varices. Coil embolization was then performed with an assortment of 0.035 detachable Azur coils. Completion portal venogram demonstrates a patent TIPS endograft without complete embolization of the left gastric vein. The catheters and sheath were removed and manual compression was applied to the right internal jugular and right common femoral venous access sites until hemostasis was achieved. The patient was transferred to the PACU in stable condition. Pre-TIPS Mean Pressures (mmHg): Right  atrium: 20 Portal vein: 39 Portosystemic gradient: 19 Post-TIPS Mean Pressures (mmHg): Right atrium:24 Portal vein: 32 Portosystemic gradient: 8 IMPRESSION: 1. Successful transjugular portosystemic shunt creation. 2. Technically successful coil embolization of the left gastric vein. 3. Portosystemic gradient of 19 mm Hg (absolute portal venous pressure 39 mm Hg) before shunt placement and 8 mm Hg (absolute portal venous pressure 33 mm Hg) after shunt placement and coil embolization. Ester Sides, MD [facility] Radiology Electronically Signed   By: Ester Sides M.D.   On: 12/14/2023 21:37   IR THORACENTESIS ASP PLEURAL SPACE W/IMG GUIDE Result Date: 12/14/2023 CLINICAL DATA:  73 year old male with history of decompensated NASH cirrhosis with recurrent right hepatic hydrothorax and large gastroesophageal varices. EXAM: 1. Ultrasound-guided right thoracentesis 2. Ultrasound-guided access of the right internal jugular vein 3. Ultrasound-guided access of the right common femoral vein 4. Intravascular ultrasound 5. Catheterization of the portal vein 6. Portal venous and central manometry 7. Portal venogram 8. Creation of a transhepatic portal vein to hepatic vein shunt 9. Coil embolization of left gastric vein. MEDICATIONS: As antibiotic prophylaxis, Rocephin  1 gm IV was ordered pre-procedure and administered intravenously within one hour of incision. ANESTHESIA/SEDATION: General - as administered by the Anesthesia department CONTRAST:  One hundred twenty-five ML Omnipaque  300, intravenous FLUOROSCOPY TIME:  Seven hundred ninety-one mGy reference air kerma COMPLICATIONS: None immediate. PROCEDURE: Informed written consent was obtained from the patient after a thorough discussion of the procedural risks, benefits and alternatives. All questions were addressed. Maximal Sterile Barrier Technique was utilized including caps, mask, sterile gowns, sterile gloves, sterile  drape, hand hygiene and skin antiseptic. A timeout was performed prior to the initiation of the procedure. Preprocedure ultrasound evaluation of the right posterior chest demonstrated large pleural effusion. The procedure was planned. Subdermal Local anesthesia was administered at the planned needle entry site with 1% lidocaine . Deeper local anesthetic was administered under ultrasound visualization to the level of the pleura. A small skin nick was made. Under direct ultrasound visualization, a 6 French skater drain was introduced and trocar fashion into the right pleural space. The inner stylet was removed. A total of 2.8 L of translucent, straw-colored fluid was removed. The catheter was then removed. A sterile bandage was applied. A preliminary ultrasound of the right groin was performed and demonstrates a patent right common femoral vein and greater saphenous vein. A permanent ultrasound image was recorded. Using a combination of fluoroscopy and ultrasound, an access site was determined. A small dermatotomy was made at the planned puncture site. Using ultrasound guidance, access into the right greater saphenous vein was obtained with visualization of needle entry into the vessel using a standard micropuncture technique. A wire was advanced into the IVC insert all fascial dilation performed. An 8 French, 11 cm vascular sheath  was placed into the external iliac vein. Through this access site, an 2 French Accunav ICE catheter was advanced with ease under fluoroscopic guidance to the level of the intrahepatic inferior vena cava. A preliminary ultrasound of the right neck was performed and demonstrates a patent internal jugular vein. A permanent ultrasound image was recorded. Using a combination of fluoroscopy and ultrasound, an access site was determined. A small dermatotomy was made at the planned puncture site. Using ultrasound guidance, access into the right internal jugular vein was obtained with visualization  of needle entry into the vessel using a standard micropuncture technique. A wire was advanced into the IVC and serial fascial dilation performed. A 10 French tips sheath was placed into the internal jugular vein and advanced to the IVC. The jugular sheath was retracted into the right atrium and manometry was performed. A 5 French angled tip catheter was then directed into the middle hepatic vein. The catheter was advanced to a wedge portion of the a patent vein over which the 10 French sheath was advanced into the middle hepatic vein. Using ICE ultrasound visualization the catheter as middle hepatic vein as well as the portal anatomy was defined. A planned exit site from the hepatic vein and puncture site from the portal vein was placed into a single sonographic plane. Under direct ultrasound visualization, the ScorpionX needle was advanced into the central right portal vein. Hand injection of contrast confirmed position within the portal system. A Glidewire Advantage was then advanced into the splenic vein. A 5 French marking pigtail catheter was then advanced over the wire into the main portal vein and wire removed. Portal venogram was performed which demonstrated a patent portal vein. A large left gastric vein with retrograde filling into multiple gastroesophageal varices was seen arising from the main portal vein. Portal manometry was then performed. The tract was then dilated to 8 mm with an 8 mm x 8 cm Athletis balloon. A 8-10 mm by 7 + 2 cm of Viatorr endograft was placed. This was ultimately dilated to 8 mm. After placement of the shunt, right atrial and portal pressures were repeated. A C2 catheter was then directed into the left gastric vein. Dedicated venogram demonstrated retrograde flow into large gastroesophageal varices. Coil embolization was then performed with an assortment of 0.035 detachable Azur coils. Completion portal venogram demonstrates a patent TIPS endograft without complete embolization  of the left gastric vein. The catheters and sheath were removed and manual compression was applied to the right internal jugular and right common femoral venous access sites until hemostasis was achieved. The patient was transferred to the PACU in stable condition. Pre-TIPS Mean Pressures (mmHg): Right atrium: 20 Portal vein: 39 Portosystemic gradient: 19 Post-TIPS Mean Pressures (mmHg): Right atrium:24 Portal vein: 32 Portosystemic gradient: 8 IMPRESSION: 1. Successful transjugular portosystemic shunt creation. 2. Technically successful coil embolization of the left gastric vein. 3. Portosystemic gradient of 19 mm Hg (absolute portal venous pressure 39 mm Hg) before shunt placement and 8 mm Hg (absolute portal venous pressure 33 mm Hg) after shunt placement and coil embolization. Ester Sides, MD [facility] Radiology Electronically Signed   By: Ester Sides M.D.   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 EXAM: 1. Ultrasound-guided right thoracentesis 2. Ultrasound-guided access of the right internal jugular vein 3.  Ultrasound-guided access of the right common femoral vein 4. Intravascular ultrasound 5. Catheterization of the portal vein 6. Portal venous and central manometry 7. Portal venogram 8. Creation of a transhepatic portal vein to hepatic vein shunt 9. Coil embolization of left gastric vein. MEDICATIONS: As antibiotic prophylaxis, Rocephin  1 gm IV was ordered pre-procedure and administered intravenously within one hour of incision. ANESTHESIA/SEDATION: General - as administered by the Anesthesia department CONTRAST:  One hundred twenty-five ML Omnipaque  300, intravenous FLUOROSCOPY TIME:  Seven hundred ninety-one mGy reference air kerma  COMPLICATIONS: None immediate. PROCEDURE: Informed written consent was obtained from the patient after a thorough discussion of the procedural risks, benefits and alternatives. All questions were addressed. Maximal Sterile Barrier Technique was utilized including caps, mask, sterile gowns, sterile gloves, sterile drape, hand hygiene and skin antiseptic. A timeout was performed prior to the initiation of the procedure. Preprocedure ultrasound evaluation of the right posterior chest demonstrated large pleural effusion. The procedure was planned. Subdermal Local anesthesia was administered at the planned needle entry site with 1% lidocaine . Deeper local anesthetic was administered under ultrasound visualization to the level of the pleura. A small skin nick was made. Under direct ultrasound visualization, a 6 French skater drain was introduced and trocar fashion into the right pleural space. The inner stylet was removed. A total of 2.8 L of translucent, straw-colored fluid was removed. The catheter was then removed. A sterile bandage was applied. A preliminary ultrasound of the right groin was performed and demonstrates a patent right common femoral vein and greater saphenous vein. A permanent ultrasound image was recorded. Using a combination of fluoroscopy and ultrasound, an access site was determined. A small dermatotomy was made at the planned puncture site. Using ultrasound guidance, access into the right greater saphenous vein was obtained with visualization of needle entry into the vessel using a standard micropuncture technique. A wire was advanced into the IVC insert all fascial dilation performed. An 8 French, 11 cm vascular sheath was placed into the external iliac vein. Through this access site, an 18 French Accunav ICE catheter was advanced with ease under fluoroscopic guidance to the level of the intrahepatic inferior vena cava. A preliminary ultrasound of the right neck was performed and demonstrates a  patent internal jugular vein. A permanent ultrasound image was recorded. Using a combination of fluoroscopy and ultrasound, an access site was determined. A small dermatotomy was made at the planned puncture site. Using ultrasound guidance, access into the right internal jugular vein was obtained with visualization of needle entry into the vessel using a standard micropuncture technique. A wire was advanced into the IVC and serial fascial dilation performed. A 10 French tips sheath was placed into the internal jugular vein and advanced to the IVC. The jugular sheath was retracted into the right atrium and manometry was performed. A 5 French angled tip catheter was then directed into the middle hepatic vein. The catheter was advanced to a wedge portion of the a patent vein over which the 10 French sheath was advanced into the middle hepatic vein. Using ICE ultrasound visualization the catheter as middle hepatic vein as well as the portal anatomy was defined. A planned exit site from the hepatic vein and puncture site from the portal vein was placed into a single sonographic plane. Under direct ultrasound visualization, the ScorpionX needle was advanced into the central right portal vein. Hand injection of contrast confirmed position within the portal system. A Glidewire Advantage was then advanced into the splenic vein. A 5  French marking pigtail catheter was then advanced over the wire into the main portal vein and wire removed. Portal venogram was performed which demonstrated a patent portal vein. A large left gastric vein with retrograde filling into multiple gastroesophageal varices was seen arising from the main portal vein. Portal manometry was then performed. The tract was then dilated to 8 mm with an 8 mm x 8 cm Athletis balloon. A 8-10 mm by 7 + 2 cm of Viatorr endograft was placed. This was ultimately dilated to 8 mm. After placement of the shunt, right atrial and portal pressures were repeated. A C2  catheter was then directed into the left gastric vein. Dedicated venogram demonstrated retrograde flow into large gastroesophageal varices. Coil embolization was then performed with an assortment of 0.035 detachable Azur coils. Completion portal venogram demonstrates a patent TIPS endograft without complete embolization of the left gastric vein. The catheters and sheath were removed and manual compression was applied to the right internal jugular and right common femoral venous access sites until hemostasis was achieved. The patient was transferred to the PACU in stable condition. Pre-TIPS Mean Pressures (mmHg): Right atrium: 20 Portal vein: 39 Portosystemic gradient: 19 Post-TIPS Mean Pressures (mmHg): Right atrium:24 Portal vein: 32 Portosystemic gradient: 8 IMPRESSION: 1. Successful transjugular portosystemic shunt creation. 2. Technically successful coil embolization of the left gastric vein. 3. Portosystemic gradient of 19 mm Hg (absolute portal venous pressure 39 mm Hg) before shunt placement and 8 mm Hg (absolute portal venous pressure 33 mm Hg) after shunt placement and coil embolization. Ester Sides, MD [facility] Radiology Electronically Signed   By: Ester Sides M.D.   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 EXAM: 1. Ultrasound-guided right thoracentesis 2. Ultrasound-guided access of the right internal jugular vein 3. Ultrasound-guided access of the right common femoral vein 4. Intravascular ultrasound 5. Catheterization of the portal vein 6. Portal venous and central manometry 7. Portal venogram 8. Creation of a transhepatic portal vein to hepatic vein shunt 9. Coil embolization of left gastric vein. MEDICATIONS: As antibiotic prophylaxis,  Rocephin  1 gm IV was ordered pre-procedure and administered intravenously within one hour of incision. ANESTHESIA/SEDATION: General - as administered by the Anesthesia department CONTRAST:  One hundred twenty-five ML Omnipaque  300, intravenous FLUOROSCOPY TIME:  Seven hundred ninety-one mGy reference air kerma COMPLICATIONS: None immediate. PROCEDURE: Informed written consent was obtained from the patient after a thorough discussion of the procedural risks, benefits and alternatives. All questions were addressed. Maximal Sterile Barrier Technique was utilized including caps, mask, sterile gowns, sterile gloves, sterile drape, hand hygiene and skin antiseptic. A timeout was performed prior to the initiation of the procedure. Preprocedure ultrasound evaluation of the right posterior chest demonstrated large pleural effusion. The procedure was planned. Subdermal Local anesthesia was administered at the planned needle entry site with 1% lidocaine . Deeper local anesthetic was administered under ultrasound visualization to the level of the pleura. A small skin nick was made. Under direct ultrasound visualization, a 6 French skater drain was introduced and trocar fashion into the right pleural space. The inner stylet was removed. A total of 2.8 L of translucent, straw-colored fluid was removed. The catheter was then removed. A sterile bandage was applied. A preliminary ultrasound of the right groin was performed and demonstrates a patent right common femoral vein and greater saphenous  vein. A permanent ultrasound image was recorded. Using a combination of fluoroscopy and ultrasound, an access site was determined. A small dermatotomy was made at the planned puncture site. Using ultrasound guidance, access into the right greater saphenous vein was obtained with visualization of needle entry into the vessel using a standard micropuncture technique. A wire was advanced into the IVC insert all fascial dilation performed. An 8  French, 11 cm vascular sheath was placed into the external iliac vein. Through this access site, an 23 French Accunav ICE catheter was advanced with ease under fluoroscopic guidance to the level of the intrahepatic inferior vena cava. A preliminary ultrasound of the right neck was performed and demonstrates a patent internal jugular vein. A permanent ultrasound image was recorded. Using a combination of fluoroscopy and ultrasound, an access site was determined. A small dermatotomy was made at the planned puncture site. Using ultrasound guidance, access into the right internal jugular vein was obtained with visualization of needle entry into the vessel using a standard micropuncture technique. A wire was advanced into the IVC and serial fascial dilation performed. A 10 French tips sheath was placed into the internal jugular vein and advanced to the IVC. The jugular sheath was retracted into the right atrium and manometry was performed. A 5 French angled tip catheter was then directed into the middle hepatic vein. The catheter was advanced to a wedge portion of the a patent vein over which the 10 French sheath was advanced into the middle hepatic vein. Using ICE ultrasound visualization the catheter as middle hepatic vein as well as the portal anatomy was defined. A planned exit site from the hepatic vein and puncture site from the portal vein was placed into a single sonographic plane. Under direct ultrasound visualization, the ScorpionX needle was advanced into the central right portal vein. Hand injection of contrast confirmed position within the portal system. A Glidewire Advantage was then advanced into the splenic vein. A 5 French marking pigtail catheter was then advanced over the wire into the main portal vein and wire removed. Portal venogram was performed which demonstrated a patent portal vein. A large left gastric vein with retrograde filling into multiple gastroesophageal varices was seen arising from the  main portal vein. Portal manometry was then performed. The tract was then dilated to 8 mm with an 8 mm x 8 cm Athletis balloon. A 8-10 mm by 7 + 2 cm of Viatorr endograft was placed. This was ultimately dilated to 8 mm. After placement of the shunt, right atrial and portal pressures were repeated. A C2 catheter was then directed into the left gastric vein. Dedicated venogram demonstrated retrograde flow into large gastroesophageal varices. Coil embolization was then performed with an assortment of 0.035 detachable Azur coils. Completion portal venogram demonstrates a patent TIPS endograft without complete embolization of the left gastric vein. The catheters and sheath were removed and manual compression was applied to the right internal jugular and right common femoral venous access sites until hemostasis was achieved. The patient was transferred to the PACU in stable condition. Pre-TIPS Mean Pressures (mmHg): Right atrium: 20 Portal vein: 39 Portosystemic gradient: 19 Post-TIPS Mean Pressures (mmHg): Right atrium:24 Portal vein: 32 Portosystemic gradient: 8 IMPRESSION: 1. Successful transjugular portosystemic shunt creation. 2. Technically successful coil embolization of the left gastric vein. 3. Portosystemic gradient of 19 mm Hg (absolute portal venous pressure 39 mm Hg) before shunt placement and 8 mm Hg (absolute portal venous pressure 33 mm Hg) after shunt placement and coil embolization.  Ester Sides, MD [facility] Radiology Electronically Signed   By: Ester Sides M.D.   On: 12/14/2023 21:37   DG Chest Port 1 View Result Date: 12/14/2023 CLINICAL DATA:  Status post thoracentesis. EXAM: PORTABLE CHEST 1 VIEW COMPARISON:  Radiographs 12/04/2023 FINDINGS: Moderate right pleural effusion is more layering than on prior exam and likely increased in volume. No pneumothorax. Grossly stable heart size and mediastinal contours. No significant left pleural effusion  on AP view. Question vascular congestion but no edema. IMPRESSION: Moderate right pleural effusion is more layering than on prior exam and likely increased in volume. No pneumothorax. Electronically Signed   By: Andrea Gasman M.D.   On: 12/14/2023 16:08   IR THORACENTESIS ASP PLEURAL SPACE W/IMG GUIDE Result Date: 12/04/2023 INDICATION: History of NASH cirrhosis, portal hypertension, right hepatic hydrothorax/recurrent right pleural effusion. Request received for therapeutic right thoracentesis EXAM: ULTRASOUND GUIDED THERAPEUTIC RIGHT THORACENTESIS MEDICATIONS: 8 mL 1% lidocaine  COMPLICATIONS: None immediate. PROCEDURE: An ultrasound guided thoracentesis was thoroughly discussed with the patient and questions answered. The benefits, risks, alternatives and complications were also discussed. The patient understands and wishes to proceed with the procedure. Written consent was obtained. Ultrasound was performed to localize and mark an adequate pocket of fluid in the right chest. The area was then prepped and draped in the normal sterile fashion. 1% Lidocaine  was used for local anesthesia. Under ultrasound guidance a 6 Fr Safe-T-Centesis catheter was introduced. Thoracentesis was performed. The catheter was removed and a dressing applied. FINDINGS: A total of approximately 3.3 liters of hazy orange fluid was removed. IMPRESSION: Successful ultrasound guided right thoracentesis yielding 3.3 liters of pleural fluid. Performed and dictated by Kimble Clas, PA-C Electronically Signed   By: JONETTA Faes M.D.   On: 12/04/2023 16:25   DG Chest 1 View Result Date: 12/04/2023 EXAM: 1 VIEW(S) XRAY OF THE CHEST 12/04/2023 10:52:00 AM COMPARISON: 11/14/2023 CLINICAL HISTORY: status post right side thoracentesis - 3.3 liters FINDINGS: LUNGS AND PLEURA: Persistent low lung volumes. Unchanged blunting of the right costophrenic sulcus with right basilar airspace opacities. No pneumothorax following right thoracentesis. No  focal pulmonary opacity. HEART AND MEDIASTINUM: No acute abnormality of the cardiac and mediastinal silhouettes. BONES AND SOFT TISSUES: No acute osseous abnormality. IMPRESSION: 1. Unchanged small right pleural effusion with right basilar atelectasis. No pneumothorax. Electronically signed by: Rogelia Myers MD 12/04/2023 11:04 AM EST RP Workstation: [id number]   CT ANGIO ABD/PELVIS BRTO Result Date: 11/22/2023 CLINICAL DATA:  Hepatic cirrhosis with hepatic hydrothorax. EXAM: CTA ABDOMEN AND PELVIS WITHOUT AND WITH CONTRAST TECHNIQUE: Multidetector CT imaging of the abdomen and pelvis was performed using the standard protocol during bolus administration of intravenous contrast. Multiplanar reconstructed images and MIPs were obtained and reviewed to evaluate the vascular anatomy. RADIATION DOSE REDUCTION: This exam was performed according to the departmental dose-optimization program which includes automated exposure control, adjustment of the mA and/or kV according to patient size and/or use of iterative reconstruction technique. CONTRAST:  80mL ISOVUE -370 IOPAMIDOL  (ISOVUE -370) INJECTION 76% COMPARISON:  None Available. FINDINGS: VASCULAR Aorta: Scattered atherosclerotic vascular calcifications. No aneurysm or dissection. Celiac: Patent without evidence of aneurysm, dissection, vasculitis or significant stenosis. SMA: Patent without evidence of aneurysm, dissection, vasculitis or significant stenosis. Renals: Both renal arteries are patent without evidence of aneurysm, dissection, vasculitis, fibromuscular dysplasia or significant stenosis. IMA: Patent without evidence of aneurysm, dissection, vasculitis or significant stenosis. Inflow: Patent without evidence of aneurysm, dissection, vasculitis or significant stenosis. Proximal Outflow: Bilateral common femoral and visualized  portions of the superficial and profunda femoral arteries are patent without evidence of aneurysm, dissection, vasculitis or  significant stenosis. Veins: Widely patent main, right and left portal veins. The splenic vein is patent. Significant hypertrophy of the left gastric vein which measures 1.3 cm in diameter, similar to the main portal vein. This hypertrophic left gastric vein leads to a network of large esophageal varices. No evidence of gastric varices. No significant gastro renal shunt. The hepatic veins are not particularly well visualized. Review of the MIP images confirms the above findings. NON-VASCULAR Lower chest: Large right-sided pleural effusion. There is associated atelectasis. The heart is normal in size. No pericardial effusion. Hepatobiliary: Small liver with cirrhotic morphology. No arterially enhancing lesion. Small stones are present within the gallbladder lumen. Chronic thickening of the gallbladder lumen likely related to underlying portal hypertension and hypoalbuminemia. Pancreas: Unremarkable. No pancreatic ductal dilatation or surrounding inflammatory changes. Spleen: Splenomegaly. Adrenals/Urinary Tract: Normal adrenal glands. No hydronephrosis, nephrolithiasis or enhancing renal mass. Circumscribed water attenuation renal lesions consistent with simple cysts. The ureters and bladder are unremarkable. Stomach/Bowel: Diffuse mild submucosal edema of the small bowel likely reflects portal enteropathy. No evidence of obstruction. Colonic diverticular disease without CT evidence of active inflammation. Lymphatic: No suspicious lymphadenopathy. Reproductive: Prostate is unremarkable. Other: Umbilical hernia containing ascites and a portion of a loop of small bowel. Bilateral inguinal hernias. The right inguinal hernia contains ascites while the left contains only omental fat. Small volume ascites. Musculoskeletal: No acute fracture or aggressive appearing lytic or blastic osseous lesion. Multi level degenerative disc disease and facet arthropathy in the lumbar spine. IMPRESSION: 1. Hepatic cirrhosis with portal  hypertension as evidence by small volume ascites, large right hepatic hydrothorax, splenomegaly and significant hypertrophy of the left gastric vein feeding a large network of esophageal varices. 2. Widely patent portal venous system. Although the hepatic veins are not particularly well visualized, they do appear patent. 3. Probable portal enteropathy. 4. Umbilical and right inguinal hernia containing ascites. The umbilical hernia also contains a portion of a loop of small bowel. 5. Left inguinal hernia containing omental fat. 6. Aortic atherosclerotic vascular calcifications. Aortic Atherosclerosis (ICD10-I70.0). 7. Cholelithiasis. 8. Colonic diverticular disease without CT evidence of active inflammation. 9. Multilevel lumbar degenerative disc disease and facet arthropathy. Electronically Signed   By: Wilkie Lent M.D.   On: 11/22/2023 15:33       The results of significant diagnostics from this hospitalization (including imaging, microbiology, ancillary and laboratory) are listed below for reference.     Microbiology: Recent Results (from the past 240 hours)  MRSA Next Gen by PCR, Nasal     Status: None   Collection Time: 12/14/23  6:36 PM   Specimen: Nasal Mucosa; Nasal Swab  Result Value Ref Range Status   MRSA by PCR Next Gen NOT DETECTED NOT DETECTED Final    Comment: (NOTE) The GeneXpert MRSA Assay (FDA approved for NASAL specimens only), is one component of a comprehensive MRSA colonization surveillance program. It is not intended to diagnose MRSA infection nor to guide or monitor treatment for MRSA infections. Test performance is not FDA approved in patients less than 78 years old. Performed at [facility], [address] [place], [place] [address]      Labs:  CBC: Recent Labs  Lab 12/14/23 0914 12/14/23 1423 12/15/23 0402 12/16/23 0458  WBC 5.3  --  7.0 7.4  HGB 13.3 11.2* 11.2* 12.0*  HCT 37.8* 33.0* 32.4* 34.2*  MCV 100.3*  --  99.4 98.8  PLT 61*  --   41* 37*   BMP &GFR Recent Labs  Lab 12/14/23 0914 12/14/23 1423 12/15/23 0402 12/16/23 0458  NA 140 140 139 137  K 3.6 3.7 3.9 4.0  CL 105  --  101 105  CO2 27  --  28 22  GLUCOSE 107*  --  173* 123*  BUN 17  --  20 17  CREATININE 1.02  --  0.93 1.00  CALCIUM  8.1*  --  8.4* 8.1*  MG  --   --  2.0  --   PHOS  --   --  3.6  --    Estimated Creatinine Clearance: 74.6 mL/min (by C-G formula based on SCr of 1 mg/dL). Liver & Pancreas: Recent Labs  Lab 12/14/23 0914 12/15/23 0402  AST 47* 84*  ALT 28 43  ALKPHOS 87 73  BILITOT 3.1* 2.6*  PROT 6.2* 5.9*  ALBUMIN  2.4* 2.5*   No results for input(s): LIPASE, AMYLASE in the last 168 hours. Recent Labs  Lab 12/16/23 0458  AMMONIA 52*   Diabetic: Recent Labs    12/14/23 1821  HGBA1C 4.9   Recent Labs  Lab 12/15/23 0723 12/15/23 1120 12/15/23 2240 12/16/23 0840 12/16/23 1130  GLUCAP 147* 153* 139* 181* 109*   Cardiac Enzymes: No results for input(s): CKTOTAL, CKMB, CKMBINDEX, TROPONINI in the last 168 hours. No results for input(s): PROBNP in the last 8760 hours. Coagulation Profile: Recent Labs  Lab 12/14/23 0914 12/15/23 0402  INR 1.3* 1.8*   Thyroid  Function Tests: No results for input(s): TSH, T4TOTAL, FREET4, T3FREE, THYROIDAB in the last 72 hours. Lipid Profile: No results for input(s): CHOL, HDL, LDLCALC, TRIG, CHOLHDL, LDLDIRECT in the last 72 hours. Anemia Panel: No results for input(s): VITAMINB12, FOLATE, FERRITIN, TIBC, IRON, RETICCTPCT in the last 72 hours. Urine analysis:    Component Value Date/Time   COLORURINE YELLOW 03/08/2023 1144   APPEARANCEUR Turbid (A) 03/08/2023 1144   LABSPEC 1.025 03/08/2023 1144   PHURINE 6.0 03/08/2023 1144   GLUCOSEU NEGATIVE 03/08/2023 1144   HGBUR LARGE (A) 03/08/2023 1144   BILIRUBINUR NEGATIVE 03/08/2023 1144   BILIRUBINUR neg 02/21/2012 0819   KETONESUR NEGATIVE 03/08/2023 1144   PROTEINUR NEG  04/09/2014 1629   UROBILINOGEN 2.0 (A) 03/08/2023 1144   NITRITE NEGATIVE 03/08/2023 1144   LEUKOCYTESUR NEGATIVE 03/08/2023 1144   Sepsis Labs: Invalid input(s): PROCALCITONIN, LACTICIDVEN   SIGNED:  Reida Hem T Rebbie Lauricella, MD  [facility] 12/16/2023, 3:46 PM

## 2023-12-16 NOTE — Progress Notes (Signed)
 PT Cancellation Note  Patient Details Name: David Esparza MRN: [id number] DOB: March 11, 1950   Cancelled Treatment:    Reason Eval/Treat Not Completed: PT screened, no needs identified, will sign off. Discussed pt case with LPN who reports pt does not require a formal PT evaluation at this time. PT signing off. If needs change, please reconsult.     Leita JONETTA Sable 12/16/2023, 12:49 PM  Leita Sable, PT, DPT Acute Rehabilitation Services Secure Chat Preferred Office: [phone]

## 2023-12-16 NOTE — TOC Transition Note (Signed)
 Transition of Care [facility]) - Discharge Note   Patient Details  Name: David Esparza MRN: [id number] Date of Birth: [birth year]  Transition of [facility]) CM/SW Contact:  Tom-Johnson, Harvest Muskrat, RN Phone Number: 12/16/2023, 11:53 AM   Clinical Narrative:     Patient is scheduled for discharge today.  Readmission Risk Assessment done. Outpatient f/u, hospital f/u and discharge instructions on AVS. Prescriptions sent to [facility] pharmacy and patient will receive meds prior discharge. No ICM needs or recommendations noted. Wife, Reena at bedside and will transport at discharge.  No further ICM needs noted.      Final next level of care: Home/Self Care Barriers to Discharge: Barriers Resolved   Patient Goals and CMS Choice Patient states their goals for this hospitalization and ongoing recovery are:: To return home CMS Medicare.gov Compare Post Acute Care list provided to:: Patient Choice offered to / list presented to : NA      Discharge Placement                Patient to be transferred to facility by: Wife Name of family member notified: Reena    Discharge Plan and Services Additional resources added to the After Visit Summary for                                       Social Drivers of Health (SDOH) Interventions SDOH Screenings   Food Insecurity: No Food Insecurity (12/16/2023)  Housing: Low Risk  (12/16/2023)  Transportation Needs: No Transportation Needs (12/16/2023)  Utilities: Not At Risk (12/16/2023)  Alcohol Screen: Low Risk  (11/13/2023)  Depression (PHQ2-9): Low Risk  (02/28/2023)  Financial Resource Strain: Low Risk  (11/13/2023)  Physical Activity: Inactive (11/13/2023)  Social Connections: Socially Integrated (12/16/2023)  Stress: No Stress Concern Present (11/13/2023)  Tobacco Use: Medium Risk (12/14/2023)  Health Literacy: Adequate Health Literacy (02/28/2023)     Readmission Risk Interventions    12/16/2023   11:52 AM  Readmission Risk  Prevention Plan  Post Dischage Appt Complete  Medication Screening Complete  Transportation Screening Complete

## 2023-12-16 NOTE — Progress Notes (Signed)
 Discharge   Patient and wife  expressed verbal understanding of discharge POC.   Patient and wife given time to ask any questions.  Additional education included in AVS.  Alert oriented in good spirits.   PIV removed by Arthea PEAK. Pressure dressings intact.  TOC meds ready at pharmacy.  Transferred to MAIN A.

## 2023-12-17 LAB — POCT I-STAT 7, (LYTES, BLD GAS, ICA,H+H)
Acid-Base Excess: 2 mmol/L (ref 0.0–2.0)
Bicarbonate: 26.4 mmol/L (ref 20.0–28.0)
Calcium, Ion: 1.09 mmol/L — ABNORMAL LOW (ref 1.15–1.40)
HCT: 31 % — ABNORMAL LOW (ref 39.0–52.0)
Hemoglobin: 10.5 g/dL — ABNORMAL LOW (ref 13.0–17.0)
O2 Saturation: 94 %
Potassium: 3.9 mmol/L (ref 3.5–5.1)
Sodium: 141 mmol/L (ref 135–145)
TCO2: 28 mmol/L (ref 22–32)
pCO2 arterial: 40.1 mmHg (ref 32–48)
pH, Arterial: 7.427 (ref 7.35–7.45)
pO2, Arterial: 71 mmHg — ABNORMAL LOW (ref 83–108)

## 2023-12-18 ENCOUNTER — Telehealth (HOSPITAL_COMMUNITY): Payer: Self-pay | Admitting: Student

## 2023-12-18 LAB — BPAM RBC
Blood Product Expiration Date: 202512122359
Blood Product Expiration Date: 202512312359
ISSUE DATE / TIME: 202512041150
ISSUE DATE / TIME: 202512041150
Unit Type and Rh: 9500
Unit Type and Rh: 9500

## 2023-12-18 LAB — TYPE AND SCREEN
ABO/RH(D): O NEG
Antibody Screen: NEGATIVE
Unit division: 0
Unit division: 0

## 2023-12-18 LAB — GLUCOSE, CAPILLARY: Glucose-Capillary: 135 mg/dL — ABNORMAL HIGH (ref 70–99)

## 2023-12-18 NOTE — Telephone Encounter (Signed)
 Post-TIPS phone call attempted. Voice message left with request for return call.   Agness Sibrian, AGACNP-BC 12/18/2023, 3:30 PM

## 2023-12-20 ENCOUNTER — Telehealth (HOSPITAL_COMMUNITY): Payer: Self-pay | Admitting: Student

## 2023-12-20 NOTE — Telephone Encounter (Signed)
° °  Portal Hypertension Clinic TIPS post-procedure phone call follow-up   David Esparza is a 73 y.o. male who underwent TIPS creation at [facility] 12/14/23 by Dr. Jennefer MD.  Patient is 6 days from procedure.   # of thoracentesis in last month: 3 # of thoracentesis in last 2 months: 4  Current diuretic regimen: Lasix  40 mg daily; aldactone  50 mg daily  Current pharmacologic encephalopathy prophylaxis/treatment: Lactulose  20 g TID  Post-TIPS Imaging: None   Labs: Creatinine: 0.93 Total Bilirubin: 2.6 INR: 1.8 Sodium: 139 Albumin : 2.5 Ammonia: 52  Assessment: David Esparza is a 73 y.o. male with history of cirrhosis with portal hypertension, esophageal varices and recurrent hepatic hydrothorax. Patient is 6 days from TIPS creation and is feeling well with the exception of some abdominal distention/bloating with gas. He called IR last night with complaints of bloating and gas without any other concerning symptoms. He also stated he was having some mild diarrhea. He was taking lactulose  three times daily and he was advised to decrease dose to twice daily. He was also encouraged to get some Gas X or other simethicone containing medication.   Today the patient reports feeling better. He did get some GasX last night and while he is still bloated/passing gas it has decreased. He denies shortness of breath and stated he doesn't think the right pleural effusion has reaccumulated. He can normally tell when the fluid is building up. He previously had some right lower extremity swelling and this has decreased. His procedure sites (right IJ and right femoral sites) are healing well without complication. He is taking all of his other medications as directed. He asked about discontinuing his diuretics and I encouraged him to follow up with his pulmonologist for direction.   Recommendation:  Continue current treatment plan with next Clinic follow up scheduled for 12/27/23.  Electronically  Signed: Warren JONELLE Dais, NP 12/20/2023, 9:22 AM

## 2023-12-21 ENCOUNTER — Telehealth (HOSPITAL_COMMUNITY): Payer: Self-pay | Admitting: Student

## 2023-12-21 DIAGNOSIS — Z95828 Presence of other vascular implants and grafts: Secondary | ICD-10-CM

## 2023-12-21 NOTE — Telephone Encounter (Signed)
 Patient called IR today with complaints of worsening abdominal distention. Order placed for outpatient paracentesis 12/22/23.  Warren Dais, AGACNP-BC 12/21/2023, 1:29 PM

## 2023-12-22 ENCOUNTER — Telehealth (HOSPITAL_COMMUNITY): Payer: Self-pay | Admitting: Student

## 2023-12-22 ENCOUNTER — Ambulatory Visit (HOSPITAL_COMMUNITY)
Admission: RE | Admit: 2023-12-22 | Discharge: 2023-12-22 | Disposition: A | Source: Ambulatory Visit | Attending: Student

## 2023-12-22 DIAGNOSIS — Z95828 Presence of other vascular implants and grafts: Secondary | ICD-10-CM

## 2023-12-22 DIAGNOSIS — R188 Other ascites: Secondary | ICD-10-CM | POA: Diagnosis present

## 2023-12-22 HISTORY — PX: IR PARACENTESIS: IMG2679

## 2023-12-22 MED ORDER — LIDOCAINE-EPINEPHRINE 1 %-1:100000 IJ SOLN
20.0000 mL | Freq: Once | INTRAMUSCULAR | Status: AC
  Administered 2023-12-22: 10 mL

## 2023-12-22 MED ORDER — ALBUMIN HUMAN 25 % IV SOLN
INTRAVENOUS | Status: AC
  Filled 2023-12-22: qty 200

## 2023-12-22 MED FILL — Lidocaine Inj 1% w/ Epinephrine-1:100000: INTRAMUSCULAR | Qty: 1 | Status: AC

## 2023-12-22 NOTE — Progress Notes (Signed)
 Attempted to start IV on patient to administer IV albumin  per radiology protocol and was unsuccessful.  I stuck the patient once and another RN stuck the patient once without success. Patient stated he did not want the albumin .  No more attempts made.  Patient discharged after completion on paracentesis.

## 2023-12-22 NOTE — Procedures (Signed)
Ultrasound-guided therapeutic paracentesis performed yielding 6.6 liters of straw colored fluid.  No immediate complications. EBL is none.

## 2023-12-22 NOTE — Telephone Encounter (Signed)
 Paracentesis order changed from [facility] to [facility].  Warren Dais, AGACNP-BC 12/22/2023, 9:29 AM

## 2023-12-28 ENCOUNTER — Telehealth (HOSPITAL_COMMUNITY): Payer: Self-pay | Admitting: Student

## 2023-12-28 DIAGNOSIS — Z95828 Presence of other vascular implants and grafts: Secondary | ICD-10-CM

## 2023-12-28 NOTE — Telephone Encounter (Signed)
° °  Portal Hypertension Clinic TIPS post-procedure phone call follow-up  David Esparza is a 73 y.o. male who underwent TIPS creation at [facility] 12/14/23 by Dr. Jennefer MD.   Patient is two weeks from procedure.    # of paracentesis in last month: 1 # of thoracentesis in last month: 2 # of thoracentesis in last 2 months: 3   Current diuretic regimen: Lasix  40 mg daily; aldactone  50 mg daily  Current pharmacologic encephalopathy prophylaxis/treatment: Lactulose  20 g BID   Post-TIPS Imaging: None   Labs: Creatinine: 0.93 Total Bilirubin: 2.6 INR: 1.8 Sodium: 139 Albumin : 2.5 Ammonia: 52  Assessment: David Esparza is a 73 y.o. male with history of cirrhosis with portal hypertension, esophageal varices and recurrent hepatic hydrothorax. Patient is 2 weeks from TIPS creation and is feeling better than when we last spoke. He did develop ascites and underwent paracentesis on 12/22/22 with 6.6 L removed. Today he denies any feelings of shortness of breath or abdominal distention. He is taking lactulose  BID and is having 1-2 loose bowel movements daily. He has some mild nausea and his appetite his still low. He is sleeping well but is easily fatigued. All in all he is feeling well and has no complaints or concerns at this time. He has an appointment tomorrow with his pulmonologist.   Recommendation:  Continue current treatment plan with next Clinic follow up scheduled for 01/03/24. An outpatient order was placed today for the patient to follow up with Dr. Jennefer in one month.   Electronically Signed: Warren JONELLE Dais, NP 12/28/2023, 11:47 AM

## 2023-12-28 NOTE — Progress Notes (Unsigned)
° °  Established Patient Pulmonology Office Visit   Subjective:  Patient ID: David Esparza, male    DOB: 12-27-1950  MRN: [id number]  CC: No chief complaint on file.   HPI  David Esparza is a 73 y.o. male with HTN, HLD, Morbid Obesity, NASH cirrhosis, and Bell's Palsy who presents for follow up of recurrent hepatic hydrothorax now s/p TIPs 12/14/2023.  8 thoracentesis - drains between 1.6 to 3 L 1 paracentesis - drained 6.6 L  {PULM QUESTIONNAIRES (Optional):33196}  ROS  {History (Optional):23778} Current Medications[1]      Objective:  There were no vitals taken for this visit. {Pulm Vitals (Optional):32837}  Physical Exam   Diagnostic Review:  {Labs (Optional):32838}     Assessment & Plan:   Assessment & Plan   No orders of the defined types were placed in this encounter.     No follow-ups on file.   Matei Magnone, MD    [1]  Current Outpatient Medications:    albuterol  (VENTOLIN  HFA) 108 (90 Base) MCG/ACT inhaler, Inhale 2 puffs into the lungs every 6 (six) hours as needed for wheezing or shortness of breath. (Patient not taking: Reported on 12/11/2023), Disp: 18 g, Rfl: 2   furosemide  (LASIX ) 40 MG tablet, Take 1 tablet (40 mg total) by mouth daily., Disp: 90 tablet, Rfl: 0   lactulose  (CHRONULAC ) 10 GM/15ML solution, Take 20 gm (30 ml) two to three times a day for a goal of 2-3 bowel movements a day, Disp: 2838 mL, Rfl: 0   spironolactone  (ALDACTONE ) 50 MG tablet, Take 1 tablet (50 mg total) by mouth daily., Disp: 90 tablet, Rfl: 0

## 2023-12-29 ENCOUNTER — Ambulatory Visit (INDEPENDENT_AMBULATORY_CARE_PROVIDER_SITE_OTHER): Admitting: Pulmonary Disease

## 2023-12-29 ENCOUNTER — Encounter (HOSPITAL_BASED_OUTPATIENT_CLINIC_OR_DEPARTMENT_OTHER): Payer: Self-pay | Admitting: Pulmonary Disease

## 2023-12-29 VITALS — BP 118/72 | HR 70 | Ht 67.0 in | Wt 231.0 lb

## 2023-12-29 DIAGNOSIS — J918 Pleural effusion in other conditions classified elsewhere: Secondary | ICD-10-CM | POA: Diagnosis not present

## 2023-12-29 DIAGNOSIS — K7581 Nonalcoholic steatohepatitis (NASH): Secondary | ICD-10-CM

## 2023-12-29 DIAGNOSIS — R188 Other ascites: Secondary | ICD-10-CM

## 2023-12-29 DIAGNOSIS — K7469 Other cirrhosis of liver: Secondary | ICD-10-CM

## 2023-12-29 LAB — BASIC METABOLIC PANEL WITH GFR
BUN/Creatinine Ratio: 16 (ref 10–24)
BUN: 21 mg/dL (ref 8–27)
CO2: 23 mmol/L (ref 20–29)
Calcium: 8.5 mg/dL — ABNORMAL LOW (ref 8.6–10.2)
Chloride: 96 mmol/L (ref 96–106)
Creatinine, Ser: 1.28 mg/dL — ABNORMAL HIGH (ref 0.76–1.27)
Glucose: 104 mg/dL — ABNORMAL HIGH (ref 70–99)
Potassium: 4.3 mmol/L (ref 3.5–5.2)
Sodium: 134 mmol/L (ref 134–144)
eGFR: 59 mL/min/1.73 — ABNORMAL LOW

## 2023-12-29 NOTE — Patient Instructions (Signed)
" °  VISIT SUMMARY: Today, we discussed your recent TIPS procedure and the associated fluid retention and breathing difficulties. We reviewed your current medications and made plans for further management of your liver cirrhosis, ascites, and pleural effusion.  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 -Continue taking Lasix  40 mg, two tablets in the morning and one tablet in the evening. -Continue taking spironolactone  50 mg, one tablet daily. -We have ordered blood work to help guide any necessary adjustments to your diuretics. -Based on your blood work results, we may increase your spironolactone  to two tablets daily. -Schedule a follow-up appointment to review your blood work and adjust your treatment as needed.  HEPATIC HYDROTHORAX (PLEURAL EFFUSION ASSOCIATED WITH HEPATIC DISORDER): You have a persistent right pleural effusion likely due to your liver cirrhosis, which is causing breathing difficulties. -We have scheduled a pleural fluid drainage at [facility]. -Ensure you receive albumin  during the drainage procedure.  ASCITES DUE TO LIVER CIRRHOSIS: You have recurrent ascites, which is fluid accumulation in your abdomen due to liver cirrhosis. -Continue your current diuretic regimen with Lasix  and spironolactone . -We have ordered blood work to help guide any necessary adjustments to your diuretics. -Based on your blood work results, we may increase your spironolactone  to two tablets daily. -Schedule a follow-up appointment to review your blood work and adjust your treatment as needed.  Contains text generated by [employer].  "

## 2023-12-31 ENCOUNTER — Ambulatory Visit: Payer: Self-pay | Admitting: Pulmonary Disease

## 2023-12-31 DIAGNOSIS — K7581 Nonalcoholic steatohepatitis (NASH): Secondary | ICD-10-CM

## 2024-01-05 ENCOUNTER — Ambulatory Visit (HOSPITAL_COMMUNITY)
Admission: RE | Admit: 2024-01-05 | Discharge: 2024-01-05 | Disposition: A | Discharge status: Home / Self Care | Source: Ambulatory Visit | Attending: Radiology | Admitting: Radiology

## 2024-01-05 ENCOUNTER — Ambulatory Visit (HOSPITAL_COMMUNITY)
Admission: RE | Admit: 2024-01-05 | Discharge: 2024-01-05 | Disposition: A | Discharge status: Home / Self Care | Source: Ambulatory Visit | Attending: Pulmonary Disease | Admitting: Pulmonary Disease

## 2024-01-05 ENCOUNTER — Ambulatory Visit (HOSPITAL_COMMUNITY)

## 2024-01-05 DIAGNOSIS — J918 Pleural effusion in other conditions classified elsewhere: Secondary | ICD-10-CM

## 2024-01-05 DIAGNOSIS — K7581 Nonalcoholic steatohepatitis (NASH): Secondary | ICD-10-CM | POA: Diagnosis not present

## 2024-01-05 DIAGNOSIS — J90 Pleural effusion, not elsewhere classified: Secondary | ICD-10-CM | POA: Insufficient documentation

## 2024-01-05 DIAGNOSIS — K7469 Other cirrhosis of liver: Secondary | ICD-10-CM | POA: Diagnosis not present

## 2024-01-05 MED ORDER — LIDOCAINE-EPINEPHRINE (PF) 2 %-1:200000 IJ SOLN
INTRAMUSCULAR | Status: AC
  Filled 2024-01-05: qty 20

## 2024-01-05 NOTE — Procedures (Signed)
 Ultrasound-guided  therapeutic right thoracentesis performed yielding 2.4 liters of yellow  fluid. No immediate complications. Follow-up chest x-ray pending. EBL none.  Due to patient coughing only the above amount of fluid was removed today.

## 2024-01-08 ENCOUNTER — Ambulatory Visit (HOSPITAL_COMMUNITY)
Admission: RE | Admit: 2024-01-08 | Discharge: 2024-01-08 | Disposition: A | Discharge status: Home / Self Care | Source: Ambulatory Visit | Attending: Pulmonary Disease | Admitting: Pulmonary Disease

## 2024-01-08 DIAGNOSIS — J918 Pleural effusion in other conditions classified elsewhere: Secondary | ICD-10-CM | POA: Diagnosis not present

## 2024-01-08 DIAGNOSIS — R188 Other ascites: Secondary | ICD-10-CM | POA: Insufficient documentation

## 2024-01-08 DIAGNOSIS — K7469 Other cirrhosis of liver: Secondary | ICD-10-CM | POA: Diagnosis not present

## 2024-01-08 DIAGNOSIS — K7581 Nonalcoholic steatohepatitis (NASH): Secondary | ICD-10-CM | POA: Insufficient documentation

## 2024-01-08 MED ORDER — LIDOCAINE HCL 1 % IJ SOLN
INTRAMUSCULAR | Status: AC
  Filled 2024-01-08: qty 10

## 2024-01-08 NOTE — Procedures (Signed)
 Ultrasound-guided therapeutic paracentesis performed yielding 2 liters of yellow fluid. No immediate complications. EBL none. Due to pt hypotension only the above amount of fluid was removed today.

## 2024-01-16 ENCOUNTER — Emergency Department (HOSPITAL_COMMUNITY)

## 2024-01-16 ENCOUNTER — Encounter (HOSPITAL_COMMUNITY): Payer: Self-pay

## 2024-01-16 ENCOUNTER — Inpatient Hospital Stay (HOSPITAL_COMMUNITY)

## 2024-01-16 ENCOUNTER — Other Ambulatory Visit: Payer: Self-pay

## 2024-01-16 ENCOUNTER — Inpatient Hospital Stay (HOSPITAL_COMMUNITY): Admission: EM | Admit: 2024-01-16 | Discharge: 2024-02-11 | DRG: 405 | Disposition: E

## 2024-01-16 DIAGNOSIS — K652 Spontaneous bacterial peritonitis: Secondary | ICD-10-CM | POA: Diagnosis present

## 2024-01-16 DIAGNOSIS — D631 Anemia in chronic kidney disease: Secondary | ICD-10-CM | POA: Diagnosis not present

## 2024-01-16 DIAGNOSIS — I700 Atherosclerosis of aorta: Secondary | ICD-10-CM | POA: Diagnosis present

## 2024-01-16 DIAGNOSIS — D72829 Elevated white blood cell count, unspecified: Secondary | ICD-10-CM

## 2024-01-16 DIAGNOSIS — I8510 Secondary esophageal varices without bleeding: Secondary | ICD-10-CM | POA: Diagnosis present

## 2024-01-16 DIAGNOSIS — Z91148 Patient's other noncompliance with medication regimen for other reason: Secondary | ICD-10-CM

## 2024-01-16 DIAGNOSIS — K7682 Hepatic encephalopathy: Secondary | ICD-10-CM | POA: Diagnosis present

## 2024-01-16 DIAGNOSIS — K920 Hematemesis: Secondary | ICD-10-CM | POA: Diagnosis present

## 2024-01-16 DIAGNOSIS — D684 Acquired coagulation factor deficiency: Secondary | ICD-10-CM | POA: Diagnosis not present

## 2024-01-16 DIAGNOSIS — E870 Hyperosmolality and hypernatremia: Secondary | ICD-10-CM | POA: Diagnosis present

## 2024-01-16 DIAGNOSIS — E860 Dehydration: Secondary | ICD-10-CM | POA: Diagnosis present

## 2024-01-16 DIAGNOSIS — K7581 Nonalcoholic steatohepatitis (NASH): Secondary | ICD-10-CM | POA: Diagnosis present

## 2024-01-16 DIAGNOSIS — K764 Peliosis hepatis: Secondary | ICD-10-CM | POA: Diagnosis not present

## 2024-01-16 DIAGNOSIS — R578 Other shock: Secondary | ICD-10-CM | POA: Diagnosis not present

## 2024-01-16 DIAGNOSIS — Z87891 Personal history of nicotine dependence: Secondary | ICD-10-CM

## 2024-01-16 DIAGNOSIS — Z818 Family history of other mental and behavioral disorders: Secondary | ICD-10-CM

## 2024-01-16 DIAGNOSIS — E66812 Obesity, class 2: Secondary | ICD-10-CM | POA: Diagnosis present

## 2024-01-16 DIAGNOSIS — K7469 Other cirrhosis of liver: Secondary | ICD-10-CM | POA: Diagnosis not present

## 2024-01-16 DIAGNOSIS — D6959 Other secondary thrombocytopenia: Secondary | ICD-10-CM | POA: Diagnosis present

## 2024-01-16 DIAGNOSIS — K922 Gastrointestinal hemorrhage, unspecified: Secondary | ICD-10-CM

## 2024-01-16 DIAGNOSIS — Z79899 Other long term (current) drug therapy: Secondary | ICD-10-CM

## 2024-01-16 DIAGNOSIS — K7681 Hepatopulmonary syndrome: Secondary | ICD-10-CM | POA: Diagnosis not present

## 2024-01-16 DIAGNOSIS — Z860102 Personal history of hyperplastic colon polyps: Secondary | ICD-10-CM

## 2024-01-16 DIAGNOSIS — D689 Coagulation defect, unspecified: Secondary | ICD-10-CM | POA: Diagnosis not present

## 2024-01-16 DIAGNOSIS — I81 Portal vein thrombosis: Secondary | ICD-10-CM | POA: Diagnosis present

## 2024-01-16 DIAGNOSIS — R579 Shock, unspecified: Secondary | ICD-10-CM

## 2024-01-16 DIAGNOSIS — I864 Gastric varices: Secondary | ICD-10-CM | POA: Diagnosis present

## 2024-01-16 DIAGNOSIS — K55059 Acute (reversible) ischemia of intestine, part and extent unspecified: Secondary | ICD-10-CM | POA: Diagnosis present

## 2024-01-16 DIAGNOSIS — D696 Thrombocytopenia, unspecified: Secondary | ICD-10-CM | POA: Diagnosis not present

## 2024-01-16 DIAGNOSIS — Z95828 Presence of other vascular implants and grafts: Secondary | ICD-10-CM | POA: Diagnosis not present

## 2024-01-16 DIAGNOSIS — J918 Pleural effusion in other conditions classified elsewhere: Secondary | ICD-10-CM | POA: Diagnosis present

## 2024-01-16 DIAGNOSIS — Z6836 Body mass index (BMI) 36.0-36.9, adult: Secondary | ICD-10-CM

## 2024-01-16 DIAGNOSIS — E785 Hyperlipidemia, unspecified: Secondary | ICD-10-CM | POA: Diagnosis present

## 2024-01-16 DIAGNOSIS — T82898A Other specified complication of vascular prosthetic devices, implants and grafts, initial encounter: Secondary | ICD-10-CM | POA: Diagnosis not present

## 2024-01-16 DIAGNOSIS — F32A Depression, unspecified: Secondary | ICD-10-CM | POA: Diagnosis present

## 2024-01-16 DIAGNOSIS — E119 Type 2 diabetes mellitus without complications: Secondary | ICD-10-CM | POA: Diagnosis present

## 2024-01-16 DIAGNOSIS — K7290 Hepatic failure, unspecified without coma: Secondary | ICD-10-CM | POA: Diagnosis not present

## 2024-01-16 DIAGNOSIS — K429 Umbilical hernia without obstruction or gangrene: Secondary | ICD-10-CM | POA: Diagnosis present

## 2024-01-16 DIAGNOSIS — J9690 Respiratory failure, unspecified, unspecified whether with hypoxia or hypercapnia: Secondary | ICD-10-CM | POA: Diagnosis present

## 2024-01-16 DIAGNOSIS — Y838 Other surgical procedures as the cause of abnormal reaction of the patient, or of later complication, without mention of misadventure at the time of the procedure: Secondary | ICD-10-CM | POA: Diagnosis not present

## 2024-01-16 DIAGNOSIS — N179 Acute kidney failure, unspecified: Secondary | ICD-10-CM | POA: Diagnosis present

## 2024-01-16 DIAGNOSIS — Z8249 Family history of ischemic heart disease and other diseases of the circulatory system: Secondary | ICD-10-CM

## 2024-01-16 DIAGNOSIS — Z800 Family history of malignant neoplasm of digestive organs: Secondary | ICD-10-CM

## 2024-01-16 DIAGNOSIS — D638 Anemia in other chronic diseases classified elsewhere: Secondary | ICD-10-CM | POA: Diagnosis present

## 2024-01-16 DIAGNOSIS — K766 Portal hypertension: Secondary | ICD-10-CM | POA: Diagnosis present

## 2024-01-16 DIAGNOSIS — D539 Nutritional anemia, unspecified: Secondary | ICD-10-CM | POA: Diagnosis not present

## 2024-01-16 DIAGNOSIS — R188 Other ascites: Secondary | ICD-10-CM | POA: Diagnosis present

## 2024-01-16 DIAGNOSIS — R609 Edema, unspecified: Secondary | ICD-10-CM

## 2024-01-16 DIAGNOSIS — K4020 Bilateral inguinal hernia, without obstruction or gangrene, not specified as recurrent: Secondary | ICD-10-CM | POA: Diagnosis present

## 2024-01-16 DIAGNOSIS — D731 Hypersplenism: Secondary | ICD-10-CM | POA: Diagnosis present

## 2024-01-16 DIAGNOSIS — I10 Essential (primary) hypertension: Secondary | ICD-10-CM | POA: Diagnosis present

## 2024-01-16 DIAGNOSIS — Z66 Do not resuscitate: Secondary | ICD-10-CM | POA: Diagnosis not present

## 2024-01-16 DIAGNOSIS — Z539 Procedure and treatment not carried out, unspecified reason: Secondary | ICD-10-CM | POA: Diagnosis present

## 2024-01-16 DIAGNOSIS — R7989 Other specified abnormal findings of blood chemistry: Secondary | ICD-10-CM

## 2024-01-16 DIAGNOSIS — R32 Unspecified urinary incontinence: Secondary | ICD-10-CM | POA: Diagnosis present

## 2024-01-16 DIAGNOSIS — Z833 Family history of diabetes mellitus: Secondary | ICD-10-CM

## 2024-01-16 DIAGNOSIS — K7460 Unspecified cirrhosis of liver: Secondary | ICD-10-CM | POA: Diagnosis present

## 2024-01-16 DIAGNOSIS — E8770 Fluid overload, unspecified: Secondary | ICD-10-CM | POA: Diagnosis present

## 2024-01-16 DIAGNOSIS — K769 Liver disease, unspecified: Secondary | ICD-10-CM

## 2024-01-16 DIAGNOSIS — R4182 Altered mental status, unspecified: Principal | ICD-10-CM

## 2024-01-16 DIAGNOSIS — R54 Age-related physical debility: Secondary | ICD-10-CM | POA: Diagnosis present

## 2024-01-16 LAB — CBC
HCT: 43 % (ref 39.0–52.0)
Hemoglobin: 15.5 g/dL (ref 13.0–17.0)
MCH: 36.6 pg — ABNORMAL HIGH (ref 26.0–34.0)
MCHC: 36 g/dL (ref 30.0–36.0)
MCV: 101.4 fL — ABNORMAL HIGH (ref 80.0–100.0)
Platelets: 68 K/uL — ABNORMAL LOW (ref 150–400)
RBC: 4.24 MIL/uL (ref 4.22–5.81)
RDW: 15.5 % (ref 11.5–15.5)
WBC: 11.9 K/uL — ABNORMAL HIGH (ref 4.0–10.5)
nRBC: 0 % (ref 0.0–0.2)

## 2024-01-16 LAB — URINALYSIS, ROUTINE W REFLEX MICROSCOPIC
Bilirubin Urine: NEGATIVE
Glucose, UA: NEGATIVE mg/dL
Hgb urine dipstick: NEGATIVE
Ketones, ur: NEGATIVE mg/dL
Leukocytes,Ua: NEGATIVE
Nitrite: NEGATIVE
Protein, ur: NEGATIVE mg/dL
Specific Gravity, Urine: 1.014 (ref 1.005–1.030)
pH: 5 (ref 5.0–8.0)

## 2024-01-16 LAB — PROTIME-INR
INR: 1.5 — ABNORMAL HIGH (ref 0.8–1.2)
Prothrombin Time: 19.1 s — ABNORMAL HIGH (ref 11.4–15.2)

## 2024-01-16 LAB — COMPREHENSIVE METABOLIC PANEL WITH GFR
ALT: 25 U/L (ref 0–44)
AST: 43 U/L — ABNORMAL HIGH (ref 15–41)
Albumin: 2.8 g/dL — ABNORMAL LOW (ref 3.5–5.0)
Alkaline Phosphatase: 99 U/L (ref 38–126)
Anion gap: 14 (ref 5–15)
BUN: 63 mg/dL — ABNORMAL HIGH (ref 8–23)
CO2: 21 mmol/L — ABNORMAL LOW (ref 22–32)
Calcium: 8.6 mg/dL — ABNORMAL LOW (ref 8.9–10.3)
Chloride: 98 mmol/L (ref 98–111)
Creatinine, Ser: 2.61 mg/dL — ABNORMAL HIGH (ref 0.61–1.24)
GFR, Estimated: 25 mL/min — ABNORMAL LOW
Glucose, Bld: 110 mg/dL — ABNORMAL HIGH (ref 70–99)
Potassium: 4.8 mmol/L (ref 3.5–5.1)
Sodium: 133 mmol/L — ABNORMAL LOW (ref 135–145)
Total Bilirubin: 3.8 mg/dL — ABNORMAL HIGH (ref 0.0–1.2)
Total Protein: 7.1 g/dL (ref 6.5–8.1)

## 2024-01-16 LAB — CBG MONITORING, ED: Glucose-Capillary: 109 mg/dL — ABNORMAL HIGH (ref 70–99)

## 2024-01-16 LAB — SODIUM, URINE, RANDOM: Sodium, Ur: <30 mmol/L

## 2024-01-16 LAB — AMMONIA: Ammonia: 70 umol/L — ABNORMAL HIGH (ref 9–35)

## 2024-01-16 MED ORDER — LACTULOSE 10 GM/15ML PO SOLN
30.0000 g | Freq: Three times a day (TID) | ORAL | Status: DC
  Administered 2024-01-16 – 2024-01-20 (×13): 30 g via ORAL
  Filled 2024-01-16: qty 60
  Filled 2024-01-16: qty 45
  Filled 2024-01-16 (×2): qty 60
  Filled 2024-01-16: qty 45
  Filled 2024-01-16 (×8): qty 60
  Filled 2024-01-16: qty 45
  Filled 2024-01-16: qty 60

## 2024-01-16 MED ORDER — ALBUMIN HUMAN 25 % IV SOLN
25.0000 g | Freq: Four times a day (QID) | INTRAVENOUS | Status: AC
  Administered 2024-01-16: 25 g via INTRAVENOUS
  Administered 2024-01-16: 12.5 g via INTRAVENOUS
  Filled 2024-01-16 (×2): qty 100

## 2024-01-16 MED ORDER — LACTULOSE 10 GM/15ML PO SOLN
30.0000 g | Freq: Once | ORAL | Status: AC
  Administered 2024-01-16: 30 g via ORAL
  Filled 2024-01-16: qty 60

## 2024-01-16 MED ORDER — RIFAXIMIN 550 MG PO TABS
550.0000 mg | ORAL_TABLET | Freq: Two times a day (BID) | ORAL | Status: DC
  Administered 2024-01-16 – 2024-01-25 (×20): 550 mg via ORAL
  Filled 2024-01-16 (×20): qty 1

## 2024-01-16 MED ORDER — SODIUM CHLORIDE 0.9 % IV SOLN: 2.0000 g | INTRAVENOUS | Status: DC

## 2024-01-16 MED ORDER — LACTULOSE 10 GM/15ML PO SOLN: 30.0000 g | Freq: Three times a day (TID) | ORAL | Status: DC

## 2024-01-16 NOTE — Consult Note (Addendum)
 "  Referring Provider: Dr. Glendia Breeding  Primary Care Physician:  Domenica Harlene LABOR, MD Primary Gastroenterologist:  Dr. Sandor Flatter   Reason for Consultation: Decompensated cirrhosis  HPI: David Esparza is a 74 y.o. male with a past medical history of hypertension, hyperlipidemia, diabetes mellitus type 2, sleep apnea, obesity, gout, Bell's palsy, gallstones, diverticulosis, colon polyps and decompensated MASH cirrhosis with esophageal varices, thrombocytopenia, splenomegaly, ascites and recurrent hepatic hydrothorax s/p multiple thoracentesis and subsequently underwent TIPS procedure per IR 12/14/2023 along with right thoracocentesis and left gastric vein embolization.  He became hypotensive post TIPS procedure despite albumin  IV, he required Levophed  and was admitted to the ICU.  His hemodynamic status stabilized and he was weaned off Levophed .  He was discharged home 12/16/2023 on Furosemide  40mg  QD, Aldactone  50mg  every day and Lactulose  20gm tid.  Carvedilol  and Lisinopril  were discontinued.  No prior history of hepatic encephalopathy, however, he was prescribed lactulose  status post TIPS procedure during his hospitalization 12/14/2023.  Since he was discharged from the hospital as noted above, he underwent a paracentesis per IR 12/22/2023 6.6L of peritoneal fluid removed. Status post therapeutic right thoracentesis 01/05/2024, 2.4L of pleural fluid removed. S/P paracentesis 01/08/2024, 2L of peritoneal fluid removed.  Note, peritoneal fluid labs were not collected 12/12 or 12/29, therefore SBP was not ruled out.  Patient presented to [facility] ED via EMS earlier today secondary to altered mental status, increased abdominal distention, diarrhea and poor p.o. intake.  Family noted patient had been confused for the past 2 to 3 days and has been noncompliant with his medications.  Labs in the ED showed a WBC count of 11.9.  Hemoglobin 15.5 (Hg 12.0 on 12/6).  MCV 101.4.  Platelets  68.  Sodium 133.  Potassium 4.8.  Glucose 110.  BUN 63.  Creatinine 2.61 up from 1.28 on 12/19.  Total bili 3.8.  Alk phos 99.  AST 43.  ALT 25.  INR not done.  Ammonia 70.  Head CT without contrast showed chronic appearing small right cerebellar infarct without acute intracranial abnormality.  Chest x-ray showed interval worsening hazy opacification over the mid to lower right lung suggestive of moderate effusion with atelectasis at the base.  GI consult was requested for further evaluation regarding decompensated MASH cirrhosis. His wife stated he initially took his medications as prescribed post hospital discharge. He was having more than 4 loose stools 1 to 2 weeks posts discharge therefore Lactulose  was reduced to twice daily. He developed generalized weakness, felt like he had the flu 2 weeks ago with reduced appetite and he stopped taking Lactulose , Lasix  and Aldactone . His wife noticed he was more confused for the past 3 days and he had increased diarrhea with incontinence, no bloody diarrhea or black stools. No N/V. No NSAID use. No alcohol use.   He is followed by Stephane Quest NP at [facility] last seen there for/11/2023 there was no history of hepatic encephalopathy, SBP or GI bleed.  He was instructed to continue carvedilol , continue screening RUQ sonograms every 6 months and to maintain a low-sodium diet.  His most recent surveillance EGD was 04/2022 which identified grade 2 esophageal varices and moderate portal hypertensive gastropathy.  Patient was previously on carvedilol  which was discontinued following TIPS procedure.  History of hyperplastic colon polyps for colonoscopy 03/2021, repeat colonoscopy was due 03/2023 due to suboptimal bowel prep.   GI PROCEDURES:  - 03/16/2021: Colonoscopy: 4 polyps in the ascending colon (path: SSP x 1),  3 polyps in the sigmoid/rectum (path: Hyperplastic polyps).  Left-sided diverticulosis.  Medium sized internal hemorrhoids.  Fair prep.   Recommended repeat in 2 years due to suboptimal prep.  - 04/29/2022: EGD: Grade 2 esophageal varices in the middle/lower third of the esophagus, moderate portal hypertensive gastropathy, normal duodenum.    Past Medical History:  Diagnosis Date   Abnormal LFTs 09/06/2011   Acute bronchitis 08/16/2010   Anxiety 09/06/2011   Chicken pox as a child   Cirrhosis (HCC)    Cutaneous skin tags 07/27/2016   Diabetes mellitus without complication (HCC)    pt denies   Fatigue    Gout    Gout    Grief reaction 09/09/2012   Hyperglycemia 09/06/2011   Hyperlipidemia 09/06/2011   Hypertension    Insomnia 08/16/2010   Knee pain, acute 02/28/2012   Liver cirrhosis secondary to NASH (HCC)    Low testosterone  02/28/2012   Measles as a child   Mumps as a child   Nocturia 08/16/2010   Obese    Preventative health care 09/06/2011   Sleep apnea 09/06/2011   Unspecified vitamin D  deficiency 02/28/2012    Past Surgical History:  Procedure Laterality Date   BACK SURGERY     COLONOSCOPY  09/16/2010   Dr. Debrah: moderate diverticulosis sigmoid and descending colon, o/w normal: repeat 10 yrs.   intestines sewn  74 yrs old   shot once made 6 holes   IR ANGIOGRAM SELECTIVE EACH ADDITIONAL VESSEL  12/14/2023   IR EMBO ARTERIAL NOT HEMORR HEMANG INC GUIDE ROADMAPPING  12/14/2023   IR PARACENTESIS  12/22/2023   IR RADIOLOGIST EVAL & MGMT  11/15/2023   IR THORACENTESIS ASP PLEURAL SPACE W/IMG GUIDE  12/04/2023   IR THORACENTESIS ASP PLEURAL SPACE W/IMG GUIDE  12/14/2023   IR TIPS  12/14/2023   IR US  GUIDE VASC ACCESS RIGHT  12/14/2023   IR US  GUIDE VASC ACCESS RIGHT  12/14/2023   TIPS PROCEDURE N/A 12/14/2023   Procedure: INSERTION, SHUNT, INTRAHEPATIC PORTOSYSTEMIC, TRANSJUGULAR;  Surgeon: Jennefer Ester PARAS, MD;  Location: [facility] OR;  Service: Radiology;  Laterality: N/A;   TONSILLECTOMY  as a child   TONSILLECTOMY     UMBILICAL HERNIA REPAIR N/A 01/30/2023   Procedure: OPEN UMBILICAL HERNIA REPAIR;  Surgeon:  Belinda Cough, MD;  Location: [facility] OR;  Service: General;  Laterality: N/A;   VASECTOMY  74 yrs old    Prior to Admission medications  Medication Sig Start Date End Date Taking? Authorizing Provider  albuterol  (VENTOLIN  HFA) 108 (90 Base) MCG/ACT inhaler Inhale 2 puffs into the lungs every 6 (six) hours as needed for wheezing or shortness of breath. 08/22/23   Frann Mabel Mt, DO  furosemide  (LASIX ) 40 MG tablet Take 1 tablet (40 mg total) by mouth daily. 12/17/23   Kathrin Mignon DASEN, MD  lactulose  (CHRONULAC ) 10 GM/15ML solution Take 20 gm (30 ml) two to three times a day for a goal of 2-3 bowel movements a day 12/16/23   Kathrin Mignon DASEN, MD  spironolactone  (ALDACTONE ) 50 MG tablet Take 1 tablet (50 mg total) by mouth daily. 12/16/23 12/15/24  Gonfa, Taye T, MD    No current facility-administered medications for this encounter.   Current Outpatient Medications  Medication Sig Dispense Refill   albuterol  (VENTOLIN  HFA) 108 (90 Base) MCG/ACT inhaler Inhale 2 puffs into the lungs every 6 (six) hours as needed for wheezing or shortness of breath. 18 g 2   furosemide  (LASIX ) 40 MG tablet Take 1 tablet (  40 mg total) by mouth daily. 90 tablet 0   lactulose  (CHRONULAC ) 10 GM/15ML solution Take 20 gm (30 ml) two to three times a day for a goal of 2-3 bowel movements a day 2838 mL 0   spironolactone  (ALDACTONE ) 50 MG tablet Take 1 tablet (50 mg total) by mouth daily. 90 tablet 0    Allergies as of 01/16/2024   (No Known Allergies)    Family History  Problem Relation Age of Onset   Diabetes Mother        type 2   Esophageal cancer Maternal Grandmother    Cancer Maternal Grandmother        throat   Heart disease Maternal Grandfather    Diabetes Maternal Grandfather    Depression Daughter        anxiety   Graves' disease Daughter    Colon polyps Neg Hx    Colon cancer Neg Hx    Rectal cancer Neg Hx    Stomach cancer Neg Hx     Social History   Socioeconomic History   Marital status:  Married    Spouse name: Not on file   Number of children: 3   Years of education: Not on file   Highest education level: Master's degree (e.g., MA, MS, MEng, MEd, MSW, MBA)  Occupational History   Occupation: driver/partime  Tobacco Use   Smoking status: Former    Current packs/day: 0.00    Types: Cigarettes    Quit date: 08/30/1968    Years since quitting: 55.4   Smokeless tobacco: Never   Tobacco comments:    smoked in high school  Vaping Use   Vaping status: Never Used  Substance and Sexual Activity   Alcohol use: Not Currently    Comment: maybe 1 beer once a month   Drug use: No   Sexual activity: Yes    Partners: Female  Other Topics Concern   Not on file  Social History Narrative   Not on file   Social Drivers of Health   Tobacco Use: Medium Risk (01/16/2024)   Patient History    Smoking Tobacco Use: Former    Smokeless Tobacco Use: Never    Passive Exposure: Not on Actuary Strain: Low Risk (11/13/2023)   Overall Financial Resource Strain (CARDIA)    Difficulty of Paying Living Expenses: Not hard at all  Food Insecurity: No Food Insecurity (12/16/2023)   Epic    Worried About Radiation Protection Practitioner of Food in the Last Year: Never true    Ran Out of Food in the Last Year: Never true  Transportation Needs: No Transportation Needs (12/16/2023)   Epic    Lack of Transportation (Medical): No    Lack of Transportation (Non-Medical): No  Physical Activity: Inactive (11/13/2023)   Exercise Vital Sign    Days of Exercise per Week: 0 days    Minutes of Exercise per Session: Not on file  Stress: No Stress Concern Present (11/13/2023)   [employer] of Occupational Health - Occupational Stress Questionnaire    Feeling of Stress: Not at all  Social Connections: Socially Integrated (12/16/2023)   Social Connection and Isolation Panel    Frequency of Communication with Friends and Family: More than three times a week    Frequency of Social Gatherings with Friends and  Family: More than three times a week    Attends Religious Services: More than 4 times per year    Active Member of [employer] or Organizations: Yes  Attends Banker Meetings: More than 4 times per year    Marital Status: Married  Catering Manager Violence: Not At Risk (12/16/2023)   Epic    Fear of Current or Ex-Partner: No    Emotionally Abused: No    Physically Abused: No    Sexually Abused: No  Depression (PHQ2-9): Low Risk (02/28/2023)   Depression (PHQ2-9)    PHQ-2 Score: 0  Alcohol Screen: Low Risk (11/13/2023)   Alcohol Screen    Last Alcohol Screening Score (AUDIT): 1  Housing: Low Risk (12/16/2023)   Epic    Unable to Pay for Housing in the Last Year: No    Number of Times Moved in the Last Year: 0    Homeless in the Last Year: No  Utilities: Not At Risk (12/16/2023)   Epic    Threatened with loss of utilities: No  Health Literacy: Adequate Health Literacy (02/28/2023)   B1300 Health Literacy    Frequency of need for help with medical instructions: Never    Review of Systems: Limited review of systems, as patient is lethargic with altered mental status at this time.  He denies having any chest pain, shortness of breath or abdominal pain.  See HPI.   Physical Exam: Vital signs in last 24 hours: Temp:  [97.4 F (36.3 C)] 97.4 F (36.3 C) (01/06 1035) Pulse Rate:  [68-75] 70 (01/06 1300) Resp:  [15-24] 24 (01/06 1300) BP: (93-129)/(65-82) 101/67 (01/06 1300) SpO2:  [98 %-100 %] 100 % (01/06 1300) Weight:  [105 kg] 105 kg (01/06 1031)   General: Chronically ill-appearing 74 year old obese male Head:  Normocephalic and atraumatic. Eyes: Moderate scleral icterus.  Conjunctiva pink. Ears:  Normal auditory acuity. Nose:  No deformity, discharge or lesions. Mouth:  Dentition intact. No ulcers or lesions.  Neck:  Supple. No lymphadenopathy or thyromegaly.  Lungs: Breath sounds clear, decreased in the right base.  On room air. Heart: Regular rhythm, no  murmurs. Abdomen: Abdomen is distended and protuberant with ascites. Protruding umbilical hernia.  Positive bowel sounds to all 4 quadrants.  No palpable mass. Rectal: Deferred. Musculoskeletal:  Symmetrical without gross deformities.  Pulses:  Normal pulses noted. Extremities: RLE edematous > LLE, negative Homans' sign. Neurologic:  Alert to name.  Patient stated he is in the hospital and knows he is in [place].  He stated the date was [birth year] which is his birth year.  No asterixis. Skin:  Intact without significant lesions or rashes. Psych: Nonagitated.  Intake/Output from previous day: No intake/output data recorded. Intake/Output this shift: No intake/output data recorded.  Lab Results: Recent Labs    01/16/24 1108  WBC 11.9*  HGB 15.5  HCT 43.0  PLT 68*   BMET Recent Labs    01/16/24 1108  NA 133*  K 4.8  CL 98  CO2 21*  GLUCOSE 110*  BUN 63*  CREATININE 2.61*  CALCIUM  8.6*   LFT Recent Labs    01/16/24 1108  PROT 7.1  ALBUMIN  2.8*  AST 43*  ALT 25  ALKPHOS 99  BILITOT 3.8*   PT/INR No results for input(s): LABPROT, INR in the last 72 hours. Hepatitis Panel No results for input(s): HEPBSAG, HCVAB, HEPAIGM, HEPBIGM in the last 72 hours.    Studies/Results: CT HEAD WO CONTRAST Result Date: 01/16/2024 EXAM: CT HEAD WITHOUT 01/16/2024 11:25:51 AM TECHNIQUE: CT of the head was performed without the administration of intravenous contrast. Automated exposure control, iterative reconstruction, and/or weight based adjustment of the mA/kV was utilized to  reduce the radiation dose to as low as reasonably achievable. COMPARISON: None available. CLINICAL HISTORY: 74 year old male with cirrhosis, altered mental status, and confusion for 2 days. FINDINGS: BRAIN AND VENTRICLES: No acute intracranial hemorrhage. No mass effect or midline shift. No extra-axial fluid collection. No evidence of acute infarct. No hydrocephalus. Brain volume within normal limits for  age. Small but circumscribed chronic appearing infarct in the central right cerebellum on series 2 image 9. Elsewhere gray white differentiation is within normal limits for age. No suspicious intracranial vascular hyperdensity. No acute traumatic injury identified. ORBITS: No acute abnormality. SINUSES AND MASTOIDS: Incidental right maxillary alveolar recess retention cyst. Paranasal sinuses, tympanic cavities and mastoids are well aerated. SOFT TISSUES AND SKULL: No acute skull fracture. No acute soft tissue abnormality. Calcified atherosclerosis at the skull base. IMPRESSION: 1. No acute intracranial abnormality. Chronic appearing small right cerebellar infarct. Electronically signed by: Helayne Hurst MD 01/16/2024 11:57 AM EST RP Workstation: [id number]   DG Chest Portable 1 View Result Date: 01/16/2024 CLINICAL DATA:  Shortness of breath.  Altered mental status. EXAM: PORTABLE CHEST 1 VIEW COMPARISON:  01/05/2024 FINDINGS: Lungs are hypoinflated demonstrate interval worsening hazy opacification over the mid to lower right lung suggesting moderate size effusion likely with associated compressive atelectasis in the right base. Patient is slightly rotated to the left. Left lung is otherwise clear. Cardiomediastinal silhouette and remainder of the exam is unchanged. IMPRESSION: Interval worsening hazy opacification over the mid to lower right lung suggesting moderate size effusion likely with associated compressive atelectasis in the right base. Electronically Signed   By: Toribio Agreste M.D.   On: 01/16/2024 10:54    IMPRESSION/PLAN:  74 year old male with decompensated MASH cirrhosis with esophageal varices, thrombocytopenia, splenomegaly, ascites and recurrent hepatic hydrothorax s/p multiple thoracentesis. Subsequently underwent TIPS procedure per IR 12/14/2023 with right thoracocentesis and left gastric vein embolization which required hospitalization due to postprocedure hypotension, brief admission to the  ICU on pressors then discharged home 12/16/2023 on Furosemide  40 mg daily, Aldactone  50 mg daily and Lactulose  tid. S/P paracentesis per IR 12/12 or 12/29, peritoneal fluid labs were not collected therefore SBP was not ruled out. Admitted today with generalized weakness and altered mental status after stopping all medications 2 weeks ago.  No prior history of hepatic encephalopathy (patient was started on Lactulose  status post TIPS).  Ammonia level 70. - PT/INR - Agree with albumin  IV 25gm IV Q 6 hrs  - Abdominal sonogram to assess for ascites. If moderate ascites confirmed, patient will require a diagnostic paracentesis, check cell count with differential, Gram stain, aerobic/anaerobic culture, albumin , protein and cytology. - Liver Doppler to assess TIPS patency - May require repeat thoracentesis - Xifaxan  550 mg 1 capsule p.o. twice daily - Continue Lactulose  3 times daily p.o. for now, titrate to no more than 2-3 loose bowel movements daily - CBC, BMP, hepatic panel and PT/INR in a.m. - Await further recommendations per Dr. Stacia  AKI, at risk for hepatorenal syndrome. Cr 2.61 up from 1.28 on 12/19.  - Check urine sodium level - Continue IV albumin  - Hold diuretics - Await urinalysis with reflex results - Renal consult if creatinine level increases   Mild leukocytosis.  WBC count 11.9.  Afebrile.  RLE edema > LLE - Consider RLE doppler, defer to the hospitalist     Elida CHRISTELLA Shawl  01/16/2024, 3:23 PM   ------------------------------------------------------------------------------------------------------------------------------------  I have taken a history, reviewed the chart and examined the patient. I performed a substantive portion of this  encounter, including complete performance of at least one of the key components, in conjunction with the APP. I agree with the APP's note, impression and recommendations  74 year old male with history of MASH cirrhosis with  refractory ascites/hepatic hydrothorax status post TIPS on December 4, who was admitted with worsening hepatic encephalopathy in the setting of worsening generalized weakness, fatigue and acute kidney injury.  The wife states that the patient has not been compliant with dietary recommendations, and started being noncompliant with his lactulose  due to excessive diarrhea despite dose de-escalation's.  As he got weaker he stopped taking his diuretics as well.  I suspect his hepatic encephalopathy is secondary to medication noncompliance with his lactulose , but vigilance for infectious etiologies is required.  May be reasonable to obtain blood and urine cultures even no there is no obvious infection. Rule out SBP with therapeutic/diagnostic paracentesis.  It does not appear that patient was offered rifaximin .  This should be started in the hospital and continued indefinitely as outpatient.  His volume overload is almost certainly secondary to noncompliance with medications and dietary recommendations, but TIPS patency should be assessed with Doppler ultrasound.  Suspect that his acute kidney injury is secondary to poor p.o. intake and ongoing portal hypertensive physiology.  Hopefully, renal function will improve with volume challenge with scheduled IV albumin .  Will hold diuretics until meaningful renal recovery.   Iva Posten E. Stacia, MD [facility]  Moderate complex medical decision making (this includes chart review, review of results, face-to-face time used for counseling as well as treatment plan and follow-up. The patient's wife was provided an opportunity to ask questions and all were answered. The patient's wife agreed with the plan and demonstrated an understanding of the instructions    "

## 2024-01-16 NOTE — Progress Notes (Signed)
 " History and Physical    David Esparza FMW:[id number] DOB: 1950/01/29 DOA: 01/16/2024  Referring MD/NP/PA: EDP PCP:  Patient coming from: Home  Chief Complaint: Weakness, altered mental status  HPI: David Esparza is a 74/M with history of MASH, cirrhosis, esophageal varices, ascites, hepatic hydrothorax, type 2 diabetes mellitus, OSA, Bell's palsy followed by GI and pulmonary, recently underwent TIPSS procedure per interventional radiology on 12/4, subsequently was seen by pulmonary, underwent right thoracentesis, 2.4 L drained and paracentesis with 2 L drained on 12/29.  Brought to the ED by spouse due to increased weakness, altered mental status for 2 days, poor compliance with meds, poor oral intake and abdominal distention.  History obtained from chart review and speaking to friend at bedside, wife went home to bring his meds. ED Course: Vital stable, labs with WBC of 11.9, creatinine 2.6, sodium 133, albumin  2.8, ammonia 70, CT head with no acute findings, chest x-ray with moderate right pleural effusion  Review of Systems: Unable to obtain with encephalopathy  Past Medical History:  Diagnosis Date   Abnormal LFTs 09/06/2011   Acute bronchitis 08/16/2010   Anxiety 09/06/2011   Chicken pox as a child   Cirrhosis (HCC)    Cutaneous skin tags 07/27/2016   Diabetes mellitus without complication (HCC)    pt denies   Fatigue    Gout    Gout    Grief reaction 09/09/2012   Hyperglycemia 09/06/2011   Hyperlipidemia 09/06/2011   Hypertension    Insomnia 08/16/2010   Knee pain, acute 02/28/2012   Liver cirrhosis secondary to NASH (HCC)    Low testosterone  02/28/2012   Measles as a child   Mumps as a child   Nocturia 08/16/2010   Obese    Preventative health care 09/06/2011   Sleep apnea 09/06/2011   Unspecified vitamin D  deficiency 02/28/2012    Past Surgical History:  Procedure Laterality Date   BACK SURGERY     COLONOSCOPY  09/16/2010   Dr. Debrah: moderate diverticulosis  sigmoid and descending colon, o/w normal: repeat 10 yrs.   intestines sewn  74 yrs old   shot once made 6 holes   IR ANGIOGRAM SELECTIVE EACH ADDITIONAL VESSEL  12/14/2023   IR EMBO ARTERIAL NOT HEMORR HEMANG INC GUIDE ROADMAPPING  12/14/2023   IR PARACENTESIS  12/22/2023   IR RADIOLOGIST EVAL & MGMT  11/15/2023   IR THORACENTESIS ASP PLEURAL SPACE W/IMG GUIDE  12/04/2023   IR THORACENTESIS ASP PLEURAL SPACE W/IMG GUIDE  12/14/2023   IR TIPS  12/14/2023   IR US  GUIDE VASC ACCESS RIGHT  12/14/2023   IR US  GUIDE VASC ACCESS RIGHT  12/14/2023   TIPS PROCEDURE N/A 12/14/2023   Procedure: INSERTION, SHUNT, INTRAHEPATIC PORTOSYSTEMIC, TRANSJUGULAR;  Surgeon: Jennefer Ester PARAS, MD;  Location: [facility] OR;  Service: Radiology;  Laterality: N/A;   TONSILLECTOMY  as a child   TONSILLECTOMY     UMBILICAL HERNIA REPAIR N/A 01/30/2023   Procedure: OPEN UMBILICAL HERNIA REPAIR;  Surgeon: Belinda Cough, MD;  Location: [facility] OR;  Service: General;  Laterality: N/A;   VASECTOMY  74 yrs old     reports that he quit smoking about 55 years ago. His smoking use included cigarettes. He has never used smokeless tobacco. He reports that he does not currently use alcohol. He reports that he does not use drugs.  Allergies[1]  Family History  Problem Relation Age of Onset   Diabetes Mother        type 2  Esophageal cancer Maternal Grandmother    Cancer Maternal Grandmother        throat   Heart disease Maternal Grandfather    Diabetes Maternal Grandfather    Depression Daughter        anxiety   Graves' disease Daughter    Colon polyps Neg Hx    Colon cancer Neg Hx    Rectal cancer Neg Hx    Stomach cancer Neg Hx      Prior to Admission medications  Medication Sig Start Date End Date Taking? Authorizing Provider  albuterol  (VENTOLIN  HFA) 108 (90 Base) MCG/ACT inhaler Inhale 2 puffs into the lungs every 6 (six) hours as needed for wheezing or shortness of breath. 08/22/23   Frann Mabel Mt, DO  furosemide   (LASIX ) 40 MG tablet Take 1 tablet (40 mg total) by mouth daily. 12/17/23   Kathrin Mignon DASEN, MD  lactulose  (CHRONULAC ) 10 GM/15ML solution Take 20 gm (30 ml) two to three times a day for a goal of 2-3 bowel movements a day 12/16/23   Kathrin Mignon DASEN, MD  spironolactone  (ALDACTONE ) 50 MG tablet Take 1 tablet (50 mg total) by mouth daily. 12/16/23 12/15/24  Kathrin Mignon DASEN, MD    Physical Exam: Vitals:   01/16/24 1230 01/16/24 1245 01/16/24 1300 01/16/24 1330  BP: 105/75 93/78 101/67 105/71  Pulse: 70 70 70 71  Resp: 18 18 (!) 24 18  Temp:      TempSrc:      SpO2: 100% 99% 100% 100%  Weight:      Height:          Constitutional: Chronically ill, somnolent but arousable, confusion noted HEENT: Icterus and JVD Respiratory: Decreased breath sounds at the bases Cardiovascular: S1S2/RRR Abdomen: Distended, nontender, positive fluid thrill, bowel sounds present Musculoskeletal: No joint deformity upper and lower extremities. Ext: no edema Skin: no rashes Neurologic: Somnolent but arousable, confused, moves all extremities Psychiatric: Poor insight and judgment  Labs on Admission: I have personally reviewed following labs and imaging studies  CBC: Recent Labs  Lab 01/16/24 1108  WBC 11.9*  HGB 15.5  HCT 43.0  MCV 101.4*  PLT 68*   Basic Metabolic Panel: Recent Labs  Lab 01/16/24 1108  NA 133*  K 4.8  CL 98  CO2 21*  GLUCOSE 110*  BUN 63*  CREATININE 2.61*  CALCIUM  8.6*   GFR: Estimated Creatinine Clearance: 28.7 mL/min (A) (by C-G formula based on SCr of 2.61 mg/dL (H)). Liver Function Tests: Recent Labs  Lab 01/16/24 1108  AST 43*  ALT 25  ALKPHOS 99  BILITOT 3.8*  PROT 7.1  ALBUMIN  2.8*   No results for input(s): LIPASE, AMYLASE in the last 168 hours. Recent Labs  Lab 01/16/24 1108  AMMONIA 70*   Coagulation Profile: No results for input(s): INR, PROTIME in the last 168 hours. Cardiac Enzymes: No results for input(s): CKTOTAL, CKMB,  CKMBINDEX, TROPONINI in the last 168 hours. BNP (last 3 results) No results for input(s): PROBNP in the last 8760 hours. HbA1C: No results for input(s): HGBA1C in the last 72 hours. CBG: Recent Labs  Lab 01/16/24 1056  GLUCAP 109*   Lipid Profile: No results for input(s): CHOL, HDL, LDLCALC, TRIG, CHOLHDL, LDLDIRECT in the last 72 hours. Thyroid  Function Tests: No results for input(s): TSH, T4TOTAL, FREET4, T3FREE, THYROIDAB in the last 72 hours. Anemia Panel: No results for input(s): VITAMINB12, FOLATE, FERRITIN, TIBC, IRON, RETICCTPCT in the last 72 hours. Urine analysis:    Component Value Date/Time  COLORURINE YELLOW 03/08/2023 1144   APPEARANCEUR Turbid (A) 03/08/2023 1144   LABSPEC 1.025 03/08/2023 1144   PHURINE 6.0 03/08/2023 1144   GLUCOSEU NEGATIVE 03/08/2023 1144   HGBUR LARGE (A) 03/08/2023 1144   BILIRUBINUR NEGATIVE 03/08/2023 1144   BILIRUBINUR neg 02/21/2012 0819   KETONESUR NEGATIVE 03/08/2023 1144   PROTEINUR NEG 04/09/2014 1629   UROBILINOGEN 2.0 (A) 03/08/2023 1144   NITRITE NEGATIVE 03/08/2023 1144   LEUKOCYTESUR NEGATIVE 03/08/2023 1144   Sepsis Labs: @LABRCNTIP (procalcitonin:4,lacticidven:4) )No results found for this or any previous visit (from the past 240 hours).   Radiological Exams on Admission: CT HEAD WO CONTRAST Result Date: 01/16/2024 EXAM: CT HEAD WITHOUT 01/16/2024 11:25:51 AM TECHNIQUE: CT of the head was performed without the administration of intravenous contrast. Automated exposure control, iterative reconstruction, and/or weight based adjustment of the mA/kV was utilized to reduce the radiation dose to as low as reasonably achievable. COMPARISON: None available. CLINICAL HISTORY: 74 year old male with cirrhosis, altered mental status, and confusion for 2 days. FINDINGS: BRAIN AND VENTRICLES: No acute intracranial hemorrhage. No mass effect or midline shift. No extra-axial fluid collection. No  evidence of acute infarct. No hydrocephalus. Brain volume within normal limits for age. Small but circumscribed chronic appearing infarct in the central right cerebellum on series 2 image 9. Elsewhere gray white differentiation is within normal limits for age. No suspicious intracranial vascular hyperdensity. No acute traumatic injury identified. ORBITS: No acute abnormality. SINUSES AND MASTOIDS: Incidental right maxillary alveolar recess retention cyst. Paranasal sinuses, tympanic cavities and mastoids are well aerated. SOFT TISSUES AND SKULL: No acute skull fracture. No acute soft tissue abnormality. Calcified atherosclerosis at the skull base. IMPRESSION: 1. No acute intracranial abnormality. Chronic appearing small right cerebellar infarct. Electronically signed by: Helayne Hurst MD 01/16/2024 11:57 AM EST RP Workstation: [id number]   DG Chest Portable 1 View Result Date: 01/16/2024 CLINICAL DATA:  Shortness of breath.  Altered mental status. EXAM: PORTABLE CHEST 1 VIEW COMPARISON:  01/05/2024 FINDINGS: Lungs are hypoinflated demonstrate interval worsening hazy opacification over the mid to lower right lung suggesting moderate size effusion likely with associated compressive atelectasis in the right base. Patient is slightly rotated to the left. Left lung is otherwise clear. Cardiomediastinal silhouette and remainder of the exam is unchanged. IMPRESSION: Interval worsening hazy opacification over the mid to lower right lung suggesting moderate size effusion likely with associated compressive atelectasis in the right base. Electronically Signed   By: Toribio Agreste M.D.   On: 01/16/2024 10:54    Assessment/Plan  Hepatic encephalopathy (HCC) -Encephalopathy likely exacerbated by recent TIPS, questionable recent compliance with lactulose  - Add lactulose  30 mg 3 times daily - Will request GI eval - Worry about his overall prognosis, may need palliative care eval this admission depending on how kidney  function progresses  Decompensated liver cirrhosis Hepatic hydrothorax Ascites   Liver cirrhosis secondary to NASH (HCC)   S/P TIPS on 12/4 -Recent thoracentesis and paracentesis on 12/29 -Request GI consult -Start lactulose  as above, add IV albumin , -Ceftriaxone  for SBP prophylaxis -Resume diuretics soon when kidney function improves  Acute kidney injury -Likely in the setting of recent paracentesis, thoracentesis - Soft BP, - IR note from 12/29 suggest that paracentesis was aborted after 2 L drained due to soft blood pressures  DVT prophylaxis: SCDs Code Status: Full code for now, needs to be clarified with spouse Family Communication: No family at bedside, called and left message for spouse David Esparza Disposition Plan: To be determined Consults called: [facility] called by  EDP Admission status: inpt  Sigurd Pac MD [facility]   01/16/2024, 2:21 PM      [1] No Known Allergies  "

## 2024-01-16 NOTE — ED Notes (Signed)
 Patient transported to Ultrasound

## 2024-01-16 NOTE — Progress Notes (Signed)
 Patient's wife called IR today due to patient's worsening altered mental status. She stated that he'd been in bed for four days barely eating or drinking. He had become incontinent of bowel and bladder. I advised her to get David Esparza to the ED and encouraged her to call 911 for an ambulance.   Patient's current clinical information (labs, imaging, notes, etc) has been reviewed by IR. I visited with the patient while he was in ultrasound for imaging. David Esparza was awake and able to answer a few simple questions. He stated he had not been taking his medications but was unable to tell me why. I went to the ED to see his wife Reena but she had left so I spoke with a family friend who was in the room.   Our team is hopeful supportive care and medication compliance will return this patient to baseline. We are available if needed and will follow his clinical course while inpatient.   Warren Dais, AGACNP-BC 01/16/2024, 3:28 PM

## 2024-01-16 NOTE — ED Notes (Signed)
 3 failed IV attempts made by this RN.  US  recommended to primary RN.

## 2024-01-16 NOTE — ED Triage Notes (Signed)
 Pt BIB [facility] from home for AMS x 2 days.  TIPS procedure a month ago.  Has had poor compliance with meds since per wife..  Diarrhea and poor PO intake x 4-5days. Abd distention has increased over last 4-5days as well. Confused to time.  Answers to name.  Sluggish responses.  Normally A&O x4.  SBP in low to high 90's CBG 129.

## 2024-01-16 NOTE — ED Provider Notes (Signed)
 " [facility] AT [facility] Provider Note   CSN: [id number] Arrival date & time: 01/16/24  9050     Patient presents with: AMS   David Esparza is a 74 y.o. male with a past medical history significant for diabetes, hyperlipidemia, liver cirrhosis secondary to NASH who presents to the ED due to altered mental status.  Per EMS, wife notes that patient has been confused over the past 2 to 3 days.  Has been noncompliant with his medications. Had a TIPS procedure on 12/4 along with right thoracentesis and left gastric vein embolization.  Patient had a pulmonology appointment on 12/19 where patient was dealing with some fluid overload post-procedure.   Per wife, patient has had decreased PO intake over the past 4-5 days with some diarrhea likely due to lactulose . No fever.  During initial evaluation, patient denies any pain. Not able to get a full history due to AMS. Level 5 caveat.  History obtained from patient, EMS, wife and past medical records. No interpreter used during encounter.       Prior to Admission medications  Medication Sig Start Date End Date Taking? Authorizing Provider  albuterol  (VENTOLIN  HFA) 108 (90 Base) MCG/ACT inhaler Inhale 2 puffs into the lungs every 6 (six) hours as needed for wheezing or shortness of breath. 08/22/23   David Mabel Mt, DO  furosemide  (LASIX ) 40 MG tablet Take 1 tablet (40 mg total) by mouth daily. 12/17/23   David Esparza  lactulose  (CHRONULAC ) 10 GM/15ML solution Take 20 gm (30 ml) two to three times a day for a goal of 2-3 bowel movements a day 12/16/23   David Mignon DASEN, [facility]  spironolactone  (ALDACTONE ) 50 MG tablet Take 1 tablet (50 mg total) by mouth daily. 12/16/23 12/15/24  David Esparza    Allergies: Patient has no known allergies.    Review of Systems  Unable to perform ROS: Mental status change    Updated Vital Signs BP 101/67   Pulse 70   Temp (!) 97.4 F (36.3 C) (Oral)   Resp (!) 24   Ht 5'  7 (1.702 m)   Wt 105 kg   SpO2 100%   BMI 36.26 kg/m   Physical Exam Vitals and nursing note reviewed.  Constitutional:      General: He is not in acute distress.    Appearance: He is not ill-appearing.  HENT:     Head: Normocephalic.  Eyes:     General: Scleral icterus present.     Pupils: Pupils are equal, round, and reactive to light.  Cardiovascular:     Rate and Rhythm: Normal rate and regular rhythm.     Pulses: Normal pulses.     Heart sounds: Normal heart sounds. No murmur heard.    No friction rub. No gallop.  Pulmonary:     Effort: Pulmonary effort is normal.     Breath sounds: Normal breath sounds.  Abdominal:     General: Abdomen is flat. There is distension.     Palpations: Abdomen is soft.     Tenderness: There is no abdominal tenderness. There is no guarding or rebound.  Musculoskeletal:        General: Normal range of motion.     Cervical back: Neck supple.  Skin:    General: Skin is warm and dry.     Coloration: Skin is jaundiced.  Neurological:     General: No focal deficit present.     Mental  Status: He is alert.     Comments: Able to state full name. Answers year as 90. Able to move all 4 extremities  Psychiatric:        Mood and Affect: Mood normal.        Behavior: Behavior normal.     (all labs ordered are listed, but only abnormal results are displayed) Labs Reviewed  COMPREHENSIVE METABOLIC PANEL WITH GFR - Abnormal; Notable for the following components:      Result Value   Sodium 133 (*)    CO2 21 (*)    Glucose, Bld 110 (*)    BUN 63 (*)    Creatinine, Ser 2.61 (*)    Calcium  8.6 (*)    Albumin  2.8 (*)    AST 43 (*)    Total Bilirubin 3.8 (*)    GFR, Estimated 25 (*)    All other components within normal limits  CBC - Abnormal; Notable for the following components:   WBC 11.9 (*)    MCV 101.4 (*)    MCH 36.6 (*)    Platelets 68 (*)    All other components within normal limits  AMMONIA - Abnormal; Notable for the following  components:   Ammonia 70 (*)    All other components within normal limits  CBG MONITORING, ED - Abnormal; Notable for the following components:   Glucose-Capillary 109 (*)    All other components within normal limits  URINALYSIS, ROUTINE W REFLEX MICROSCOPIC    EKG: EKG Interpretation Date/Time:  Tuesday January 16 2024 10:20:22 EST Ventricular Rate:  70 PR Interval:  140 QRS Duration:  88 QT Interval:  389 QTC Calculation: 420 R Axis:   0  Text Interpretation: Sinus rhythm Low voltage, extremity and precordial leads Confirmed by David Esparza [phone]) on 01/16/2024 10:23:09 AM  Radiology: CT HEAD WO CONTRAST Result Date: 01/16/2024 EXAM: CT HEAD WITHOUT 01/16/2024 11:25:51 AM TECHNIQUE: CT of the head was performed without the administration of intravenous contrast. Automated exposure control, iterative reconstruction, and/or weight based adjustment of the mA/kV was utilized to reduce the radiation dose to as low as reasonably achievable. COMPARISON: None available. CLINICAL HISTORY: 74 year old male with cirrhosis, altered mental status, and confusion for 2 days. FINDINGS: BRAIN AND VENTRICLES: No acute intracranial hemorrhage. No mass effect or midline shift. No extra-axial fluid collection. No evidence of acute infarct. No hydrocephalus. Brain volume within normal limits for age. Small but circumscribed chronic appearing infarct in the central right cerebellum on series 2 image 9. Elsewhere gray white differentiation is within normal limits for age. No suspicious intracranial vascular hyperdensity. No acute traumatic injury identified. ORBITS: No acute abnormality. SINUSES AND MASTOIDS: Incidental right maxillary alveolar recess retention cyst. Paranasal sinuses, tympanic cavities and mastoids are well aerated. SOFT TISSUES AND SKULL: No acute skull fracture. No acute soft tissue abnormality. Calcified atherosclerosis at the skull base. IMPRESSION: 1. No acute intracranial abnormality.  Chronic appearing small right cerebellar infarct. Electronically signed by: David Hurst Esparza 01/16/2024 11:57 AM EST RP Workstation: [id number]   DG Chest Portable 1 View Result Date: 01/16/2024 CLINICAL DATA:  Shortness of breath.  Altered mental status. EXAM: PORTABLE CHEST 1 VIEW COMPARISON:  01/05/2024 FINDINGS: Lungs are hypoinflated demonstrate interval worsening hazy opacification over the mid to lower right lung suggesting moderate size effusion likely with associated compressive atelectasis in the right base. Patient is slightly rotated to the left. Left lung is otherwise clear. Cardiomediastinal silhouette and remainder of the exam is unchanged. IMPRESSION: Interval worsening hazy  opacification over the mid to lower right lung suggesting moderate size effusion likely with associated compressive atelectasis in the right base. Electronically Signed   By: Toribio Agreste M.D.   On: 01/16/2024 10:54     Procedures   Medications Ordered in the ED  lactulose  (CHRONULAC ) 10 GM/15ML solution 30 g (30 g Oral Given 01/16/24 1320)                                    Medical Decision Making Amount and/or Complexity of Data Reviewed Independent Historian: spouse and EMS External Data Reviewed: notes.    Details: Recent pulm note Labs: ordered. Decision-making details documented in ED Course. Radiology: ordered and independent interpretation performed. Decision-making details documented in ED Course. ECG/medicine tests: ordered and independent interpretation performed. Decision-making details documented in ED Course.  Risk Prescription drug management. Decision regarding hospitalization.   This patient presents to the ED for concern of AMS, this involves an extensive number of treatment options, and is a complaint that carries with it a high risk of complications and morbidity.  The differential diagnosis includes infection, electrolyte abnormality, AKI, CVA, etc  74 year old male presents to the  ED due to altered mental status.  History of cirrhosis secondary to NASH.  Recent TIPS procedure on 12/4.  Per wife, patient has had decreased p.o. intake over the past 4 to 5 days in addition to diarrhea thought to be related to lactulose .  Patient more confused over the past 2 days.  Has not been taking his medications. Upon arrival, patient afebrile, soft BP normal O2 saturation.  On exam patient believes the year is 9.  Does appear jaundice with scleral icterus.  Able to move all 4 extremities without difficulty. Low suspicion for CVA. Routine labs ordered. UA to rule out UTI. Ammonia. CT head. Patient will likely require admission. Discussed with Dr. Freddi who evaluated patient at bedside and agrees with assessment and plan.   CBC with slight leukocytosis at 11.9.  CMP with AKI with creatinine at 2.6 and BUN at 63.  Patient does appear fluid overloaded on exam.  Patient will likely need IV diuresis however, will discuss with hospitalist.  Elevated total bilirubin 3.8.  Ammonia elevated at 70 which is likely causing some of patient's altered mental status.  Lactulose  given.  CT head personally reviewed and interpreted negative for any acute abnormalities.  Does demonstrate chronic small right cerebellar infarct.  Chest x-ray with worsening hazy opacification over mid to lower right lung suggesting moderate effusion associate with compressive atelectasis in the right base.  Given acute altered mental status with elevated ammonia and AKI patient will require admission.  Co morbidities that complicate the patient evaluation  Cirrhosis secondary to NASH Cardiac Monitoring: / EKG:  The patient was maintained on a cardiac monitor.  I personally viewed and interpreted the cardiac monitored which showed an underlying rhythm of: NSR, HR 70  Social Determinants of Health:  Elderly >65  Test / Admission - Considered:  Admit for AMS  1:08 PM Discussed with Dr. Fairy with [facility] who agrees to admit  patient. Will discuss case with GI as well.   1:24 PM Discussed with Vina RIGGERS with [facility] who will see patient      Final diagnoses:  Altered mental status, unspecified altered mental status type  Increased ammonia level  AKI (acute kidney injury)    ED Discharge Orders  None          Lorelle Aleck [place], [place] 01/16/24 1326  "

## 2024-01-17 ENCOUNTER — Telehealth (HOSPITAL_COMMUNITY): Payer: Self-pay

## 2024-01-17 ENCOUNTER — Other Ambulatory Visit (HOSPITAL_COMMUNITY): Payer: Self-pay

## 2024-01-17 ENCOUNTER — Inpatient Hospital Stay (HOSPITAL_COMMUNITY)

## 2024-01-17 DIAGNOSIS — K7682 Hepatic encephalopathy: Secondary | ICD-10-CM | POA: Diagnosis not present

## 2024-01-17 DIAGNOSIS — K7681 Hepatopulmonary syndrome: Secondary | ICD-10-CM

## 2024-01-17 DIAGNOSIS — K7290 Hepatic failure, unspecified without coma: Secondary | ICD-10-CM | POA: Diagnosis not present

## 2024-01-17 DIAGNOSIS — K7460 Unspecified cirrhosis of liver: Secondary | ICD-10-CM

## 2024-01-17 DIAGNOSIS — R188 Other ascites: Secondary | ICD-10-CM

## 2024-01-17 DIAGNOSIS — N179 Acute kidney failure, unspecified: Secondary | ICD-10-CM | POA: Diagnosis not present

## 2024-01-17 LAB — COMPREHENSIVE METABOLIC PANEL WITH GFR
ALT: 18 U/L (ref 0–44)
ALT: 18 U/L (ref 0–44)
AST: 29 U/L (ref 15–41)
AST: 28 U/L (ref 15–41)
Albumin: 2.9 g/dL — ABNORMAL LOW (ref 3.5–5.0)
Albumin: 2.8 g/dL — ABNORMAL LOW (ref 3.5–5.0)
Alkaline Phosphatase: 72 U/L (ref 38–126)
Alkaline Phosphatase: 83 U/L (ref 38–126)
Anion gap: 13 (ref 5–15)
Anion gap: 12 (ref 5–15)
BUN: 65 mg/dL — ABNORMAL HIGH (ref 8–23)
BUN: 62 mg/dL — ABNORMAL HIGH (ref 8–23)
CO2: 22 mmol/L (ref 22–32)
CO2: 21 mmol/L — ABNORMAL LOW (ref 22–32)
Calcium: 8.5 mg/dL — ABNORMAL LOW (ref 8.9–10.3)
Calcium: 8.2 mg/dL — ABNORMAL LOW (ref 8.9–10.3)
Chloride: 102 mmol/L (ref 98–111)
Chloride: 103 mmol/L (ref 98–111)
Creatinine, Ser: 2.35 mg/dL — ABNORMAL HIGH (ref 0.61–1.24)
Creatinine, Ser: 2.09 mg/dL — ABNORMAL HIGH (ref 0.61–1.24)
GFR, Estimated: 28 mL/min — ABNORMAL LOW
GFR, Estimated: 33 mL/min — ABNORMAL LOW
Glucose, Bld: 128 mg/dL — ABNORMAL HIGH (ref 70–99)
Glucose, Bld: 130 mg/dL — ABNORMAL HIGH (ref 70–99)
Potassium: 4 mmol/L (ref 3.5–5.1)
Potassium: 4 mmol/L (ref 3.5–5.1)
Sodium: 137 mmol/L (ref 135–145)
Sodium: 136 mmol/L (ref 135–145)
Total Bilirubin: 3.7 mg/dL — ABNORMAL HIGH (ref 0.0–1.2)
Total Bilirubin: 3.1 mg/dL — ABNORMAL HIGH (ref 0.0–1.2)
Total Protein: 6 g/dL — ABNORMAL LOW (ref 6.5–8.1)
Total Protein: 6.1 g/dL — ABNORMAL LOW (ref 6.5–8.1)

## 2024-01-17 LAB — AMMONIA
Ammonia: 88 umol/L — ABNORMAL HIGH (ref 9–35)
Ammonia: 64 umol/L — ABNORMAL HIGH (ref 9–35)

## 2024-01-17 LAB — CBC
HCT: 34.8 % — ABNORMAL LOW (ref 39.0–52.0)
Hemoglobin: 12.5 g/dL — ABNORMAL LOW (ref 13.0–17.0)
MCH: 36.4 pg — ABNORMAL HIGH (ref 26.0–34.0)
MCHC: 35.9 g/dL (ref 30.0–36.0)
MCV: 101.5 fL — ABNORMAL HIGH (ref 80.0–100.0)
Platelets: 44 K/uL — ABNORMAL LOW (ref 150–400)
RBC: 3.43 MIL/uL — ABNORMAL LOW (ref 4.22–5.81)
RDW: 15.3 % (ref 11.5–15.5)
WBC: 7.7 K/uL (ref 4.0–10.5)
nRBC: 0 % (ref 0.0–0.2)

## 2024-01-17 LAB — PROTIME-INR
INR: 1.7 — ABNORMAL HIGH (ref 0.8–1.2)
Prothrombin Time: 20.4 s — ABNORMAL HIGH (ref 11.4–15.2)

## 2024-01-17 MED ORDER — LACTULOSE 10 GM/15ML PO SOLN: 30.0000 g | Freq: Three times a day (TID) | ORAL | Status: DC

## 2024-01-17 MED ORDER — ALBUTEROL SULFATE HFA 108 (90 BASE) MCG/ACT IN AERS: 2.0000 | INHALATION_SPRAY | Freq: Four times a day (QID) | RESPIRATORY_TRACT | Status: DC | PRN

## 2024-01-17 MED ORDER — ALBUTEROL SULFATE (2.5 MG/3ML) 0.083% IN NEBU
3.0000 mL | INHALATION_SOLUTION | Freq: Four times a day (QID) | RESPIRATORY_TRACT | Status: DC | PRN
  Administered 2024-01-17 – 2024-01-19 (×4): 3 mL via RESPIRATORY_TRACT
  Filled 2024-01-17 (×4): qty 3

## 2024-01-17 MED ORDER — LIDOCAINE-EPINEPHRINE 1 %-1:100000 IJ SOLN
INTRAMUSCULAR | Status: AC
  Filled 2024-01-17: qty 20

## 2024-01-17 MED ORDER — ALBUMIN HUMAN 25 % IV SOLN
25.0000 g | Freq: Four times a day (QID) | INTRAVENOUS | Status: AC
  Administered 2024-01-17: 25 g via INTRAVENOUS
  Administered 2024-01-17: 12.5 g via INTRAVENOUS
  Filled 2024-01-17 (×2): qty 100

## 2024-01-17 MED ORDER — HALOPERIDOL LACTATE 5 MG/ML IJ SOLN
1.0000 mg | Freq: Four times a day (QID) | INTRAMUSCULAR | Status: DC | PRN
  Administered 2024-01-17: 1 mg via INTRAVENOUS
  Filled 2024-01-17: qty 1

## 2024-01-17 NOTE — ED Notes (Signed)
 CCMD called.

## 2024-01-17 NOTE — ED Notes (Signed)
 RN provided comfort by holding pt hand

## 2024-01-17 NOTE — Plan of Care (Signed)
°  Patient is progressing towards goals of care    Problem: Education: Goal: Knowledge of General Education information will improve Description: Including pain rating scale, medication(s)/side effects and non-pharmacologic comfort measures Outcome: Progressing   Problem: Health Behavior/Discharge Planning: Goal: Ability to manage health-related needs will improve Outcome: Progressing   Problem: Clinical Measurements: Goal: Ability to maintain clinical measurements within normal limits will improve Outcome: Progressing Goal: Will remain free from infection Outcome: Progressing Goal: Diagnostic test results will improve Outcome: Progressing Goal: Respiratory complications will improve Outcome: Progressing Goal: Cardiovascular complication will be avoided Outcome: Progressing   Problem: Activity: Goal: Risk for activity intolerance will decrease Outcome: Progressing   Problem: Nutrition: Goal: Adequate nutrition will be maintained Outcome: Progressing   Problem: Coping: Goal: Level of anxiety will decrease Outcome: Progressing   Problem: Elimination: Goal: Will not experience complications related to bowel motility Outcome: Progressing Goal: Will not experience complications related to urinary retention Outcome: Progressing   Problem: Pain Managment: Goal: General experience of comfort will improve and/or be controlled Outcome: Progressing   Problem: Safety: Goal: Ability to remain free from injury will improve Outcome: Progressing   Problem: Skin Integrity: Goal: Risk for impaired skin integrity will decrease Outcome: Progressing

## 2024-01-17 NOTE — Progress Notes (Addendum)
 " PROGRESS NOTE    OMERO KOWAL  FMW:[id number] DOB: [birth year] DOA: 01/16/2024 PCP: Domenica Harlene LABOR, MD   74/M with history of MASH, cirrhosis, esophageal varices, ascites, hepatic hydrothorax, type 2 diabetes mellitus, OSA, Bell's palsy followed by GI and pulmonary, recently underwent TIPSS procedure per interventional radiology on 12/4, subsequently was seen by pulmonary, underwent right thoracentesis, 2.4 L drained and paracentesis with 2 L drained on 12/29.  Brought to the ED by spouse due to increased weakness, altered mental status for 2 days, poor compliance with meds, poor oral intake and abdominal distention. ED Course: Vital stable, labs with WBC of 11.9, creatinine 2.6, sodium 133, albumin  2.8, ammonia 70, CT head with no acute findings, chest x-ray with moderate right pleural effusion   Subjective: Patient seen in the ER, wife at bedside, bit more alert than yesterday  Assessment and Plan:  Hepatic encephalopathy (HCC) -Encephalopathy likely exacerbated by recent TIPS, questionable recent compliance with lactulose  - Continue lactulose , started on rifaximin  - Appreciate GI eval -Mental status improving, resume diet - Worry about his overall prognosis, may need palliative care eval this admission depending on how kidney function progresses -Increase activity, PT OT eval   Decompensated liver cirrhosis Hepatic hydrothorax Ascites   Liver cirrhosis secondary to NASH (HCC)   S/P TIPS on 12/4 -Recent thoracentesis and paracentesis on 12/29 -Continue lactulose , IV albumin  -Ultrasound paracentesis today, rule out SBP - Add diuretics tomorrow if kidney function improves   Acute kidney injury -Likely in the setting of recent paracentesis, thoracentesis - Soft BP, - IR note from 12/29 suggest that paracentesis was aborted after 2 L drained due to soft blood pressures - Mild improvement, continue to monitor  Thrombocytopenia Secondary to cirrhosis  DVT prophylaxis: SCDs Code  Status: Full code Family Communication: Wife at bedside Disposition Plan: May need rehab  Consultants: GI, IR   Procedures:   Antimicrobials:    Objective: Vitals:   01/17/24 0945 01/17/24 1100 01/17/24 1115 01/17/24 1136  BP: 118/80 107/78    Pulse: 78  76   Resp: 19  13   Temp:    (!) 97.5 F (36.4 C)  TempSrc:    Axillary  SpO2: 99%  99%   Weight:      Height:        Intake/Output Summary (Last 24 hours) at 01/17/2024 1149 Last data filed at 01/17/2024 1011 Gross per 24 hour  Intake --  Output 600 ml  Net -600 ml   Filed Weights   01/16/24 1031  Weight: 105 kg    Examination:  General exam: Chronically ill, laying in bed, somnolent but more arousable and interactive today HEENT: Positive icterus and JVD Respiratory system: Decreased breath sounds at the bases Cardiovascular system: S1 & S2 heard, RRR.  Abd: Distended, nontender normal bowel sounds heard. Central nervous system: Alert and oriented. No focal neurological deficits. Extremities: 1 edema Skin: No rashes Psychiatry: Poor insight    Data Reviewed:   CBC: Recent Labs  Lab 01/16/24 1108 01/17/24 0202  WBC 11.9* 7.7  HGB 15.5 12.5*  HCT 43.0 34.8*  MCV 101.4* 101.5*  PLT 68* 44*   Basic Metabolic Panel: Recent Labs  Lab 01/16/24 1108 01/17/24 0202 01/17/24 0940  NA 133* 137 136  K 4.8 4.0 4.0  CL 98 102 103  CO2 21* 22 21*  GLUCOSE 110* 128* 130*  BUN 63* 65* 62*  CREATININE 2.61* 2.35* 2.09*  CALCIUM  8.6* 8.5* 8.2*   GFR: Estimated Creatinine Clearance: 35.8  mL/min (A) (by C-G formula based on SCr of 2.09 mg/dL (H)). Liver Function Tests: Recent Labs  Lab 01/16/24 1108 01/17/24 0202 01/17/24 0940  AST 43* 29 28  ALT 25 18 18   ALKPHOS 99 72 83  BILITOT 3.8* 3.7* 3.1*  PROT 7.1 6.0* 6.1*  ALBUMIN  2.8* 2.9* 2.8*   No results for input(s): LIPASE, AMYLASE in the last 168 hours. Recent Labs  Lab 01/16/24 1108 01/17/24 0202  AMMONIA 70* 88*   Coagulation  Profile: Recent Labs  Lab 01/16/24 1633 01/17/24 0202  INR 1.5* 1.7*   Cardiac Enzymes: No results for input(s): CKTOTAL, CKMB, CKMBINDEX, TROPONINI in the last 168 hours. BNP (last 3 results) No results for input(s): PROBNP in the last 8760 hours. HbA1C: No results for input(s): HGBA1C in the last 72 hours. CBG: Recent Labs  Lab 01/16/24 1056  GLUCAP 109*   Lipid Profile: No results for input(s): CHOL, HDL, LDLCALC, TRIG, CHOLHDL, LDLDIRECT in the last 72 hours. Thyroid  Function Tests: No results for input(s): TSH, T4TOTAL, FREET4, T3FREE, THYROIDAB in the last 72 hours. Anemia Panel: No results for input(s): VITAMINB12, FOLATE, FERRITIN, TIBC, IRON, RETICCTPCT in the last 72 hours. Urine analysis:    Component Value Date/Time   COLORURINE YELLOW 01/16/2024 2158   APPEARANCEUR CLEAR 01/16/2024 2158   LABSPEC 1.014 01/16/2024 2158   PHURINE 5.0 01/16/2024 2158   GLUCOSEU NEGATIVE 01/16/2024 2158   GLUCOSEU NEGATIVE 03/08/2023 1144   HGBUR NEGATIVE 01/16/2024 2158   BILIRUBINUR NEGATIVE 01/16/2024 2158   BILIRUBINUR neg 02/21/2012 0819   KETONESUR NEGATIVE 01/16/2024 2158   PROTEINUR NEGATIVE 01/16/2024 2158   UROBILINOGEN 2.0 (A) 03/08/2023 1144   NITRITE NEGATIVE 01/16/2024 2158   LEUKOCYTESUR NEGATIVE 01/16/2024 2158   Sepsis Labs: @LABRCNTIP (procalcitonin:4,lacticidven:4)  )No results found for this or any previous visit (from the past 240 hours).   Radiology Studies: US  LIVER DOPPLER Result Date: 01/16/2024 EXAM: Ultrasound Hepatic Liver Doppler 01/16/2024 04:45:53 PM TECHNIQUE: Real-time ultrasonography of the right upper quadrant of the abdomen was performed. COMPARISON: US  Abdomen 01/16/2024 03:14 PM. CLINICAL HISTORY: Ascites. FINDINGS: LIVER: Changes of hepatic cirrhosis with diffuse hepatic parenchymal atrophy and nodular contour. No focal lesions are identified. No intrahepatic biliary ductal dilatation. Varices  are absent. BILIARY SYSTEM: Gallbladder wall thickness measures 5.5 mm. No pericholecystic fluid. No cholelithiasis. Negative sonographic Murphy's sign. The common bile duct measures 2.7 mm. VASCULAR: Portal vein:   Unable to visualize due to technical factors. Tips stent: No flow is seen in the tip of the stent on color flow doppler images. Flow is demonstrated with spectral doppler . Low amplitude venous waveforms are obtained. Proximal at portal, 36.8 cm per second. Mid, 30.6 cm per second Distal at hepatic vein, 57.2 cm per second. . IVC is patent. Hepatic veins: Right, 20.17 cm per second, hepatofugal flow. Mid, 45.6 cm per second, hepatofugal flow. Left, unable to visualize due to technical factors. Splenic vein, 34.3 cm per second. Superior mesenteric vein, unable to visualize due to technical factors. Hepatic artery, patent with normal arterial waveform. Aorta: unable to visualize due to bowel gas. OTHER: Moderate ascites is present. Small right pleural effusion. LIMITATIONS/ARTIFACTS: Examination is technically limited due to patient unable to hold breath and heavy breathing during examination resulting in motion artifact. Overlying bowel gas obscures some areas. IMPRESSION: 1. Moderate ascites. 2. Changes of hepatic cirrhosis with diffuse hepatic parenchymal atrophy and nodular contour, without focal hepatic lesion. 3. TIPS stent is patent, with low flow venous waveform obtained. Doppler evaluation limited by  technical factors. 4. Small right pleural effusion, and no sonographic evidence of varices. Electronically signed by: Elsie Gravely MD 01/16/2024 05:31 PM EST RP Workstation: [id number]   US  ASCITES (ABDOMEN LIMITED) Result Date: 01/16/2024 EXAM: LIMITED ABDOMINAL ULTRASOUND FOR ASCITES EVALUATION TECHNIQUE: Limited real-time sonography of all 4 quadrants of the abdomen was performed for evaluation of ascites. COMPARISON: US  Abdomen 06/14/2023; 01/18/2023. CLINICAL HISTORY: Ascites. FINDINGS:  RIGHT UPPER QUADRANT: Large volume ascites seen. LEFT UPPER QUADRANT: Large volume ascites seen. RIGHT LOWER QUADRANT: Large volume ascites seen. LEFT LOWER QUADRANT: Large volume ascites seen. OTHER: Limited visualization of the rest of the abdomen demonstrates no acute abnormality. IMPRESSION: 1. Large volume ascites Electronically signed by: Rogelia Myers MD 01/16/2024 04:54 PM EST RP Workstation: [id number]   CT HEAD WO CONTRAST Result Date: 01/16/2024 EXAM: CT HEAD WITHOUT 01/16/2024 11:25:51 AM TECHNIQUE: CT of the head was performed without the administration of intravenous contrast. Automated exposure control, iterative reconstruction, and/or weight based adjustment of the mA/kV was utilized to reduce the radiation dose to as low as reasonably achievable. COMPARISON: None available. CLINICAL HISTORY: 74 year old male with cirrhosis, altered mental status, and confusion for 2 days. FINDINGS: BRAIN AND VENTRICLES: No acute intracranial hemorrhage. No mass effect or midline shift. No extra-axial fluid collection. No evidence of acute infarct. No hydrocephalus. Brain volume within normal limits for age. Small but circumscribed chronic appearing infarct in the central right cerebellum on series 2 image 9. Elsewhere gray white differentiation is within normal limits for age. No suspicious intracranial vascular hyperdensity. No acute traumatic injury identified. ORBITS: No acute abnormality. SINUSES AND MASTOIDS: Incidental right maxillary alveolar recess retention cyst. Paranasal sinuses, tympanic cavities and mastoids are well aerated. SOFT TISSUES AND SKULL: No acute skull fracture. No acute soft tissue abnormality. Calcified atherosclerosis at the skull base. IMPRESSION: 1. No acute intracranial abnormality. Chronic appearing small right cerebellar infarct. Electronically signed by: Helayne Hurst MD 01/16/2024 11:57 AM EST RP Workstation: [id number]   DG Chest Portable 1 View Result Date: 01/16/2024 CLINICAL  DATA:  Shortness of breath.  Altered mental status. EXAM: PORTABLE CHEST 1 VIEW COMPARISON:  01/05/2024 FINDINGS: Lungs are hypoinflated demonstrate interval worsening hazy opacification over the mid to lower right lung suggesting moderate size effusion likely with associated compressive atelectasis in the right base. Patient is slightly rotated to the left. Left lung is otherwise clear. Cardiomediastinal silhouette and remainder of the exam is unchanged. IMPRESSION: Interval worsening hazy opacification over the mid to lower right lung suggesting moderate size effusion likely with associated compressive atelectasis in the right base. Electronically Signed   By: Toribio Agreste M.D.   On: 01/16/2024 10:54     Scheduled Meds:  lactulose   30 g Oral TID   rifaximin   550 mg Oral BID   Continuous Infusions:  albumin  human 25 g (01/17/24 1011)     LOS: 1 day    Time spent:    Sigurd Pac, MD [facility]   01/17/2024, 11:49 AM    "

## 2024-01-17 NOTE — Progress Notes (Signed)
" °   01/17/24 1345  Vitals  Temp 98.9 F (37.2 C)  Temp Source Axillary  BP 100/73  MAP (mmHg) 83  BP Location Left Arm  BP Method Automatic  Patient Position (if appropriate) Lying  Pulse Rate 71  Pulse Rate Source Monitor  ECG Heart Rate 73  Resp 12  Level of Consciousness  Level of Consciousness Responds to Voice  MEWS COLOR  MEWS Score Color Yellow  Oxygen Therapy  SpO2 98 %  O2 Device Room Air  Pain Assessment  Pain Scale 0-10  Pain Score 0  MEWS Score  MEWS Temp 0  MEWS Systolic 1  MEWS Pulse 0  MEWS RR 1  MEWS LOC 1  MEWS Score 3   Patient just arrived from ED  "

## 2024-01-17 NOTE — Progress Notes (Signed)
 [facility] ROUNDING NOTE   Subjective: Patient or alert and interactive today compared to yesterday.  He knew that he was at [facility] was not oriented to the date.  Attempt at paracentesis yesterday unsuccessful due to lack of adequate fluid pocket.  Serum creatinine improving, ammonia slightly improving.  Patient not able to say how many bowel movements he had, no documentation of bowel movements in chart.   Objective: Vital signs in last 24 hours: Temp:  [97.3 F (36.3 C)-98.9 F (37.2 C)] 97.3 F (36.3 C) (01/07 1626) Pulse Rate:  [65-87] 81 (01/07 1626) Resp:  [11-29] 17 (01/07 1626) BP: (97-119)/(61-82) 114/61 (01/07 1626) SpO2:  [83 %-100 %] 96 % (01/07 1626) Last BM Date : 01/17/24 General: NAD, elderly chronically ill appearing Caucasian male, no family at bedside at time of my visit Lungs: Slightly increased work of breathing, but patient denies dyspnea, decreased breath sounds at bases Heart:  RRR, no m/r/g Abdomen:  Soft, NT, ND, +BS Ext: Trace bilateral pitting edema    Intake/Output from previous day: No intake/output data recorded. Intake/Output this shift: No intake/output data recorded.   Lab Results: Recent Labs    01/16/24 1108 01/17/24 0202  WBC 11.9* 7.7  HGB 15.5 12.5*  PLT 68* 44*  MCV 101.4* 101.5*   BMET Recent Labs    01/16/24 1108 01/17/24 0202 01/17/24 0940  NA 133* 137 136  K 4.8 4.0 4.0  CL 98 102 103  CO2 21* 22 21*  GLUCOSE 110* 128* 130*  BUN 63* 65* 62*  CREATININE 2.61* 2.35* 2.09*  CALCIUM  8.6* 8.5* 8.2*   LFT Recent Labs    01/16/24 1108 01/17/24 0202 01/17/24 0940  PROT 7.1 6.0* 6.1*  ALBUMIN  2.8* 2.9* 2.8*  AST 43* 29 28  ALT 25 18 18   ALKPHOS 99 72 83  BILITOT 3.8* 3.7* 3.1*   PT/INR Recent Labs    01/16/24 1633 01/17/24 0202  INR 1.5* 1.7*      Imaging/Other results: IR ABDOMEN US  LIMITED Result Date: 01/17/2024 INDICATION: 74 year old male with history of MASH and  cirrhosis with ascites. History of TIPS procedure performed on 12/14/2023. Received request for diagnostic and therapeutic paracentesis. EXAM: ULTRASOUND ABDOMEN LIMITED FINDINGS: All four abdominal quadrants were examined. There was no significant pocket of fluid to allow for safe approach for paracentesis. IMPRESSION: All four abdominal quadrants were examined. There was no significant pocket of fluid to allow for safe approach for paracentesis. No procedure performed. Read by: Rayfield Buff, NP under the supervision of Dr. Cordella Banner Electronically Signed   By: Cordella Banner   On: 01/17/2024 13:37   US  LIVER DOPPLER Result Date: 01/16/2024 EXAM: Ultrasound Hepatic Liver Doppler 01/16/2024 04:45:53 PM TECHNIQUE: Real-time ultrasonography of the right upper quadrant of the abdomen was performed. COMPARISON: US  Abdomen 01/16/2024 03:14 PM. CLINICAL HISTORY: Ascites. FINDINGS: LIVER: Changes of hepatic cirrhosis with diffuse hepatic parenchymal atrophy and nodular contour. No focal lesions are identified. No intrahepatic biliary ductal dilatation. Varices are absent. BILIARY SYSTEM: Gallbladder wall thickness measures 5.5 mm. No pericholecystic fluid. No cholelithiasis. Negative sonographic Murphy's sign. The common bile duct measures 2.7 mm. VASCULAR: Portal vein:   Unable to visualize due to technical factors. Tips stent: No flow is seen in the tip of the stent on color flow doppler images. Flow is demonstrated with spectral doppler . Low amplitude venous waveforms are obtained. Proximal at portal, 36.8 cm per second. Mid, 30.6 cm per second Distal at hepatic vein, 57.2  cm per second. . IVC is patent. Hepatic veins: Right, 20.17 cm per second, hepatofugal flow. Mid, 45.6 cm per second, hepatofugal flow. Left, unable to visualize due to technical factors. Splenic vein, 34.3 cm per second. Superior mesenteric vein, unable to visualize due to technical factors. Hepatic artery, patent with normal arterial  waveform. Aorta: unable to visualize due to bowel gas. OTHER: Moderate ascites is present. Small right pleural effusion. LIMITATIONS/ARTIFACTS: Examination is technically limited due to patient unable to hold breath and heavy breathing during examination resulting in motion artifact. Overlying bowel gas obscures some areas. IMPRESSION: 1. Moderate ascites. 2. Changes of hepatic cirrhosis with diffuse hepatic parenchymal atrophy and nodular contour, without focal hepatic lesion. 3. TIPS stent is patent, with low flow venous waveform obtained. Doppler evaluation limited by technical factors. 4. Small right pleural effusion, and no sonographic evidence of varices. Electronically signed by: Elsie Gravely MD 01/16/2024 05:31 PM EST RP Workstation: [id number]   US  ASCITES (ABDOMEN LIMITED) Result Date: 01/16/2024 EXAM: LIMITED ABDOMINAL ULTRASOUND FOR ASCITES EVALUATION TECHNIQUE: Limited real-time sonography of all 4 quadrants of the abdomen was performed for evaluation of ascites. COMPARISON: US  Abdomen 06/14/2023; 01/18/2023. CLINICAL HISTORY: Ascites. FINDINGS: RIGHT UPPER QUADRANT: Large volume ascites seen. LEFT UPPER QUADRANT: Large volume ascites seen. RIGHT LOWER QUADRANT: Large volume ascites seen. LEFT LOWER QUADRANT: Large volume ascites seen. OTHER: Limited visualization of the rest of the abdomen demonstrates no acute abnormality. IMPRESSION: 1. Large volume ascites Electronically signed by: Rogelia Myers MD 01/16/2024 04:54 PM EST RP Workstation: [id number]   CT HEAD WO CONTRAST Result Date: 01/16/2024 EXAM: CT HEAD WITHOUT 01/16/2024 11:25:51 AM TECHNIQUE: CT of the head was performed without the administration of intravenous contrast. Automated exposure control, iterative reconstruction, and/or weight based adjustment of the mA/kV was utilized to reduce the radiation dose to as low as reasonably achievable. COMPARISON: None available. CLINICAL HISTORY: 75 year old male with cirrhosis, altered  mental status, and confusion for 2 days. FINDINGS: BRAIN AND VENTRICLES: No acute intracranial hemorrhage. No mass effect or midline shift. No extra-axial fluid collection. No evidence of acute infarct. No hydrocephalus. Brain volume within normal limits for age. Small but circumscribed chronic appearing infarct in the central right cerebellum on series 2 image 9. Elsewhere gray white differentiation is within normal limits for age. No suspicious intracranial vascular hyperdensity. No acute traumatic injury identified. ORBITS: No acute abnormality. SINUSES AND MASTOIDS: Incidental right maxillary alveolar recess retention cyst. Paranasal sinuses, tympanic cavities and mastoids are well aerated. SOFT TISSUES AND SKULL: No acute skull fracture. No acute soft tissue abnormality. Calcified atherosclerosis at the skull base. IMPRESSION: 1. No acute intracranial abnormality. Chronic appearing small right cerebellar infarct. Electronically signed by: Helayne Hurst MD 01/16/2024 11:57 AM EST RP Workstation: [id number]   DG Chest Portable 1 View Result Date: 01/16/2024 CLINICAL DATA:  Shortness of breath.  Altered mental status. EXAM: PORTABLE CHEST 1 VIEW COMPARISON:  01/05/2024 FINDINGS: Lungs are hypoinflated demonstrate interval worsening hazy opacification over the mid to lower right lung suggesting moderate size effusion likely with associated compressive atelectasis in the right base. Patient is slightly rotated to the left. Left lung is otherwise clear. Cardiomediastinal silhouette and remainder of the exam is unchanged. IMPRESSION: Interval worsening hazy opacification over the mid to lower right lung suggesting moderate size effusion likely with associated compressive atelectasis in the right base. Electronically Signed   By: Toribio Agreste M.D.   On: 01/16/2024 10:54   MELD 3.0: 25 at 01/17/2024  9:40 AM MELD-Na: 25 at 01/17/2024  9:40 AM Calculated from: Serum Creatinine: 2.09 mg/dL at 08/12/7971  0:59 AM Serum  Sodium: 136 mmol/L at 01/17/2024  9:40 AM Total Bilirubin: 3.1 mg/dL at 08/12/7971  0:59 AM Serum Albumin : 2.8 g/dL at 08/12/7971  0:59 AM INR(ratio): 1.7 at 01/17/2024  2:02 AM Age at listing (hypothetical): 7 years Sex: Male at 01/17/2024  9:40 AM     Assessment and Plan:  74 year old male with decompensated MASH cirrhosis with refractory ascites and hepatic hydrothorax status post TIPS December 4, admitted with encephalopathy, generalized weakness and acute kidney injury.  Hepatic encephalopathy At increased risk status post TIPS, compounded by medication nonadherence with lactulose  at home - Slowly improving with Xifaxan  and lactulose  - Continue Xifaxan  550 mg twice daily.  Would recommend he stay on this indefinitely - Continue lactulose  3 times daily for now.  Bowel movements should be documented in the chart to help guide therapy.   Acute kidney injury Creatinine 2.6 on admission from baseline 1.2.  Improving with albumin  - Continue scheduled IV albumin  25 g every 6 hours for now, may be able to de-escalate tomorrow - Holding diuretics  Decompensated cirrhosis with refractory ascites/hepatic hydrothorax MELD 25 Overall poor prognosis - TIPS patent on Doppler ultrasound - Expect it will continue to improve his fluid status and reduce risk of variceal bleed, but hepatic encephalopathy will continue to be an issue, particularly if he is not adherent with medications   Glendia FORBES Holt, MD  01/17/2024, 8:34 PM [facility]  Moderate complex medical decision making (this includes chart review, review of results, face-to-face time used for counseling as well as treatment plan and follow-up.

## 2024-01-17 NOTE — ED Notes (Signed)
RN adjusted pt BP cuff.

## 2024-01-17 NOTE — Telephone Encounter (Signed)
 Pharmacy Patient Advocate Encounter  Received notification from [employer] that Prior Authorization for Xifaxan  550MG  tablets has been APPROVED from 01/17/24 to 07/15/24. Ran test claim, Copay is [phone] going towards deductible). This test claim was processed through [facility]- copay amounts may vary at other pharmacies due to pharmacy/plan contracts, or as the patient moves through the different stages of their insurance plan.   PA #/Case ID/Reference #: [id number]

## 2024-01-17 NOTE — Progress Notes (Signed)
 Called patient's wife, Reena, and completed admission profile with her assistance.

## 2024-01-17 NOTE — ED Notes (Signed)
 Pt yelled from room. RN approached pt and pt requested RN to hold his hand. Pt then stated he needs a Xanax . RN notified attending.

## 2024-01-17 NOTE — Progress Notes (Addendum)
 Patient's wife reached out to [facility] directly to inquire about hospice at home. Inocente with [facility] was able to meet with patient, wife, and daughter in room today.   Patient's wife has decided she wants a PT/OT assessment before pursuing hospice. PT/OT ordered to see patient.   [facility] reports they will f/u tomorrow.   CM will continue to follow.

## 2024-01-17 NOTE — Progress Notes (Signed)
 I was present during limited ultrasound of the abdomen prior to possible paracentesis.   All four abdominal quadrants were examined. There was no significant pocket of fluid to allow for safe approach for paracentesis. No procedure performed.   Ray Glacken B Talik Casique NP 01/17/2024 12:49 PM

## 2024-01-18 ENCOUNTER — Inpatient Hospital Stay (HOSPITAL_COMMUNITY)

## 2024-01-18 DIAGNOSIS — N179 Acute kidney failure, unspecified: Secondary | ICD-10-CM | POA: Diagnosis not present

## 2024-01-18 DIAGNOSIS — Z95828 Presence of other vascular implants and grafts: Secondary | ICD-10-CM | POA: Diagnosis not present

## 2024-01-18 DIAGNOSIS — K7581 Nonalcoholic steatohepatitis (NASH): Secondary | ICD-10-CM | POA: Diagnosis not present

## 2024-01-18 DIAGNOSIS — K7682 Hepatic encephalopathy: Secondary | ICD-10-CM | POA: Diagnosis not present

## 2024-01-18 DIAGNOSIS — K7469 Other cirrhosis of liver: Secondary | ICD-10-CM | POA: Diagnosis not present

## 2024-01-18 LAB — COMPREHENSIVE METABOLIC PANEL WITH GFR
ALT: 16 U/L (ref 0–44)
AST: 25 U/L (ref 15–41)
Albumin: 3 g/dL — ABNORMAL LOW (ref 3.5–5.0)
Alkaline Phosphatase: 67 U/L (ref 38–126)
Anion gap: 11 (ref 5–15)
BUN: 54 mg/dL — ABNORMAL HIGH (ref 8–23)
CO2: 23 mmol/L (ref 22–32)
Calcium: 8.3 mg/dL — ABNORMAL LOW (ref 8.9–10.3)
Chloride: 105 mmol/L (ref 98–111)
Creatinine, Ser: 1.93 mg/dL — ABNORMAL HIGH (ref 0.61–1.24)
GFR, Estimated: 36 mL/min — ABNORMAL LOW
Glucose, Bld: 123 mg/dL — ABNORMAL HIGH (ref 70–99)
Potassium: 4 mmol/L (ref 3.5–5.1)
Sodium: 139 mmol/L (ref 135–145)
Total Bilirubin: 3.1 mg/dL — ABNORMAL HIGH (ref 0.0–1.2)
Total Protein: 5.8 g/dL — ABNORMAL LOW (ref 6.5–8.1)

## 2024-01-18 LAB — CBC
HCT: 32.8 % — ABNORMAL LOW (ref 39.0–52.0)
Hemoglobin: 11.7 g/dL — ABNORMAL LOW (ref 13.0–17.0)
MCH: 36.1 pg — ABNORMAL HIGH (ref 26.0–34.0)
MCHC: 35.7 g/dL (ref 30.0–36.0)
MCV: 101.2 fL — ABNORMAL HIGH (ref 80.0–100.0)
Platelets: 41 K/uL — ABNORMAL LOW (ref 150–400)
RBC: 3.24 MIL/uL — ABNORMAL LOW (ref 4.22–5.81)
RDW: 15.2 % (ref 11.5–15.5)
WBC: 6.8 K/uL (ref 4.0–10.5)
nRBC: 0 % (ref 0.0–0.2)

## 2024-01-18 MED ORDER — ALBUMIN HUMAN 25 % IV SOLN
25.0000 g | Freq: Four times a day (QID) | INTRAVENOUS | Status: AC
  Administered 2024-01-18: 12.5 g via INTRAVENOUS
  Administered 2024-01-18 – 2024-01-19 (×3): 25 g via INTRAVENOUS
  Filled 2024-01-18 (×4): qty 100

## 2024-01-18 MED ORDER — MIDODRINE HCL 5 MG PO TABS
5.0000 mg | ORAL_TABLET | Freq: Three times a day (TID) | ORAL | Status: DC
  Administered 2024-01-18: 5 mg via ORAL
  Filled 2024-01-18: qty 1

## 2024-01-18 NOTE — Evaluation (Signed)
 Physical Therapy Evaluation Patient Details Name: David Esparza MRN: [id number] DOB: December 26, 1950 Today's Date: 01/18/2024  History of Present Illness  Pt is a 74 y/o M who presented to [facility] ED 01/16/24 for AMS x2-3 days. Wife notes pt has been confused recently and non-compliant with his medications. Had a TIPS procedure on 12/4 along with right thoracentesis and left gastric vein embolization.  Patient had a pulmonology appointment on 12/19 where patient was dealing with some fluid overload post-procedure. PMHx: DM, HLD, liver cirrhosis secondary to NASH.   Clinical Impression  Pt is currently mobilizing below his baseline due to fatigue and balance deficits. Pt with recent altered mental status that seems to be improving and did not limit mobility at this time. Pt is CGA for bed mobility with good sitting balance EOB. Pt also CGA for STS and short distance ambulation. Increased postural sway noted in standing but no LOB occurs. Pt reaches for nearby structures for additional balance support, states he does not do this at home and does not use DME. Pt tolerating modified household distances at this time due to fatigue, able to ambulate to the bathroom. Pt demonstrates good static stance during peri care. Pt would benefit from continued PT services focused on balance, gait, and activity tolerance to promote safety and independence with functional mobility.        If plan is discharge home, recommend the following: A little help with walking and/or transfers;A little help with bathing/dressing/bathroom;Assistance with cooking/housework;Assist for transportation;Help with stairs or ramp for entrance;Direct supervision/assist for medications management;Direct supervision/assist for financial management;Supervision due to cognitive status   Can travel by private vehicle        Equipment Recommendations Cane;Rolling walker (2 wheels);BSC/3in1  Recommendations for Other Services       Functional Status  Assessment Patient has had a recent decline in their functional status and demonstrates the ability to make significant improvements in function in a reasonable and predictable amount of time.     Precautions / Restrictions Precautions Precautions: Fall Recall of Precautions/Restrictions: Intact Restrictions Weight Bearing Restrictions Per Provider Order: No      Mobility  Bed Mobility Overal bed mobility: Needs Assistance Bed Mobility: Supine to Sit, Sit to Supine     Supine to sit: Contact guard Sit to supine: Contact guard assist   General bed mobility comments: Uses bed rails as needed, repositions self EOB, exits to the L. Returned to bed due to test scheduled soon.    Transfers Overall transfer level: Needs assistance Equipment used: None Transfers: Sit to/from Stand Sit to Stand: Contact guard assist           General transfer comment: Forward lean noted throughout STS transfer but is able to bring trunk upright upon full stance. Slightly unsteady in standing, improves with time but benefits from guarding. Able to stand from standard toilet height with assist from grab bar.    Ambulation/Gait Ambulation/Gait assistance: Contact guard assist Gait Distance (Feet): 10 Feet (10'x2) Assistive device: None Gait Pattern/deviations: Step-through pattern, Decreased step length - right, Decreased step length - left, Narrow base of support   Gait velocity interpretation: <1.31 ft/sec, indicative of household ambulator   General Gait Details: Pt noted to occasionally reach out of wall or sink surface for support, states he does not do this at home. Tolerating short distances at this time due to fatigue. Could benefit from cane for balance, does not use at baseline.  Stairs  Wheelchair Mobility     Tilt Bed    Modified Rankin (Stroke Patients Only)       Balance Overall balance assessment: Needs assistance Sitting-balance support: No upper  extremity supported, Feet supported Sitting balance-Leahy Scale: Good Sitting balance - Comments: seated EOB, no sitting balance concerns   Standing balance support: Single extremity supported, No upper extremity supported, During functional activity Standing balance-Leahy Scale: Fair Standing balance comment: Reaches for nearby structures to steady self, increased sway in standing, no overt LOB noted. Performs peri care with set up assist in standing.                             Pertinent Vitals/Pain Pain Assessment Pain Assessment: No/denies pain    Home Living Family/patient expects to be discharged to:: Private residence Living Arrangements: Spouse/significant other Available Help at Discharge: Family;Available 24 hours/day Type of Home: House Home Access: Level entry       Home Layout: One level Home Equipment: None Additional Comments: Cognition improving but may benefit to conform equipment and PLOF with wife if present due to certain responses not matching what is observed.    Prior Function Prior Level of Function : Independent/Modified Independent             Mobility Comments: Pt reports ambulating without DME ADLs Comments: Pt states he is independent with ADLs then requests OT dons socks for him.     Extremity/Trunk Assessment   Upper Extremity Assessment Upper Extremity Assessment: Generalized weakness    Lower Extremity Assessment Lower Extremity Assessment: Defer to PT evaluation    Cervical / Trunk Assessment Cervical / Trunk Assessment: Normal  Communication   Communication Communication: No apparent difficulties    Cognition Arousal: Alert Behavior During Therapy: WFL for tasks assessed/performed   PT - Cognitive impairments: Awareness, Problem solving, Safety/Judgement                       PT - Cognition Comments: Admitted with AMS, answers orientation questions correctly and follows cues appropriately during  session. Following commands: Intact Following commands impaired: Follows one step commands with increased time     Cueing Cueing Techniques: Verbal cues, Tactile cues, Visual cues     General Comments General comments (skin integrity, edema, etc.): VSS throughout. No significant skin abnormalities noted.    Exercises     Assessment/Plan    PT Assessment Patient needs continued PT services  PT Problem List Decreased strength;Decreased mobility;Decreased safety awareness;Decreased coordination;Decreased knowledge of precautions;Decreased activity tolerance;Decreased cognition;Decreased balance;Decreased knowledge of use of DME       PT Treatment Interventions DME instruction;Therapeutic exercise;Gait training;Balance training;Stair training;Neuromuscular re-education;Functional mobility training;Therapeutic activities;Patient/family education;Cognitive remediation    PT Goals (Current goals can be found in the Care Plan section)  Acute Rehab PT Goals Patient Stated Goal: None stated    Frequency Min 1X/week     Co-evaluation PT/OT/SLP Co-Evaluation/Treatment: Yes Reason for Co-Treatment: Complexity of the patient's impairments (multi-system involvement);To address functional/ADL transfers PT goals addressed during session: Mobility/safety with mobility;Balance OT goals addressed during session: ADL's and self-care       AM-PAC PT 6 Clicks Mobility  Outcome Measure Help needed turning from your back to your side while in a flat bed without using bedrails?: None Help needed moving from lying on your back to sitting on the side of a flat bed without using bedrails?: None Help needed moving to and from a bed to  a chair (including a wheelchair)?: A Little Help needed standing up from a chair using your arms (e.g., wheelchair or bedside chair)?: A Little Help needed to walk in hospital room?: A Little Help needed climbing 3-5 steps with a railing? : A Lot 6 Click Score: 19     End of Session   Activity Tolerance: Patient limited by fatigue Patient left: in bed;with call bell/phone within reach;with bed alarm set Nurse Communication: Mobility status PT Visit Diagnosis: Unsteadiness on feet (R26.81);Muscle weakness (generalized) (M62.81)    Time: 9159-9097 PT Time Calculation (min) (ACUTE ONLY): 22 min   Charges:   PT Evaluation $PT Eval Moderate Complexity: 1 Mod   PT General Charges $$ ACUTE PT VISIT: 1 Visit         Sabra Morel, PT, DPT  Acute Rehabilitation Services         Office: [phone]     Sabra MARLA Morel 01/18/2024, 3:33 PM

## 2024-01-18 NOTE — Plan of Care (Signed)

## 2024-01-18 NOTE — Progress Notes (Addendum)
 Patient ID: David Esparza, male   DOB: [birth year], 74 y.o.   MRN: [id number]  PROGRESS NOTE    David Esparza  FMW:[id number] DOB: December 15, 1950 DOA: 01/16/2024 PCP: Domenica Harlene LABOR, MD (Confirm with patient/family/NH records and if not entered, this HAS to be entered at [facility] point of entry. No PCP if truly none.)   Chief Complaint  Patient presents with   AMS    Brief Narrative:   David Esparza is a 74 year old male with past medical history of hypertension, cirrhosis secondary to NASH, and hyperlipidemia. He presented to the ED with altered mental status x 2 days and abdominal distention x 5 days. He was found to have hepatic encephalopathy.    Assessment & Plan: Hepatic Encephalopathy  Secondary to high ammonia levels.  Improved from IV albumin , rifaximin , and lactulose .  Continue monitoring.   Liver Cirrhosis Secondary to NASH TIPS procedure 12/14/23  Monitor  Acute Kidney Injury  Awaiting bladder scan, US  renal, UA protein.  Improved creatinine.     DVT prophylaxis: SCD Code Status: Full  Family Communication: none at bedside Disposition:   Status is: Inpatient Remains inpatient appropriate because: severity of illness    Consultants:   Procedures:   Antimicrobials: rifaximin  tablet 550 mg    Subjective: Patient still has labored breathing but not as severe. A&O x 4.   Objective: Vitals:   01/17/24 2323 01/18/24 0000 01/18/24 0054 01/18/24 0326  BP:   115/75 101/80  Pulse:  84  78  Resp:  18  13  Temp:   98.3 F (36.8 C) 98 F (36.7 C)  TempSrc:   Axillary Axillary  SpO2: 97% 93%  93%  Weight:      Height:        Intake/Output Summary (Last 24 hours) at 01/18/2024 0959 Last data filed at 01/18/2024 0327 Gross per 24 hour  Intake 82.94 ml  Output 1450 ml  Net -1367.06 ml   Filed Weights   01/16/24 1031  Weight: 105 kg    Examination:  General exam: Appears calm and comfortable  Respiratory system: Crackles on auscultation. Respiratory effort  labored. Cardiovascular system: S1 & S2 heard, RRR. No JVD, murmurs, rubs, gallops or clicks. No pedal edema. Gastrointestinal system: Abdomen is nondistended, soft and nontender. No organomegaly or masses felt. Normal bowel sounds heard. Central nervous system: Alert and oriented. No focal neurological deficits. Extremities: Symmetric 5 x 5 power. Skin: No rashes, lesions or ulcers Psychiatry: Judgement and insight appear normal. Mood & affect appropriate.     Data Reviewed: I have personally reviewed following labs and imaging studies.  CBC: Recent Labs  Lab 01/16/24 1108 01/17/24 0202 01/18/24 0358  WBC 11.9* 7.7 6.8  HGB 15.5 12.5* 11.7*  HCT 43.0 34.8* 32.8*  MCV 101.4* 101.5* 101.2*  PLT 68* 44* 41*    Basic Metabolic Panel: Recent Labs  Lab 01/16/24 1108 01/17/24 0202 01/17/24 0940 01/18/24 0358  NA 133* 137 136 139  K 4.8 4.0 4.0 4.0  CL 98 102 103 105  CO2 21* 22 21* 23  GLUCOSE 110* 128* 130* 123*  BUN 63* 65* 62* 54*  CREATININE 2.61* 2.35* 2.09* 1.93*  CALCIUM  8.6* 8.5* 8.2* 8.3*    GFR: Estimated Creatinine Clearance: 38.8 mL/min (A) (by C-G formula based on SCr of 1.93 mg/dL (H)).  Liver Function Tests: Recent Labs  Lab 01/16/24 1108 01/17/24 0202 01/17/24 0940 01/18/24 0358  AST 43* 29 28 25   ALT 25 18 18  16  ALKPHOS 99 72 83 67  BILITOT 3.8* 3.7* 3.1* 3.1*  PROT 7.1 6.0* 6.1* 5.8*  ALBUMIN  2.8* 2.9* 2.8* 3.0*    CBG: Recent Labs  Lab 01/16/24 1056  GLUCAP 109*     No results found for this or any previous visit (from the past 240 hours).     Radiology Studies: IR ABDOMEN US  LIMITED Result Date: 01/17/2024 INDICATION: 74 year old male with history of MASH and cirrhosis with ascites. History of TIPS procedure performed on 12/14/2023. Received request for diagnostic and therapeutic paracentesis. EXAM: ULTRASOUND ABDOMEN LIMITED FINDINGS: All four abdominal quadrants were examined. There was no significant pocket of fluid to allow  for safe approach for paracentesis. IMPRESSION: All four abdominal quadrants were examined. There was no significant pocket of fluid to allow for safe approach for paracentesis. No procedure performed. Read by: Rayfield Buff, NP under the supervision of Dr. Cordella Banner Electronically Signed   By: Cordella Banner   On: 01/17/2024 13:37   US  LIVER DOPPLER Result Date: 01/16/2024 EXAM: Ultrasound Hepatic Liver Doppler 01/16/2024 04:45:53 PM TECHNIQUE: Real-time ultrasonography of the right upper quadrant of the abdomen was performed. COMPARISON: US  Abdomen 01/16/2024 03:14 PM. CLINICAL HISTORY: Ascites. FINDINGS: LIVER: Changes of hepatic cirrhosis with diffuse hepatic parenchymal atrophy and nodular contour. No focal lesions are identified. No intrahepatic biliary ductal dilatation. Varices are absent. BILIARY SYSTEM: Gallbladder wall thickness measures 5.5 mm. No pericholecystic fluid. No cholelithiasis. Negative sonographic Murphy's sign. The common bile duct measures 2.7 mm. VASCULAR: Portal vein:   Unable to visualize due to technical factors. Tips stent: No flow is seen in the tip of the stent on color flow doppler images. Flow is demonstrated with spectral doppler . Low amplitude venous waveforms are obtained. Proximal at portal, 36.8 cm per second. Mid, 30.6 cm per second Distal at hepatic vein, 57.2 cm per second. . IVC is patent. Hepatic veins: Right, 20.17 cm per second, hepatofugal flow. Mid, 45.6 cm per second, hepatofugal flow. Left, unable to visualize due to technical factors. Splenic vein, 34.3 cm per second. Superior mesenteric vein, unable to visualize due to technical factors. Hepatic artery, patent with normal arterial waveform. Aorta: unable to visualize due to bowel gas. OTHER: Moderate ascites is present. Small right pleural effusion. LIMITATIONS/ARTIFACTS: Examination is technically limited due to patient unable to hold breath and heavy breathing during examination resulting in motion  artifact. Overlying bowel gas obscures some areas. IMPRESSION: 1. Moderate ascites. 2. Changes of hepatic cirrhosis with diffuse hepatic parenchymal atrophy and nodular contour, without focal hepatic lesion. 3. TIPS stent is patent, with low flow venous waveform obtained. Doppler evaluation limited by technical factors. 4. Small right pleural effusion, and no sonographic evidence of varices. Electronically signed by: Elsie Gravely MD 01/16/2024 05:31 PM EST RP Workstation: [id number]   US  ASCITES (ABDOMEN LIMITED) Result Date: 01/16/2024 EXAM: LIMITED ABDOMINAL ULTRASOUND FOR ASCITES EVALUATION TECHNIQUE: Limited real-time sonography of all 4 quadrants of the abdomen was performed for evaluation of ascites. COMPARISON: US  Abdomen 06/14/2023; 01/18/2023. CLINICAL HISTORY: Ascites. FINDINGS: RIGHT UPPER QUADRANT: Large volume ascites seen. LEFT UPPER QUADRANT: Large volume ascites seen. RIGHT LOWER QUADRANT: Large volume ascites seen. LEFT LOWER QUADRANT: Large volume ascites seen. OTHER: Limited visualization of the rest of the abdomen demonstrates no acute abnormality. IMPRESSION: 1. Large volume ascites Electronically signed by: Rogelia Myers MD 01/16/2024 04:54 PM EST RP Workstation: [id number]   CT HEAD WO CONTRAST Result Date: 01/16/2024 EXAM: CT HEAD WITHOUT 01/16/2024 11:25:51 AM TECHNIQUE: CT of the head  was performed without the administration of intravenous contrast. Automated exposure control, iterative reconstruction, and/or weight based adjustment of the mA/kV was utilized to reduce the radiation dose to as low as reasonably achievable. COMPARISON: None available. CLINICAL HISTORY: 74 year old male with cirrhosis, altered mental status, and confusion for 2 days. FINDINGS: BRAIN AND VENTRICLES: No acute intracranial hemorrhage. No mass effect or midline shift. No extra-axial fluid collection. No evidence of acute infarct. No hydrocephalus. Brain volume within normal limits for age. Small but  circumscribed chronic appearing infarct in the central right cerebellum on series 2 image 9. Elsewhere gray white differentiation is within normal limits for age. No suspicious intracranial vascular hyperdensity. No acute traumatic injury identified. ORBITS: No acute abnormality. SINUSES AND MASTOIDS: Incidental right maxillary alveolar recess retention cyst. Paranasal sinuses, tympanic cavities and mastoids are well aerated. SOFT TISSUES AND SKULL: No acute skull fracture. No acute soft tissue abnormality. Calcified atherosclerosis at the skull base. IMPRESSION: 1. No acute intracranial abnormality. Chronic appearing small right cerebellar infarct. Electronically signed by: Helayne Hurst MD 01/16/2024 11:57 AM EST RP Workstation: [id number]   DG Chest Portable 1 View Result Date: 01/16/2024 CLINICAL DATA:  Shortness of breath.  Altered mental status. EXAM: PORTABLE CHEST 1 VIEW COMPARISON:  01/05/2024 FINDINGS: Lungs are hypoinflated demonstrate interval worsening hazy opacification over the mid to lower right lung suggesting moderate size effusion likely with associated compressive atelectasis in the right base. Patient is slightly rotated to the left. Left lung is otherwise clear. Cardiomediastinal silhouette and remainder of the exam is unchanged. IMPRESSION: Interval worsening hazy opacification over the mid to lower right lung suggesting moderate size effusion likely with associated compressive atelectasis in the right base. Electronically Signed   By: Toribio Agreste M.D.   On: 01/16/2024 10:54      Scheduled Meds:  lactulose   30 g Oral TID   rifaximin   550 mg Oral BID   Continuous Infusions:    LOS: 2 days    Time spent: 35 minutes     Orland Born, Student PA [facility]   To contact the attending provider between 7A-7P or the covering provider during after hours 7P-7A, please log into the web site [email or web address] and access using universal [place] password for that web site. If  you do not have the password, please call the hospital operator.  01/18/2024, 9:59 AM

## 2024-01-18 NOTE — Plan of Care (Signed)
°  Patient is progressing towards goals of care    Problem: Education: Goal: Knowledge of General Education information will improve Description: Including pain rating scale, medication(s)/side effects and non-pharmacologic comfort measures Outcome: Progressing   Problem: Health Behavior/Discharge Planning: Goal: Ability to manage health-related needs will improve Outcome: Progressing   Problem: Clinical Measurements: Goal: Ability to maintain clinical measurements within normal limits will improve Outcome: Progressing Goal: Will remain free from infection Outcome: Progressing Goal: Diagnostic test results will improve Outcome: Progressing Goal: Respiratory complications will improve Outcome: Progressing Goal: Cardiovascular complication will be avoided Outcome: Progressing   Problem: Activity: Goal: Risk for activity intolerance will decrease Outcome: Progressing   Problem: Nutrition: Goal: Adequate nutrition will be maintained Outcome: Progressing   Problem: Coping: Goal: Level of anxiety will decrease Outcome: Progressing   Problem: Elimination: Goal: Will not experience complications related to bowel motility Outcome: Progressing Goal: Will not experience complications related to urinary retention Outcome: Progressing   Problem: Pain Managment: Goal: General experience of comfort will improve and/or be controlled Outcome: Progressing   Problem: Safety: Goal: Ability to remain free from injury will improve Outcome: Progressing   Problem: Skin Integrity: Goal: Risk for impaired skin integrity will decrease Outcome: Progressing

## 2024-01-18 NOTE — Evaluation (Addendum)
 Occupational Therapy Evaluation Patient Details Name: David Esparza MRN: [id number] DOB: Feb 12, 1950 Today's Date: 01/18/2024   History of Present Illness   Pt is a 74 y/o M who presented to [facility] ED 01/16/24 for AMS x2-3 days. Wife notes pt has been confused recently and non-compliant with his medications. Had a TIPS procedure on 12/4 along with right thoracentesis and left gastric vein embolization.  Patient had a pulmonology appointment on 12/19 where patient was dealing with some fluid overload post-procedure. PMHx: DM, HLD, liver cirrhosis secondary to NASH.     Clinical Impressions Pt seen for OT evaluation this AM. No family at bedside; pt is questionable historian. AOX4; flat affect; delayed processing and comprehension. PTA, he reports being independent with ADLs and mobility; lives with his wife who assists with household chores. Today he was no more than CGA for all transfers/ambulation without AD (although rather unsteady). Mod A for LB self-care and CGA during standing UB ADLs. VSS. Limited by cognitive and balance deficits in addition to body habitus and poor activity tolerance.  Pt is currently functioning below baseline and would benefit from ongoing acute OT services to progress towards safe discharge and to facilitate return to prior level of function. Current recommendation is home with home health OT.     If plan is discharge home, recommend the following:   A little help with walking and/or transfers;A little help with bathing/dressing/bathroom;Assistance with cooking/housework;Direct supervision/assist for medications management;Direct supervision/assist for financial management;Assist for transportation;Help with stairs or ramp for entrance;Supervision due to cognitive status     Functional Status Assessment   Patient has had a recent decline in their functional status and demonstrates the ability to make significant improvements in function in a reasonable and predictable  amount of time.     Equipment Recommendations   BSC/3in1;Other (comment) (RW (if pt does not already have DME - no family present to confirm))     Recommendations for Other Services         Precautions/Restrictions   Precautions Precautions: Fall Recall of Precautions/Restrictions: Intact Restrictions Weight Bearing Restrictions Per Provider Order: No     Mobility Bed Mobility Overal bed mobility: Needs Assistance Bed Mobility: Supine to Sit, Sit to Supine     Supine to sit: Contact guard Sit to supine: Contact guard assist   General bed mobility comments: exited to the L side, HOB elevated, reliant on bed rails to assist    Transfers Overall transfer level: Needs assistance Equipment used: None Transfers: Sit to/from Stand Sit to Stand: Contact guard assist           General transfer comment: Stood from bed with incr effort/time      Balance Overall balance assessment: Needs assistance Sitting-balance support: No upper extremity supported, Feet supported Sitting balance-Leahy Scale: Fair Sitting balance - Comments: seated EOB   Standing balance support: Single extremity supported, No upper extremity supported, During functional activity Standing balance-Leahy Scale: Fair Standing balance comment: no overt LOB, but pt with truncal lateral sway and often reaching for environmental objects to steady self with (sink vanity, doorframe, walls)                           ADL either performed or assessed with clinical judgement   ADL Overall ADL's : Needs assistance/impaired Eating/Feeding: Independent   Grooming: Minimal assistance;Standing;Wash/dry hands Grooming Details (indicate cue type and reason): sequencing cues to retrieve/locate soap dispenser  Upper Body Dressing : Set up;Sitting   Lower Body Dressing: Moderate assistance;Sitting/lateral leans Lower Body Dressing Details (indicate cue type and reason): utilizes modified  figure four method for LB dressing EOB, assist with LLE sock 2/2 reduced flexibility on LLE Toilet Transfer: Contact guard assist;Ambulation;Regular Toilet;Grab bars Toilet Transfer Details (indicate cue type and reason): reliant on GBs to assist, cues to control descent onto lower surface toilet height Toileting- Clothing Manipulation and Hygiene: Contact guard assist;Sitting/lateral lean;Sit to/from stand Toileting - Clothing Manipulation Details (indicate cue type and reason): performed posterior peri care with wet washcloths, CGA for stability in stance     Functional mobility during ADLs: Contact guard assist       Vision Baseline Vision/History:  (no eye glasses present at time of OT eval)       Perception         Praxis         Pertinent Vitals/Pain Pain Assessment Pain Assessment: No/denies pain     Extremity/Trunk Assessment Upper Extremity Assessment Upper Extremity Assessment: Generalized weakness   Lower Extremity Assessment Lower Extremity Assessment: Defer to PT evaluation   Cervical / Trunk Assessment Cervical / Trunk Assessment: Normal   Communication Communication Communication: No apparent difficulties   Cognition Arousal: Alert Behavior During Therapy: WFL for tasks assessed/performed Cognition: Cognition impaired   Orientation impairments:  (AOX4 using external aides (whiteboard)) Awareness: Intellectual awareness impaired Memory impairment (select all impairments): Short-term memory, Declarative long-term memory Attention impairment (select first level of impairment): Sustained attention Executive functioning impairment (select all impairments): Sequencing, Reasoning, Problem solving, Initiation OT - Cognition Comments: pt reports he feels his cognition is improving from admission, but appears with delayed processing, limited comprehension, and reduced insight/reasoning                 Following commands: Impaired Following commands  impaired: Follows one step commands with increased time     Cueing  General Comments   Cueing Techniques: Verbal cues;Tactile cues;Visual cues  VSS throughout   Exercises     Shoulder Instructions      Home Living Family/patient expects to be discharged to:: Private residence Living Arrangements: Spouse/significant other Available Help at Discharge: Family;Available 24 hours/day Type of Home: House Home Access: Level entry     Home Layout: One level         Bathroom Toilet: Standard     Home Equipment: None   Additional Comments: pt questionable historian      Prior Functioning/Environment Prior Level of Function : Independent/Modified Independent             Mobility Comments: no AD PTA (per pt report) ADLs Comments: pt reports being indep, then asking for assistance with LB dressing from OT    OT Problem List: Decreased strength;Decreased activity tolerance;Impaired balance (sitting and/or standing);Decreased cognition;Decreased safety awareness;Obesity   OT Treatment/Interventions: Self-care/ADL training;Energy conservation;DME and/or AE instruction;Therapeutic activities;Cognitive remediation/compensation;Patient/family education;Balance training      OT Goals(Current goals can be found in the care plan section)   Acute Rehab OT Goals Patient Stated Goal: did not state OT Goal Formulation: Patient unable to participate in goal setting Time For Goal Achievement: 02/01/24 Potential to Achieve Goals: Fair ADL Goals Pt Will Perform Grooming: (P) with supervision;standing Pt Will Perform Lower Body Bathing: (P) with min assist;sitting/lateral leans Pt Will Perform Lower Body Dressing: (P) with min assist;sitting/lateral leans;sit to/from stand Pt Will Transfer to Toilet: (P) with supervision;ambulating;regular height toilet;grab bars   OT Frequency:  Min 2X/week  Co-evaluation PT/OT/SLP Co-Evaluation/Treatment: Yes Reason for Co-Treatment:  Complexity of the patient's impairments (multi-system involvement);To address functional/ADL transfers PT goals addressed during session: Mobility/safety with mobility;Balance OT goals addressed during session: ADL's and self-care;Other (comment) (functional cognition)      AM-PAC OT 6 Clicks Daily Activity     Outcome Measure Help from another person eating meals?: None Help from another person taking care of personal grooming?: A Little Help from another person toileting, which includes using toliet, bedpan, or urinal?: A Little Help from another person bathing (including washing, rinsing, drying)?: A Lot Help from another person to put on and taking off regular upper body clothing?: A Little Help from another person to put on and taking off regular lower body clothing?: A Lot 6 Click Score: 17   End of Session Nurse Communication: Mobility status  Activity Tolerance: Patient tolerated treatment well Patient left: in bed;with call bell/phone within reach;with bed alarm set  OT Visit Diagnosis: Unsteadiness on feet (R26.81);Repeated falls (R29.6);Other symptoms and signs involving cognitive function                Time: 9158-9097 OT Time Calculation (min): 21 min Charges:  OT General Charges $OT Visit: 1 Visit OT Evaluation $OT Eval Moderate Complexity: 1 Mod  Alianys Chacko M. [place], OTR/L [facility] Acute Rehabilitation Services [phone] Secure Chat Preferred  Mat Stuard 01/18/2024, 1:14 PM

## 2024-01-18 NOTE — Progress Notes (Addendum)
 "    [facility] Progress Note  CC:   Decompensated cirrhosis   Subjective: He is sitting up in the bed.  He denies having any nausea or vomiting.  No abdominal pain.  He passed 2 yellow loose stools thus far today, no bloody or black stools as confirmed by his RN.  His respirations remain slightly labored on oxygen 2 L nasal cannula.  No chest pain.  He is tolerating small quantities of solid food.   Objective:  Vital signs in last 24 hours: Temp:  [97.3 F (36.3 C)-98.7 F (37.1 C)] 98.7 F (37.1 C) (01/08 1200) Pulse Rate:  [73-90] 78 (01/08 0326) Resp:  [12-23] 13 (01/08 0326) BP: (101-124)/(61-93) 101/80 (01/08 0326) SpO2:  [93 %-97 %] 93 % (01/08 0326) Last BM Date : 01/18/24 General: Chronically ill-appearing 74 year old male awake and alert in no acute distress. Eyes: Mild scleral icterus.  Heart: Regular rate and rhythm, no murmurs. Pulm: Breath sounds clear, decreased in the right base. Mild paradoxical breathing. Abdomen: Abdomen is protuberant with gassy distention and ascites, abdomen is not tense.  Lower abdominal wall anasarca present.  Nontender.  Large protruding umbilical hernia present.  Positive bowel sounds to all 4 quadrants.  No bruit.  No palpable hepatosplenomegaly. Extremities: Bilateral lower extremity edema, RLE > LLE. Neurologic: Alert and oriented to name and place, stated the year was 1924.  Speech is clear.  Moves all extremities equally.  No asterixis. Psych:  Alert and cooperative. Normal mood and affect.  Intake/Output from previous day: 01/07 0701 - 01/08 0700 In: 82.9 [IV Piggyback:82.9] Out: 1450 [Urine:1450] Intake/Output this shift: Total I/O In: 200 [P.O.:200] Out: 400 [Urine:400]  Lab Results: Recent Labs    01/16/24 1108 01/17/24 0202 01/18/24 0358  WBC 11.9* 7.7 6.8  HGB 15.5 12.5* 11.7*  HCT 43.0 34.8* 32.8*  PLT 68* 44* 41*   BMET Recent Labs    01/17/24 0202 01/17/24 0940 01/18/24 0358  NA 137 136 139   K 4.0 4.0 4.0  CL 102 103 105  CO2 22 21* 23  GLUCOSE 128* 130* 123*  BUN 65* 62* 54*  CREATININE 2.35* 2.09* 1.93*  CALCIUM  8.5* 8.2* 8.3*   LFT Recent Labs    01/18/24 0358  PROT 5.8*  ALBUMIN  3.0*  AST 25  ALT 16  ALKPHOS 67  BILITOT 3.1*   PT/INR Recent Labs    01/16/24 1633 01/17/24 0202  LABPROT 19.1* 20.4*  INR 1.5* 1.7*   MELD 3.0: 24 at 01/18/2024  3:58 AM MELD-Na: 23 at 01/18/2024  3:58 AM Calculated from: Serum Creatinine: 1.93 mg/dL at 08/11/7971  6:41 AM Serum Sodium: 139 mmol/L (Using max of 137 mmol/L) at 01/18/2024  3:58 AM Total Bilirubin: 3.1 mg/dL at 08/11/7971  6:41 AM Serum Albumin : 3 g/dL at 08/11/7971  6:41 AM INR(ratio): 1.7 at 01/17/2024  2:02 AM Age at listing (hypothetical): 2 years Sex: Male at 01/18/2024  3:58 AM   Hepatitis Panel No results for input(s): HEPBSAG, HCVAB, HEPAIGM, HEPBIGM in the last 72 hours.  US  RENAL Result Date: 01/18/2024 CLINICAL DATA:  Acute kidney injury EXAM: RENAL / URINARY TRACT ULTRASOUND COMPLETE COMPARISON:  None Available. FINDINGS: Right Kidney: Renal measurements: 9.6 cm x 5.3 cm x 5.3 cm = volume: 139.9 mL. Echogenicity within normal limits. No mass or hydronephrosis visualized. Left Kidney: Renal measurements: 10.9 cm x 6.1 cm x 6.0 cm = volume: 210.9 mL. Echogenicity within normal limits. No mass or hydronephrosis visualized. Bladder: Appears normal for degree of  bladder distention. Other: Of incidental note is the presence of ascites throughout the abdomen. The study is technically limited secondary to the patient's body habitus and lack of patient cooperation, as per the ultrasound technologist. IMPRESSION: 1. Ascites. 2. Normal ultrasonographic appearance of the kidneys. Electronically Signed   By: Suzen Dials M.D.   On: 01/18/2024 14:53   IR ABDOMEN US  LIMITED Result Date: 01/17/2024 INDICATION: 74 year old male with history of MASH and cirrhosis with ascites. History of TIPS procedure performed on  12/14/2023. Received request for diagnostic and therapeutic paracentesis. EXAM: ULTRASOUND ABDOMEN LIMITED FINDINGS: All four abdominal quadrants were examined. There was no significant pocket of fluid to allow for safe approach for paracentesis. IMPRESSION: All four abdominal quadrants were examined. There was no significant pocket of fluid to allow for safe approach for paracentesis. No procedure performed. Read by: Rayfield Buff, NP under the supervision of Dr. Cordella Banner Electronically Signed   By: Cordella Banner   On: 01/17/2024 13:37   US  LIVER DOPPLER Result Date: 01/16/2024 EXAM: Ultrasound Hepatic Liver Doppler 01/16/2024 04:45:53 PM TECHNIQUE: Real-time ultrasonography of the right upper quadrant of the abdomen was performed. COMPARISON: US  Abdomen 01/16/2024 03:14 PM. CLINICAL HISTORY: Ascites. FINDINGS: LIVER: Changes of hepatic cirrhosis with diffuse hepatic parenchymal atrophy and nodular contour. No focal lesions are identified. No intrahepatic biliary ductal dilatation. Varices are absent. BILIARY SYSTEM: Gallbladder wall thickness measures 5.5 mm. No pericholecystic fluid. No cholelithiasis. Negative sonographic Murphy's sign. The common bile duct measures 2.7 mm. VASCULAR: Portal vein:   Unable to visualize due to technical factors. Tips stent: No flow is seen in the tip of the stent on color flow doppler images. Flow is demonstrated with spectral doppler . Low amplitude venous waveforms are obtained. Proximal at portal, 36.8 cm per second. Mid, 30.6 cm per second Distal at hepatic vein, 57.2 cm per second. . IVC is patent. Hepatic veins: Right, 20.17 cm per second, hepatofugal flow. Mid, 45.6 cm per second, hepatofugal flow. Left, unable to visualize due to technical factors. Splenic vein, 34.3 cm per second. Superior mesenteric vein, unable to visualize due to technical factors. Hepatic artery, patent with normal arterial waveform. Aorta: unable to visualize due to bowel gas. OTHER:  Moderate ascites is present. Small right pleural effusion. LIMITATIONS/ARTIFACTS: Examination is technically limited due to patient unable to hold breath and heavy breathing during examination resulting in motion artifact. Overlying bowel gas obscures some areas. IMPRESSION: 1. Moderate ascites. 2. Changes of hepatic cirrhosis with diffuse hepatic parenchymal atrophy and nodular contour, without focal hepatic lesion. 3. TIPS stent is patent, with low flow venous waveform obtained. Doppler evaluation limited by technical factors. 4. Small right pleural effusion, and no sonographic evidence of varices. Electronically signed by: Elsie Gravely MD 01/16/2024 05:31 PM EST RP Workstation: [id number]   US  ASCITES (ABDOMEN LIMITED) Result Date: 01/16/2024 EXAM: LIMITED ABDOMINAL ULTRASOUND FOR ASCITES EVALUATION TECHNIQUE: Limited real-time sonography of all 4 quadrants of the abdomen was performed for evaluation of ascites. COMPARISON: US  Abdomen 06/14/2023; 01/18/2023. CLINICAL HISTORY: Ascites. FINDINGS: RIGHT UPPER QUADRANT: Large volume ascites seen. LEFT UPPER QUADRANT: Large volume ascites seen. RIGHT LOWER QUADRANT: Large volume ascites seen. LEFT LOWER QUADRANT: Large volume ascites seen. OTHER: Limited visualization of the rest of the abdomen demonstrates no acute abnormality. IMPRESSION: 1. Large volume ascites Electronically signed by: Rogelia Myers MD 01/16/2024 04:54 PM EST RP Workstation: [id number]    Assessment / Plan:  74 year old male with decompensated MASH cirrhosis with esophageal varices, thrombocytopenia, splenomegaly, ascites and  recurrent hepatic hydrothorax s/p multiple thoracentesis. Subsequently underwent TIPS procedure per IR 12/14/2023 with right thoracocentesis and left gastric vein embolization which required hospitalization due to postprocedure hypotension, brief admission to the ICU on pressors then discharged home 12/16/2023 on Furosemide  40 mg daily, Aldactone  50 mg daily and  Lactulose  tid. S/P paracentesis per IR 12/12 or 12/29, peritoneal fluid labs were not collected therefore SBP was not ruled out. Admitted 01/16/2024 with generalized weakness, recurrent ascites and altered mental status after stopping all medications 2 weeks ago.  No prior history of hepatic encephalopathy (patient was started on Lactulose  status post TIPS). Ammonia 88 -> 64. Head CT without acute intracranial abnormality. RUQ sono 1/6 showed large volume ascites, however, paracentesis was attempted per IR 1/7 and there was no significant pocket of fluid to allow for safe approach for paracentesis. Liver doppler 1/6 showed a patent TIPS. MELD 3.0:24.  - Continue 2gm low sodium diet, protein 1.2 - 1.5 gm/kg/day - Recommend nutritional consult - Continue Xifaxan  550 mg 1 capsule p.o. twice daily - Continue Lactulose  30gm 3 times daily p.o. for now, titrate to no more than 2-3 loose bowel movements daily - Consider repeat limited abdominal sonogram 1/9 to reassess if there is enough ascites for paracentesis - Chest xray to reassess for recurrent hepatic hydrothorax - Await further recommendations per Dr. Stacia   AKI, at risk for HRS. On Albumin  IV Q 6 hrs. Diuretics on hold. Renal status improving. Cr 2.61 -> 2.35 -> 2.09 -> 1.93. Urine sodium < 30.  Renal sonogram showed normal appearance of the kidneys.  Coagulopathy secondary to cirrhosis. INR 1.5 -> 1.7.  - PT/INR in am  Mild macrocytic anemia. Hg 15.5 -> 12.5 -> 11.7. MCV 101.2.   Thrombocytopenia secondary to cirrhosis with splenomegaly. PLT 44 -> 41.            Principal Problem:   Hepatic encephalopathy (HCC) Active Problems:   Liver cirrhosis secondary to NASH (HCC)   S/P TIPS (transjugular intrahepatic portosystemic shunt)   AKI (acute kidney injury)     LOS: 2 days   Elida CHRISTELLA Shawl  01/18/2024, 3:46 PM    "

## 2024-01-19 DIAGNOSIS — K7682 Hepatic encephalopathy: Secondary | ICD-10-CM | POA: Diagnosis not present

## 2024-01-19 DIAGNOSIS — K7581 Nonalcoholic steatohepatitis (NASH): Secondary | ICD-10-CM | POA: Diagnosis not present

## 2024-01-19 DIAGNOSIS — D539 Nutritional anemia, unspecified: Secondary | ICD-10-CM | POA: Diagnosis not present

## 2024-01-19 DIAGNOSIS — J918 Pleural effusion in other conditions classified elsewhere: Secondary | ICD-10-CM

## 2024-01-19 DIAGNOSIS — D689 Coagulation defect, unspecified: Secondary | ICD-10-CM | POA: Diagnosis not present

## 2024-01-19 DIAGNOSIS — K769 Liver disease, unspecified: Secondary | ICD-10-CM

## 2024-01-19 DIAGNOSIS — K7469 Other cirrhosis of liver: Secondary | ICD-10-CM | POA: Diagnosis not present

## 2024-01-19 DIAGNOSIS — N179 Acute kidney failure, unspecified: Secondary | ICD-10-CM | POA: Diagnosis not present

## 2024-01-19 DIAGNOSIS — D696 Thrombocytopenia, unspecified: Secondary | ICD-10-CM

## 2024-01-19 DIAGNOSIS — Z95828 Presence of other vascular implants and grafts: Secondary | ICD-10-CM | POA: Diagnosis not present

## 2024-01-19 LAB — BASIC METABOLIC PANEL WITH GFR
Anion gap: 9 (ref 5–15)
BUN: 45 mg/dL — ABNORMAL HIGH (ref 8–23)
CO2: 24 mmol/L (ref 22–32)
Calcium: 8.3 mg/dL — ABNORMAL LOW (ref 8.9–10.3)
Chloride: 106 mmol/L (ref 98–111)
Creatinine, Ser: 1.66 mg/dL — ABNORMAL HIGH (ref 0.61–1.24)
GFR, Estimated: 43 mL/min — ABNORMAL LOW
Glucose, Bld: 132 mg/dL — ABNORMAL HIGH (ref 70–99)
Potassium: 4.1 mmol/L (ref 3.5–5.1)
Sodium: 140 mmol/L (ref 135–145)

## 2024-01-19 LAB — CBC
HCT: 33.1 % — ABNORMAL LOW (ref 39.0–52.0)
Hemoglobin: 11.7 g/dL — ABNORMAL LOW (ref 13.0–17.0)
MCH: 35.8 pg — ABNORMAL HIGH (ref 26.0–34.0)
MCHC: 35.3 g/dL (ref 30.0–36.0)
MCV: 101.2 fL — ABNORMAL HIGH (ref 80.0–100.0)
Platelets: 42 K/uL — ABNORMAL LOW (ref 150–400)
RBC: 3.27 MIL/uL — ABNORMAL LOW (ref 4.22–5.81)
RDW: 15.4 % (ref 11.5–15.5)
WBC: 7.5 K/uL (ref 4.0–10.5)
nRBC: 0 % (ref 0.0–0.2)

## 2024-01-19 MED ORDER — FUROSEMIDE 10 MG/ML IJ SOLN
20.0000 mg | Freq: Once | INTRAMUSCULAR | Status: AC
  Administered 2024-01-19: 20 mg via INTRAVENOUS
  Filled 2024-01-19: qty 2

## 2024-01-19 MED ORDER — MIDODRINE HCL 5 MG PO TABS
2.5000 mg | ORAL_TABLET | Freq: Two times a day (BID) | ORAL | Status: DC
  Administered 2024-01-20 (×2): 2.5 mg via ORAL
  Filled 2024-01-19 (×3): qty 1

## 2024-01-19 MED ORDER — ALBUMIN HUMAN 25 % IV SOLN
25.0000 g | Freq: Four times a day (QID) | INTRAVENOUS | Status: AC
  Administered 2024-01-19 – 2024-01-20 (×4): 25 g via INTRAVENOUS
  Filled 2024-01-19 (×4): qty 100

## 2024-01-19 MED ORDER — FUROSEMIDE 10 MG/ML IJ SOLN
20.0000 mg | Freq: Every day | INTRAMUSCULAR | Status: DC
  Administered 2024-01-19: 20 mg via INTRAVENOUS
  Filled 2024-01-19: qty 2

## 2024-01-19 NOTE — Progress Notes (Signed)
 Pt pvr (ongoing) scan <~140cc

## 2024-01-19 NOTE — TOC Progression Note (Addendum)
 Transition of Care [facility]) - Progression Note    Patient Details  Name: David Esparza MRN: [id number] Date of Birth: November 15, 1950  [facility] CM/SW Contact  Landry DELENA Senters, RN Phone Number: 01/19/2024, 9:54 AM  Clinical Narrative:        CM spoke with [facility] this morning, they report they were not able to connect with wife yesterday, and plan to try again today.   Patient condition has improved since admission. He did originally qualify for hospice, but may need a reassessment.   Therapy rec for HHPT/OT. [facility] plans to speak with wife and patient today, and will update.  CM will continue to follow.    16:00 - Patient doing better with physical mobility and cognition but will not eat and has not had BM.  Wife spoke with [facility] this morning. She wants to have [facility] hospice or go home with [facility] and OP palliative f/u, whichever patient is able to qualify for. Will need DME reviewed for home needs too. CM did not order rec. DME due to being unsure of dispo plan at this time.              Expected Discharge Plan and Services                                               Social Drivers of Health (SDOH) Interventions SDOH Screenings   Food Insecurity: No Food Insecurity (01/17/2024)  Housing: Low Risk (01/17/2024)  Transportation Needs: No Transportation Needs (01/17/2024)  Utilities: Not At Risk (01/17/2024)  Alcohol Screen: Low Risk (11/13/2023)  Depression (PHQ2-9): Low Risk (02/28/2023)  Financial Resource Strain: Low Risk (11/13/2023)  Physical Activity: Inactive (11/13/2023)  Social Connections: Socially Integrated (01/17/2024)  Stress: No Stress Concern Present (11/13/2023)  Tobacco Use: Medium Risk (01/16/2024)  Health Literacy: Adequate Health Literacy (02/28/2023)    Readmission Risk Interventions    12/16/2023   11:52 AM  Readmission Risk Prevention Plan  Post Dischage Appt Complete  Medication Screening Complete  Transportation  Screening Complete

## 2024-01-19 NOTE — Progress Notes (Signed)
 Physical Therapy Treatment Patient Details Name: David Esparza MRN: [id number] DOB: 1950/10/10 Today's Date: 01/19/2024   History of Present Illness Pt is a 74 y/o M who presented to [facility] ED 01/16/24 for AMS x2-3 days. Wife notes pt has been confused recently and non-compliant with his medications. Had a TIPS procedure on 12/4 along with right thoracentesis and left gastric vein embolization.  Patient had a pulmonology appointment on 12/19 where patient was dealing with some fluid overload post-procedure. PMHx: DM, HLD, liver cirrhosis secondary to NASH.    PT Comments  Pt received in supine and agreeable to session. Pt demonstrates improved activity tolerance with increased gait distance this session. Utilized RW this session for improved stability and energy conservation. Pt reports increased fatigue at end of session and declines further mobility. Pt continues to benefit from PT services to progress toward functional mobility goals.     If plan is discharge home, recommend the following: A little help with walking and/or transfers;A little help with bathing/dressing/bathroom;Assistance with cooking/housework;Assist for transportation;Help with stairs or ramp for entrance;Direct supervision/assist for medications management;Direct supervision/assist for financial management;Supervision due to cognitive status   Can travel by private vehicle        Equipment Recommendations  Cane;Rolling walker (2 wheels);BSC/3in1    Recommendations for Other Services       Precautions / Restrictions Precautions Precautions: Fall Recall of Precautions/Restrictions: Intact Restrictions Weight Bearing Restrictions Per Provider Order: No     Mobility  Bed Mobility Overal bed mobility: Needs Assistance Bed Mobility: Supine to Sit     Supine to sit: Contact guard     General bed mobility comments: increased time and use of bed features    Transfers Overall transfer level: Needs  assistance Equipment used: Rolling walker (2 wheels) Transfers: Sit to/from Stand Sit to Stand: Contact guard assist           General transfer comment: from low bed height with increased time for rise, but no assist needed    Ambulation/Gait Ambulation/Gait assistance: Contact guard assist Gait Distance (Feet): 120 Feet Assistive device: Rolling walker (2 wheels) Gait Pattern/deviations: Step-through pattern, Decreased stride length, Trunk flexed       General Gait Details: Pt demonstrates slowed gait with flexed posture despite cues. Cues for RW proximity   Stairs             Wheelchair Mobility     Tilt Bed    Modified Rankin (Stroke Patients Only)       Balance Overall balance assessment: Needs assistance Sitting-balance support: No upper extremity supported, Feet supported Sitting balance-Leahy Scale: Good Sitting balance - Comments: EOB   Standing balance support: During functional activity, Bilateral upper extremity supported Standing balance-Leahy Scale: Fair Standing balance comment: Pt benefits from RW support for improved stability                            Communication Communication Communication: No apparent difficulties  Cognition Arousal: Alert Behavior During Therapy: WFL for tasks assessed/performed   PT - Cognitive impairments: Awareness, Problem solving, Safety/Judgement                         Following commands: Intact Following commands impaired: Follows one step commands with increased time    Cueing Cueing Techniques: Verbal cues, Tactile cues, Visual cues  Exercises      General Comments        Pertinent Vitals/Pain  Pain Assessment Pain Assessment: No/denies pain     PT Goals (current goals can now be found in the care plan section) Acute Rehab PT Goals Patient Stated Goal: None stated Progress towards PT goals: Progressing toward goals    Frequency    Min 1X/week       AM-PAC  PT 6 Clicks Mobility   Outcome Measure  Help needed turning from your back to your side while in a flat bed without using bedrails?: None Help needed moving from lying on your back to sitting on the side of a flat bed without using bedrails?: None Help needed moving to and from a bed to a chair (including a wheelchair)?: A Little Help needed standing up from a chair using your arms (e.g., wheelchair or bedside chair)?: A Little Help needed to walk in hospital room?: A Little Help needed climbing 3-5 steps with a railing? : A Lot 6 Click Score: 19    End of Session Equipment Utilized During Treatment: Gait belt Activity Tolerance: Patient tolerated treatment well Patient left: with call bell/phone within reach;in chair;with chair alarm set Nurse Communication: Mobility status PT Visit Diagnosis: Unsteadiness on feet (R26.81);Muscle weakness (generalized) (M62.81)     Time: 8481-8455 PT Time Calculation (min) (ACUTE ONLY): 26 min  Charges:    $Gait Training: 8-22 mins $Therapeutic Activity: 8-22 mins PT General Charges $$ ACUTE PT VISIT: 1 Visit                    Darryle George, PTA Acute Rehabilitation Services Secure Chat Preferred  [phone]    Darryle George 01/19/2024, 4:09 PM

## 2024-01-19 NOTE — Plan of Care (Signed)
   Problem: Clinical Measurements: Goal: Ability to maintain clinical measurements within normal limits will improve Outcome: Progressing Goal: Diagnostic test results will improve Outcome: Progressing   Problem: Activity: Goal: Risk for activity intolerance will decrease Outcome: Progressing   Problem: Nutrition: Goal: Adequate nutrition will be maintained Outcome: Progressing

## 2024-01-19 NOTE — Progress Notes (Addendum)
 "    [facility] Progress Note  CC:  Decompensated cirrhosis     Subjective: He endorses having a decreased appetite.  He did not eat breakfast and has not yet ordered his lunch tray.  He is drinking Glucerna.  No nausea or vomiting.  He feels his abdomen is more distended today.  No BM thus far today as confirmed by his RN.  He has remittent mild shortness of breath.  No chest pain.  Friends at the bedside.   Objective:  Vital signs in last 24 hours: Temp:  [97.4 F (36.3 C)-98.7 F (37.1 C)] 97.6 F (36.4 C) (01/09 0800) Pulse Rate:  [79-87] 81 (01/09 0800) Resp:  [14-23] 18 (01/09 0800) BP: (110-134)/(75-93) 134/93 (01/09 0800) SpO2:  [92 %-96 %] 94 % (01/09 0800) Last BM Date : 01/18/24 General: Chronically ill appearing 74 year old male awake and alert in no acute distress. Eyes: Mild scleral icterus present. Heart: Regular rate and rhythm, no murmurs. Pulm: Breath sounds with few scattered tight wheezes, decreased breath sounds to the right lower base.  On room air.  Respirations mildly labored, in no acute distress. Abdomen: Abdomen is protuberant with gaseous distention and ascites, abdomen is not tense. Lower abdominal wall anasarca present. Nontender. Large protruding umbilical hernia present. Positive bowel sounds to all 4 quadrants. No bruit. No palpable hepatosplenomegaly.  Extremities: No lower extremity edema. Neurologic:  Alert and oriented to name and place, patient stated the year was in the 1924.  Speech is clear.  Moves all extremities equally.  No asterixis. Psych: Nonagitated.  Intake/Output from previous day: 01/08 0701 - 01/09 0700 In: 200 [P.O.:200] Out: 1050 [Urine:1050] Intake/Output this shift: No intake/output data recorded.  Lab Results: Recent Labs    01/17/24 0202 01/18/24 0358 01/19/24 0309  WBC 7.7 6.8 7.5  HGB 12.5* 11.7* 11.7*  HCT 34.8* 32.8* 33.1*  PLT 44* 41* 42*   BMET Recent Labs    01/17/24 0940 01/18/24 0358  01/19/24 0309  NA 136 139 140  K 4.0 4.0 4.1  CL 103 105 106  CO2 21* 23 24  GLUCOSE 130* 123* 132*  BUN 62* 54* 45*  CREATININE 2.09* 1.93* 1.66*  CALCIUM  8.2* 8.3* 8.3*   LFT Recent Labs    01/18/24 0358  PROT 5.8*  ALBUMIN  3.0*  AST 25  ALT 16  ALKPHOS 67  BILITOT 3.1*   PT/INR Recent Labs    01/16/24 1633 01/17/24 0202  LABPROT 19.1* 20.4*  INR 1.5* 1.7*   Hepatitis Panel No results for input(s): HEPBSAG, HCVAB, HEPAIGM, HEPBIGM in the last 72 hours.  DG CHEST PORT 1 VIEW Result Date: 01/18/2024 EXAM: 1 VIEW(S) XRAY OF THE CHEST 01/18/2024 04:14:00 PM COMPARISON: 01/16/2024 CLINICAL HISTORY: Labored breathing FINDINGS: LUNGS AND PLEURA: Persistent large right pleural effusion with associated hazy opacity throughout the right lung, likely reflecting compressive atelectasis. No pneumothorax. HEART AND MEDIASTINUM: Aortic atherosclerosis. No acute abnormality of the cardiac and mediastinal silhouettes. BONES AND SOFT TISSUES: No acute osseous abnormality. IMPRESSION: 1. Persistent large right pleural effusion with associated hazy opacity throughout the right lung, likely reflecting compressive atelectasis. Electronically signed by: Greig Pique MD MD 01/18/2024 07:54 PM EST RP Workstation: [id number]   US  RENAL Result Date: 01/18/2024 CLINICAL DATA:  Acute kidney injury EXAM: RENAL / URINARY TRACT ULTRASOUND COMPLETE COMPARISON:  None Available. FINDINGS: Right Kidney: Renal measurements: 9.6 cm x 5.3 cm x 5.3 cm = volume: 139.9 mL. Echogenicity within normal limits. No mass or hydronephrosis visualized. Left Kidney:  Renal measurements: 10.9 cm x 6.1 cm x 6.0 cm = volume: 210.9 mL. Echogenicity within normal limits. No mass or hydronephrosis visualized. Bladder: Appears normal for degree of bladder distention. Other: Of incidental note is the presence of ascites throughout the abdomen. The study is technically limited secondary to the patient's body habitus and lack of  patient cooperation, as per the ultrasound technologist. IMPRESSION: 1. Ascites. 2. Normal ultrasonographic appearance of the kidneys. Electronically Signed   By: Suzen Dials M.D.   On: 01/18/2024 14:53   IR ABDOMEN US  LIMITED Result Date: 01/17/2024 INDICATION: 74 year old male with history of MASH and cirrhosis with ascites. History of TIPS procedure performed on 12/14/2023. Received request for diagnostic and therapeutic paracentesis. EXAM: ULTRASOUND ABDOMEN LIMITED FINDINGS: All four abdominal quadrants were examined. There was no significant pocket of fluid to allow for safe approach for paracentesis. IMPRESSION: All four abdominal quadrants were examined. There was no significant pocket of fluid to allow for safe approach for paracentesis. No procedure performed. Read by: Rayfield Buff, NP under the supervision of Dr. Cordella Banner Electronically Signed   By: Cordella Banner   On: 01/17/2024 13:37    Assessment / Plan:  74 year old male with decompensated MASH cirrhosis with esophageal varices, thrombocytopenia, splenomegaly, ascites and recurrent hepatic hydrothorax s/p multiple thoracentesis. Subsequently underwent TIPS procedure per IR 12/14/2023 with right thoracocentesis and left gastric vein embolization which required hospitalization due to postprocedure hypotension, brief admission to the ICU on pressors then discharged home 12/16/2023 on Furosemide  40 mg daily, Aldactone  50 mg daily and Lactulose  tid. S/P paracentesis per IR 12/12 or 12/29, peritoneal fluid labs were not collected therefore SBP was not ruled out. Admitted 01/16/2024 with generalized weakness, recurrent ascites and altered mental status after stopping all medications 2 weeks ago.  No prior history of hepatic encephalopathy (patient was started on Lactulose  status post TIPS). Ammonia 88 -> 64. Head CT without acute intracranial abnormality. RUQ sono 1/6 showed large volume ascites, however, paracentesis was attempted per  IR 1/7 and there was no significant pocket of fluid to allow for safe approach for paracentesis. Liver doppler 1/6 showed a patent TIPS. Chest xray 1/8 showed a persistent large right pleural effusion. Started on Albumin  IV Q 6hrs x 4 doses on 1/8. On midodrine  for BP support. MELD 3.0:24. - Continue 2gm low sodium diet, protein 1.2 - 1.5 gm/kg/day - Recommend nutritional consult - Continue Xifaxan  550 mg 1 capsule p.o. twice daily - Continue Lactulose  30gm 3 times daily p.o. for now, titrate to no more than 2-3 loose bowel movements daily - Diuretics on hold since admission secondary to AKI, eventually slowly restart diuretics when renal status stable. Addendum: Started Lasix  20mg  IV once daily  - ? repeat thoracentesis as chest x-ray 1/8 showed a persistent large right pleural effusion, await further recommendations per Dr. Stacia - CMP, CBC and PT/INR in am   AKI, at risk for HRS. On Albumin  IV Q 6 hrs. Diuretics on hold. Renal status improving. Cr 2.61 -> 2.35 -> 2.09 -> 1.93 -> 1.66. Urine sodium < 30.  Renal sonogram showed normal appearance of the kidneys.   Coagulopathy secondary to cirrhosis. INR 1.5 -> 1.7.  - PT/INR in am   Mild macrocytic anemia. Hg 15.5 -> 12.5 -> 11.7. MCV 101.2.  - CBC in am   Thrombocytopenia secondary to cirrhosis with splenomegaly. PLT 42.   Principal Problem:   Hepatic encephalopathy (HCC) Active Problems:   Liver cirrhosis secondary to NASH (HCC)  S/P TIPS (transjugular intrahepatic portosystemic shunt)   AKI (acute kidney injury)     LOS: 3 days   David Esparza  01/19/2024, 12:44 PM  --------------------------------------------------------------------  I have taken a history, reviewed the chart and examined the patient. I performed a substantive portion of this encounter, including complete performance of at least one of the key components, in conjunction with the APP. I agree with the APP's note, impression and  recommendations  Patient encephalopathy is improving slowly.  He reports not having any bowel movements today.  Continues to show some increased work of breathing, but satting well on room air.  Repeat chest x-ray shows large right-sided pleural effusion.  Renal function has improved significantly.  Given improvement in renal function and significant pleural effusion, will give 1 dose of IV Lasix  today.  Will reassess creatinine tomorrow.  If tolerating, will continue giving Lasix  to improve hepatic hydrothorax and fluid overload.  May need to increase lactulose  dose tomorrow, if patient's reported no bowel movements today is accurate.  Chenay Nesmith E. Stacia, MD [facility]  Moderate complex medical decision making (this includes chart review, review of results, face-to-face time used for counseling as well as treatment plan and follow-up. The patient was provided an opportunity to ask questions and all were answered. The patient agreed with the plan and demonstrated an understanding of the instructions    "

## 2024-01-19 NOTE — Progress Notes (Addendum)
 "  PROGRESS NOTE    David Esparza  FMW:[id number] DOB: [birth year] DOA: 01/16/2024 PCP: Domenica Harlene LABOR, MD (Confirm with patient/family/NH records and if not entered, this HAS to be entered at [facility] point of entry. No PCP if truly none.)   Chief Complaint  Patient presents with   AMS    Brief Narrative:   David Esparza is a 74 year old male with past medical history of hypertension, cirrhosis secondary to NASH, and hyperlipidemia. He presented to the ED with altered mental status x 2 days and abdominal distention x 5 days. He was found to have hepatic encephalopathy.    Assessment & Plan: Hepatic Encephalopathy  Recent TIPS procedure + lactulose  noncompliance.  Improved from IV albumin , rifaximin , and lactulose .  Continue rifaximin  and lactulose .    Liver Cirrhosis Secondary to NASH TIPS procedure 12/14/23  Monitor  Acute Kidney Injury  Likely prerenal (bladder scan and renal US  normal)  GFR, creatinine, and albumin  improving.  Repeat albumin  infusion.     DVT prophylaxis: SCD Code Status: Full  Family Communication: none at bedside Disposition:   Status is: Inpatient Remains inpatient appropriate because: severity of illness    Consultants:   Procedures:   Antimicrobials: rifaximin  tablet 550 mg    Subjective: Patient is lying comfortably in bed. No overnight complaints.   Objective: Vitals:   01/19/24 0015 01/19/24 0200 01/19/24 0400 01/19/24 0525  BP: 112/79  130/75   Pulse: 82 80 80 83  Resp: 17 14 17 19   Temp: 97.7 F (36.5 C)  (!) 97.4 F (36.3 C)   TempSrc: Axillary  Axillary   SpO2: 93% 94% 94%   Weight:      Height:        Intake/Output Summary (Last 24 hours) at 01/19/2024 0759 Last data filed at 01/19/2024 0017 Gross per 24 hour  Intake 200 ml  Output 1050 ml  Net -850 ml   Filed Weights   01/16/24 1031  Weight: 105 kg    Examination:  General exam: Appears calm and comfortable.  Respiratory system: Crackles on auscultation.  Respiratory effort slightly labored. Cardiovascular system: S1 & S2 heard, RRR. No JVD, murmurs, rubs, gallops or clicks. No pedal edema. Gastrointestinal system: Abdomen is nondistended, soft and nontender. No organomegaly or masses felt. Normal bowel sounds heard. Trace ascites. Central nervous system: Alert and oriented. No focal neurological deficits. Extremities: Symmetric 5 x 5 power. Skin: No rashes, lesions or ulcers Psychiatry: Judgement and insight appear normal. Mood & affect appropriate.     Data Reviewed: I have personally reviewed following labs and imaging studies.  CBC: Recent Labs  Lab 01/16/24 1108 01/17/24 0202 01/18/24 0358 01/19/24 0309  WBC 11.9* 7.7 6.8 7.5  HGB 15.5 12.5* 11.7* 11.7*  HCT 43.0 34.8* 32.8* 33.1*  MCV 101.4* 101.5* 101.2* 101.2*  PLT 68* 44* 41* 42*    Basic Metabolic Panel: Recent Labs  Lab 01/16/24 1108 01/17/24 0202 01/17/24 0940 01/18/24 0358 01/19/24 0309  NA 133* 137 136 139 140  K 4.8 4.0 4.0 4.0 4.1  CL 98 102 103 105 106  CO2 21* 22 21* 23 24  GLUCOSE 110* 128* 130* 123* 132*  BUN 63* 65* 62* 54* 45*  CREATININE 2.61* 2.35* 2.09* 1.93* 1.66*  CALCIUM  8.6* 8.5* 8.2* 8.3* 8.3*    GFR: Estimated Creatinine Clearance: 45.1 mL/min (A) (by C-G formula based on SCr of 1.66 mg/dL (H)).  Liver Function Tests: Recent Labs  Lab 01/16/24 1108 01/17/24 0202 01/17/24 0940  01/18/24 0358  AST 43* 29 28 25   ALT 25 18 18 16   ALKPHOS 99 72 83 67  BILITOT 3.8* 3.7* 3.1* 3.1*  PROT 7.1 6.0* 6.1* 5.8*  ALBUMIN  2.8* 2.9* 2.8* 3.0*    CBG: Recent Labs  Lab 01/16/24 1056  GLUCAP 109*     No results found for this or any previous visit (from the past 240 hours).     Radiology Studies: DG CHEST PORT 1 VIEW Result Date: 01/18/2024 EXAM: 1 VIEW(S) XRAY OF THE CHEST 01/18/2024 04:14:00 PM COMPARISON: 01/16/2024 CLINICAL HISTORY: Labored breathing FINDINGS: LUNGS AND PLEURA: Persistent large right pleural effusion with  associated hazy opacity throughout the right lung, likely reflecting compressive atelectasis. No pneumothorax. HEART AND MEDIASTINUM: Aortic atherosclerosis. No acute abnormality of the cardiac and mediastinal silhouettes. BONES AND SOFT TISSUES: No acute osseous abnormality. IMPRESSION: 1. Persistent large right pleural effusion with associated hazy opacity throughout the right lung, likely reflecting compressive atelectasis. Electronically signed by: Greig Pique MD MD 01/18/2024 07:54 PM EST RP Workstation: [id number]   US  RENAL Result Date: 01/18/2024 CLINICAL DATA:  Acute kidney injury EXAM: RENAL / URINARY TRACT ULTRASOUND COMPLETE COMPARISON:  None Available. FINDINGS: Right Kidney: Renal measurements: 9.6 cm x 5.3 cm x 5.3 cm = volume: 139.9 mL. Echogenicity within normal limits. No mass or hydronephrosis visualized. Left Kidney: Renal measurements: 10.9 cm x 6.1 cm x 6.0 cm = volume: 210.9 mL. Echogenicity within normal limits. No mass or hydronephrosis visualized. Bladder: Appears normal for degree of bladder distention. Other: Of incidental note is the presence of ascites throughout the abdomen. The study is technically limited secondary to the patient's body habitus and lack of patient cooperation, as per the ultrasound technologist. IMPRESSION: 1. Ascites. 2. Normal ultrasonographic appearance of the kidneys. Electronically Signed   By: Suzen Dials M.D.   On: 01/18/2024 14:53   IR ABDOMEN US  LIMITED Result Date: 01/17/2024 INDICATION: 74 year old male with history of MASH and cirrhosis with ascites. History of TIPS procedure performed on 12/14/2023. Received request for diagnostic and therapeutic paracentesis. EXAM: ULTRASOUND ABDOMEN LIMITED FINDINGS: All four abdominal quadrants were examined. There was no significant pocket of fluid to allow for safe approach for paracentesis. IMPRESSION: All four abdominal quadrants were examined. There was no significant pocket of fluid to allow for safe  approach for paracentesis. No procedure performed. Read by: Kristi Davenport, NP under the supervision of Dr. Cordella Banner Electronically Signed   By: Cordella Banner   On: 01/17/2024 13:37      Scheduled Meds:  lactulose   30 g Oral TID   midodrine   5 mg Oral TID WC   rifaximin   550 mg Oral BID   Continuous Infusions:   albumin  human 25 g (01/19/24 0449)     LOS: 3 days    Time spent: 35 minutes     Orland Born, Student PA [facility]   To contact the attending provider between 7A-7P or the covering provider during after hours 7P-7A, please log into the web site [email or web address] and access using universal [place] password for that web site. If you do not have the password, please call the hospital operator.  01/19/2024, 7:59 AM   "

## 2024-01-19 NOTE — Plan of Care (Signed)
  Problem: Health Behavior/Discharge Planning: Goal: Ability to manage health-related needs will improve Outcome: Progressing   Problem: Activity: Goal: Risk for activity intolerance will decrease Outcome: Progressing   Problem: Nutrition: Goal: Adequate nutrition will be maintained Outcome: Progressing   

## 2024-01-20 ENCOUNTER — Inpatient Hospital Stay (HOSPITAL_COMMUNITY)

## 2024-01-20 DIAGNOSIS — K7469 Other cirrhosis of liver: Secondary | ICD-10-CM | POA: Diagnosis not present

## 2024-01-20 DIAGNOSIS — K7681 Hepatopulmonary syndrome: Secondary | ICD-10-CM | POA: Diagnosis not present

## 2024-01-20 DIAGNOSIS — Z95828 Presence of other vascular implants and grafts: Secondary | ICD-10-CM | POA: Diagnosis not present

## 2024-01-20 DIAGNOSIS — K652 Spontaneous bacterial peritonitis: Secondary | ICD-10-CM | POA: Diagnosis not present

## 2024-01-20 DIAGNOSIS — N179 Acute kidney failure, unspecified: Secondary | ICD-10-CM | POA: Diagnosis not present

## 2024-01-20 DIAGNOSIS — K7581 Nonalcoholic steatohepatitis (NASH): Secondary | ICD-10-CM | POA: Diagnosis not present

## 2024-01-20 DIAGNOSIS — K7682 Hepatic encephalopathy: Secondary | ICD-10-CM | POA: Diagnosis not present

## 2024-01-20 LAB — CBC
HCT: 34.5 % — ABNORMAL LOW (ref 39.0–52.0)
Hemoglobin: 12.2 g/dL — ABNORMAL LOW (ref 13.0–17.0)
MCH: 36.2 pg — ABNORMAL HIGH (ref 26.0–34.0)
MCHC: 35.4 g/dL (ref 30.0–36.0)
MCV: 102.4 fL — ABNORMAL HIGH (ref 80.0–100.0)
Platelets: 58 K/uL — ABNORMAL LOW (ref 150–400)
RBC: 3.37 MIL/uL — ABNORMAL LOW (ref 4.22–5.81)
RDW: 15.7 % — ABNORMAL HIGH (ref 11.5–15.5)
WBC: 10.5 K/uL (ref 4.0–10.5)
nRBC: 0 % (ref 0.0–0.2)

## 2024-01-20 LAB — BASIC METABOLIC PANEL WITH GFR
Anion gap: 9 (ref 5–15)
BUN: 46 mg/dL — ABNORMAL HIGH (ref 8–23)
CO2: 26 mmol/L (ref 22–32)
Calcium: 8.4 mg/dL — ABNORMAL LOW (ref 8.9–10.3)
Chloride: 104 mmol/L (ref 98–111)
Creatinine, Ser: 1.53 mg/dL — ABNORMAL HIGH (ref 0.61–1.24)
GFR, Estimated: 47 mL/min — ABNORMAL LOW
Glucose, Bld: 121 mg/dL — ABNORMAL HIGH (ref 70–99)
Potassium: 4.6 mmol/L (ref 3.5–5.1)
Sodium: 139 mmol/L (ref 135–145)

## 2024-01-20 LAB — BODY FLUID CELL COUNT WITH DIFFERENTIAL
Eos, Fluid: 0 %
Lymphs, Fluid: 11 %
Monocyte-Macrophage-Serous Fluid: 9 % — ABNORMAL LOW (ref 50–90)
Neutrophil Count, Fluid: 80 % — ABNORMAL HIGH (ref 0–25)
Total Nucleated Cell Count, Fluid: 1003 uL — ABNORMAL HIGH (ref 0–1000)

## 2024-01-20 LAB — PROTIME-INR
INR: 2.1 — ABNORMAL HIGH (ref 0.8–1.2)
Prothrombin Time: 24.4 s — ABNORMAL HIGH (ref 11.4–15.2)

## 2024-01-20 MED ORDER — FUROSEMIDE 10 MG/ML IJ SOLN
40.0000 mg | Freq: Every day | INTRAMUSCULAR | Status: DC
  Administered 2024-01-20: 40 mg via INTRAVENOUS
  Filled 2024-01-20 (×2): qty 4

## 2024-01-20 MED ORDER — ENSURE PLUS HIGH PROTEIN PO LIQD
237.0000 mL | Freq: Two times a day (BID) | ORAL | Status: DC
  Administered 2024-01-20 – 2024-01-22 (×2): 237 mL via ORAL

## 2024-01-20 MED ORDER — LIDOCAINE HCL (PF) 1 % IJ SOLN
10.0000 mL | Freq: Once | INTRAMUSCULAR | Status: DC
  Filled 2024-01-20: qty 10

## 2024-01-20 MED ORDER — FUROSEMIDE 40 MG PO TABS
40.0000 mg | ORAL_TABLET | Freq: Every day | ORAL | Status: DC
  Administered 2024-01-21: 40 mg via ORAL
  Filled 2024-01-20: qty 1

## 2024-01-20 MED ORDER — LIDOCAINE HCL 1 % IJ SOLN
INTRAMUSCULAR | Status: AC
  Filled 2024-01-20: qty 10

## 2024-01-20 MED ORDER — ALBUMIN HUMAN 25 % IV SOLN
50.0000 g | Freq: Three times a day (TID) | INTRAVENOUS | Status: AC
  Administered 2024-01-20 – 2024-01-21 (×3): 50 g via INTRAVENOUS
  Filled 2024-01-20 (×3): qty 200

## 2024-01-20 MED ORDER — ONDANSETRON HCL 4 MG/2ML IJ SOLN
4.0000 mg | Freq: Four times a day (QID) | INTRAMUSCULAR | Status: DC | PRN
  Administered 2024-01-20 – 2024-01-21 (×2): 4 mg via INTRAVENOUS
  Filled 2024-01-20 (×3): qty 2

## 2024-01-20 MED ORDER — ALBUMIN HUMAN 25 % IV SOLN: 50.0000 g | Freq: Once | INTRAVENOUS | Status: DC

## 2024-01-20 MED ORDER — DEXTROSE 5 % IV SOLN: 2.0000 g | Freq: Two times a day (BID) | INTRAVENOUS | Status: DC

## 2024-01-20 MED ORDER — SODIUM CHLORIDE 0.9 % IV SOLN
2.0000 g | INTRAVENOUS | Status: DC
  Administered 2024-01-20 – 2024-01-25 (×6): 2 g via INTRAVENOUS
  Filled 2024-01-20 (×6): qty 20

## 2024-01-20 MED ORDER — ALBUMIN HUMAN 25 % IV SOLN
25.0000 g | Freq: Two times a day (BID) | INTRAVENOUS | Status: DC
  Administered 2024-01-20: 25 g via INTRAVENOUS
  Filled 2024-01-20 (×2): qty 100

## 2024-01-20 NOTE — Progress Notes (Addendum)
 "                        PROGRESS NOTE        PATIENT DETAILS Name: David Esparza Age: 74 y.o. Sex: male Date of Birth: Aug 04, 1950 Admit Date: 01/16/2024 Admitting Physician Sigurd Pac, MD ERE:Aobuy, Harlene LABOR, MD  Brief Summary: 74 year old with cirrhosis-recent TIPS-noncompliance with lactulose -presented with hepatic encephalopathy and AKI.   Significant events: 1/6>>admit to [facility]  Significant studies: 1/6>> CXR: Interval worsening/opacification over mid/right lower lung suggestive of moderate-sized pleural effusion. 1/6>> CT head: No acute intracranial abnormality 1/6>> US  ascites: Large volume ascites 1/6>> US  liver Doppler: Moderate ascites, TIPS stent in place, hepatic cirrhosis. 1/7>> IR abdomen US  limited: No significant pocket for paracentesis 1/8>> US  renal: No hydronephrosis 1/8>> CXR: Persistent large right pleural effusion.  Significant microbiology data: None  Procedures: None  Consults: GI  Subjective: Lying comfortably in bed-denies any chest pain or shortness of breath.  Apparently had 2 BMs yesterday.  Objective: Vitals: Blood pressure 107/67, pulse 86, temperature 98.4 F (36.9 C), temperature source Oral, resp. rate 14, height 5' 7 (1.702 m), weight 105 kg, SpO2 96%.   Exam: Gen Exam:Alert awake-not in any distress HEENT:atraumatic, normocephalic Chest: Decreased air entry on the right. CVS:S1S2 regular Abdomen: Soft-appears to have a bit more distention and more dullness in the flanks. Extremities:no edema Neurology: Non focal Skin: no rash  Pertinent Labs/Radiology:    Latest Ref Rng & Units 01/20/2024    4:46 AM 01/19/2024    3:09 AM 01/18/2024    3:58 AM  CBC  WBC 4.0 - 10.5 K/uL 10.5  7.5  6.8   Hemoglobin 13.0 - 17.0 g/dL 87.7  88.2  88.2   Hematocrit 39.0 - 52.0 % 34.5  33.1  32.8   Platelets 150 - 400 K/uL 58  42  41     Lab Results  Component Value Date   NA 139 01/20/2024   K 4.6 01/20/2024   CL 104 01/20/2024   CO2 26  01/20/2024      Assessment/Plan: Hepatic encephalopathy Recent TIPS-noncompliant/poorly compliant with lactulose  Continues to improve Continue lactulose /rifaximin    AKI Likely hemodynamically mediated-in the setting of poor oral intake due to hepatic encephalopathy-ongoing lisinopril  use. Improving with midodrine  (dosage titrated down) and intermittent doses of IV albumin   Hepatic hydrothorax Not very symptomatic On room air this morning On IV Lasix /IV albumin  Patient complaining of some more distention today-Will ask IR to reevaluate to see if there is enough fluid for paracentesis.   NASH liver cirrhosis Recent TIPS 12//25 Thrombocytopenia likely secondary to hypersplenism Not enough ascites for paracentesis seen on ultrasound-hence not felt to be SBP-however more distention today-hence will re-attempt paracentesis See above.  Addendum: Underwent paracentesis earlier-Total WBC around 1000 w 80% Neutrophils-starting Cefotaxime-await cultures. Already on albumin   Class 2 Obesity: Estimated body mass index is 36.26 kg/m as calculated from the following:   Height as of this encounter: 5' 7 (1.702 m).   Weight as of this encounter: 105 kg.   Code status:   Code Status: Full Code   DVT Prophylaxis: SCDs Start: 01/17/24 0147   Family Communication: Spouse-[phone] updated 1/10   Disposition Plan: Status is: Inpatient Remains inpatient appropriate because: Severity of illness   Planned Discharge Destination:Home   Diet: Diet Order             Diet 2 gram sodium Room service appropriate? Yes; Fluid consistency: Thin  Diet effective now  Antimicrobial agents: Anti-infectives (From admission, onward)    Start     Dose/Rate Route Frequency Ordered Stop   01/16/24 1445  rifaximin  (XIFAXAN ) tablet 550 mg        550 mg Oral 2 times daily 01/16/24 1440     01/16/24 1430  cefTRIAXone  (ROCEPHIN ) 2 g in sodium chloride  0.9 % 100 mL  IVPB  Status:  Discontinued        2 g 200 mL/hr over 30 Minutes Intravenous Every 24 hours 01/16/24 1420 01/16/24 1449        MEDICATIONS: Scheduled Meds:  feeding supplement  237 mL Oral BID BM   furosemide   40 mg Intravenous Daily   lactulose   30 g Oral TID   midodrine   2.5 mg Oral BID WC   rifaximin   550 mg Oral BID   Continuous Infusions:  albumin  human     PRN Meds:.albuterol    I have personally reviewed following labs and imaging studies  LABORATORY DATA: CBC: Recent Labs  Lab 01/16/24 1108 01/17/24 0202 01/18/24 0358 01/19/24 0309 01/20/24 0446  WBC 11.9* 7.7 6.8 7.5 10.5  HGB 15.5 12.5* 11.7* 11.7* 12.2*  HCT 43.0 34.8* 32.8* 33.1* 34.5*  MCV 101.4* 101.5* 101.2* 101.2* 102.4*  PLT 68* 44* 41* 42* 58*    Basic Metabolic Panel: Recent Labs  Lab 01/17/24 0202 01/17/24 0940 01/18/24 0358 01/19/24 0309 01/20/24 0446  NA 137 136 139 140 139  K 4.0 4.0 4.0 4.1 4.6  CL 102 103 105 106 104  CO2 22 21* 23 24 26   GLUCOSE 128* 130* 123* 132* 121*  BUN 65* 62* 54* 45* 46*  CREATININE 2.35* 2.09* 1.93* 1.66* 1.53*  CALCIUM  8.5* 8.2* 8.3* 8.3* 8.4*    GFR: Estimated Creatinine Clearance: 48.9 mL/min (A) (by C-G formula based on SCr of 1.53 mg/dL (H)).  Liver Function Tests: Recent Labs  Lab 01/16/24 1108 01/17/24 0202 01/17/24 0940 01/18/24 0358  AST 43* 29 28 25   ALT 25 18 18 16   ALKPHOS 99 72 83 67  BILITOT 3.8* 3.7* 3.1* 3.1*  PROT 7.1 6.0* 6.1* 5.8*  ALBUMIN  2.8* 2.9* 2.8* 3.0*   No results for input(s): LIPASE, AMYLASE in the last 168 hours. Recent Labs  Lab 01/16/24 1108 01/17/24 0202 01/17/24 1425  AMMONIA 70* 88* 64*    Coagulation Profile: Recent Labs  Lab 01/16/24 1633 01/17/24 0202 01/20/24 0446  INR 1.5* 1.7* 2.1*    Cardiac Enzymes: No results for input(s): CKTOTAL, CKMB, CKMBINDEX, TROPONINI in the last 168 hours.  BNP (last 3 results) No results for input(s): PROBNP in the last 8760  hours.  Lipid Profile: No results for input(s): CHOL, HDL, LDLCALC, TRIG, CHOLHDL, LDLDIRECT in the last 72 hours.  Thyroid  Function Tests: No results for input(s): TSH, T4TOTAL, FREET4, T3FREE, THYROIDAB in the last 72 hours.  Anemia Panel: No results for input(s): VITAMINB12, FOLATE, FERRITIN, TIBC, IRON, RETICCTPCT in the last 72 hours.  Urine analysis:    Component Value Date/Time   COLORURINE YELLOW 01/16/2024 2158   APPEARANCEUR CLEAR 01/16/2024 2158   LABSPEC 1.014 01/16/2024 2158   PHURINE 5.0 01/16/2024 2158   GLUCOSEU NEGATIVE 01/16/2024 2158   GLUCOSEU NEGATIVE 03/08/2023 1144   HGBUR NEGATIVE 01/16/2024 2158   BILIRUBINUR NEGATIVE 01/16/2024 2158   BILIRUBINUR neg 02/21/2012 0819   KETONESUR NEGATIVE 01/16/2024 2158   PROTEINUR NEGATIVE 01/16/2024 2158   UROBILINOGEN 2.0 (A) 03/08/2023 1144   NITRITE NEGATIVE 01/16/2024 2158   LEUKOCYTESUR NEGATIVE 01/16/2024 2158  Sepsis Labs: Lactic Acid, Venous    Component Value Date/Time   LATICACIDVEN 1.7 12/14/2023 1821    MICROBIOLOGY: No results found for this or any previous visit (from the past 240 hours).  RADIOLOGY STUDIES/RESULTS: DG CHEST PORT 1 VIEW Result Date: 01/18/2024 EXAM: 1 VIEW(S) XRAY OF THE CHEST 01/18/2024 04:14:00 PM COMPARISON: 01/16/2024 CLINICAL HISTORY: Labored breathing FINDINGS: LUNGS AND PLEURA: Persistent large right pleural effusion with associated hazy opacity throughout the right lung, likely reflecting compressive atelectasis. No pneumothorax. HEART AND MEDIASTINUM: Aortic atherosclerosis. No acute abnormality of the cardiac and mediastinal silhouettes. BONES AND SOFT TISSUES: No acute osseous abnormality. IMPRESSION: 1. Persistent large right pleural effusion with associated hazy opacity throughout the right lung, likely reflecting compressive atelectasis. Electronically signed by: Greig Pique MD MD 01/18/2024 07:54 PM EST RP Workstation: [id number]    US  RENAL Result Date: 01/18/2024 CLINICAL DATA:  Acute kidney injury EXAM: RENAL / URINARY TRACT ULTRASOUND COMPLETE COMPARISON:  None Available. FINDINGS: Right Kidney: Renal measurements: 9.6 cm x 5.3 cm x 5.3 cm = volume: 139.9 mL. Echogenicity within normal limits. No mass or hydronephrosis visualized. Left Kidney: Renal measurements: 10.9 cm x 6.1 cm x 6.0 cm = volume: 210.9 mL. Echogenicity within normal limits. No mass or hydronephrosis visualized. Bladder: Appears normal for degree of bladder distention. Other: Of incidental note is the presence of ascites throughout the abdomen. The study is technically limited secondary to the patient's body habitus and lack of patient cooperation, as per the ultrasound technologist. IMPRESSION: 1. Ascites. 2. Normal ultrasonographic appearance of the kidneys. Electronically Signed   By: Suzen Dials M.D.   On: 01/18/2024 14:53     LOS: 4 days   Donalda Applebaum, MD  [facility]    To contact the attending provider between 7A-7P or the covering provider during after hours 7P-7A, please log into the web site [email or web address] and access using universal [place] password for that web site. If you do not have the password, please call the hospital operator.  01/20/2024, 9:44 AM    "

## 2024-01-20 NOTE — Procedures (Signed)
 Ultrasound-guided diagnostic and therapeutic paracentesis performed yielding 3 liters of straw colored fluid.  Fluid was sent to lab for analysis. No immediate complications. EBL is none. 3 Liter maximum

## 2024-01-20 NOTE — Plan of Care (Signed)

## 2024-01-20 NOTE — Progress Notes (Signed)
 [facility] ROUNDING NOTE   Subjective: Patient underwent repeat ultrasound to reassess for ascites, and was found to have ascites amenable to paracentesis.  Ascitic fluid showed significant leukocytosis consistent with SBP.  Patient was started on ceftriaxone  and given scheduled albumin . The patient tells me that he feels better after getting fluid out.  He has been very somnolent and inactive today per his wife and nursing staff.  He denies difficulty breathing.  He remains oriented x 2.  He has had 3 bowel movements today per nursing.   Objective: Vital signs in last 24 hours: Temp:  [97.7 F (36.5 C)-98.6 F (37 C)] 97.7 F (36.5 C) (01/10 2015) Pulse Rate:  [86-98] 98 (01/10 2015) Resp:  [14-23] 16 (01/10 2015) BP: (102-126)/(67-80) 107/80 (01/10 2015) SpO2:  [91 %-96 %] 91 % (01/10 2015) Last BM Date : 01/20/24 General: NAD, chronically ill-appearing Caucasian male, wife at bedside Lungs: Decreased breath sounds on right base b/l, no w/r/r Heart:  RRR, no m/r/g Abdomen:  Soft, NT, mildly distended, +BS Ext: Bilateral edema/anasarca    Intake/Output from previous day: 01/09 0701 - 01/10 0700 In: -  Out: 1650 [Urine:1650] Intake/Output this shift: No intake/output data recorded.   Lab Results: Recent Labs    01/18/24 0358 01/19/24 0309 01/20/24 0446  WBC 6.8 7.5 10.5  HGB 11.7* 11.7* 12.2*  PLT 41* 42* 58*  MCV 101.2* 101.2* 102.4*   BMET Recent Labs    01/18/24 0358 01/19/24 0309 01/20/24 0446  NA 139 140 139  K 4.0 4.1 4.6  CL 105 106 104  CO2 23 24 26   GLUCOSE 123* 132* 121*  BUN 54* 45* 46*  CREATININE 1.93* 1.66* 1.53*  CALCIUM  8.3* 8.3* 8.4*   LFT Recent Labs    01/18/24 0358  PROT 5.8*  ALBUMIN  3.0*  AST 25  ALT 16  ALKPHOS 67  BILITOT 3.1*   PT/INR Recent Labs    01/20/24 0446  INR 2.1*      Imaging/Other results: US  Paracentesis Result Date: 01/20/2024 INDICATION: 74 year old male. History of cirrhosis status  post tips. Found to have ascites. Request for therapeutic and diagnostic paracentesis. 3 L maximum EXAM: ULTRASOUND GUIDED THERAPEUTIC AND DIAGNOSTIC PARACENTESIS MEDICATIONS: Lidocaine  1% 10 mL COMPLICATIONS: None immediate. PROCEDURE: Informed written consent was obtained from the patient after a discussion of the risks, benefits and alternatives to treatment. A timeout was performed prior to the initiation of the procedure. Initial ultrasound scanning demonstrates a moderate amount of ascites within the right lower abdominal quadrant. The right lower abdomen was prepped and draped in the usual sterile fashion. 1% lidocaine  was used for local anesthesia. Following this, a 19 gauge, 7-cm, Yueh catheter was introduced. An ultrasound image was saved for documentation purposes. The paracentesis was performed. The catheter was removed and a dressing was applied. The patient tolerated the procedure well without immediate post procedural complication. FINDINGS: A total of approximately 3 liters of straw-colored fluid was removed. Samples were sent to the laboratory as requested by the clinical team. IMPRESSION: Successful ultrasound-guided paracentesis yielding 3 liters of straw-colored peritoneal fluid. Performed by Delon Beagle NP Electronically Signed   By: Wilkie Lent M.D.   On: 01/20/2024 12:33   MELD 3.0: 23 at 01/20/2024  4:46 AM MELD-Na: 23 at 01/20/2024  4:46 AM Calculated from: Serum Creatinine: 1.53 mg/dL at 8/89/7973  5:53 AM Serum Sodium: 139 mmol/L (Using max of 137 mmol/L) at 01/20/2024  4:46 AM Total Bilirubin: 3.1 mg/dL at 08/11/7971  6:41 AM Serum  Albumin : 3 g/dL at 08/11/7971  6:41 AM INR(ratio): 2.1 at 01/20/2024  4:46 AM Age at listing (hypothetical): 80 years Sex: Male at 01/20/2024  4:46 AM     Assessment and Plan:   74 year old male with decompensated MASH cirrhosis with refractory ascites and hepatic hydrothorax status post TIPS December 4, admitted with encephalopathy,  generalized weakness and acute kidney injury.   Hepatic encephalopathy At increased risk status post TIPS, compounded by medication nonadherence with lactulose  at home - Slowly improving with Xifaxan  and lactulose  - Continue Xifaxan  550 mg twice daily.  Would recommend he stay on this indefinitely - Continue lactulose  3 times daily for now.  Bowel movements should be documented in the chart to help guide therapy.    SBP Diagnosed today with cell count showing PMNs of 800.  No organisms or WBCs seen on body fluid culture - Ceftriaxone  started today - Albumin  1.5 g/kg today - Recommend repeat paracentesis in 3 days   Acute kidney injury Creatinine 2.6 on admission from baseline 1.2.  Improving with albumin  - Agree with restarting Lasix  given improvement in renal function and worsening ascites/hepatic hydrothorax - IV Lasix  may be more effective in setting of anasarca  Hepatic hydrothorax, status post TIPS - Chest x-ray 1/9 showed increasing effusion - Lasix  20 mg IV given 1/9 -Expect hepatic hydrothorax to continue to improve with lessening diuretic requirement given his newly placed TIPS   Decompensated cirrhosis with refractory ascites/hepatic hydrothorax MELD 25 Overall poor prognosis - TIPS patent on Doppler ultrasound - Expect it will continue to improve his fluid status and reduce risk of variceal bleed, but hepatic encephalopathy will continue to be an issue, particularly if he is not adherent with medications    Glendia FORBES Holt, MD  01/20/2024, 10:06 PM [facility]

## 2024-01-20 NOTE — Plan of Care (Signed)
  Problem: Nutrition: Goal: Adequate nutrition will be maintained Outcome: Progressing   Problem: Skin Integrity: Goal: Risk for impaired skin integrity will decrease Outcome: Progressing   

## 2024-01-20 NOTE — Progress Notes (Signed)
 Attempted patient to reposition in the chair but he refused.

## 2024-01-21 DIAGNOSIS — K652 Spontaneous bacterial peritonitis: Secondary | ICD-10-CM | POA: Diagnosis not present

## 2024-01-21 DIAGNOSIS — K7681 Hepatopulmonary syndrome: Secondary | ICD-10-CM | POA: Diagnosis not present

## 2024-01-21 DIAGNOSIS — K7581 Nonalcoholic steatohepatitis (NASH): Secondary | ICD-10-CM | POA: Diagnosis not present

## 2024-01-21 DIAGNOSIS — K7682 Hepatic encephalopathy: Secondary | ICD-10-CM | POA: Diagnosis not present

## 2024-01-21 DIAGNOSIS — K922 Gastrointestinal hemorrhage, unspecified: Secondary | ICD-10-CM

## 2024-01-21 DIAGNOSIS — K7469 Other cirrhosis of liver: Secondary | ICD-10-CM | POA: Diagnosis not present

## 2024-01-21 DIAGNOSIS — N179 Acute kidney failure, unspecified: Secondary | ICD-10-CM | POA: Diagnosis not present

## 2024-01-21 LAB — TYPE AND SCREEN
ABO/RH(D): O NEG
Antibody Screen: NEGATIVE

## 2024-01-21 LAB — BASIC METABOLIC PANEL WITH GFR
Anion gap: 13 (ref 5–15)
BUN: 57 mg/dL — ABNORMAL HIGH (ref 8–23)
CO2: 23 mmol/L (ref 22–32)
Calcium: 8.5 mg/dL — ABNORMAL LOW (ref 8.9–10.3)
Chloride: 109 mmol/L (ref 98–111)
Creatinine, Ser: 1.71 mg/dL — ABNORMAL HIGH (ref 0.61–1.24)
GFR, Estimated: 41 mL/min — ABNORMAL LOW
Glucose, Bld: 148 mg/dL — ABNORMAL HIGH (ref 70–99)
Potassium: 4.6 mmol/L (ref 3.5–5.1)
Sodium: 145 mmol/L (ref 135–145)

## 2024-01-21 LAB — CBC WITH DIFFERENTIAL/PLATELET
Abs Immature Granulocytes: 0.06 K/uL (ref 0.00–0.07)
Abs Immature Granulocytes: 0.15 K/uL — ABNORMAL HIGH (ref 0.00–0.07)
Basophils Absolute: 0 K/uL (ref 0.0–0.1)
Basophils Absolute: 0 K/uL (ref 0.0–0.1)
Basophils Relative: 0 %
Basophils Relative: 0 %
Eosinophils Absolute: 0 K/uL (ref 0.0–0.5)
Eosinophils Absolute: 0.1 K/uL (ref 0.0–0.5)
Eosinophils Relative: 0 %
Eosinophils Relative: 1 %
HCT: 25 % — ABNORMAL LOW (ref 39.0–52.0)
HCT: 24.4 % — ABNORMAL LOW (ref 39.0–52.0)
Hemoglobin: 8.6 g/dL — ABNORMAL LOW (ref 13.0–17.0)
Hemoglobin: 8.3 g/dL — ABNORMAL LOW (ref 13.0–17.0)
Immature Granulocytes: 1 %
Immature Granulocytes: 1 %
Lymphocytes Relative: 5 %
Lymphocytes Relative: 5 %
Lymphs Abs: 0.4 K/uL — ABNORMAL LOW (ref 0.7–4.0)
Lymphs Abs: 0.5 K/uL — ABNORMAL LOW (ref 0.7–4.0)
MCH: 36.4 pg — ABNORMAL HIGH (ref 26.0–34.0)
MCH: 35.8 pg — ABNORMAL HIGH (ref 26.0–34.0)
MCHC: 34.4 g/dL (ref 30.0–36.0)
MCHC: 34 g/dL (ref 30.0–36.0)
MCV: 105.9 fL — ABNORMAL HIGH (ref 80.0–100.0)
MCV: 105.2 fL — ABNORMAL HIGH (ref 80.0–100.0)
Monocytes Absolute: 0.3 K/uL (ref 0.1–1.0)
Monocytes Absolute: 0.7 K/uL (ref 0.1–1.0)
Monocytes Relative: 4 %
Monocytes Relative: 6 %
Neutro Abs: 8.1 K/uL — ABNORMAL HIGH (ref 1.7–7.7)
Neutro Abs: 9.5 K/uL — ABNORMAL HIGH (ref 1.7–7.7)
Neutrophils Relative %: 90 %
Neutrophils Relative %: 87 %
Platelets: 38 K/uL — ABNORMAL LOW (ref 150–400)
Platelets: 47 K/uL — ABNORMAL LOW (ref 150–400)
RBC: 2.36 MIL/uL — ABNORMAL LOW (ref 4.22–5.81)
RBC: 2.32 MIL/uL — ABNORMAL LOW (ref 4.22–5.81)
RDW: 16 % — ABNORMAL HIGH (ref 11.5–15.5)
RDW: 16.1 % — ABNORMAL HIGH (ref 11.5–15.5)
WBC: 9 K/uL (ref 4.0–10.5)
WBC: 10.9 K/uL — ABNORMAL HIGH (ref 4.0–10.5)
nRBC: 0 % (ref 0.0–0.2)
nRBC: 0 % (ref 0.0–0.2)

## 2024-01-21 LAB — HEMOGLOBIN AND HEMATOCRIT, BLOOD
HCT: 24.9 % — ABNORMAL LOW (ref 39.0–52.0)
Hemoglobin: 8.6 g/dL — ABNORMAL LOW (ref 13.0–17.0)

## 2024-01-21 LAB — CBC
HCT: 30 % — ABNORMAL LOW (ref 39.0–52.0)
Hemoglobin: 10.3 g/dL — ABNORMAL LOW (ref 13.0–17.0)
MCH: 36 pg — ABNORMAL HIGH (ref 26.0–34.0)
MCHC: 34.3 g/dL (ref 30.0–36.0)
MCV: 104.9 fL — ABNORMAL HIGH (ref 80.0–100.0)
Platelets: 57 K/uL — ABNORMAL LOW (ref 150–400)
RBC: 2.86 MIL/uL — ABNORMAL LOW (ref 4.22–5.81)
RDW: 15.9 % — ABNORMAL HIGH (ref 11.5–15.5)
WBC: 15.2 K/uL — ABNORMAL HIGH (ref 4.0–10.5)
nRBC: 0 % (ref 0.0–0.2)

## 2024-01-21 MED ORDER — MIDODRINE HCL 5 MG PO TABS
10.0000 mg | ORAL_TABLET | Freq: Three times a day (TID) | ORAL | Status: DC
  Administered 2024-01-21 (×3): 10 mg via ORAL
  Filled 2024-01-21 (×3): qty 2

## 2024-01-21 MED ORDER — LACTULOSE 10 GM/15ML PO SOLN
20.0000 g | Freq: Three times a day (TID) | ORAL | Status: DC
  Administered 2024-01-21: 20 g via ORAL
  Filled 2024-01-21: qty 30

## 2024-01-21 MED ORDER — SODIUM CHLORIDE 0.9 % IV SOLN
50.0000 ug/h | INTRAVENOUS | Status: AC
  Administered 2024-01-21 – 2024-01-24 (×7): 50 ug/h via INTRAVENOUS
  Filled 2024-01-21 (×7): qty 1

## 2024-01-21 MED ORDER — OCTREOTIDE LOAD VIA INFUSION
50.0000 ug | Freq: Once | INTRAVENOUS | Status: AC
  Administered 2024-01-21: 50 ug via INTRAVENOUS
  Filled 2024-01-21: qty 25

## 2024-01-21 MED ORDER — PANTOPRAZOLE SODIUM 40 MG IV SOLR
40.0000 mg | Freq: Two times a day (BID) | INTRAVENOUS | Status: DC
  Administered 2024-01-21 – 2024-01-25 (×10): 40 mg via INTRAVENOUS
  Filled 2024-01-21 (×10): qty 10

## 2024-01-21 NOTE — Progress Notes (Signed)
 "                        PROGRESS NOTE        PATIENT DETAILS Name: David Esparza Age: 74 y.o. Sex: male Date of Birth: Aug 17, 1950 Admit Date: 01/16/2024 Admitting Physician Sigurd Pac, MD ERE:Aobuy, Harlene LABOR, MD  Brief Summary:  74 year old with cirrhosis-recent TIPS-noncompliance with lactulose -presented with hepatic encephalopathy and AKI.   Significant events:  1/6>>admit to [facility]  Significant studies:  1/6>> CXR: Interval worsening/opacification over mid/right lower lung suggestive of moderate-sized pleural effusion. 1/6>> CT head: No acute intracranial abnormality 1/6>> US  ascites: Large volume ascites 1/6>> US  liver Doppler: Moderate ascites, TIPS stent in place, hepatic cirrhosis. 1/7>> IR abdomen US  limited: No significant pocket for paracentesis 1/8>> US  renal: No hydronephrosis 1/8>> CXR: Persistent large right pleural effusion.  Significant microbiology data: None  Procedures: None  Consults: GI  Subjective:  Patient in bed denies any headache chest or abdominal pain, says had 9-10 bowel movements last night, no shortness of breath, had some nausea vomiting with dark emesis x 2 on 01/20/2024.  Objective:  Vitals: Blood pressure 101/66, pulse 95, temperature 98.8 F (37.1 C), temperature source Oral, resp. rate 18, height 5' 7 (1.702 m), weight 105 kg, SpO2 95%.   Exam:  Awake Alert, No new F.N deficits, Normal affect David Esparza,PERRAL Supple Neck, No JVD,   Symmetrical Chest wall movement, Good air movement bilaterally, CTAB RRR,No Gallops, Rubs or new Murmurs,  +ve B.Sounds, Abd Soft, No tenderness,   No Cyanosis, Clubbing or edema    Assessment/Plan:  Hepatic encephalopathy Recent TIPS-noncompliant/poorly compliant with lactulose  Continues to improve Continue lactulose /rifaximin , dose adjusted on 01/21/2024, mentation has improved continue to monitor.   AKI Likely hemodynamically mediated-in the setting of poor oral intake due to hepatic  encephalopathy-ongoing lisinopril  use. Improving with midodrine  (dosage titrated down) and intermittent doses of IV albumin   Hepatic hydrothorax Not very symptomatic On room air this morning On IV Lasix /IV albumin  Underwent diagnostic and therapeutic paracentesis on 01/20/2023 with 3-1/2 L of fluid removed, fluid also suggestive of SBP.   NASH liver cirrhosis Recent TIPS 12//25 Thrombocytopenia likely secondary to hypersplenism, SBP Underwent paracentesis fluid suggestive of SBP, on appropriate antibiotics, follow cultures.  Dark emesis x 2 01/20/2024.  With history of Hollie and portal hypertension has been placed on IV PPI and octreotide , type screen done will monitor CBC, keep him n.p.o., GI informed, if needed transfuse.  Patient and family agreeable for transfusion.  Monitor.    Class 2 Obesity: Estimated body mass index is 36.26 kg/m as calculated from the following:   Height as of this encounter: 5' 7 (1.702 m).   Weight as of this encounter: 105 kg.   Code status:   Code Status: Full Code   DVT Prophylaxis: SCDs Start: 01/17/24 0147   Family Communication: Spouse-Sharon and patient's daughter updated via room phone x 2 on 01/21/2024.   Disposition Plan: Status is: Inpatient Remains inpatient appropriate because: Severity of illness   Planned Discharge Destination:Home   Diet: Diet Order             Diet NPO time specified Except for: Sips with Meds, Ice Chips  Diet effective now                     Antimicrobial agents: Anti-infectives (From admission, onward)    Start     Dose/Rate Route Frequency Ordered Stop   01/20/24 1545  cefTRIAXone  (  ROCEPHIN ) 2 g in sodium chloride  0.9 % 100 mL IVPB        2 g 200 mL/hr over 30 Minutes Intravenous Every 24 hours 01/20/24 1456     01/20/24 1530  cefoTAXime (CLAFORAN) 2 g in dextrose  5 % 50 mL IVPB  Status:  Discontinued        2 g 140 mL/hr over 30 Minutes Intravenous Every 12 hours 01/20/24 1431 01/20/24 1455    01/16/24 1445  rifaximin  (XIFAXAN ) tablet 550 mg        550 mg Oral 2 times daily 01/16/24 1440     01/16/24 1430  cefTRIAXone  (ROCEPHIN ) 2 g in sodium chloride  0.9 % 100 mL IVPB  Status:  Discontinued        2 g 200 mL/hr over 30 Minutes Intravenous Every 24 hours 01/16/24 1420 01/16/24 1449       MEDICATIONS: Scheduled Meds:  feeding supplement  237 mL Oral BID BM   furosemide   40 mg Oral Daily   lactulose   20 g Oral TID   lidocaine  (PF)  10 mL Intradermal Once   midodrine   10 mg Oral TID WC   pantoprazole  (PROTONIX ) IV  40 mg Intravenous Q12H   rifaximin   550 mg Oral BID   Continuous Infusions:  albumin  human 50 g (01/21/24 0150)   cefTRIAXone  (ROCEPHIN )  IV Stopped (01/20/24 1707)   octreotide  (SANDOSTATIN ) 500 mcg in sodium chloride  0.9 % 250 mL (2 mcg/mL) infusion 50 mcg/hr (01/21/24 0659)   PRN Meds:.albuterol , ondansetron  (ZOFRAN ) IV   I have personally reviewed following labs and imaging studies  LABORATORY DATA: CBC: Recent Labs  Lab 01/17/24 0202 01/18/24 0358 01/19/24 0309 01/20/24 0446 01/21/24 0348 01/21/24 0820  WBC 7.7 6.8 7.5 10.5 15.2*  --   HGB 12.5* 11.7* 11.7* 12.2* 10.3* 8.6*  HCT 34.8* 32.8* 33.1* 34.5* 30.0* 24.9*  MCV 101.5* 101.2* 101.2* 102.4* 104.9*  --   PLT 44* 41* 42* 58* 57*  --     Basic Metabolic Panel: Recent Labs  Lab 01/17/24 0940 01/18/24 0358 01/19/24 0309 01/20/24 0446 01/21/24 0348  NA 136 139 140 139 145  K 4.0 4.0 4.1 4.6 4.6  CL 103 105 106 104 109  CO2 21* 23 24 26 23   GLUCOSE 130* 123* 132* 121* 148*  BUN 62* 54* 45* 46* 57*  CREATININE 2.09* 1.93* 1.66* 1.53* 1.71*  CALCIUM  8.2* 8.3* 8.3* 8.4* 8.5*    GFR: Estimated Creatinine Clearance: 43.8 mL/min (A) (by C-G formula based on SCr of 1.71 mg/dL (H)).  Liver Function Tests: Recent Labs  Lab 01/16/24 1108 01/17/24 0202 01/17/24 0940 01/18/24 0358  AST 43* 29 28 25   ALT 25 18 18 16   ALKPHOS 99 72 83 67  BILITOT 3.8* 3.7* 3.1* 3.1*  PROT 7.1  6.0* 6.1* 5.8*  ALBUMIN  2.8* 2.9* 2.8* 3.0*   No results for input(s): LIPASE, AMYLASE in the last 168 hours. Recent Labs  Lab 01/16/24 1108 01/17/24 0202 01/17/24 1425  AMMONIA 70* 88* 64*    Coagulation Profile: Recent Labs  Lab 01/16/24 1633 01/17/24 0202 01/20/24 0446  INR 1.5* 1.7* 2.1*    Cardiac Enzymes: No results for input(s): CKTOTAL, CKMB, CKMBINDEX, TROPONINI in the last 168 hours.  BNP (last 3 results) No results for input(s): PROBNP in the last 8760 hours.  Lipid Profile: No results for input(s): CHOL, HDL, LDLCALC, TRIG, CHOLHDL, LDLDIRECT in the last 72 hours.  Thyroid  Function Tests: No results for input(s): TSH, T4TOTAL, FREET4, T3FREE,  THYROIDAB in the last 72 hours.  Anemia Panel: No results for input(s): VITAMINB12, FOLATE, FERRITIN, TIBC, IRON, RETICCTPCT in the last 72 hours.  Urine analysis:    Component Value Date/Time   COLORURINE YELLOW 01/16/2024 2158   APPEARANCEUR CLEAR 01/16/2024 2158   LABSPEC 1.014 01/16/2024 2158   PHURINE 5.0 01/16/2024 2158   GLUCOSEU NEGATIVE 01/16/2024 2158   GLUCOSEU NEGATIVE 03/08/2023 1144   HGBUR NEGATIVE 01/16/2024 2158   BILIRUBINUR NEGATIVE 01/16/2024 2158   BILIRUBINUR neg 02/21/2012 0819   KETONESUR NEGATIVE 01/16/2024 2158   PROTEINUR NEGATIVE 01/16/2024 2158   UROBILINOGEN 2.0 (A) 03/08/2023 1144   NITRITE NEGATIVE 01/16/2024 2158   LEUKOCYTESUR NEGATIVE 01/16/2024 2158    Sepsis Labs: Lactic Acid, Venous    Component Value Date/Time   LATICACIDVEN 1.7 12/14/2023 1821    MICROBIOLOGY: Recent Results (from the past 240 hours)  Body fluid culture w Gram Stain     Status: None (Preliminary result)   Collection Time: 01/20/24 12:35 PM   Specimen: PATH Cytology Peritoneal fluid  Result Value Ref Range Status   Specimen Description PERITONEAL  Final   Special Requests NONE  Final   Gram Stain   Final    NO WBC SEEN NO ORGANISMS  SEEN Performed at [facility], [address], [place], [place] [address]    Culture PENDING  Incomplete   Report Status PENDING  Incomplete    RADIOLOGY STUDIES/RESULTS: US  Paracentesis Result Date: 01/20/2024 INDICATION: 74 year old male. History of cirrhosis status post tips. Found to have ascites. Request for therapeutic and diagnostic paracentesis. 3 L maximum EXAM: ULTRASOUND GUIDED THERAPEUTIC AND DIAGNOSTIC PARACENTESIS MEDICATIONS: Lidocaine  1% 10 mL COMPLICATIONS: None immediate. PROCEDURE: Informed written consent was obtained from the patient after a discussion of the risks, benefits and alternatives to treatment. A timeout was performed prior to the initiation of the procedure. Initial ultrasound scanning demonstrates a moderate amount of ascites within the right lower abdominal quadrant. The right lower abdomen was prepped and draped in the usual sterile fashion. 1% lidocaine  was used for local anesthesia. Following this, a 19 gauge, 7-cm, Yueh catheter was introduced. An ultrasound image was saved for documentation purposes. The paracentesis was performed. The catheter was removed and a dressing was applied. The patient tolerated the procedure well without immediate post procedural complication. FINDINGS: A total of approximately 3 liters of straw-colored fluid was removed. Samples were sent to the laboratory as requested by the clinical team. IMPRESSION: Successful ultrasound-guided paracentesis yielding 3 liters of straw-colored peritoneal fluid. Performed by Delon Beagle NP Electronically Signed   By: Wilkie Lent M.D.   On: 01/20/2024 12:33     LOS: 5 days   Lavada Stank, MD  [facility]    To contact the attending provider between 7A-7P or the covering provider during after hours 7P-7A, please log into the web site [email or web address] and access using universal [place] password for that web site. If you do not have the password, please call the hospital  operator.  01/21/2024, 10:32 AM    "

## 2024-01-21 NOTE — Progress Notes (Signed)
 Notified that this patient with cirrhosis and esophageal varices vomited ~100 mL black liquid. There is none available now to test for occult blood.   He remains hemodynamically stable. Hgb is 10.3 (12.2 yesterday morning). BUN has increased but so has creatinine.   He is already on Rocephin  for SBP. Plan to keep NPO for now, start octreotide  and PPI, and trend H&H.

## 2024-01-21 NOTE — Plan of Care (Signed)
  Problem: Clinical Measurements: Goal: Ability to maintain clinical measurements within normal limits will improve Outcome: Progressing Goal: Will remain free from infection Outcome: Progressing   Problem: Activity: Goal: Risk for activity intolerance will decrease Outcome: Progressing   Problem: Nutrition: Goal: Adequate nutrition will be maintained Outcome: Progressing   Problem: Elimination: Goal: Will not experience complications related to bowel motility Outcome: Progressing

## 2024-01-21 NOTE — Plan of Care (Signed)
   Problem: Education: Goal: Knowledge of General Education information will improve Description: Including pain rating scale, medication(s)/side effects and non-pharmacologic comfort measures Outcome: Progressing   Problem: Clinical Measurements: Goal: Respiratory complications will improve Outcome: Progressing

## 2024-01-21 NOTE — Progress Notes (Signed)
 Received verbal order from Dr Dennise to place flexiseal on the patient as he is having continuous diarrhea.

## 2024-01-21 NOTE — Progress Notes (Signed)
 SLP Cancellation Note  Patient Details Name: David Esparza MRN: [id number] DOB: 1950/12/20   Cancelled treatment:       Reason Eval/Treat Not Completed: Medical issues which prohibited therapy.  Pt vomited black liquid this morning and is currently NPO until GI can evalaute.  SLP will f/u for BSE as medically appropriate.     Earnie SQUIBB Sequ[place]ia Witz 01/21/2024, 10:37 AM

## 2024-01-21 NOTE — Progress Notes (Signed)
 [facility] ROUNDING NOTE   Subjective: Patient episode of dark emesis overnight and again this morning.  His hgb has dropped from 12 to 10 to 8.6 and his BUN has increased to 57 from 45. A flexiseal was placed this morning because of excessive stool and there is dark stool present.  Blood pressures have been soft, with systolics around 100. He was started on octreotide  early this morning.   Objective: Vital signs in last 24 hours: Temp:  [97.7 F (36.5 C)-98.8 F (37.1 C)] 98.8 F (37.1 C) (01/11 0800) Pulse Rate:  [91-106] 95 (01/11 0800) Resp:  [13-24] 18 (01/11 0800) BP: (98-115)/(64-80) 101/66 (01/11 0800) SpO2:  [91 %-96 %] 95 % (01/11 0800) Last BM Date : 01/20/24 General: Frail, ill appearing Caucasian male, wife daughter and son and law at bedside Lungs:  Decreased breath sounds on R, crackles at left lung base Heart: RRR, no m/r/g Abdomen:  Soft, NT, mildly distended, +BS Ext:  B/l edema Neuro:  Alert, oriented to self and place, but not date.  No asterixis today    Intake/Output from previous day: 01/10 0701 - 01/11 0700 In: 284.8 [IV Piggyback:284.8] Out: 1550 [Urine:1450; Emesis/NG output:100] Intake/Output this shift: No intake/output data recorded.   Lab Results: Recent Labs    01/20/24 0446 01/21/24 0348 01/21/24 0820 01/21/24 1155  WBC 10.5 15.2*  --  9.0  HGB 12.2* 10.3* 8.6* 8.6*  PLT 58* 57*  --  38*  MCV 102.4* 104.9*  --  105.9*   BMET Recent Labs    01/19/24 0309 01/20/24 0446 01/21/24 0348  NA 140 139 145  K 4.1 4.6 4.6  CL 106 104 109  CO2 24 26 23   GLUCOSE 132* 121* 148*  BUN 45* 46* 57*  CREATININE 1.66* 1.53* 1.71*  CALCIUM  8.3* 8.4* 8.5*   LFT No results for input(s): PROT, ALBUMIN , AST, ALT, ALKPHOS, BILITOT, BILIDIR, IBILI in the last 72 hours. PT/INR Recent Labs    01/20/24 0446  INR 2.1*      Imaging/Other results: US  Paracentesis Result Date: 01/20/2024 INDICATION: 74 year old  male. History of cirrhosis status post tips. Found to have ascites. Request for therapeutic and diagnostic paracentesis. 3 L maximum EXAM: ULTRASOUND GUIDED THERAPEUTIC AND DIAGNOSTIC PARACENTESIS MEDICATIONS: Lidocaine  1% 10 mL COMPLICATIONS: None immediate. PROCEDURE: Informed written consent was obtained from the patient after a discussion of the risks, benefits and alternatives to treatment. A timeout was performed prior to the initiation of the procedure. Initial ultrasound scanning demonstrates a moderate amount of ascites within the right lower abdominal quadrant. The right lower abdomen was prepped and draped in the usual sterile fashion. 1% lidocaine  was used for local anesthesia. Following this, a 19 gauge, 7-cm, Yueh catheter was introduced. An ultrasound image was saved for documentation purposes. The paracentesis was performed. The catheter was removed and a dressing was applied. The patient tolerated the procedure well without immediate post procedural complication. FINDINGS: A total of approximately 3 liters of straw-colored fluid was removed. Samples were sent to the laboratory as requested by the clinical team. IMPRESSION: Successful ultrasound-guided paracentesis yielding 3 liters of straw-colored peritoneal fluid. Performed by Delon Beagle NP Electronically Signed   By: Wilkie Lent M.D.   On: 01/20/2024 12:33   MELD 3.0: 22 at 01/19/2024  3:09 AM MELD-Na: 22 at 01/19/2024  3:09 AM Calculated from: Serum Creatinine: 1.66 mg/dL at 8/0/7973  6:90 AM Serum Sodium: 140 mmol/L (Using max of 137 mmol/L) at 01/19/2024  3:09 AM Total Bilirubin: 3.1  mg/dL at 08/11/7971  6:41 AM Serum Albumin : 3 g/dL at 08/11/7971  6:41 AM INR(ratio): 1.7 at 01/17/2024  2:02 AM Age at listing (hypothetical): 3 years Sex: Male at 01/19/2024  3:09 AM     Assessment and Plan:  74 year old male with decompensated MASH cirrhosis with refractory ascites and hepatic hydrothorax status post TIPS December 4, admitted  with encephalopathy, generalized weakness and acute kidney injury.  Hospitalization complicated by SBP and now upper GI bleed   Hepatic encephalopathy; presenting problem At increased risk status post TIPS, compounded by medication nonadherence with lactulose  at home - Improved with Xifaxan  and lactulose  but still not back to baseline - Continue Xifaxan  550 mg twice daily.  Would recommend he stay on this indefinitely - Lactulose  on hold in setting of excessive stool (which may be more secondary to bleeding than excessive lactulose )   SBP Diagnosed 1/10 (initial attempt of paracentesis with no adequate fluid pocket) with cell count showing PMNs of 800.  No organisms or WBCs seen on body fluid culture - Ceftriaxone  started 1/10 - Albumin  50 g every 8 hours - Recommend repeat paracentesis Tuesday to assess response  Upper GI bleed Symptoms began evening of 1/10 and morning of 1/11 with coffee ground emesis and dark stools.  Patient had Grade II esophageal varices in 2024 but no history of bleeding.  S/p TIPS 12/4, but likely still ongoing portal hypertension - Hgb 8.6 down drom 10.3 yesterday morning.  Last episode of emesis 0730 - Octreotide  started 1/11 - Already on ceftriaxone  - Repeat CBC q6, transfuse for hgb <7 -Will hold Lasix  given soft blood pressures - Upper endoscopy discussed with family today, but hoping that medical therapy will slow down bleeding.  Patient quite frail and fragile, increased risk for complications from sedation - N.p.o. - Will reassess risk/benefits of upper endoscopy tomorrow based on patient's clinical status   Acute kidney injury Creatinine 2.6 on admission from baseline 1.2.  Improved with albumin  to 1.5, but now increasing again in setting of UGIB - Lasix  had been restarted given worsening pleural effusion - Will hold Lasix  today however given bleeding and soft blood pressures   Hepatic hydrothorax, status post TIPS - Chest x-ray 1/9 showed increasing  effusion - Lasix  20 restarted 1/9, stopped today as above -Expect hepatic hydrothorax to continue to improve with lessening diuretic requirement given his newly placed TIPS   Decompensated MASH cirrhosis with refractory ascites/hepatic hydrothorax MELD 22 -25 -Repeat INR to recalculate MELD Overall poor prognosis.  Patient is accumulating complications and cirrhotic decompensations.  I discussed these various complications with the family, and they did not have any additional questions or concerns.  Dr. Charlanne will be taking over inpatient GI services tomorrow  Glendia FORBES Holt, MD  01/21/2024, 1:00 PM [facility]  High complex medical decision making (this includes chart review, review of results, face-to-face time used for counseling as well as treatment plan and follow-up. The patient and his family were provided an opportunity to ask questions and all were answered. The patient and his family agreed with the plan and demonstrated an understanding of the instructions

## 2024-01-22 ENCOUNTER — Inpatient Hospital Stay (HOSPITAL_COMMUNITY)

## 2024-01-22 DIAGNOSIS — K922 Gastrointestinal hemorrhage, unspecified: Secondary | ICD-10-CM | POA: Diagnosis not present

## 2024-01-22 DIAGNOSIS — K7681 Hepatopulmonary syndrome: Secondary | ICD-10-CM | POA: Diagnosis not present

## 2024-01-22 DIAGNOSIS — K7581 Nonalcoholic steatohepatitis (NASH): Secondary | ICD-10-CM | POA: Diagnosis not present

## 2024-01-22 DIAGNOSIS — K7682 Hepatic encephalopathy: Secondary | ICD-10-CM | POA: Diagnosis not present

## 2024-01-22 DIAGNOSIS — N179 Acute kidney failure, unspecified: Secondary | ICD-10-CM | POA: Diagnosis not present

## 2024-01-22 DIAGNOSIS — R188 Other ascites: Secondary | ICD-10-CM | POA: Diagnosis not present

## 2024-01-22 DIAGNOSIS — K7469 Other cirrhosis of liver: Secondary | ICD-10-CM | POA: Diagnosis not present

## 2024-01-22 DIAGNOSIS — K652 Spontaneous bacterial peritonitis: Secondary | ICD-10-CM | POA: Diagnosis not present

## 2024-01-22 HISTORY — PX: IR PARACENTESIS: IMG2679

## 2024-01-22 LAB — URINALYSIS, W/ REFLEX TO CULTURE (INFECTION SUSPECTED)
Bilirubin Urine: NEGATIVE
Glucose, UA: NEGATIVE mg/dL
Ketones, ur: NEGATIVE mg/dL
Leukocytes,Ua: NEGATIVE
Nitrite: NEGATIVE
Protein, ur: NEGATIVE mg/dL
Specific Gravity, Urine: 1.014 (ref 1.005–1.030)
pH: 5 (ref 5.0–8.0)

## 2024-01-22 LAB — MAGNESIUM: Magnesium: 2.4 mg/dL (ref 1.7–2.4)

## 2024-01-22 LAB — CBC WITH DIFFERENTIAL/PLATELET
Abs Immature Granulocytes: 0.24 K/uL — ABNORMAL HIGH (ref 0.00–0.07)
Basophils Absolute: 0 K/uL (ref 0.0–0.1)
Basophils Relative: 0 %
Eosinophils Absolute: 0.1 K/uL (ref 0.0–0.5)
Eosinophils Relative: 1 %
HCT: 25.4 % — ABNORMAL LOW (ref 39.0–52.0)
Hemoglobin: 8.8 g/dL — ABNORMAL LOW (ref 13.0–17.0)
Immature Granulocytes: 2 %
Lymphocytes Relative: 4 %
Lymphs Abs: 0.5 K/uL — ABNORMAL LOW (ref 0.7–4.0)
MCH: 36.5 pg — ABNORMAL HIGH (ref 26.0–34.0)
MCHC: 34.6 g/dL (ref 30.0–36.0)
MCV: 105.4 fL — ABNORMAL HIGH (ref 80.0–100.0)
Monocytes Absolute: 0.7 K/uL (ref 0.1–1.0)
Monocytes Relative: 6 %
Neutro Abs: 9.8 K/uL — ABNORMAL HIGH (ref 1.7–7.7)
Neutrophils Relative %: 87 %
Platelets: 44 K/uL — ABNORMAL LOW (ref 150–400)
RBC: 2.41 MIL/uL — ABNORMAL LOW (ref 4.22–5.81)
RDW: 16 % — ABNORMAL HIGH (ref 11.5–15.5)
WBC: 11.3 K/uL — ABNORMAL HIGH (ref 4.0–10.5)
nRBC: 0.2 % (ref 0.0–0.2)

## 2024-01-22 LAB — GRAM STAIN

## 2024-01-22 LAB — BODY FLUID CELL COUNT WITH DIFFERENTIAL
Eos, Fluid: 0 %
Lymphs, Fluid: 20 %
Monocyte-Macrophage-Serous Fluid: 14 % — ABNORMAL LOW (ref 50–90)
Neutrophil Count, Fluid: 66 % — ABNORMAL HIGH (ref 0–25)
Total Nucleated Cell Count, Fluid: 945 uL (ref 0–1000)

## 2024-01-22 LAB — COMPREHENSIVE METABOLIC PANEL WITH GFR
ALT: 11 U/L (ref 0–44)
AST: 18 U/L (ref 15–41)
Albumin: 3.7 g/dL (ref 3.5–5.0)
Alkaline Phosphatase: 58 U/L (ref 38–126)
Anion gap: 12 (ref 5–15)
BUN: 75 mg/dL — ABNORMAL HIGH (ref 8–23)
CO2: 24 mmol/L (ref 22–32)
Calcium: 8.5 mg/dL — ABNORMAL LOW (ref 8.9–10.3)
Chloride: 109 mmol/L (ref 98–111)
Creatinine, Ser: 1.9 mg/dL — ABNORMAL HIGH (ref 0.61–1.24)
GFR, Estimated: 37 mL/min — ABNORMAL LOW
Glucose, Bld: 159 mg/dL — ABNORMAL HIGH (ref 70–99)
Potassium: 3.9 mmol/L (ref 3.5–5.1)
Sodium: 146 mmol/L — ABNORMAL HIGH (ref 135–145)
Total Bilirubin: 3 mg/dL — ABNORMAL HIGH (ref 0.0–1.2)
Total Protein: 5.9 g/dL — ABNORMAL LOW (ref 6.5–8.1)

## 2024-01-22 LAB — PROTIME-INR
INR: 2.8 — ABNORMAL HIGH (ref 0.8–1.2)
Prothrombin Time: 30.5 s — ABNORMAL HIGH (ref 11.4–15.2)

## 2024-01-22 LAB — PATHOLOGIST SMEAR REVIEW

## 2024-01-22 MED ORDER — LIDOCAINE-EPINEPHRINE 1 %-1:100000 IJ SOLN
20.0000 mL | Freq: Once | INTRAMUSCULAR | Status: AC
  Administered 2024-01-22: 10 mL

## 2024-01-22 MED ORDER — MIDODRINE HCL 5 MG PO TABS: 5.0000 mg | ORAL_TABLET | Freq: Three times a day (TID) | ORAL | Status: DC

## 2024-01-22 MED ORDER — ADULT MULTIVITAMIN W/MINERALS CH
1.0000 | ORAL_TABLET | Freq: Every day | ORAL | Status: DC
  Administered 2024-01-22 – 2024-01-25 (×4): 1 via ORAL
  Filled 2024-01-22 (×4): qty 1

## 2024-01-22 MED ORDER — CARVEDILOL 3.125 MG PO TABS
3.1250 mg | ORAL_TABLET | Freq: Two times a day (BID) | ORAL | Status: DC
  Administered 2024-01-22: 3.125 mg via ORAL
  Filled 2024-01-22: qty 1

## 2024-01-22 MED ORDER — LIDOCAINE HCL (PF) 1 % IJ SOLN
30.0000 mL | Freq: Once | INTRAMUSCULAR | Status: DC
  Filled 2024-01-22: qty 30

## 2024-01-22 MED ORDER — ENSURE PLUS HIGH PROTEIN PO LIQD
237.0000 mL | Freq: Three times a day (TID) | ORAL | Status: DC
  Administered 2024-01-23 – 2024-01-25 (×5): 237 mL via ORAL

## 2024-01-22 MED ORDER — HYDRALAZINE HCL 20 MG/ML IJ SOLN: 10.0000 mg | Freq: Four times a day (QID) | INTRAMUSCULAR | Status: DC | PRN

## 2024-01-22 MED ORDER — LACTULOSE 10 GM/15ML PO SOLN
10.0000 g | Freq: Two times a day (BID) | ORAL | Status: DC
  Administered 2024-01-22 – 2024-01-23 (×4): 10 g via ORAL
  Filled 2024-01-22 (×4): qty 30

## 2024-01-22 MED ORDER — DEXTROSE 5 % IV SOLN: INTRAVENOUS | Status: AC

## 2024-01-22 MED ORDER — VITAMIN K1 10 MG/ML IJ SOLN
10.0000 mg | Freq: Once | INTRAVENOUS | Status: AC
  Administered 2024-01-22: 10 mg via INTRAVENOUS
  Filled 2024-01-22: qty 1

## 2024-01-22 NOTE — Progress Notes (Signed)
 Occupational Therapy Treatment Patient Details Name: David Esparza MRN: [id number] DOB: 1950-12-29 Today's Date: 01/22/2024   History of present illness Pt is a 74 y/o M who presented to [facility] ED 01/16/24 for AMS x2-3 days. Wife notes pt has been confused recently and non-compliant with his medications. Had a TIPS procedure on 12/4 along with right thoracentesis and left gastric vein embolization.  Patient had a pulmonology appointment on 12/19 where patient was dealing with some fluid overload post-procedure. PMHx: DM, HLD, liver cirrhosis secondary to NASH.   OT comments  Patient with fair progress toward patient focused goals.  Needing a little more assist with lower body ADL than initial eval, fecal management tube creating discomfort and leaking.  Min A for bed mobility and CGA for stand pivot transfer.  HH OT will continue to be recommended, but SNF may be needed if he continues to weaken.        If plan is discharge home, recommend the following:  A little help with walking and/or transfers;Assistance with cooking/housework;Direct supervision/assist for medications management;Direct supervision/assist for financial management;Assist for transportation;Help with stairs or ramp for entrance;Supervision due to cognitive status;A lot of help with bathing/dressing/bathroom   Equipment Recommendations  BSC/3in1    Recommendations for Other Services      Precautions / Restrictions Precautions Precautions: Fall Precaution/Restrictions Comments: fecal tube Restrictions Weight Bearing Restrictions Per Provider Order: No       Mobility Bed Mobility Overal bed mobility: Needs Assistance Bed Mobility: Supine to Sit     Supine to sit: HOB elevated, Min assist          Transfers Overall transfer level: Needs assistance Equipment used: Rolling walker (2 wheels) Transfers: Sit to/from Stand, Bed to chair/wheelchair/BSC Sit to Stand: Contact guard assist     Step pivot transfers:  Contact guard assist           Balance Overall balance assessment: Needs assistance Sitting-balance support: No upper extremity supported, Feet supported Sitting balance-Leahy Scale: Fair     Standing balance support: Reliant on assistive device for balance Standing balance-Leahy Scale: Poor                             ADL either performed or assessed with clinical judgement   ADL   Eating/Feeding: NPO (sips with meds and ice chips)   Grooming: Wash/dry hands;Wash/dry face;Set up;Sitting                   Toilet Transfer: Contact guard assist;Stand-pivot;Rolling walker (2 wheels)   Toileting- Clothing Manipulation and Hygiene: Total assistance;Sit to/from stand              Extremity/Trunk Assessment Upper Extremity Assessment Upper Extremity Assessment: Overall WFL for tasks assessed;Right hand dominant;RUE deficits/detail RUE Deficits / Details: Increased edema RUE Sensation: decreased light touch   Lower Extremity Assessment Lower Extremity Assessment: Defer to PT evaluation   Cervical / Trunk Assessment Cervical / Trunk Assessment: Normal    Vision Patient Visual Report: No change from baseline     Perception Perception Perception: Not tested   Praxis Praxis Praxis: Not tested   Communication Communication Communication: No apparent difficulties   Cognition Arousal: Alert Behavior During Therapy: WFL for tasks assessed/performed Cognition: No family/caregiver present to determine baseline             OT - Cognition Comments: following commands with cues as needed.  Alert to place, person and 2026.  Following commands: Intact Following commands impaired: Follows one step commands with increased time      Cueing   Cueing Techniques: Verbal cues                     Pertinent Vitals/ Pain       Pain Assessment Pain Assessment: No/denies pain                                                           Frequency  Min 2X/week        Progress Toward Goals  OT Goals(current goals can now be found in the care plan section)  Progress towards OT goals: Not progressing toward goals - comment;Progressing toward goals  Acute Rehab OT Goals OT Goal Formulation: With patient Time For Goal Achievement: 02/01/24 Potential to Achieve Goals: Fair  Plan      Co-evaluation                 AM-PAC OT 6 Clicks Daily Activity     Outcome Measure   Help from another person eating meals?: Total Help from another person taking care of personal grooming?: A Little Help from another person toileting, which includes using toliet, bedpan, or urinal?: A Lot Help from another person bathing (including washing, rinsing, drying)?: A Lot Help from another person to put on and taking off regular upper body clothing?: A Little Help from another person to put on and taking off regular lower body clothing?: A Lot 6 Click Score: 13    End of Session Equipment Utilized During Treatment: Rolling walker (2 wheels)  OT Visit Diagnosis: Unsteadiness on feet (R26.81);Repeated falls (R29.6);Other symptoms and signs involving cognitive function   Activity Tolerance Patient tolerated treatment well   Patient Left in chair;with call bell/phone within reach;Other (comment) (No chair alarm box)   Nurse Communication          Time: [id number] OT Time Calculation (min): 24 min  Charges: OT General Charges $OT Visit: 1 Visit OT Treatments $Self Care/Home Management : 8-22 mins $Therapeutic Activity: 8-22 mins  01/22/2024  RP, OTR/L  Acute Rehabilitation Services  Office:  [phone]    David Esparza 01/22/2024, 10:16 AM

## 2024-01-22 NOTE — Discharge Instructions (Addendum)
 Cirrhosis Nutrition Therapy  Cirrhosis is the result of scarring in your liver. The liver is important because it converts the food you eat into energy and helps to filter out waste products. Symptoms of liver disease can include decreased appetite, feeling full quickly, and unwanted weight loss. Nutrition and physical activity will both be important in helping you maintain your muscle mass and strength. Eating a diet that is high in calories and protein will help prevent loss of muscle and strength. Your body can become undernourished quickly when you have cirrhosis, so it is very important to avoid going long periods of time without eating.  Tips The most important goal is to meet your calorie and protein needs. You should aim to eat __________ calories each day. You should aim to eat __________ grams of protein each day. Eat 4-6 times per day or every 1-2 hours. Always include a snack before bedtime and eat in the morning right away when you wake up. Add protein to your diet. Some strategies: Eat protein from a variety of sources with each meal or snack, such as dairy, eggs, beans/legumes, nuts/nut-butters, soy products, and meat/poultry/fish. Choose dairy or fortified soy products to help you meet your protein needs. Eat high-calorie/high-protein supplements like protein bars or shakes. Try to include foods that will increase your fiber intake. Aim to eat at least 5 servings of vegetables each day. Choose whole grains (such as wheat bread, quinoa, oats, brown rice) more often. Limit your salt intake to less than 2000 milligrams per day if you are retaining fluid in your arms, legs, feet, and/or stomach.  Restricting salt intake is less of a concern if you arent getting enough calories and protein. Your registered dietitian nutritionist (RDN) will provide you with information on how to lower your salt intake if needed.  Stay as active as possible to prevent loss of muscle and strength. Go for  a walk or participate in exercises your health care provider has recommended. Foodborne illnesses may cause severe infections in people with cirrhosis. Use the Food Safety Nutrition Therapy for help with safe food handling. Foods to Choose Food Group Foods to Choose  Grains Bread, tortillas, pasta, chapati/roti Quinoa, barley, farro Rice Breakfast cereals such as cold cereals, oatmeal and grits  Protein Foods Beans and lentils Unsalted nuts or nut-butters Eggs Chicken or turkey Pork Fish Beef Soy products such as tofu   Dairy and Dairy Alternatives Whole milk Fortified soymilk Whole-milk yogurt, Greek yogurt   Sour cream  Vegetables All fresh or frozen vegetables Drained and rinsed canned vegetables  Fruit All fresh, frozen, dried or canned fruits  Fats and Oils All oils Margarine Butter   Cirrhosis Sample 1-Day Menu View Nutrient Info Breakfast 2 eggs scrambled with:  cup mushrooms  cup bell pepper 1 slice whole wheat bread with: 1 tablespoon jam 1 tablespoon margarine, soft, tub 1 cup coffee 1 tablespoon half and half, for coffee  Morning Snack 2 tablespoons peanut butter, without salt 1 apple 1 cup whole milk  Lunch Bean burrito made with: 12-inch flour tortilla  cup refried beans, low-sodium 2 tablespoons sour cream 2 tablespoons salsa  cup shredded lettuce  Afternoon Snack  cup Greek yogurt  Evening Meal 3 ounce chicken breast, cooked 1 cup green beans, cooked 1 cup brown rice, cooked 2 teaspoons margarine, soft, tub  Evening Snack High calorie high protein shake made with:  banana 1 scoop whey or plant-based protein powder  cup Greek yogurt 1 cup whole milk  Daily  Sum Nutrient Unit Value  Macronutrients  Energy kcal 2271  Energy kJ 9494  Protein g 134  Total lipid (fat) g 97  Carbohydrate, by difference g 227  Fiber, total dietary g 27  Sugars, total g 87  Minerals  Calcium , Ca mg 1496  Iron, Fe mg 11  Sodium, Na mg 1893  Vitamins   Vitamin C, total ascorbic acid mg 65  Vitamin A, IU IU 6695  Vitamin D  IU 338  Lipids  Fatty acids, total saturated g 33  Fatty acids, total monounsaturated g 35  Fatty acids, total polyunsaturated g 21  Cholesterol mg 537      Low Sodium Nutrition Therapy  Eating less sodium can help you if you have high blood pressure, heart failure, or kidney or liver disease.   Your body needs a little sodium, but too much sodium can cause your body to hold onto extra water. This extra water will raise your blood pressure and can cause damage to your heart, kidneys, or liver as they are forced to work harder.   Sometimes you can see how the extra fluid affects you because your hands, legs, or belly swell. You may also hold water around your heart and lungs, which makes it hard to breathe.   Even if you take medication for blood pressure or a water pill (diuretic) to remove fluid, it is still important to have less salt in your diet.   Check with your primary care provider before drinking alcohol since it may affect the amount of fluid in your body and how your heart, kidneys, or liver work.  Sodium in Food A low-sodium meal plan limits the sodium that you get from food and beverages to 1,500-2,000 milligrams (mg) per day. Salt is the main source of sodium. Read the nutrition label on the package to find out how much sodium is in one serving of a food.  Select foods with 140 milligrams (mg) of sodium or less per serving.  You may be able to eat one or two servings of foods with a little more than 140 milligrams (mg) of sodium if you are closely watching how much sodium you eat in a day.  Check the serving size on the label. The amount of sodium listed on the label shows the amount in one serving of the food. So, if you eat more than one serving, you will get more sodium than the amount listed.  Tips Cutting Back on Sodium Eat more fresh foods.  Fresh fruits and vegetables are low in sodium, as  well as frozen vegetables and fruits that have no added juices or sauces.  Fresh meats are lower in sodium than processed meats, such as bacon, sausage, and hotdogs.  Not all processed foods are unhealthy, but some processed foods may have too much sodium.  Eat less salt at the table and when cooking. One of the ingredients in salt is sodium.  One teaspoon of table salt has 2,300 milligrams of sodium.  Leave the salt out of recipes for pasta, casseroles, and soups. Be a engineer, building services.  Food packages that say Salt-free, sodium-free, very low sodium, and low sodium have less than 140 milligrams of sodium per serving.  Beware of products identified as Unsalted, No Salt Added, Reduced Sodium, or Lower Sodium. These items may still be high in sodium. You should always check the nutrition label. Add flavors to your food without adding sodium.  Try lemon juice, lime juice, or vinegar.  Dry or  fresh herbs add flavor.  Buy a sodium-free seasoning blend or make your own at home. You can purchase salt-free or sodium-free condiments like barbeque sauce in stores and online. Ask your registered dietitian nutritionist for recommendations and where to find them.   Eating in Restaurants Choose foods carefully when you eat outside your home. Restaurant foods can be very high in sodium. Many restaurants provide nutrition facts on their menus or their websites. If you cannot find that information, ask your server. Let your server know that you want your food to be cooked without salt and that you would like your salad dressing and sauces to be served on the side.    Foods Recommended Food Group Foods Recommended  Grains Bread, bagels, rolls without salted tops Homemade bread made with reduced-sodium baking powder Cold cereals, especially shredded wheat and puffed rice Oats, grits, or cream of wheat Pastas, quinoa, and rice Popcorn, pretzels or crackers without salt Corn tortillas  Protein  Foods Fresh meats and fish; turkey bacon (check the nutrition labels - make sure they are not packaged in a sodium solution) Canned or packed tuna (no more than 4 ounces at 1 serving) Beans and peas Soybeans) and tofu Eggs Nuts or nut butters without salt  Dairy Milk or milk powder Plant milks, such as rice and soy Yogurt, including Greek yogurt Small amounts of natural cheese (blocks of cheese) or reduced-sodium cheese can be used in moderation. (Swiss, ricotta, and fresh mozzarella cheese are lower in sodium than the others) Cream Cheese Low sodium cottage cheese  Vegetables Fresh and frozen vegetables without added sauces or salt Homemade soups (without salt) Low-sodium, salt-free or sodium-free canned vegetables and soups  Fruit Fresh and canned fruits Dried fruits, such as raisins, cranberries, and prunes  Oils Tub or liquid margarine, regular or without salt Canola, corn, peanut, olive, safflower, or sunflower oils  Condiments Fresh or dried herbs such as basil, bay leaf, dill, mustard (dry), nutmeg, paprika, parsley, rosemary, sage, or thyme.  Low sodium ketchup Vinegar  Lemon or lime juice Pepper, red pepper flakes, and cayenne. Hot sauce contains sodium, but if you use just a drop or two, it will not add up to much.  Salt-free or sodium-free seasoning mixes and marinades Simple salad dressings: vinegar and oil   Foods Not Recommended Food Group Foods Not Recommended  Grains Breads or crackers topped with salt Cereals (hot/cold) with more than 300 mg sodium per serving Biscuits, cornbread, and other quick breads prepared with baking soda Pre-packaged bread crumbs Seasoned and packaged rice and pasta mixes Self-rising flours  Protein Foods Cured meats: Bacon, ham, sausage, pepperoni and hot dogs Canned meats (chili, vienna sausage, or sardines) Smoked fish and meats Frozen meals that have more than 600 mg of sodium per serving Egg substitute (with added sodium)   Dairy Buttermilk Processed cheese spreads Cottage cheese (1 cup may have over 500 mg of sodium; look for low-sodium.) American or feta cheese Shredded Cheese has more sodium than blocks of cheese String cheese  Vegetables Canned vegetables (unless they are salt-free, sodium-free or low sodium) Frozen vegetables with seasoning and sauces Sauerkraut and pickled vegetables Canned or dried soups (unless they are salt-free, sodium-free, or low sodium) French fries and onion rings  Fruit Dried fruits preserved with additives that have sodium  Oils Salted butter or margarine, all types of olives  Condiments Salt, sea salt, kosher salt, onion salt, and garlic salt Seasoning mixes with salt Bouillon cubes Ketchup Barbeque sauce and Worcestershire  sauce unless low sodium Soy sauce Salsa, pickles, olives, relish Salad dressings: ranch, blue cheese, Italian, and French.   Low Sodium Sample 1-Day Menu  Breakfast 1 cup cooked oatmeal  1 slice whole wheat bread toast  1 tablespoon peanut butter without salt  1 banana  1 cup 1% milk  Lunch Tacos made with: 2 corn tortillas   cup black beans, low sodium   cup roasted or grilled chicken (without skin)   avocado  Squeeze of lime juice  1 cup salad greens  1 tablespoon low-sodium salad dressing   cup strawberries  1 orange  Afternoon Snack 1/3 cup grapes  6 ounces yogurt  Evening Meal 3 ounces herb-baked fish  1 baked potato  2 teaspoons olive oil   cup cooked carrots  2 thick slices tomatoes on:  2 lettuce leaves  1 teaspoon olive oil  1 teaspoon balsamic vinegar  1 cup 1% milk  Evening Snack 1 apple   cup almonds without salt  Copyright 2020  [employer]. All rights reserved   Sodium Free Flavoring Tips  When cooking, the following items may be used for flavoring instead of salt or seasonings that contain sodium. Remember: A little bit of spice goes a long way! Be careful not to overseason. Spice  Blend Recipe (makes about ? cup) 5 teaspoons onion powder  2 teaspoons garlic powder  2 teaspoons paprika  2 teaspoon dry mustard  1 teaspoon crushed thyme leaves   teaspoon white pepper   teaspoon celery seed Food Item Flavorings  Beef Basil, bay leaf, caraway, curry, dill, dry mustard, garlic, grape jelly, green pepper, mace, marjoram, mushrooms (fresh), nutmeg, onion or onion powder, parsley, pepper, rosemary, sage  Chicken Basil, cloves, cranberries, mace, mushrooms (fresh), nutmeg, oregano, paprika, parsley, pineapple, saffron, sage, savory, tarragon, thyme, tomato, turmeric  Egg Chervil, curry, dill, dry mustard, garlic or garlic powder, green pepper, jelly, mushrooms (fresh), nutmeg, onion powder, paprika, parsley, rosemary, tarragon, tomato  Fish Basil, bay leaf, chervil, curry, dill, dry mustard, green pepper, lemon juice, marjoram, mushrooms (fresh), paprika, pepper, tarragon, tomato, turmeric  Lamb Cloves, curry, dill, garlic or garlic powder, mace, mint, mint jelly, onion, oregano, parsley, pineapple, rosemary, tarragon, thyme  Pork Applesauce, basil, caraway, chives, cloves, garlic or garlic powder, onion or onion powder, rosemary, thyme  Veal Apricots, basil, bay leaf, currant jelly, curry, ginger, marjoram, mushrooms (fresh), oregano, paprika  Vegetables Basil, dill, garlic or garlic powder, ginger, lemon juice, mace, marjoram, nutmeg, onion or onion powder, tarragon, tomato, sugar or sugar substitute, salt-free salad dressing, vinegar  Desserts Allspice, anise, cinnamon, cloves, ginger, mace, nutmeg, vanilla extract, other extracts   Copyright 2020  [employer]. All rights reserved  Fluid Restricted Nutrition Therapy  You have been prescribed this diet because your condition affects how much fluid you can eat or drink. If your heart, liver, or kidneys aren't working properly, you may not be able to effectively eliminate fluids from the body and this  may cause swelling (edema) in the legs, arms, and/or stomach. Drink no more than _________ liters or ________ ounces or ________cups of fluid per day.  You don't need to stop eating or drinking the same fluids you normally would, but you may need to eat or drink less than usual.  Your registered dietitian nutritionist will help you determine the correct amount of fluid to consume during the day Breakfast Include fluids taken with medications  Lunch Include fluids taken with medications  Dinner  Include fluids taken with medications  Bedtime Snack Include fluids taken with medications     Tips What Are Fluids?  A fluid is anything that is liquid or anything that would melt if left at room temperature. You will need to count these foods and liquids--including any liquid used to take medication--as part of your daily fluid intake. Some examples are: Alcohol (drink only with your doctor's permission)  Coffee, tea, and other hot beverages  Gelatin (Jell-O)  Gravy  Ice cream, sherbet, sorbet  Ice cubes, ice chips  Milk, liquid creamer  Nutritional supplements  Popsicles  Vegetable and fruit juices; fluid in canned fruit  Watermelon  Yogurt  Soft drinks, lemonade, limeade  Soups  Syrup How Do I Measure My Fluid Intake? Record your fluid intake daily.  Tip: Every day, each time you eat or drink fluids, pour water in the same amount into an empty container that can hold the same amount of fluids you are allowed daily. This may help you keep track of how much fluid you are taking in throughout the day.  To accurately keep track of how much liquid you take in, measure the size of the cups, glasses, and bowls you use. If you eat soup, measure how much of it is liquid and how much is solid (such as noodles, vegetables, meat). Conversions for Measuring Fluid Intake  Milliliters (mL) Liters (L) Ounces (oz) Cups (c)  1000 1 32 4  1200 1.2 40 5  1500 1.5 50 6 1/4  1800 1.8 60 7 1/2  2000 2 67 8  1/3   Tips to Reduce Your Thirst Chew gum or suck on hard candy.  Rinse or gargle with mouthwash. Do not swallow.  Ice chips or popsicles my help quench thirst, but this too needs to be calculated into the total restriction. Melt ice chips or cubes first to figure out how much fluid they produce (for example, experiment with melting  cup ice chips or 2 ice cubes).  Add a lemon wedge to your water.  Limit how much salt you take in. A high salt intake might make you thirstier.  Don't eat or drink all your allowed liquids at once. Space your liquids out through the day.  Use small glasses and cups and sip slowly. If allowed, take your medications with fluids you eat or drink during a meal.

## 2024-01-22 NOTE — Progress Notes (Signed)
 SLP Cancellation Note  Patient Details Name: David Esparza MRN: [id number] DOB: 09/23/1950   Cancelled treatment:        Continue NPO per GI note and will hold off until GI clears. Attending states to start on clears after eval if GI clears. Will follow    [name] 01/22/2024, 10:37 AM

## 2024-01-22 NOTE — Consult Note (Signed)
 David Esparza Admit Date: 01/16/2024 01/22/2024 David Esparza Requesting Physician:  Dennise MD  Reason for Consult:  AKI HPI:  74M PMH including MASH cirrhosis with refractory ascites, history of esophageal varices, and a history of TIPS completed 12/14/2023, hypertension presented with confusion and treated for hepatic encephalopathy encephalopathy currently being treated with lactulose  and rifaximin  who has had an AKI during his admission.  He has baseline normal GFR as recently as December of this year.  Presenting creatinine was 2.6 and has slowly improved with supportive measures over the course of his admission but over the past 2 days has increased back up to 1.9 today.  He is having significant stool output with treatment available encephalopathy.  Additional course has been complicated by likely GI bleed 01/20/2024.  Gastroenterology is following closely.  Currently receiving octreotide .  Has a also been receiving midodrine  and albumin .  Blood pressures have normalized.  Most recent albumin  level of 3.7.  Additional labs today with sodium 146, BUN 75, potassium 3.9.  Renal ultrasound on 01/18/2024 had a normal ultrasonographic appearance of the kidneys.  No NSAID or contrast exposure.  Urine sodium on 01/16/2024 was less than 30.  Urine output has been reasonable between 1 and 1.5 L today, yesterday was not recorded.   Creatinine (mg/dL)  Date Value  95/70/7975 0.96  03/29/2022 1.10   Creat (mg/dL)  Date Value  90/97/7985 1.12   Creatinine, Ser (mg/dL)  Date Value  98/87/7973 1.90 (H)  01/21/2024 1.71 (H)  01/20/2024 1.53 (H)  01/19/2024 1.66 (H)  01/18/2024 1.93 (H)  01/17/2024 2.09 (H)  01/17/2024 2.35 (H)  01/16/2024 2.61 (H)  12/29/2023 1.28 (H)  12/16/2023 1.00  ] I/Os: I/O last 3 completed shifts: In: -  Out: 1200 [Urine:500; Emesis/NG output:100; Stool:600]   ROS NSAIDS: No exposure IV Contrast exposure TMP/SMX no exposure Balance of 12 systems is negative w/  exceptions as above  PMH  Past Medical History:  Diagnosis Date   Abnormal LFTs 09/06/2011   Acute bronchitis 08/16/2010   Anxiety 09/06/2011   Chicken pox as a child   Cirrhosis (HCC)    Cutaneous skin tags 07/27/2016   Diabetes mellitus without complication (HCC)    pt denies   Fatigue    Gout    Gout    Grief reaction 09/09/2012   Hyperglycemia 09/06/2011   Hyperlipidemia 09/06/2011   Hypertension    Insomnia 08/16/2010   Knee pain, acute 02/28/2012   Liver cirrhosis secondary to NASH (HCC)    Low testosterone  02/28/2012   Measles as a child   Mumps as a child   Nocturia 08/16/2010   Obese    Preventative health care 09/06/2011   Sleep apnea 09/06/2011   Unspecified vitamin D  deficiency 02/28/2012   PSH  Past Surgical History:  Procedure Laterality Date   BACK SURGERY     COLONOSCOPY  09/16/2010   Dr. Debrah: moderate diverticulosis sigmoid and descending colon, o/w normal: repeat 10 yrs.   intestines sewn  74 yrs old   shot once made 6 holes   IR ANGIOGRAM SELECTIVE EACH ADDITIONAL VESSEL  12/14/2023   IR EMBO ARTERIAL NOT HEMORR HEMANG INC GUIDE ROADMAPPING  12/14/2023   IR PARACENTESIS  12/22/2023   IR PARACENTESIS  01/22/2024   IR RADIOLOGIST EVAL & MGMT  11/15/2023   IR THORACENTESIS RIGHT ASP PLEURAL SPACE W/IMG GUIDE  12/04/2023   IR THORACENTESIS RIGHT ASP PLEURAL SPACE W/IMG GUIDE  12/14/2023   IR TIPS  12/14/2023  IR US  GUIDE VASC ACCESS RIGHT  12/14/2023   IR US  GUIDE VASC ACCESS RIGHT  12/14/2023   TIPS PROCEDURE N/A 12/14/2023   Procedure: INSERTION, SHUNT, INTRAHEPATIC PORTOSYSTEMIC, TRANSJUGULAR;  Surgeon: Jennefer Ester PARAS, MD;  Location: [facility] OR;  Service: Radiology;  Laterality: N/A;   TONSILLECTOMY  as a child   TONSILLECTOMY     UMBILICAL HERNIA REPAIR N/A 01/30/2023   Procedure: OPEN UMBILICAL HERNIA REPAIR;  Surgeon: Belinda Cough, MD;  Location: [facility] OR;  Service: General;  Laterality: N/A;   VASECTOMY  74 yrs old   FH  Family History  Problem  Relation Age of Onset   Diabetes Mother        type 2   Esophageal cancer Maternal Grandmother    Cancer Maternal Grandmother        throat   Heart disease Maternal Grandfather    Diabetes Maternal Grandfather    Depression Daughter        anxiety   Graves' disease Daughter    Colon polyps Neg Hx    Colon cancer Neg Hx    Rectal cancer Neg Hx    Stomach cancer Neg Hx    SH  reports that he quit smoking about 55 years ago. His smoking use included cigarettes. He has never used smokeless tobacco. He reports that he does not currently use alcohol. He reports that he does not use drugs. Allergies Allergies[1] Home medications Prior to Admission medications  Medication Sig Start Date End Date Taking? Authorizing Provider  albuterol  (VENTOLIN  HFA) 108 (90 Base) MCG/ACT inhaler Inhale 2 puffs into the lungs every 6 (six) hours as needed for wheezing or shortness of breath. 08/22/23  Yes Frann Mabel Mt, DO  carvedilol  (COREG ) 12.5 MG tablet Take 12.5 mg by mouth 2 (two) times daily with a meal.   Yes [provider]  furosemide  (LASIX ) 40 MG tablet Take 1 tablet (40 mg total) by mouth daily. Patient taking differently: Take 40-80 mg by mouth See admin instructions. Take 80 mg in the morning by mouth and 40 mg in the afternoon by mouth 12/17/23  Yes Gonfa, Mignon DASEN, MD  lactulose  (CHRONULAC ) 10 GM/15ML solution Take 20 gm (30 ml) two to three times a day for a goal of 2-3 bowel movements a day Patient taking differently: Take 10 g by mouth daily. Take 20 gm (30 ml) two to three times a day for a goal of 2-3 bowel movements a day 12/16/23  Yes Gonfa, Taye T, MD  lisinopril  (ZESTRIL ) 2.5 MG tablet Take 2.5 mg by mouth daily.   Yes [provider]  spironolactone  (ALDACTONE ) 50 MG tablet Take 1 tablet (50 mg total) by mouth daily. 12/16/23 12/15/24 Yes Gonfa, Taye T, MD    Current Medications Scheduled Meds:  carvedilol   3.125 mg Oral BID WC   feeding supplement  237 mL Oral  BID BM   lactulose   10 g Oral BID   lidocaine  (PF)  10 mL Intradermal Once   lidocaine  (PF)  30 mL Intradermal Once   pantoprazole  (PROTONIX ) IV  40 mg Intravenous Q12H   rifaximin   550 mg Oral BID   Continuous Infusions:  cefTRIAXone  (ROCEPHIN )  IV 2 g (01/21/24 1606)   dextrose  100 mL/hr at 01/22/24 1212   octreotide  (SANDOSTATIN ) 500 mcg in sodium chloride  0.9 % 250 mL (2 mcg/mL) infusion 50 mcg/hr (01/22/24 1158)   phytonadione  (VITAMIN K ) 10 mg in dextrose  5 % 50 mL IVPB 10 mg (01/22/24 1301)  PRN Meds:.albuterol , hydrALAZINE , ondansetron  (ZOFRAN ) IV  CBC Recent Labs  Lab 01/21/24 1155 01/21/24 2117 01/22/24 1232  WBC 9.0 10.9* 11.3*  NEUTROABS 8.1* 9.5* 9.8*  HGB 8.6* 8.3* 8.8*  HCT 25.0* 24.4* 25.4*  MCV 105.9* 105.2* 105.4*  PLT 38* 47* 44*   Basic Metabolic Panel Recent Labs  Lab 01/17/24 0202 01/17/24 0940 01/18/24 0358 01/19/24 0309 01/20/24 0446 01/21/24 0348 01/22/24 0740  NA 137 136 139 140 139 145 146*  K 4.0 4.0 4.0 4.1 4.6 4.6 3.9  CL 102 103 105 106 104 109 109  CO2 22 21* 23 24 26 23 24   GLUCOSE 128* 130* 123* 132* 121* 148* 159*  BUN 65* 62* 54* 45* 46* 57* 75*  CREATININE 2.35* 2.09* 1.93* 1.66* 1.53* 1.71* 1.90*  CALCIUM  8.5* 8.2* 8.3* 8.3* 8.4* 8.5* 8.5*    Physical Exam  Blood pressure 132/87, pulse 80, temperature (!) 97.5 F (36.4 C), temperature source Axillary, resp. rate 20, height 5' 7 (1.702 m), weight 105 kg, SpO2 93%. GEN: Elderly male, mildly confused ENT: NCAT EYES: EOMI CV: Regular, no rub PULM: Diminished in the bases ABD: Protuberant, soft SKIN: No petechia or purpura EXT: No significant edema   Assessment 18M with advanced cirrhosis including large volume ascites status post TIPS 12/2023, recurrent encephalopathy, history of varices who has a stuttering AKI.  AKI with normal baseline: Likely multifactorial and included presentation with component of HRS.  Has improved with supportive care and now worsening again  potentially because of changes in volume status.    Imaging has been reassuring.  Would not resume any diuretics at the current time.  Urinalysis today without hematuria, pyuria, proteinuria.  Blood pressures are stable.I think at this point continue supportive care. Decompensated cirrhosis presenting with encephalopathy, having GI bleed: Gastroenterology is following.  On octreotide .  Previously requiring midodrine . Upper GI bleed on octreotide  with GI following  Plan As above. Has a poor prognosis with progressive complications noted. Daily weights, Daily Renal Panel, Strict I/Os, Avoid nephrotoxins (NSAIDs, judicious IV Contrast)  Will follow along closely    David Esparza  [phone] pgr 01/22/2024, 1:27 PM        [1] No Known Allergies

## 2024-01-22 NOTE — Evaluation (Signed)
 Clinical/Bedside Swallow Evaluation Patient Details  Name: David Esparza MRN: [id number] Date of Birth: Nov 21, 1950  Today's Date: 01/22/2024 Time: SLP Start Time (ACUTE ONLY): 1554 SLP Stop Time (ACUTE ONLY): 1602 SLP Time Calculation (min) (ACUTE ONLY): 8 min  Past Medical History:  Past Medical History:  Diagnosis Date   Abnormal LFTs 09/06/2011   Acute bronchitis 08/16/2010   Anxiety 09/06/2011   Chicken pox as a child   Cirrhosis (HCC)    Cutaneous skin tags 07/27/2016   Diabetes mellitus without complication (HCC)    pt denies   Fatigue    Gout    Gout    Grief reaction 09/09/2012   Hyperglycemia 09/06/2011   Hyperlipidemia 09/06/2011   Hypertension    Insomnia 08/16/2010   Knee pain, acute 02/28/2012   Liver cirrhosis secondary to NASH (HCC)    Low testosterone  02/28/2012   Measles as a child   Mumps as a child   Nocturia 08/16/2010   Obese    Preventative health care 09/06/2011   Sleep apnea 09/06/2011   Unspecified vitamin D  deficiency 02/28/2012   Past Surgical History:  Past Surgical History:  Procedure Laterality Date   BACK SURGERY     COLONOSCOPY  09/16/2010   Dr. Debrah: moderate diverticulosis sigmoid and descending colon, o/w normal: repeat 10 yrs.   intestines sewn  74 yrs old   shot once made 6 holes   IR ANGIOGRAM SELECTIVE EACH ADDITIONAL VESSEL  12/14/2023   IR EMBO ARTERIAL NOT HEMORR HEMANG INC GUIDE ROADMAPPING  12/14/2023   IR PARACENTESIS  12/22/2023   IR PARACENTESIS  01/22/2024   IR RADIOLOGIST EVAL & MGMT  11/15/2023   IR THORACENTESIS RIGHT ASP PLEURAL SPACE W/IMG GUIDE  12/04/2023   IR THORACENTESIS RIGHT ASP PLEURAL SPACE W/IMG GUIDE  12/14/2023   IR TIPS  12/14/2023   IR US  GUIDE VASC ACCESS RIGHT  12/14/2023   IR US  GUIDE VASC ACCESS RIGHT  12/14/2023   TIPS PROCEDURE N/A 12/14/2023   Procedure: INSERTION, SHUNT, INTRAHEPATIC PORTOSYSTEMIC, TRANSJUGULAR;  Surgeon: Jennefer Ester PARAS, MD;  Location: [facility] OR;  Service: Radiology;  Laterality:  N/A;   TONSILLECTOMY  as a child   TONSILLECTOMY     UMBILICAL HERNIA REPAIR N/A 01/30/2023   Procedure: OPEN UMBILICAL HERNIA REPAIR;  Surgeon: Belinda Cough, MD;  Location: [facility] OR;  Service: General;  Laterality: N/A;   VASECTOMY  74 yrs old   HPI:  Pt is a 74 y/o M who presented to [facility] ED [facility]/16/24 for AMS x2-3 days. Diagnosed with hepatic encephalopathy and GI bleed.  Had a TIPS procedure on 12/4 along with right thoracentesis and left gastric vein embolization.  1/8 Persistent large right pleural effusion with associated hazy opacity throughout the right lung, likely reflecting compressive atelectasis.  PMHx: DM, HLD, liver cirrhosis secondary to NASH.    Assessment / Plan / Recommendation  Clinical Impression  Pt presents with normal oropharyngeal swallow from clinical assessment without prior complaints. Limited to pudding and thin liquids due to GI recommendation for full liquid diet. No concern for airway intrusion with safety and efficiency appearing intact from observation. Continue full liquids per GI. No further ST services needed. SLP Visit Diagnosis: Dysphagia, unspecified (R13.10)    Aspiration Risk  No limitations    Diet Recommendation           Other Recommendations Oral Care Recommendations: Oral care BID     Swallow Evaluation Recommendations Recommendations: PO diet PO Diet Recommendation: Full liquid diet (per  GI MD) Liquid Administration via: Cup;Straw Medication Administration: Whole meds with liquid Supervision: Patient able to self-feed Postural changes: Position pt fully upright for meals Oral care recommendations: Oral care BID (2x/day)   Assistance Recommended at Discharge    Functional Status Assessment Patient has not had a recent decline in their functional status  Frequency and Duration            Prognosis        Swallow Study   General Date of Onset: 01/22/24 HPI: Pt is a 74 y/o M who presented to [facility] ED 01/16/24 for AMS x2-3 days. Diagnosed  with hepatic encephalopathy and GI bleed.  Had a TIPS procedure on 12/4 along with right thoracentesis and left gastric vein embolization.  1/8 Persistent large right pleural effusion with associated hazy opacity throughout the right lung, likely reflecting compressive atelectasis.  PMHx: DM, HLD, liver cirrhosis secondary to NASH. Type of Study: Bedside Swallow Evaluation Diet Prior to this Study: Full liquid diet Temperature Spikes Noted: No Respiratory Status: Room air History of Recent Intubation: No Behavior/Cognition: Cooperative;Pleasant mood (drowsy, adequately alert) Oral Cavity Assessment: Within Functional Limits Oral Care Completed by SLP: No Oral Cavity - Dentition: Adequate natural dentition Vision: Functional for self-feeding Self-Feeding Abilities: Able to feed self Patient Positioning: Upright in bed Baseline Vocal Quality: Normal Volitional Cough: Weak    Oral/Motor/Sensory Function Overall Oral Motor/Sensory Function: Within functional limits   Ice Chips Ice chips: Not tested   Thin Liquid Thin Liquid: Within functional limits    Nectar Thick Nectar Thick Liquid: Not tested   Honey Thick Honey Thick Liquid: Not tested   Puree Puree: Within functional limits   Solid     Solid: Not tested (on full liquids)      Dustin, Olam Bull 01/22/2024,4:22 PM

## 2024-01-22 NOTE — Care Management Important Message (Signed)
 Important Message  Patient Details  Name: David Esparza MRN: [id number] Date of Birth: [birth year]   Important Message Given:  Yes - Medicare IM     Claretta Deed 01/22/2024, 4:21 PM

## 2024-01-22 NOTE — Plan of Care (Signed)

## 2024-01-22 NOTE — Progress Notes (Signed)
 Initial Nutrition Assessment  DOCUMENTATION CODES:  Not applicable  INTERVENTION:  Ensure Plus High Protein po TID, each supplement provides 350 kcal and 20 grams of protein.  MVI with minerals daily. Education provided on 2 gm sodium diet, handouts added to discharge instructions: Cirrhosis Nutrition Therapy and  Low Sodium Nutrition Therapy  NUTRITION DIAGNOSIS:  Increased nutrient needs related to acute illness as evidenced by estimated needs.  GOAL:  Patient will meet greater than or equal to 90% of their needs  MONITOR:  PO intake, Supplement acceptance  REASON FOR ASSESSMENT:  Consult Assessment of nutrition requirement/status  ASSESSMENT:  74 yo male admitted with hepatic encephalopathy, decompensated liver cirrhosis, AKI. PMH includes MASH, cirrhosis, HTN, HLD, esophageal varices, ascites, hepatic hydrothorax, OSA, Bell's palsy, S/P TIPSS procedure 12/14/23, S/P R thoracentesis 12/14/23 & 01/08/24.  S/P paracentesis from L lower abdomen for diagnostics only today. Patient has been NPO this morning. Advanced to full liquids after procedure.   Spoke with patient's wife who has been trying to alter patient's diet at home to be more suitable for his liver disease. She has been focusing on decreasing sodium, fat, and sugar intake. She prepares the meals at home, but patient does not like it and goes out to eat to get something he likes, which is usually high in sodium and fat. She makes him chocolate shakes or fruit smoothies with protein added. He likes the Ensure supplements he's been receiving since coming to the hospital. She requests information on a diet that would be good for him.   On second visit to patient's room, provider was with patient, so RD provided handouts to wife. Unable to complete NFPE at this time.   Usual weight: 114.7 kg (08/03/23) Admit weight: 105 kg 8.5% weight loss x 6 months is not clinically significant and likely r/t fluids S/P thoracentesis x 2  in December.   Average Meal Intake: Not recorded  Nutritionally Relevant Medications: Scheduled Meds:  carvedilol   3.125 mg Oral BID WC   feeding supplement  237 mL Oral BID BM   lactulose   10 g Oral BID   lidocaine  (PF)  10 mL Intradermal Once   lidocaine  (PF)  30 mL Intradermal Once   pantoprazole  (PROTONIX ) IV  40 mg Intravenous Q12H   rifaximin   550 mg Oral BID   Continuous Infusions:  cefTRIAXone  (ROCEPHIN )  IV 2 g (01/21/24 1606)   dextrose  100 mL/hr at 01/22/24 1212   octreotide  (SANDOSTATIN ) 500 mcg in sodium chloride  0.9 % 250 mL (2 mcg/mL) infusion 50 mcg/hr (01/22/24 1158)   phytonadione  (VITAMIN K ) 10 mg in dextrose  5 % 50 mL IVPB 10 mg (01/22/24 1301)   PRN Meds:.albuterol , hydrALAZINE , ondansetron  (ZOFRAN ) IV  Labs Reviewed: Na 146 CBG 109 (01/16/23) HgbA1c 4.9 (12/14/23)  NUTRITION - FOCUSED PHYSICAL EXAM: Unable to complete  Diet Order:   Diet Order             Diet full liquid Room service appropriate? Yes; Fluid consistency: Thin  Diet effective now                   EDUCATION NEEDS:  Education needs have been addressed  Skin:  Skin Assessment: Reviewed RN Assessment  Last BM:  1/12 type 7  Height:  Ht Readings from Last 1 Encounters:  01/16/24 5' 7 (1.702 m)   Weight:  Wt Readings from Last 1 Encounters:  01/16/24 105 kg   Ideal Body Weight:  67.3 kg  BMI:  Body mass index  is 36.26 kg/m.  Estimated Nutritional Needs:  Kcal:  2150-2350 Protein:  110-135 gm Fluid:  1.8-2 L   Suzen HUNT RD, LDN, CNSC Contact via secure chat. If unavailable, use group chat RD Inpatient.

## 2024-01-22 NOTE — Progress Notes (Addendum)
 "    [facility] Progress Note  CC:  Cirrhosis, SBP  Subjective:  Feels ok.  No pain or emesis.  Stool in flexi-seal is quite dark.  Wife is at bedside.  Objective:  Vital signs in last 24 hours: Temp:  [97.6 F (36.4 C)-98.6 F (37 C)] 98 F (36.7 C) (01/12 0850) Pulse Rate:  [80-90] 83 (01/12 0850) Resp:  [17-22] 17 (01/12 0850) BP: (118-163)/(57-104) 119/69 (01/12 0850) SpO2:  [91 %-96 %] 96 % (01/12 0850) Last BM Date : 01/21/24 General:  Alert, in NAD Heart:  Regular rate and rhythm; no murmurs Pulm:  CTAB.  No W/R/R. Abdomen:  Soft, somewhat distended.  BS present.  Large umbilical hernia is noted that is non-tender. Neurologic:  Alert and oriented x 4.  Intake/Output from previous day: 01/11 0701 - 01/12 0700 In: -  Out: 600 [Stool:600]  Lab Results: Recent Labs    01/21/24 0348 01/21/24 0820 01/21/24 1155 01/21/24 2117  WBC 15.2*  --  9.0 10.9*  HGB 10.3* 8.6* 8.6* 8.3*  HCT 30.0* 24.9* 25.0* 24.4*  PLT 57*  --  38* 47*   BMET Recent Labs    01/20/24 0446 01/21/24 0348 01/22/24 0740  NA 139 145 146*  K 4.6 4.6 3.9  CL 104 109 109  CO2 26 23 24   GLUCOSE 121* 148* 159*  BUN 46* 57* 75*  CREATININE 1.53* 1.71* 1.90*  CALCIUM  8.4* 8.5* 8.5*   LFT Recent Labs    01/22/24 0740  PROT 5.9*  ALBUMIN  3.7  AST 18  ALT 11  ALKPHOS 58  BILITOT 3.0*   PT/INR Recent Labs    01/20/24 0446 01/22/24 0508  LABPROT 24.4* 30.5*  INR 2.1* 2.8*   US  Paracentesis Result Date: 01/20/2024 INDICATION: 74 year old male. History of cirrhosis status post tips. Found to have ascites. Request for therapeutic and diagnostic paracentesis. 3 L maximum EXAM: ULTRASOUND GUIDED THERAPEUTIC AND DIAGNOSTIC PARACENTESIS MEDICATIONS: Lidocaine  1% 10 mL COMPLICATIONS: None immediate. PROCEDURE: Informed written consent was obtained from the patient after a discussion of the risks, benefits and alternatives to treatment. A timeout was performed prior to the  initiation of the procedure. Initial ultrasound scanning demonstrates a moderate amount of ascites within the right lower abdominal quadrant. The right lower abdomen was prepped and draped in the usual sterile fashion. 1% lidocaine  was used for local anesthesia. Following this, a 19 gauge, 7-cm, Yueh catheter was introduced. An ultrasound image was saved for documentation purposes. The paracentesis was performed. The catheter was removed and a dressing was applied. The patient tolerated the procedure well without immediate post procedural complication. FINDINGS: A total of approximately 3 liters of straw-colored fluid was removed. Samples were sent to the laboratory as requested by the clinical team. IMPRESSION: Successful ultrasound-guided paracentesis yielding 3 liters of straw-colored peritoneal fluid. Performed by Delon Beagle NP Electronically Signed   By: Wilkie Lent M.D.   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  Hospitalization complicated by SBP and now upper GI bleed.   Hepatic encephalopathy; presenting problem At increased risk status post TIPS, compounded by medication nonadherence with lactulose  at home - Improved with Xifaxan  and lactulose  but still not back to baseline - Continue Xifaxan  550 mg twice daily.  Would recommend he stay on this indefinitely - Lactulose  ordered again as 10 grams BID.  SBP Diagnosed 1/10 (initial attempt of paracentesis with no adequate fluid pocket) with cell count showing PMNs of 800.  No organisms or WBCs seen on body fluid culture - Ceftriaxone  started 1/10 - Albumin  50 g every 8 hours - Recommend repeat paracentesis Tuesday to assess response, this has been ordered with cell count and culture for 1/13.  Upper GI bleed Symptoms began  evening of 1/10 and morning of 1/11 with coffee ground emesis and dark stools.  Patient had Grade II esophageal varices in 2024 but no history of bleeding.  S/p TIPS 12/4, but likely still ongoing portal hypertension - Hgb 8.6 down drom 10.3 yesterday morning.  Last episode of emesis 0730.  CBC still pending from today. - Octreotide  started 1/11, would continue x 72 hours. - Already on ceftriaxone . - Monitor Hgb. - Upper endoscopy on hold for now. - Ok for liquid diet.   Acute kidney injury Creatinine 2.6 on admission from baseline 1.2.  Improved with albumin  to 1.5, but now increasing again in setting of UGIB, up to 1.9 today - Lasix  had been restarted given worsening pleural effusion, but now on hold again. -- Will discuss with hospitalist regarding possible renal consult.   Hepatic hydrothorax, status post TIPS - Chest x-ray 1/9 showed increasing effusion - Lasix  20 restarted 1/9, stopped on 1/11 as above -Expect hepatic hydrothorax to continue to improve with lessening diuretic requirement given his newly placed TIPS   Decompensated MASH cirrhosis with refractory ascites/hepatic hydrothorax MELD 28 today -- I have ordered a dose of vitamin K  10 mg to be given IV today.  Repeat INR in the morning and can assess to see if he needs further dosing IV vs PO per pharmacy suggestion. Overall poor prognosis.  Patient is accumulating complications and cirrhotic decompensations. -- Repeat ultrasound with Doppler to reassess for TIPS patency today as he has had continued worsening/decline during his hospitalization.  MELD 3.0: 28 at 01/22/2024  7:40 AM MELD-Na: 28 at 01/22/2024  7:40 AM Calculated from: Serum Creatinine: 1.9 mg/dL at 8/87/7973  2:59 AM Serum Sodium: 146 mmol/L (Using max of 137 mmol/L) at 01/22/2024  7:40 AM Total Bilirubin: 3 mg/dL at 8/87/7973  2:59 AM Serum Albumin : 3.7 g/dL (Using max of 3.5 g/dL) at 8/87/7973  2:59 AM INR(ratio): 2.8 at 01/22/2024  5:08 AM Age at listing  (hypothetical): 18 years Sex: Male at 01/22/2024  7:40 AM    LOS: 6 days   Harlene D. Zehr  01/22/2024, 9:17 AM     Attending physician's note   I have taken history, reviewed the chart and examined the patient. I performed a substantive portion of this encounter, including complete performance of at least one of the key components, in conjunction with the APP. I agree with the Advanced Practitioner's note, impression and recommendations.  Got signout from Dr. Stacia.  Decompensated MASH cirrhosis with refractory ascites/hepatic hydrothorax s/p TIPS 12/4 complicated by hepatic encephalopathy.  Worsening MELD score. SBP- Dx 1/10 on ceftriaxone /alb. Rpt paracentesis done today UGI bleed-appears to have stabilized. Hb drifting down gradually. AKI-worsening. ?HRS. Cr 1.9  Plan: - Recheck US  Doppler for TIPS patency - Follow results of repeat paracentesis - Would continue lactulose /rifaximin . - Continue octreotide  x total of 72 hours. - Trend CBC, CMP, INR.  Keep Hb>7 - Vit K for possible nutrition induced coagulopathy. - Hold off on EGD unless active bleeding.  Risks may outweigh the benefits. - Stop Coreg  (due to hypotension which could potentially worsen renal insufficiency, not needed for EV  prophylaxis after TIPS) - Recommend renal consultation. - Hold off on diuretics. - Overall poor prognosis - D/W pt and wife.OK to full liquid diet.  Advance to low-salt renal diet in AM.   MELD 3.0: 28 at 01/22/2024  7:40 AM MELD-Na: 28 at 01/22/2024  7:40 AM Calculated from: Serum Creatinine: 1.9 mg/dL at 8/87/7973  2:59 AM Serum Sodium: 146 mmol/L (Using max of 137 mmol/L) at 01/22/2024  7:40 AM Total Bilirubin: 3 mg/dL at 8/87/7973  2:59 AM Serum Albumin : 3.7 g/dL (Using max of 3.5 g/dL) at 8/87/7973  2:59 AM INR(ratio): 2.8 at 01/22/2024  5:08 AM Age at listing (hypothetical): 19 years Sex: Male at 01/22/2024  7:40 AM   Anselm Bring, MD [facility] [phone]  "

## 2024-01-22 NOTE — Procedures (Signed)
 PROCEDURE SUMMARY:  Successful image-guided paracentesis from the left lower abdomen for diagnostics only as ordered.  Yielded 100 mL of cloudy amber fluid.  No immediate complications.  EBL: zero Patient tolerated well.   Specimen sent for labs.  Please see imaging section of Epic for full dictation.  Connie Lasater B Deral Schellenberg NP 01/22/2024 11:07 AM

## 2024-01-22 NOTE — Progress Notes (Signed)
 "                        PROGRESS NOTE        PATIENT DETAILS Name: David Esparza Age: 74 y.o. Sex: male Date of Birth: Jul 22, 1950 Admit Date: 01/16/2024 Admitting Physician Sigurd Pac, MD ERE:Aobuy, Harlene LABOR, MD  Brief Summary:  74 year old with cirrhosis-recent TIPS-noncompliance with lactulose -presented with hepatic encephalopathy and AKI.   Significant events:  1/6>>admit to [facility]  Significant studies:  1/6>> CXR: Interval worsening/opacification over mid/right lower lung suggestive of moderate-sized pleural effusion. 1/6>> CT head: No acute intracranial abnormality 1/6>> US  ascites: Large volume ascites 1/6>> US  liver Doppler: Moderate ascites, TIPS stent in place, hepatic cirrhosis. 1/7>> IR abdomen US  limited: No significant pocket for paracentesis 1/8>> US  renal: No hydronephrosis 1/8>> CXR: Persistent large right pleural effusion.  Significant microbiology data: None  Procedures: None  Consults: GI  Subjective: Patient in bed, appears comfortable, denies any headache, no fever, no chest pain or pressure, no shortness of breath , no abdominal pain. No focal weakness.  Objective:  Vitals: Blood pressure (!) 158/104, pulse 90, temperature 97.6 F (36.4 C), temperature source Axillary, resp. rate 17, height 5' 7 (1.702 m), weight 105 kg, SpO2 94%.   Exam:  Awake Alert, No new F.N deficits, Normal affect [place].AT,PERRAL Supple Neck, No JVD,   Symmetrical Chest wall movement, Good air movement bilaterally, CTAB RRR,No Gallops, Rubs or new Murmurs,  +ve B.Sounds, Abd Soft, No tenderness,   No Cyanosis, Clubbing or edema    Assessment/Plan:  Hepatic encephalopathy Recent TIPS-noncompliant/poorly compliant with lactulose  Continues to improve Continue lactulose /rifaximin , dose adjusted on 01/22/2024 still having profuse diarrhea causing dehydration, mentation has improved continue to monitor.   AKI, creatinine of 2.6 at time of admission Likely  hemodynamically mediated-in the setting of poor oral intake due to hepatic encephalopathy-ongoing lisinopril  use. Gradual improvement with IV albumin  and initial IV fluids, continue to monitor.  Hypernatremia.  Mild clinical dehydration.  Cut back on diuresis on 01/22/2024, gentle D5W for 5 hours and monitor.    Hepatic hydrothorax Not very symptomatic On room air this morning Lasix  and albumin  as blood pressure and electrolytes can tolerate Underwent diagnostic and therapeutic paracentesis on 01/20/2023 with 3-1/2 L of fluid removed, fluid also suggestive of SBP.   NASH liver cirrhosis, portal hypertension Recent TIPS 12//25 Thrombocytopenia likely secondary to hypersplenism, SBP Underwent paracentesis fluid suggestive of SBP, on appropriate antibiotics, follow cultures.  Dark emesis x 2 01/20/2024.  With history of NASH and portal hypertension has been placed on IV PPI and octreotide , type screen done will monitor CBC, keep him n.p.o., GI informed, if needed transfuse.  Patient and family agreeable for transfusion.  Has been started on low-dose Coreg  as well on 01/22/2024 as blood pressure is improved, continue to monitor.  Hypertension.  Placed on low-dose Coreg , titrate off midodrine  and monitor on 01/22/2024.  Class 2 Obesity: Estimated body mass index is 36.26 kg/m as calculated from the following:   Height as of this encounter: 5' 7 (1.702 m).   Weight as of this encounter: 105 kg.   Code status:   Code Status: Full Code   DVT Prophylaxis: SCDs Start: 01/17/24 0147   Family Communication: Spouse-Sharon and patient's daughter updated via room phone x 2 on 01/21/2024.  Spouse called on cell phone [phone] on 01/22/2024 at 8:50 AM   Disposition Plan: Status is: Inpatient Remains inpatient appropriate because: Severity of illness   Planned Discharge  Destination:Home   Diet: Diet Order             Diet NPO time specified Except for: Sips with Meds, Ice Chips  Diet  effective now                     Antimicrobial agents: Anti-infectives (From admission, onward)    Start     Dose/Rate Route Frequency Ordered Stop   01/20/24 1545  cefTRIAXone  (ROCEPHIN ) 2 g in sodium chloride  0.9 % 100 mL IVPB        2 g 200 mL/hr over 30 Minutes Intravenous Every 24 hours 01/20/24 1456     01/20/24 1530  cefoTAXime (CLAFORAN) 2 g in dextrose  5 % 50 mL IVPB  Status:  Discontinued        2 g 140 mL/hr over 30 Minutes Intravenous Every 12 hours 01/20/24 1431 01/20/24 1455   01/16/24 1445  rifaximin  (XIFAXAN ) tablet 550 mg        550 mg Oral 2 times daily 01/16/24 1440     01/16/24 1430  cefTRIAXone  (ROCEPHIN ) 2 g in sodium chloride  0.9 % 100 mL IVPB  Status:  Discontinued        2 g 200 mL/hr over 30 Minutes Intravenous Every 24 hours 01/16/24 1420 01/16/24 1449       MEDICATIONS: Scheduled Meds:  carvedilol   3.125 mg Oral BID WC   feeding supplement  237 mL Oral BID BM   lactulose   10 g Oral BID   lidocaine  (PF)  10 mL Intradermal Once   pantoprazole  (PROTONIX ) IV  40 mg Intravenous Q12H   rifaximin   550 mg Oral BID   Continuous Infusions:  cefTRIAXone  (ROCEPHIN )  IV 2 g (01/21/24 1606)   dextrose      octreotide  (SANDOSTATIN ) 500 mcg in sodium chloride  0.9 % 250 mL (2 mcg/mL) infusion 50 mcg/hr (01/22/24 0221)   PRN Meds:.albuterol , hydrALAZINE , ondansetron  (ZOFRAN ) IV   I have personally reviewed following labs and imaging studies  LABORATORY DATA: CBC: Recent Labs  Lab 01/19/24 0309 01/20/24 0446 01/21/24 0348 01/21/24 0820 01/21/24 1155 01/21/24 2117  WBC 7.5 10.5 15.2*  --  9.0 10.9*  NEUTROABS  --   --   --   --  8.1* 9.5*  HGB 11.7* 12.2* 10.3* 8.6* 8.6* 8.3*  HCT 33.1* 34.5* 30.0* 24.9* 25.0* 24.4*  MCV 101.2* 102.4* 104.9*  --  105.9* 105.2*  PLT 42* 58* 57*  --  38* 47*    Basic Metabolic Panel: Recent Labs  Lab 01/18/24 0358 01/19/24 0309 01/20/24 0446 01/21/24 0348 01/22/24 0740  NA 139 140 139 145 146*  K 4.0  4.1 4.6 4.6 3.9  CL 105 106 104 109 109  CO2 23 24 26 23 24   GLUCOSE 123* 132* 121* 148* 159*  BUN 54* 45* 46* 57* 75*  CREATININE 1.93* 1.66* 1.53* 1.71* 1.90*  CALCIUM  8.3* 8.3* 8.4* 8.5* 8.5*  MG  --   --   --   --  2.4    GFR: Estimated Creatinine Clearance: 39.4 mL/min (A) (by C-G formula based on SCr of 1.9 mg/dL (H)).  Liver Function Tests: Recent Labs  Lab 01/16/24 1108 01/17/24 0202 01/17/24 0940 01/18/24 0358 01/22/24 0740  AST 43* 29 28 25 18   ALT 25 18 18 16 11   ALKPHOS 99 72 83 67 58  BILITOT 3.8* 3.7* 3.1* 3.1* 3.0*  PROT 7.1 6.0* 6.1* 5.8* 5.9*  ALBUMIN  2.8* 2.9* 2.8* 3.0* 3.7   No  results for input(s): LIPASE, AMYLASE in the last 168 hours. Recent Labs  Lab 01/16/24 1108 01/17/24 0202 01/17/24 1425  AMMONIA 70* 88* 64*    Coagulation Profile: Recent Labs  Lab 01/16/24 1633 01/17/24 0202 01/20/24 0446 01/22/24 0508  INR 1.5* 1.7* 2.1* 2.8*    Cardiac Enzymes: No results for input(s): CKTOTAL, CKMB, CKMBINDEX, TROPONINI in the last 168 hours.  BNP (last 3 results) No results for input(s): PROBNP in the last 8760 hours.  Lipid Profile: No results for input(s): CHOL, HDL, LDLCALC, TRIG, CHOLHDL, LDLDIRECT in the last 72 hours.  Thyroid  Function Tests: No results for input(s): TSH, T4TOTAL, FREET4, T3FREE, THYROIDAB in the last 72 hours.  Anemia Panel: No results for input(s): VITAMINB12, FOLATE, FERRITIN, TIBC, IRON, RETICCTPCT in the last 72 hours.  Urine analysis:    Component Value Date/Time   COLORURINE YELLOW 01/22/2024 0654   APPEARANCEUR CLEAR 01/22/2024 0654   LABSPEC 1.014 01/22/2024 0654   PHURINE 5.0 01/22/2024 0654   GLUCOSEU NEGATIVE 01/22/2024 0654   GLUCOSEU NEGATIVE 03/08/2023 1144   HGBUR SMALL (A) 01/22/2024 0654   BILIRUBINUR NEGATIVE 01/22/2024 0654   BILIRUBINUR neg 02/21/2012 0819   KETONESUR NEGATIVE 01/22/2024 0654   PROTEINUR NEGATIVE 01/22/2024 0654    UROBILINOGEN 2.0 (A) 03/08/2023 1144   NITRITE NEGATIVE 01/22/2024 0654   LEUKOCYTESUR NEGATIVE 01/22/2024 0654    Sepsis Labs: Lactic Acid, Venous    Component Value Date/Time   LATICACIDVEN 1.7 12/14/2023 1821    MICROBIOLOGY: Recent Results (from the past 240 hours)  Body fluid culture w Gram Stain     Status: None (Preliminary result)   Collection Time: 01/20/24 12:35 PM   Specimen: PATH Cytology Peritoneal fluid  Result Value Ref Range Status   Specimen Description PERITONEAL  Final   Special Requests NONE  Final   Gram Stain NO WBC SEEN NO ORGANISMS SEEN   Final   Culture   Final    NO GROWTH < 24 HOURS Performed at [facility], [address] [place], [place] [address]    Report Status PENDING  Incomplete    RADIOLOGY STUDIES/RESULTS: US  Paracentesis Result Date: 01/20/2024 INDICATION: 74 year old male. History of cirrhosis status post tips. Found to have ascites. Request for therapeutic and diagnostic paracentesis. 3 L maximum EXAM: ULTRASOUND GUIDED THERAPEUTIC AND DIAGNOSTIC PARACENTESIS MEDICATIONS: Lidocaine  1% 10 mL COMPLICATIONS: None immediate. PROCEDURE: Informed written consent was obtained from the patient after a discussion of the risks, benefits and alternatives to treatment. A timeout was performed prior to the initiation of the procedure. Initial ultrasound scanning demonstrates a moderate amount of ascites within the right lower abdominal quadrant. The right lower abdomen was prepped and draped in the usual sterile fashion. 1% lidocaine  was used for local anesthesia. Following this, a 19 gauge, 7-cm, Yueh catheter was introduced. An ultrasound image was saved for documentation purposes. The paracentesis was performed. The catheter was removed and a dressing was applied. The patient tolerated the procedure well without immediate post procedural complication. FINDINGS: A total of approximately 3 liters of straw-colored fluid was removed. Samples were sent  to the laboratory as requested by the clinical team. IMPRESSION: Successful ultrasound-guided paracentesis yielding 3 liters of straw-colored peritoneal fluid. Performed by Delon Beagle NP Electronically Signed   By: Wilkie Lent M.D.   On: 01/20/2024 12:33     LOS: 6 days   Lavada Stank, MD  [facility]    To contact the attending provider between 7A-7P or the covering provider during after  hours 7P-7A, please log into the web site [email or web address] and access using universal [place] password for that web site. If you do not have the password, please call the hospital operator.  01/22/2024, 8:53 AM    "

## 2024-01-22 NOTE — Plan of Care (Signed)
" °  Problem: Clinical Measurements: Goal: Ability to maintain clinical measurements within normal limits will improve Outcome: Progressing Goal: Will remain free from infection Outcome: Progressing   Problem: Activity: Goal: Risk for activity intolerance will decrease Outcome: Progressing   Problem: Coping: Goal: Level of anxiety will decrease Outcome: Progressing   Problem: Elimination: Goal: Will not experience complications related to bowel motility Outcome: Progressing   Problem: Safety: Goal: Ability to remain free from injury will improve Outcome: Progressing   "

## 2024-01-23 DIAGNOSIS — K7469 Other cirrhosis of liver: Secondary | ICD-10-CM | POA: Diagnosis not present

## 2024-01-23 DIAGNOSIS — R188 Other ascites: Secondary | ICD-10-CM | POA: Diagnosis not present

## 2024-01-23 DIAGNOSIS — K7581 Nonalcoholic steatohepatitis (NASH): Secondary | ICD-10-CM | POA: Diagnosis not present

## 2024-01-23 DIAGNOSIS — K7682 Hepatic encephalopathy: Secondary | ICD-10-CM | POA: Diagnosis not present

## 2024-01-23 DIAGNOSIS — K7681 Hepatopulmonary syndrome: Secondary | ICD-10-CM | POA: Diagnosis not present

## 2024-01-23 LAB — RETICULOCYTES
Immature Retic Fract: 24.8 % — ABNORMAL HIGH (ref 2.3–15.9)
RBC.: 2.36 MIL/uL — ABNORMAL LOW (ref 4.22–5.81)
Retic Count, Absolute: 87.1 K/uL (ref 19.0–186.0)
Retic Ct Pct: 3.7 % — ABNORMAL HIGH (ref 0.4–3.1)

## 2024-01-23 LAB — DIC (DISSEMINATED INTRAVASCULAR COAGULATION)PANEL
D-Dimer, Quant: 12.55 ug{FEU}/mL — ABNORMAL HIGH (ref 0.00–0.50)
Fibrinogen: 108 mg/dL — ABNORMAL LOW (ref 210–475)
INR: 2 — ABNORMAL HIGH (ref 0.8–1.2)
Platelets: 52 K/uL — ABNORMAL LOW (ref 150–400)
Prothrombin Time: 23.7 s — ABNORMAL HIGH (ref 11.4–15.2)
Smear Review: NONE SEEN
aPTT: 32 s (ref 24–36)

## 2024-01-23 LAB — BODY FLUID CULTURE W GRAM STAIN
Culture: NO GROWTH
Gram Stain: NONE SEEN

## 2024-01-23 LAB — COMPREHENSIVE METABOLIC PANEL WITH GFR
ALT: 11 U/L (ref 0–44)
AST: 26 U/L (ref 15–41)
Albumin: 3.5 g/dL (ref 3.5–5.0)
Alkaline Phosphatase: 55 U/L (ref 38–126)
Anion gap: 10 (ref 5–15)
BUN: 68 mg/dL — ABNORMAL HIGH (ref 8–23)
CO2: 25 mmol/L (ref 22–32)
Calcium: 8.4 mg/dL — ABNORMAL LOW (ref 8.9–10.3)
Chloride: 107 mmol/L (ref 98–111)
Creatinine, Ser: 1.87 mg/dL — ABNORMAL HIGH (ref 0.61–1.24)
GFR, Estimated: 37 mL/min — ABNORMAL LOW
Glucose, Bld: 139 mg/dL — ABNORMAL HIGH (ref 70–99)
Potassium: 4 mmol/L (ref 3.5–5.1)
Sodium: 142 mmol/L (ref 135–145)
Total Bilirubin: 2.1 mg/dL — ABNORMAL HIGH (ref 0.0–1.2)
Total Protein: 5.8 g/dL — ABNORMAL LOW (ref 6.5–8.1)

## 2024-01-23 LAB — PROTIME-INR
INR: 2.3 — ABNORMAL HIGH (ref 0.8–1.2)
Prothrombin Time: 26.4 s — ABNORMAL HIGH (ref 11.4–15.2)

## 2024-01-23 LAB — CBC
HCT: 26.2 % — ABNORMAL LOW (ref 39.0–52.0)
Hemoglobin: 9.1 g/dL — ABNORMAL LOW (ref 13.0–17.0)
MCH: 36.4 pg — ABNORMAL HIGH (ref 26.0–34.0)
MCHC: 34.7 g/dL (ref 30.0–36.0)
MCV: 104.8 fL — ABNORMAL HIGH (ref 80.0–100.0)
Platelets: 26 K/uL — CL (ref 150–400)
RBC: 2.5 MIL/uL — ABNORMAL LOW (ref 4.22–5.81)
RDW: 16 % — ABNORMAL HIGH (ref 11.5–15.5)
WBC: 10.3 K/uL (ref 4.0–10.5)
nRBC: 0.2 % (ref 0.0–0.2)

## 2024-01-23 LAB — MAGNESIUM: Magnesium: 2.4 mg/dL (ref 1.7–2.4)

## 2024-01-23 LAB — IRON AND TIBC: Iron: 50 ug/dL (ref 45–182)

## 2024-01-23 LAB — HEPATITIS PANEL, ACUTE
HCV Ab: NONREACTIVE
Hep A IgM: NONREACTIVE
Hep B C IgM: NONREACTIVE
Hepatitis B Surface Ag: NONREACTIVE

## 2024-01-23 LAB — VITAMIN B12: Vitamin B-12: 1690 pg/mL — ABNORMAL HIGH (ref 180–914)

## 2024-01-23 LAB — FERRITIN: Ferritin: 524 ng/mL — ABNORMAL HIGH (ref 24–336)

## 2024-01-23 LAB — LACTATE DEHYDROGENASE: LDH: 239 U/L — ABNORMAL HIGH (ref 105–235)

## 2024-01-23 MED ORDER — MIDODRINE HCL 5 MG PO TABS
5.0000 mg | ORAL_TABLET | Freq: Three times a day (TID) | ORAL | Status: DC
  Administered 2024-01-23 (×2): 5 mg via ORAL
  Filled 2024-01-23 (×2): qty 1

## 2024-01-23 NOTE — Progress Notes (Signed)
 Received the called from Lab - pt having critical lab value for platelets 26k. Made Opyd aware.

## 2024-01-23 NOTE — Progress Notes (Signed)
 "    Progress Note    ASSESSMENT AND PLAN:   Occluded TIPS on US  1/12 with worsening ascites- IR consulted. 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  Worsening MELD score. SBP- Dx 1/10 on ceftriaxone /alb. Rpt paracentesis done yesterday with some improvement. UGI bleed-appears to have stabilized on octreotide /Protonix  Worsening thrombocytopenia AKI-multifactorial with component of HRS. Cr 1.9  Plan: - Appreciate IR consultation for TIPS revision 1/14.  Would ask IR to repeat paracenteses in AM (along with TIPS revision) and send fluid for cell count, cultures - For now continue ceftriaxone .   - Continue lactulose /rifaximin . - Appreciate renal and hematology consultation - Overall poor prognosis.   MELD 3.0: 23 at 01/23/2024  5:52 PM MELD-Na: 23 at 01/23/2024  5:52 PM Calculated from: Serum Creatinine: 1.87 mg/dL at 8/86/7973  6:90 AM Serum Sodium: 142 mmol/L (Using max of 137 mmol/L) at 01/23/2024  3:09 AM Total Bilirubin: 2.1 mg/dL at 8/86/7973  6:90 AM Serum Albumin : 3.5 g/dL at 8/86/7973  6:90 AM INR(ratio): 2 at 01/23/2024  5:52 PM Age at listing (hypothetical): 35 years Sex: Male at 01/23/2024  5:52 PM    SUBJECTIVE   Denies having any significant complaints except increasing abdominal distention. No melena or hematochezia.    OBJECTIVE:     Vital signs in last 24 hours: Temp:  [97.1 F (36.2 C)-97.6 F (36.4 C)] 97.6 F (36.4 C) (01/13 1624) Pulse Rate:  [76-81] 76 (01/13 1624) Resp:  [14-21] 14 (01/13 1624) BP: (105-132)/(63-80) 112/63 (01/13 1624) SpO2:  [94 %-98 %] 98 % (01/13 1624) Last BM Date : 01/23/24 General:   Alert, well-developed, in NAD EENT:  Normal hearing, non icteric sclera, conjunctive pink.  Pulm: Normal respiratory effort, lungs CTA bilaterally without wheezes or crackles. Abdomen:  Soft, distended, nontender.  Normal bowel sounds,.          Intake/Output from  previous day: 01/12 0701 - 01/13 0700 In: -  Out: 1275 [Urine:1275] Intake/Output this shift: No intake/output data recorded.  Lab Results: Recent Labs    01/21/24 2117 01/22/24 1232 01/23/24 0309 01/23/24 1752  WBC 10.9* 11.3* 10.3  --   HGB 8.3* 8.8* 9.1*  --   HCT 24.4* 25.4* 26.2*  --   PLT 47* 44* 26* 52*   BMET Recent Labs    01/21/24 0348 01/22/24 0740 01/23/24 0309  NA 145 146* 142  K 4.6 3.9 4.0  CL 109 109 107  CO2 23 24 25   GLUCOSE 148* 159* 139*  BUN 57* 75* 68*  CREATININE 1.71* 1.90* 1.87*  CALCIUM  8.5* 8.5* 8.4*   LFT Recent Labs    01/23/24 0309  PROT 5.8*  ALBUMIN  3.5  AST 26  ALT 11  ALKPHOS 55  BILITOT 2.1*   PT/INR Recent Labs    01/23/24 0309 01/23/24 1752  LABPROT 26.4* 23.7*  INR 2.3* 2.0*   Hepatitis Panel Recent Labs    01/23/24 1752  HEPBSAG NON REACTIVE  HCVAB NON REACTIVE  HEPAIGM PENDING  HEPBIGM NON REACTIVE    US  ABD LTD RUG W/LIVER DOPPLER Result Date: 01/22/2024 EXAM: US  Duplex Arterial/Venous of the Abdomen, Complete. TECHNIQUE: Real-time duplex ultrasound scan of the arterial and venous flow of the pelvis with color Doppler flow and spectral waveform analysis. COMPARISON: US  Hepatic liver 01/16/2024. CLINICAL HISTORY: S/P TIPS (transjugular intrahepatic portosystemic shunt). Post-TIPS creation 12/14/2023. FINDINGS: PORTAL VEINS: Normal velocity. Hepatopetal flow. Antegrade flow in the main portal vein. No definite varices were identified. HEPATIC  ARTERY: Normal flow. HEPATIC VEINS: Continuous hepatic flow in right and middle hepatic veins. SPLENIC VEIN: Normal flow. TIPS: No definite color flow signal in the TIPS. Nondirectional monophasic flow signal is recorded in the region of the TIPS by the sonographer. LIVER: Nodular hepatic contour. GALLBLADDER: Gallbladder wall thickness 2.9 mm. BILE DUCTS: CBD diameter of 3.8 mm. ABDOMEN: Small volume abdominal ascites. LIMITATIONS/ARTIFACTS: Technically difficult study  secondary to body habitus. IMPRESSION: 1. No color flow within the TIPS, with monophasic nondirectional flow in the region of the TIPS, concerning for TIPS dysfunction or occlusion. 2. Small volume abdominal ascites. Electronically signed by: Katheleen Faes MD MD 01/22/2024 02:04 PM EST RP Workstation: [id number]   IR Paracentesis Result Date: 01/22/2024 INDICATION: 74 year old male with history of MASH and cirrhosis with ascites. History of TIPS procedure performed on 12/14/2023. Received request for diagnostic paracentesis only. EXAM: ULTRASOUND GUIDED DIAGNOSTIC, LEFT-SIDED PARACENTESIS MEDICATIONS: 6 mL 1% lidocaine  with epinephrine  COMPLICATIONS: None immediate. PROCEDURE: Informed written consent was obtained from the patient after a discussion of the risks, benefits and alternatives to treatment. A timeout was performed prior to the initiation of the procedure. Initial ultrasound scanning demonstrates a small amount of ascites within the right lower abdominal quadrant. The right lower abdomen was prepped and draped in the usual sterile fashion. 1% lidocaine  with epinephrine  was used for local anesthesia. Following this, a 19 gauge, 7-cm, Yueh catheter was introduced. An ultrasound image was saved for documentation purposes. The paracentesis was performed. The catheter was removed and a dressing was applied. The patient tolerated the procedure well without immediate post procedural complication. FINDINGS: A total of approximately 100 mL of cloudy amber fluid was removed. Samples were sent to the laboratory as requested by the clinical team. IMPRESSION: Successful ultrasound-guided paracentesis yielding 100 mL of peritoneal fluid. Performed by: Rayfield Buff, NP under the supervision of Dr. Cordella Banner Electronically Signed   By: Cordella Banner   On: 01/22/2024 12:24     Principal Problem:   Hepatic encephalopathy (HCC) Active Problems:   Liver cirrhosis secondary to NASH (HCC)   S/P TIPS  (transjugular intrahepatic portosystemic shunt)   AKI (acute kidney injury)   Pleural effusion associated with hepatic disorder   SBP (spontaneous bacterial peritonitis) (HCC)   Upper GI bleed     LOS: 7 days     Anselm Bring, MD 01/23/2024, 7:47 PM [facility] [phone]       "

## 2024-01-23 NOTE — Progress Notes (Signed)
 Physical Therapy Treatment Patient Details Name: David Esparza MRN: [id number] DOB: Jul 24, 1950 Today's Date: 01/23/2024   History of Present Illness Pt is a 74 y/o M who presented to [facility] ED 01/16/24 for AMS x2-3 days. Wife notes pt has been confused recently and non-compliant with his medications. Had a TIPS procedure on 12/4 along with right thoracentesis and left gastric vein embolization.  Patient had a pulmonology appointment on 12/19 where patient was dealing with some fluid overload post-procedure. PMHx: DM, HLD, liver cirrhosis secondary to NASH.    PT Comments  Pt received in supine and agreeable to session. Pt demonstrates some slowed processing and responses with mostly 1 word responses during session. Pt able to tolerate slightly increased gait distance with RW support. Pt requires cues for problem solving and navigation during turns and around obstacles. Pt declines further mobility or exercises due to fatigue. Pt continues to benefit from PT services to progress toward functional mobility goals.     If plan is discharge home, recommend the following: A little help with walking and/or transfers;A little help with bathing/dressing/bathroom;Assistance with cooking/housework;Assist for transportation;Help with stairs or ramp for entrance;Direct supervision/assist for medications management;Direct supervision/assist for financial management;Supervision due to cognitive status   Can travel by private vehicle        Equipment Recommendations  Cane;Rolling walker (2 wheels);BSC/3in1    Recommendations for Other Services       Precautions / Restrictions Precautions Precautions: Fall Recall of Precautions/Restrictions: Intact Precaution/Restrictions Comments: fecal tube Restrictions Weight Bearing Restrictions Per Provider Order: No     Mobility  Bed Mobility Overal bed mobility: Needs Assistance Bed Mobility: Supine to Sit     Supine to sit: HOB elevated, Min assist, Used  rails     General bed mobility comments: increased time and use of bed features. min A for trunk elevation    Transfers Overall transfer level: Needs assistance Equipment used: Rolling walker (2 wheels) Transfers: Sit to/from Stand Sit to Stand: Min assist           General transfer comment: from low bed height with increased time and light min A for rise    Ambulation/Gait Ambulation/Gait assistance: Contact guard assist Gait Distance (Feet): 150 Feet Assistive device: Rolling walker (2 wheels) Gait Pattern/deviations: Step-through pattern, Decreased stride length, Trunk flexed       General Gait Details: Pt demonstrates slowed gait with flexed posture despite cues. Cues for RW proximity. Slow, wide turns and difficulty with obstacle negotiation   Stairs             Wheelchair Mobility     Tilt Bed    Modified Rankin (Stroke Patients Only)       Balance Overall balance assessment: Needs assistance Sitting-balance support: No upper extremity supported, Feet supported Sitting balance-Leahy Scale: Fair Sitting balance - Comments: EOB   Standing balance support: Reliant on assistive device for balance, Bilateral upper extremity supported, During functional activity Standing balance-Leahy Scale: Poor Standing balance comment: reliant on RW support                            Communication Communication Communication: Impaired Factors Affecting Communication: Difficulty expressing self;Reduced clarity of speech (slight dysarthria and mostly 1 word responses)  Cognition Arousal: Alert Behavior During Therapy: WFL for tasks assessed/performed   PT - Cognitive impairments: Awareness, Problem solving, Safety/Judgement  PT - Cognition Comments: Slow to respond and initiate. Pt mostly with 1 word answers and possibly slightly dysarthric Following commands: Intact Following commands impaired: Follows one step commands  with increased time    Cueing Cueing Techniques: Verbal cues  Exercises      General Comments        Pertinent Vitals/Pain Pain Assessment Pain Assessment: No/denies pain     PT Goals (current goals can now be found in the care plan section) Acute Rehab PT Goals Patient Stated Goal: None stated Progress towards PT goals: Progressing toward goals    Frequency    Min 1X/week       AM-PAC PT 6 Clicks Mobility   Outcome Measure  Help needed turning from your back to your side while in a flat bed without using bedrails?: None Help needed moving from lying on your back to sitting on the side of a flat bed without using bedrails?: None Help needed moving to and from a bed to a chair (including a wheelchair)?: A Little Help needed standing up from a chair using your arms (e.g., wheelchair or bedside chair)?: A Little Help needed to walk in hospital room?: A Little Help needed climbing 3-5 steps with a railing? : A Lot 6 Click Score: 19    End of Session Equipment Utilized During Treatment: Gait belt Activity Tolerance: Patient tolerated treatment well;Patient limited by fatigue Patient left: with call bell/phone within reach;in chair;with family/visitor present Nurse Communication: Mobility status PT Visit Diagnosis: Unsteadiness on feet (R26.81);Muscle weakness (generalized) (M62.81)     Time: 8891-8862 PT Time Calculation (min) (ACUTE ONLY): 29 min  Charges:    $Gait Training: 8-22 mins $Therapeutic Activity: 8-22 mins PT General Charges $$ ACUTE PT VISIT: 1 Visit                    Darryle George, PTA Acute Rehabilitation Services Secure Chat Preferred  [phone]    Darryle George 01/23/2024, 12:51 PM

## 2024-01-23 NOTE — Progress Notes (Signed)
 "                        PROGRESS NOTE        PATIENT DETAILS Name: David Esparza Age: 74 y.o. Sex: male Date of Birth: 06-24-1950 Admit Date: 01/16/2024 Admitting Physician Sigurd Pac, MD ERE:Aobuy, Harlene LABOR, MD  Brief Summary:  74 year old with cirrhosis-recent TIPS-noncompliance with lactulose -presented with hepatic encephalopathy and AKI.   Significant events:  1/6>>admit to [facility]  Significant studies:  1/6>> CXR: Interval worsening/opacification over mid/right lower lung suggestive of moderate-sized pleural effusion. 1/6>> CT head: No acute intracranial abnormality 1/6>> US  ascites: Large volume ascites 1/6>> US  liver Doppler: Moderate ascites, TIPS stent in place, hepatic cirrhosis. 1/7>> IR abdomen US  limited: No significant pocket for paracentesis 1/8>> US  renal: No hydronephrosis 1/8>> CXR: Persistent large right pleural effusion.  Significant microbiology data: None  Procedures: None  Consults: GI  Subjective: Patient in bed, appears comfortable, denies any headache, no fever, no chest pain or pressure, no shortness of breath , no abdominal pain. No focal weakness.  Objective:  Vitals: Blood pressure 105/78, pulse 77, temperature 97.6 F (36.4 C), temperature source Oral, resp. rate 17, height 5' 7 (1.702 m), weight 105 kg, SpO2 96%.   Exam:  Awake Alert, No new F.N deficits, Normal affect [place].AT,PERRAL Supple Neck, No JVD,   Symmetrical Chest wall movement, Good air movement bilaterally, CTAB RRR,No Gallops, Rubs or new Murmurs,  +ve B.Sounds, Abd Soft, No tenderness,   No Cyanosis, Clubbing or edema    Assessment/Plan:  Hepatic encephalopathy Recent TIPS-noncompliant/poorly compliant with lactulose  Continues to improve Continue lactulose /rifaximin , dose adjusted on 01/22/2024 still having profuse diarrhea causing dehydration, mentation has improved and he is now at his baseline.   AKI, creatinine of 2.6 at time of admission Likely  hemodynamically mediated-in the setting of poor oral intake due to hepatic encephalopathy-ongoing lisinopril  use. Gradual improvement with IV albumin  and initial IV fluids, continue to monitor.  Nephrology on board as well managing diuretics and fluid status.  Hypernatremia.  Resolved after gentle D5W on 01/22/2024 and discontinuation of Lasix  temporarily.  Hepatic hydrothorax and ascites with SBP Not very symptomatic On room air this morning Lasix  and albumin  as blood pressure and electrolytes can tolerate Underwent diagnostic and therapeutic paracentesis on 01/20/2023 with 3-1/2 L of fluid removed, fluid also suggestive of SBP.   NASH liver cirrhosis, portal hypertension, recent TIPS 01/04/2024.  SBP now. Thrombocytopenia likely secondary to hypersplenism Underwent paracentesis fluid suggestive of SBP, on appropriate antibiotics, follow cultures. Note thrombocytopenia getting worse, will get hematology input on 01/23/2024, currently not bleeding.  Type screen is done.  Continue to monitor   Dark emesis x 2 01/20/2024.  With history of NASH and portal hypertension has been placed on IV PPI and octreotide , type screen done GI following, monitor CBC.  Patient and family agreeable for transfusion if needed.     Hypertension.  Blood pressure soft again, low-dose midodrine .  And monitor.  Class 2 Obesity: Estimated body mass index is 36.26 kg/m as calculated from the following:   Height as of this encounter: 5' 7 (1.702 m).   Weight as of this encounter: 105 kg.   Code status:   Code Status: Full Code   DVT Prophylaxis: SCDs Start: 01/17/24 0147   Family Communication: Spouse-Sharon and patient's daughter updated via room phone x 2 on 01/21/2024.  Spouse called on cell phone [phone] on 01/22/2024 at 8:50 AM and updated, updated again 01/23/2024  Disposition Plan: Status is: Inpatient Remains inpatient appropriate because: Severity of illness   Planned Discharge  Destination:Home   Diet: Diet Order             Diet full liquid Room service appropriate? Yes; Fluid consistency: Thin  Diet effective now                     Antimicrobial agents: Anti-infectives (From admission, onward)    Start     Dose/Rate Route Frequency Ordered Stop   01/20/24 1545  cefTRIAXone  (ROCEPHIN ) 2 g in sodium chloride  0.9 % 100 mL IVPB        2 g 200 mL/hr over 30 Minutes Intravenous Every 24 hours 01/20/24 1456     01/20/24 1530  cefoTAXime (CLAFORAN) 2 g in dextrose  5 % 50 mL IVPB  Status:  Discontinued        2 g 140 mL/hr over 30 Minutes Intravenous Every 12 hours 01/20/24 1431 01/20/24 1455   01/16/24 1445  rifaximin  (XIFAXAN ) tablet 550 mg        550 mg Oral 2 times daily 01/16/24 1440     01/16/24 1430  cefTRIAXone  (ROCEPHIN ) 2 g in sodium chloride  0.9 % 100 mL IVPB  Status:  Discontinued        2 g 200 mL/hr over 30 Minutes Intravenous Every 24 hours 01/16/24 1420 01/16/24 1449       MEDICATIONS: Scheduled Meds:  feeding supplement  237 mL Oral TID BM   lactulose   10 g Oral BID   lidocaine  (PF)  10 mL Intradermal Once   lidocaine  (PF)  30 mL Intradermal Once   multivitamin with minerals  1 tablet Oral Daily   pantoprazole  (PROTONIX ) IV  40 mg Intravenous Q12H   rifaximin   550 mg Oral BID   Continuous Infusions:  cefTRIAXone  (ROCEPHIN )  IV 2 g (01/22/24 1508)   octreotide  (SANDOSTATIN ) 500 mcg in sodium chloride  0.9 % 250 mL (2 mcg/mL) infusion 50 mcg/hr (01/23/24 0000)   PRN Meds:.albuterol , hydrALAZINE , ondansetron  (ZOFRAN ) IV   I have personally reviewed following labs and imaging studies  LABORATORY DATA:   Data Review:   Patient Lines/Drains/Airways Status     Active Line/Drains/Airways     Name Placement date Placement time Site Days   Peripheral IV 01/20/24 22 G 1 Left;Posterior Hand 01/20/24  2150  Hand  3   Peripheral IV 01/21/24 20 G 1.75 Anterior;Distal;Left;Upper Arm 01/21/24  1317  Arm  2   Fecal Management  System 35 mL 01/21/24  1230  -- 2   Wound 12/14/23 Neck Right 12/14/23  --  Neck  40   Wound 12/14/23 Groin Right 12/14/23  --  Groin  40             Inpatient Medications  Scheduled Meds:  feeding supplement  237 mL Oral TID BM   lactulose   10 g Oral BID   lidocaine  (PF)  10 mL Intradermal Once   lidocaine  (PF)  30 mL Intradermal Once   multivitamin with minerals  1 tablet Oral Daily   pantoprazole  (PROTONIX ) IV  40 mg Intravenous Q12H   rifaximin   550 mg Oral BID   Continuous Infusions:  cefTRIAXone  (ROCEPHIN )  IV 2 g (01/22/24 1508)   octreotide  (SANDOSTATIN ) 500 mcg in sodium chloride  0.9 % 250 mL (2 mcg/mL) infusion 50 mcg/hr (01/23/24 0000)   PRN Meds:.albuterol , hydrALAZINE , ondansetron  (ZOFRAN ) IV  DVT Prophylaxis  SCDs Start: 01/17/24 0147   Recent Labs  Lab 01/21/24 0348 01/21/24 0820 01/21/24 1155 01/21/24 2117 01/22/24 1232 01/23/24 0309  WBC 15.2*  --  9.0 10.9* 11.3* 10.3  HGB 10.3* 8.6* 8.6* 8.3* 8.8* 9.1*  HCT 30.0* 24.9* 25.0* 24.4* 25.4* 26.2*  PLT 57*  --  38* 47* 44* 26*  MCV 104.9*  --  105.9* 105.2* 105.4* 104.8*  MCH 36.0*  --  36.4* 35.8* 36.5* 36.4*  MCHC 34.3  --  34.4 34.0 34.6 34.7  RDW 15.9*  --  16.0* 16.1* 16.0* 16.0*  LYMPHSABS  --   --  0.4* 0.5* 0.5*  --   MONOABS  --   --  0.3 0.7 0.7  --   EOSABS  --   --  0.0 0.1 0.1  --   BASOSABS  --   --  0.0 0.0 0.0  --     Recent Labs  Lab 01/16/24 1108 01/16/24 1633 01/17/24 0202 01/17/24 0940 01/17/24 1425 01/18/24 0358 01/19/24 0309 01/20/24 0446 01/21/24 0348 01/22/24 0508 01/22/24 0740 01/23/24 0309  NA 133*  --  137 136  --  139 140 139 145  --  146* 142  K 4.8  --  4.0 4.0  --  4.0 4.1 4.6 4.6  --  3.9 4.0  CL 98  --  102 103  --  105 106 104 109  --  109 107  CO2 21*  --  22 21*  --  23 24 26 23   --  24 25  ANIONGAP 14  --  13 12  --  11 9 9 13   --  12 10  GLUCOSE 110*  --  128* 130*  --  123* 132* 121* 148*  --  159* 139*  BUN 63*  --  65* 62*  --  54* 45*  46* 57*  --  75* 68*  CREATININE 2.61*  --  2.35* 2.09*  --  1.93* 1.66* 1.53* 1.71*  --  1.90* 1.87*  AST 43*  --  29 28  --  25  --   --   --   --  18 26  ALT 25  --  18 18  --  16  --   --   --   --  11 11  ALKPHOS 99  --  72 83  --  67  --   --   --   --  58 55  BILITOT 3.8*  --  3.7* 3.1*  --  3.1*  --   --   --   --  3.0* 2.1*  ALBUMIN  2.8*  --  2.9* 2.8*  --  3.0*  --   --   --   --  3.7 3.5  INR  --  1.5* 1.7*  --   --   --   --  2.1*  --  2.8*  --  2.3*  AMMONIA 70*  --  88*  --  64*  --   --   --   --   --   --   --   MG  --   --   --   --   --   --   --   --   --   --  2.4 2.4  CALCIUM  8.6*  --  8.5* 8.2*  --  8.3* 8.3* 8.4* 8.5*  --  8.5* 8.4*      Recent Labs  Lab 01/16/24 1108 01/16/24 1633 01/17/24 0202 01/17/24 0940 01/17/24 1425  01/18/24 0358 01/19/24 0309 01/20/24 0446 01/21/24 0348 01/22/24 0508 01/22/24 0740 01/23/24 0309  INR  --  1.5* 1.7*  --   --   --   --  2.1*  --  2.8*  --  2.3*  AMMONIA 70*  --  88*  --  64*  --   --   --   --   --   --   --   MG  --   --   --   --   --   --   --   --   --   --  2.4 2.4  CALCIUM  8.6*  --  8.5*   < >  --    < > 8.3* 8.4* 8.5*  --  8.5* 8.4*   < > = values in this interval not displayed.    --------------------------------------------------------------------------------------------------------------- Lab Results  Component Value Date   CHOL 143 03/28/2023   HDL 46.20 03/28/2023   LDLCALC 71 03/28/2023   LDLDIRECT 110.0 12/09/2020   TRIG 127.0 03/28/2023   CHOLHDL 3 03/28/2023    Lab Results  Component Value Date   HGBA1C 4.9 12/14/2023   No results for input(s): TSH, T4TOTAL, FREET4, T3FREE, THYROIDAB in the last 72 hours. No results for input(s): VITAMINB12, FOLATE, FERRITIN, TIBC, IRON, RETICCTPCT in the last 72 hours. ------------------------------------------------------------------------------------------------------------------ Cardiac Enzymes No results for input(s): CKMB,  TROPONINI, MYOGLOBIN in the last 168 hours.  Invalid input(s): CK  Micro Results Recent Results (from the past 240 hours)  Body fluid culture w Gram Stain     Status: None (Preliminary result)   Collection Time: 01/20/24 12:35 PM   Specimen: PATH Cytology Peritoneal fluid  Result Value Ref Range Status   Specimen Description PERITONEAL  Final   Special Requests NONE  Final   Gram Stain NO WBC SEEN NO ORGANISMS SEEN   Final   Culture   Final    NO GROWTH 2 DAYS Performed at [facility], [address] [place], [place] [address]    Report Status PENDING  Incomplete  Gram stain     Status: None   Collection Time: 01/22/24 11:01 AM   Specimen: Fluid  Result Value Ref Range Status   Specimen Description FLUID PERITONEAL ABDOMEN  Final   Special Requests NONE  Final   Gram Stain   Final    FEW WBC PRESENT, PREDOMINANTLY MONONUCLEAR NO ORGANISMS SEEN Performed at [facility], [address], [place], [place] [address]    Report Status 01/22/2024 FINAL  Final    Radiology Reports  US  ABD LTD RUG W/LIVER DOPPLER Result Date: 01/22/2024 EXAM: US  Duplex Arterial/Venous of the Abdomen, Complete. TECHNIQUE: Real-time duplex ultrasound scan of the arterial and venous flow of the pelvis with color Doppler flow and spectral waveform analysis. COMPARISON: US  Hepatic liver 01/16/2024. CLINICAL HISTORY: S/P TIPS (transjugular intrahepatic portosystemic shunt). Post-TIPS creation 12/14/2023. FINDINGS: PORTAL VEINS: Normal velocity. Hepatopetal flow. Antegrade flow in the main portal vein. No definite varices were identified. HEPATIC ARTERY: Normal flow. HEPATIC VEINS: Continuous hepatic flow in right and middle hepatic veins. SPLENIC VEIN: Normal flow. TIPS: No definite color flow signal in the TIPS. Nondirectional monophasic flow signal is recorded in the region of the TIPS by the sonographer. LIVER: Nodular hepatic contour. GALLBLADDER: Gallbladder wall thickness 2.9 mm. BILE  DUCTS: CBD diameter of 3.8 mm. ABDOMEN: Small volume abdominal ascites. LIMITATIONS/ARTIFACTS: Technically difficult study secondary to body habitus. IMPRESSION: 1. No color flow within the TIPS,  with monophasic nondirectional flow in the region of the TIPS, concerning for TIPS dysfunction or occlusion. 2. Small volume abdominal ascites. Electronically signed by: Katheleen Faes MD MD 01/22/2024 02:04 PM EST RP Workstation: [id number]   IR Paracentesis Result Date: 01/22/2024 INDICATION: 75 year old male with history of MASH and cirrhosis with ascites. History of TIPS procedure performed on 12/14/2023. Received request for diagnostic paracentesis only. EXAM: ULTRASOUND GUIDED DIAGNOSTIC, LEFT-SIDED PARACENTESIS MEDICATIONS: 6 mL 1% lidocaine  with epinephrine  COMPLICATIONS: None immediate. PROCEDURE: Informed written consent was obtained from the patient after a discussion of the risks, benefits and alternatives to treatment. A timeout was performed prior to the initiation of the procedure. Initial ultrasound scanning demonstrates a small amount of ascites within the right lower abdominal quadrant. The right lower abdomen was prepped and draped in the usual sterile fashion. 1% lidocaine  with epinephrine  was used for local anesthesia. Following this, a 19 gauge, 7-cm, Yueh catheter was introduced. An ultrasound image was saved for documentation purposes. The paracentesis was performed. The catheter was removed and a dressing was applied. The patient tolerated the procedure well without immediate post procedural complication. FINDINGS: A total of approximately 100 mL of cloudy amber fluid was removed. Samples were sent to the laboratory as requested by the clinical team. IMPRESSION: Successful ultrasound-guided paracentesis yielding 100 mL of peritoneal fluid. Performed by: Rayfield Buff, NP under the supervision of Dr. Cordella Banner Electronically Signed   By: Cordella Banner   On: 01/22/2024 12:24       Signature  -   Lavada Stank M.D on 01/23/2024 at 8:59 AM   -  To page go to [email or web address]      "

## 2024-01-23 NOTE — Plan of Care (Signed)
  Problem: Clinical Measurements: Goal: Ability to maintain clinical measurements within normal limits will improve Outcome: Progressing Goal: Will remain free from infection Outcome: Progressing   Problem: Activity: Goal: Risk for activity intolerance will decrease Outcome: Progressing   Problem: Nutrition: Goal: Adequate nutrition will be maintained Outcome: Progressing   Problem: Elimination: Goal: Will not experience complications related to bowel motility Outcome: Progressing   Problem: Safety: Goal: Ability to remain free from injury will improve Outcome: Progressing   Problem: Skin Integrity: Goal: Risk for impaired skin integrity will decrease Outcome: Progressing

## 2024-01-23 NOTE — Consult Note (Addendum)
 "  [facility]  [phone] [phone]    HEMATOLOGY CONSULTATION  PURPOSE OF CONSULTATION/CHIEF COMPLAINT:   consult for severe thrombocytopenia likely due to NASH related hypersplenism.   Referring MD:  Dr. Lavada Stank   HPI: David Esparza. David Esparza is a pleasant 74 year old male patient who was brought to the ED on 01/16/2024 from home by family complaining of altered mental status x 2 days.  Patient had TIPS procedure 1 month prior with subsequent abdominal distention, diarrhea and poor p.o. intake.  They noted he was becoming sluggish and confused.  Patient was refusing to come in to the hospital and wife states that she called emergency services to bring him in for further evaluation.  Hematology evaluation has been requested due to low platelets. Patient is seen sitting out of bed in chair at bedside.  He is awake alert and oriented to person and place however not to time, states it is 1927.  Wife served as principal historian.  Patient's son and daughter also at bedside.  Patient's wife states that he was found to have fatty liver over 1 year ago.  States that he had TIPS procedure done 12/16/2023.  Has also had fluid removed from his lungs frequently.  She states that today is the best that he has looked since admission although admits that his mentation waxes and wanes. Family history includes mother who died of alcoholic cirrhosis.  Patient did not know his father. Social history is noncontributory, denies tobacco use, admits to minimal alcohol use-beer 1 time per month, denies recreational or illicit drug use.  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    ASSESSMENT AND PLAN:  Thrombocytopenia, severe - Acute on chronic.  Platelets low 26K today. - Baseline platelets noted to be low, 63K on 08/03/2023. -  Likely due to hepatic dysfunction and hypersplenism - Recommend platelet transfusion for counts <20 K or <50 K with active bleeding. - Continue to monitor CBC with differential  NASH Cirrhosis Abdominal distention - Has 1 year history of cirrhosis.   - Status post paracentesis with 3 L straw-colored fluid removed - On octreotide  -Avoid hepatotoxic agents. - Monitor hepatic function - Continue supportive care  Hepatic encephalopathy - Noncompliance with lactulose , presented with hepatic encephalopathy - Seen by GI.  Started on Xifaxan  550 mg twice daily, indefinitely - Mentation significantly improved  Anemia, macrocytic - Hemoglobin 9.1 - Recommend PRBC transfusion for hemoglobin <7.0 - Continue to monitor CBC with differential  Diarrhea - Reports that his diarrhea has improved  Pleural effusion Hepatic hydrothorax - Status post TIPS - He was given Lasix  IV  AKI -Creatinine and BUN have remained elevated - Avoid nephrotoxic agents - Status post Lasix , no current plan to resume diuretics - Evaluated by neph  Past Medical History:  Diagnosis Date   Abnormal LFTs 09/06/2011   Acute bronchitis 08/16/2010   Anxiety 09/06/2011   Chicken pox as a child   Cirrhosis (HCC)    Cutaneous skin tags 07/27/2016   Diabetes mellitus without complication (HCC)    pt denies   Fatigue    Gout    Gout    Grief reaction 09/09/2012   Hyperglycemia 09/06/2011   Hyperlipidemia 09/06/2011   Hypertension    Insomnia 08/16/2010   Knee pain, acute 02/28/2012   Liver cirrhosis secondary to NASH (HCC)  Low testosterone  02/28/2012   Measles as a child   Mumps as a child   Nocturia 08/16/2010   Obese    Preventative health care 09/06/2011   Sleep apnea 09/06/2011   Unspecified vitamin D  deficiency 02/28/2012  :  Past Surgical History:  Procedure Laterality Date   BACK SURGERY     COLONOSCOPY  09/16/2010   Dr. Debrah: moderate diverticulosis sigmoid and descending colon, o/w  normal: repeat 10 yrs.   intestines sewn  74 yrs old   shot once made 6 holes   IR ANGIOGRAM SELECTIVE EACH ADDITIONAL VESSEL  12/14/2023   IR EMBO ARTERIAL NOT HEMORR HEMANG INC GUIDE ROADMAPPING  12/14/2023   IR PARACENTESIS  12/22/2023   IR PARACENTESIS  01/22/2024   IR RADIOLOGIST EVAL & MGMT  11/15/2023   IR THORACENTESIS RIGHT ASP PLEURAL SPACE W/IMG GUIDE  12/04/2023   IR THORACENTESIS RIGHT ASP PLEURAL SPACE W/IMG GUIDE  12/14/2023   IR TIPS  12/14/2023   IR US  GUIDE VASC ACCESS RIGHT  12/14/2023   IR US  GUIDE VASC ACCESS RIGHT  12/14/2023   TIPS PROCEDURE N/A 12/14/2023   Procedure: INSERTION, SHUNT, INTRAHEPATIC PORTOSYSTEMIC, TRANSJUGULAR;  Surgeon: Jennefer Ester PARAS, MD;  Location: [facility] OR;  Service: Radiology;  Laterality: N/A;   TONSILLECTOMY  as a child   TONSILLECTOMY     UMBILICAL HERNIA REPAIR N/A 01/30/2023   Procedure: OPEN UMBILICAL HERNIA REPAIR;  Surgeon: Belinda Cough, MD;  Location: [facility] OR;  Service: General;  Laterality: N/A;   VASECTOMY  74 yrs old  :  Allergies[1]:   Family History  Problem Relation Age of Onset   Diabetes Mother        type 2   Esophageal cancer Maternal Grandmother    Cancer Maternal Grandmother        throat   Heart disease Maternal Grandfather    Diabetes Maternal Grandfather    Depression Daughter        anxiety   Graves' disease Daughter    Colon polyps Neg Hx    Colon cancer Neg Hx    Rectal cancer Neg Hx    Stomach cancer Neg Hx   :   Social History   Socioeconomic History   Marital status: Married    Spouse name: Not on file   Number of children: 3   Years of education: Not on file   Highest education level: Master's degree (e.g., MA, MS, MEng, MEd, MSW, MBA)  Occupational History   Occupation: driver/partime  Tobacco Use   Smoking status: Former    Current packs/day: 0.00    Types: Cigarettes    Quit date: 08/30/1968    Years since quitting: 55.4   Smokeless tobacco: Never   Tobacco comments:    smoked in high  school  Vaping Use   Vaping status: Never Used  Substance and Sexual Activity   Alcohol use: Not Currently    Comment: maybe 1 beer once a month   Drug use: No   Sexual activity: Yes    Partners: Female  Other Topics Concern   Not on file  Social History Narrative   Not on file   Social Drivers of Health   Tobacco Use: Medium Risk (01/16/2024)   Patient History    Smoking Tobacco Use: Former    Smokeless Tobacco Use: Never    Passive Exposure: Not on file  Financial Resource Strain: Low Risk (11/13/2023)   Overall Financial Resource Strain (CARDIA)    Difficulty of Paying  Living Expenses: Not hard at all  Food Insecurity: No Food Insecurity (01/17/2024)   Epic    Worried About Programme Researcher, Broadcasting/film/video in the Last Year: Never true    Ran Out of Food in the Last Year: Never true  Transportation Needs: No Transportation Needs (01/17/2024)   Epic    Lack of Transportation (Medical): No    Lack of Transportation (Non-Medical): No  Physical Activity: Inactive (11/13/2023)   Exercise Vital Sign    Days of Exercise per Week: 0 days    Minutes of Exercise per Session: Not on file  Stress: No Stress Concern Present (11/13/2023)   [employer] of Occupational Health - Occupational Stress Questionnaire    Feeling of Stress: Not at all  Social Connections: Socially Integrated (01/17/2024)   Social Connection and Isolation Panel    Frequency of Communication with Friends and Family: More than three times a week    Frequency of Social Gatherings with Friends and Family: Once a week    Attends Religious Services: More than 4 times per year    Active Member of [employer] or Organizations: Yes    Attends Banker Meetings: More than 4 times per year    Marital Status: Married  Catering Manager Violence: Patient Unable To Answer (01/17/2024)   Epic    Fear of Current or Ex-Partner: Patient unable to answer    Emotionally Abused: Patient unable to answer    Physically Abused: Patient unable  to answer    Sexually Abused: Patient unable to answer  Depression (PHQ2-9): Low Risk (02/28/2023)   Depression (PHQ2-9)    PHQ-2 Score: 0  Alcohol Screen: Low Risk (11/13/2023)   Alcohol Screen    Last Alcohol Screening Score (AUDIT): 1  Housing: Low Risk (01/17/2024)   Epic    Unable to Pay for Housing in the Last Year: No    Number of Times Moved in the Last Year: 0    Homeless in the Last Year: No  Utilities: Not At Risk (01/17/2024)   Epic    Threatened with loss of utilities: No  Health Literacy: Adequate Health Literacy (02/28/2023)   B1300 Health Literacy    Frequency of need for help with medical instructions: Never  :   CURRENT MEDS: Current Facility-Administered Medications  Medication Dose Route Frequency Provider Last Rate Last Admin   albuterol  (PROVENTIL ) (2.5 MG/3ML) 0.083% nebulizer solution 3 mL  3 mL Inhalation Q6H PRN Laron Agent, RPH   3 mL at 01/19/24 2338   cefTRIAXone  (ROCEPHIN ) 2 g in sodium chloride  0.9 % 100 mL IVPB  2 g Intravenous Q24H Raenelle Donalda HERO, MD 200 mL/hr at 01/22/24 1508 2 g at 01/22/24 1508   feeding supplement (ENSURE PLUS HIGH PROTEIN) liquid 237 mL  237 mL Oral TID BM Singh, Prashant K, MD   237 mL at 01/23/24 0852   hydrALAZINE  (APRESOLINE ) injection 10 mg  10 mg Intravenous Q6H PRN Singh, Prashant K, MD       lactulose  (CHRONULAC ) 10 GM/15ML solution 10 g  10 g Oral BID Singh, Prashant K, MD   10 g at 01/23/24 0849   lidocaine  (PF) (XYLOCAINE ) 1 % injection 10 mL  10 mL Intradermal Once Ghimire, Shanker M, MD       lidocaine  (PF) (XYLOCAINE ) 1 % injection 30 mL  30 mL Intradermal Once Davenport, Kristi B, NP       midodrine  (PROAMATINE ) tablet 5 mg  5 mg Oral TID WC Singh, Prashant K, MD  5 mg at 01/23/24 1016   multivitamin with minerals tablet 1 tablet  1 tablet Oral Daily Dennise Lavada POUR, MD   1 tablet at 01/23/24 0849   octreotide  (SANDOSTATIN ) 500 mcg in sodium chloride  0.9 % 250 mL (2 mcg/mL) infusion  50 mcg/hr Intravenous  Continuous Charlanne Groom, MD 25 mL/hr at 01/23/24 1329 50 mcg/hr at 01/23/24 1329   ondansetron  (ZOFRAN ) injection 4 mg  4 mg Intravenous Q6H PRN Raenelle Donalda HERO, MD   4 mg at 01/21/24 0539   pantoprazole  (PROTONIX ) injection 40 mg  40 mg Intravenous Q12H Opyd, Evalene RAMAN, MD   40 mg at 01/23/24 0849   rifaximin  (XIFAXAN ) tablet 550 mg  550 mg Oral BID Cara Elida HERO, NP   550 mg at 01/23/24 0849    PHYSICAL EXAMINATION: ECOG PERFORMANCE STATUS: 3 - Symptomatic, >50% confined to bed  Vitals:   01/23/24 1013 01/23/24 1215  BP:  132/80  Pulse:  76  Resp:  16  Temp: (!) 97.1 F (36.2 C) 97.6 F (36.4 C)  SpO2:  96%   Filed Weights   01/16/24 1031  Weight: 231 lb 7.7 oz (105 kg)    GENERAL: alert, no distress and comfortable + chronically ill-appearing SKIN: + Jaundiced skin color, texture, turgor are normal, no rashes or significant lesions EYES: + Sclera icterus OROPHARYNX: no exudate, no erythema and lips, buccal mucosa, and tongue normal  NECK: supple, thyroid  normal size, non-tender, without nodularity LYMPH: no palpable lymphadenopathy in the cervical, axillary or inguinal LUNGS: clear to auscultation and percussion with normal breathing effort HEART: regular rate & rhythm and no murmurs and no lower extremity edema ABDOMEN: abdomen soft, non-tender and normal bowel sounds MUSCULOSKELETAL: no cyanosis of digits and no clubbing  PSYCH: + Alert and oriented x 2 NEURO: no focal motor/sensory deficits   LABS: Lab Results  Component Value Date   WBC 10.3 01/23/2024   HGB 9.1 (L) 01/23/2024   HCT 26.2 (L) 01/23/2024   MCV 104.8 (H) 01/23/2024   PLT 26 (LL) 01/23/2024    Lab Results  Component Value Date   WBC 10.3 01/23/2024   HGB 9.1 (L) 01/23/2024   HCT 26.2 (L) 01/23/2024   PLT 26 (LL) 01/23/2024   GLUCOSE 139 (H) 01/23/2024   CHOL 143 03/28/2023   TRIG 127.0 03/28/2023   HDL 46.20 03/28/2023   LDLDIRECT 110.0 12/09/2020   LDLCALC 71 03/28/2023    ALT 11 01/23/2024   AST 26 01/23/2024   NA 142 01/23/2024   K 4.0 01/23/2024   CL 107 01/23/2024   CREATININE 1.87 (H) 01/23/2024   BUN 68 (H) 01/23/2024   CO2 25 01/23/2024   PSA 0.38 03/08/2023   INR 2.3 (H) 01/23/2024   HGBA1C 4.9 12/14/2023   MICROALBUR 2.1 (H) 03/08/2023    US  ABD LTD RUG W/LIVER DOPPLER Result Date: 01/22/2024 EXAM: US  Duplex Arterial/Venous of the Abdomen, Complete. TECHNIQUE: Real-time duplex ultrasound scan of the arterial and venous flow of the pelvis with color Doppler flow and spectral waveform analysis. COMPARISON: US  Hepatic liver 01/16/2024. CLINICAL HISTORY: S/P TIPS (transjugular intrahepatic portosystemic shunt). Post-TIPS creation 12/14/2023. FINDINGS: PORTAL VEINS: Normal velocity. Hepatopetal flow. Antegrade flow in the main portal vein. No definite varices were identified. HEPATIC ARTERY: Normal flow. HEPATIC VEINS: Continuous hepatic flow in right and middle hepatic veins. SPLENIC VEIN: Normal flow. TIPS: No definite color flow signal in the TIPS. Nondirectional monophasic flow signal is recorded in the region of the TIPS by the sonographer. LIVER:  Nodular hepatic contour. GALLBLADDER: Gallbladder wall thickness 2.9 mm. BILE DUCTS: CBD diameter of 3.8 mm. ABDOMEN: Small volume abdominal ascites. LIMITATIONS/ARTIFACTS: Technically difficult study secondary to body habitus. IMPRESSION: 1. No color flow within the TIPS, with monophasic nondirectional flow in the region of the TIPS, concerning for TIPS dysfunction or occlusion. 2. Small volume abdominal ascites. Electronically signed by: Katheleen Faes MD MD 01/22/2024 02:04 PM EST RP Workstation: [id number]   IR Paracentesis Result Date: 01/22/2024 INDICATION: 74 year old male with history of MASH and cirrhosis with ascites. History of TIPS procedure performed on 12/14/2023. Received request for diagnostic paracentesis only. EXAM: ULTRASOUND GUIDED DIAGNOSTIC, LEFT-SIDED PARACENTESIS MEDICATIONS: 6 mL 1% lidocaine   with epinephrine  COMPLICATIONS: None immediate. PROCEDURE: Informed written consent was obtained from the patient after a discussion of the risks, benefits and alternatives to treatment. A timeout was performed prior to the initiation of the procedure. Initial ultrasound scanning demonstrates a small amount of ascites within the right lower abdominal quadrant. The right lower abdomen was prepped and draped in the usual sterile fashion. 1% lidocaine  with epinephrine  was used for local anesthesia. Following this, a 19 gauge, 7-cm, Yueh catheter was introduced. An ultrasound image was saved for documentation purposes. The paracentesis was performed. The catheter was removed and a dressing was applied. The patient tolerated the procedure well without immediate post procedural complication. FINDINGS: A total of approximately 100 mL of cloudy amber fluid was removed. Samples were sent to the laboratory as requested by the clinical team. IMPRESSION: Successful ultrasound-guided paracentesis yielding 100 mL of peritoneal fluid. Performed by: Rayfield Buff, NP under the supervision of Dr. Cordella Banner Electronically Signed   By: Cordella Banner   On: 01/22/2024 12:24   US  Paracentesis Result Date: 01/20/2024 INDICATION: 74 year old male. History of cirrhosis status post tips. Found to have ascites. Request for therapeutic and diagnostic paracentesis. 3 L maximum EXAM: ULTRASOUND GUIDED THERAPEUTIC AND DIAGNOSTIC PARACENTESIS MEDICATIONS: Lidocaine  1% 10 mL COMPLICATIONS: None immediate. PROCEDURE: Informed written consent was obtained from the patient after a discussion of the risks, benefits and alternatives to treatment. A timeout was performed prior to the initiation of the procedure. Initial ultrasound scanning demonstrates a moderate amount of ascites within the right lower abdominal quadrant. The right lower abdomen was prepped and draped in the usual sterile fashion. 1% lidocaine  was used for local  anesthesia. Following this, a 19 gauge, 7-cm, Yueh catheter was introduced. An ultrasound image was saved for documentation purposes. The paracentesis was performed. The catheter was removed and a dressing was applied. The patient tolerated the procedure well without immediate post procedural complication. FINDINGS: A total of approximately 3 liters of straw-colored fluid was removed. Samples were sent to the laboratory as requested by the clinical team. IMPRESSION: Successful ultrasound-guided paracentesis yielding 3 liters of straw-colored peritoneal fluid. Performed by Delon Beagle NP Electronically Signed   By: Wilkie Lent M.D.   On: 01/20/2024 12:33   DG CHEST PORT 1 VIEW Result Date: 01/18/2024 EXAM: 1 VIEW(S) XRAY OF THE CHEST 01/18/2024 04:14:00 PM COMPARISON: 01/16/2024 CLINICAL HISTORY: Labored breathing FINDINGS: LUNGS AND PLEURA: Persistent large right pleural effusion with associated hazy opacity throughout the right lung, likely reflecting compressive atelectasis. No pneumothorax. HEART AND MEDIASTINUM: Aortic atherosclerosis. No acute abnormality of the cardiac and mediastinal silhouettes. BONES AND SOFT TISSUES: No acute osseous abnormality. IMPRESSION: 1. Persistent large right pleural effusion with associated hazy opacity throughout the right lung, likely reflecting compressive atelectasis. Electronically signed by: Greig Pique MD MD 01/18/2024 07:54 PM EST  RP Workstation: [id number]   US  RENAL Result Date: 01/18/2024 CLINICAL DATA:  Acute kidney injury EXAM: RENAL / URINARY TRACT ULTRASOUND COMPLETE COMPARISON:  None Available. FINDINGS: Right Kidney: Renal measurements: 9.6 cm x 5.3 cm x 5.3 cm = volume: 139.9 mL. Echogenicity within normal limits. No mass or hydronephrosis visualized. Left Kidney: Renal measurements: 10.9 cm x 6.1 cm x 6.0 cm = volume: 210.9 mL. Echogenicity within normal limits. No mass or hydronephrosis visualized. Bladder: Appears normal for degree of bladder  distention. Other: Of incidental note is the presence of ascites throughout the abdomen. The study is technically limited secondary to the patient's body habitus and lack of patient cooperation, as per the ultrasound technologist. IMPRESSION: 1. Ascites. 2. Normal ultrasonographic appearance of the kidneys. Electronically Signed   By: Suzen Dials M.D.   On: 01/18/2024 14:53   IR ABDOMEN US  LIMITED Result Date: 01/17/2024 INDICATION: 74 year old male with history of MASH and cirrhosis with ascites. History of TIPS procedure performed on 12/14/2023. Received request for diagnostic and therapeutic paracentesis. EXAM: ULTRASOUND ABDOMEN LIMITED FINDINGS: All four abdominal quadrants were examined. There was no significant pocket of fluid to allow for safe approach for paracentesis. IMPRESSION: All four abdominal quadrants were examined. There was no significant pocket of fluid to allow for safe approach for paracentesis. No procedure performed. Read by: Rayfield Buff, NP under the supervision of Dr. Cordella Banner Electronically Signed   By: Cordella Banner   On: 01/17/2024 13:37   US  LIVER DOPPLER Result Date: 01/16/2024 EXAM: Ultrasound Hepatic Liver Doppler 01/16/2024 04:45:53 PM TECHNIQUE: Real-time ultrasonography of the right upper quadrant of the abdomen was performed. COMPARISON: US  Abdomen 01/16/2024 03:14 PM. CLINICAL HISTORY: Ascites. FINDINGS: LIVER: Changes of hepatic cirrhosis with diffuse hepatic parenchymal atrophy and nodular contour. No focal lesions are identified. No intrahepatic biliary ductal dilatation. Varices are absent. BILIARY SYSTEM: Gallbladder wall thickness measures 5.5 mm. No pericholecystic fluid. No cholelithiasis. Negative sonographic Murphy's sign. The common bile duct measures 2.7 mm. VASCULAR: Portal vein:   Unable to visualize due to technical factors. Tips stent: No flow is seen in the tip of the stent on color flow doppler images. Flow is demonstrated with spectral  doppler . Low amplitude venous waveforms are obtained. Proximal at portal, 36.8 cm per second. Mid, 30.6 cm per second Distal at hepatic vein, 57.2 cm per second. . IVC is patent. Hepatic veins: Right, 20.17 cm per second, hepatofugal flow. Mid, 45.6 cm per second, hepatofugal flow. Left, unable to visualize due to technical factors. Splenic vein, 34.3 cm per second. Superior mesenteric vein, unable to visualize due to technical factors. Hepatic artery, patent with normal arterial waveform. Aorta: unable to visualize due to bowel gas. OTHER: Moderate ascites is present. Small right pleural effusion. LIMITATIONS/ARTIFACTS: Examination is technically limited due to patient unable to hold breath and heavy breathing during examination resulting in motion artifact. Overlying bowel gas obscures some areas. IMPRESSION: 1. Moderate ascites. 2. Changes of hepatic cirrhosis with diffuse hepatic parenchymal atrophy and nodular contour, without focal hepatic lesion. 3. TIPS stent is patent, with low flow venous waveform obtained. Doppler evaluation limited by technical factors. 4. Small right pleural effusion, and no sonographic evidence of varices. Electronically signed by: Elsie Gravely MD 01/16/2024 05:31 PM EST RP Workstation: [id number]   US  ASCITES (ABDOMEN LIMITED) Result Date: 01/16/2024 EXAM: LIMITED ABDOMINAL ULTRASOUND FOR ASCITES EVALUATION TECHNIQUE: Limited real-time sonography of all 4 quadrants of the abdomen was performed for evaluation of ascites. COMPARISON: US  Abdomen 06/14/2023;  01/18/2023. CLINICAL HISTORY: Ascites. FINDINGS: RIGHT UPPER QUADRANT: Large volume ascites seen. LEFT UPPER QUADRANT: Large volume ascites seen. RIGHT LOWER QUADRANT: Large volume ascites seen. LEFT LOWER QUADRANT: Large volume ascites seen. OTHER: Limited visualization of the rest of the abdomen demonstrates no acute abnormality. IMPRESSION: 1. Large volume ascites Electronically signed by: Rogelia Myers MD 01/16/2024 04:54  PM EST RP Workstation: [id number]   CT HEAD WO CONTRAST Result Date: 01/16/2024 EXAM: CT HEAD WITHOUT 01/16/2024 11:25:51 AM TECHNIQUE: CT of the head was performed without the administration of intravenous contrast. Automated exposure control, iterative reconstruction, and/or weight based adjustment of the mA/kV was utilized to reduce the radiation dose to as low as reasonably achievable. COMPARISON: None available. CLINICAL HISTORY: 74 year old male with cirrhosis, altered mental status, and confusion for 2 days. FINDINGS: BRAIN AND VENTRICLES: No acute intracranial hemorrhage. No mass effect or midline shift. No extra-axial fluid collection. No evidence of acute infarct. No hydrocephalus. Brain volume within normal limits for age. Small but circumscribed chronic appearing infarct in the central right cerebellum on series 2 image 9. Elsewhere gray white differentiation is within normal limits for age. No suspicious intracranial vascular hyperdensity. No acute traumatic injury identified. ORBITS: No acute abnormality. SINUSES AND MASTOIDS: Incidental right maxillary alveolar recess retention cyst. Paranasal sinuses, tympanic cavities and mastoids are well aerated. SOFT TISSUES AND SKULL: No acute skull fracture. No acute soft tissue abnormality. Calcified atherosclerosis at the skull base. IMPRESSION: 1. No acute intracranial abnormality. Chronic appearing small right cerebellar infarct. Electronically signed by: Helayne Hurst MD 01/16/2024 11:57 AM EST RP Workstation: [id number]   DG Chest Portable 1 View Result Date: 01/16/2024 CLINICAL DATA:  Shortness of breath.  Altered mental status. EXAM: PORTABLE CHEST 1 VIEW COMPARISON:  01/05/2024 FINDINGS: Lungs are hypoinflated demonstrate interval worsening hazy opacification over the mid to lower right lung suggesting moderate size effusion likely with associated compressive atelectasis in the right base. Patient is slightly rotated to the left. Left lung is  otherwise clear. Cardiomediastinal silhouette and remainder of the exam is unchanged. IMPRESSION: Interval worsening hazy opacification over the mid to lower right lung suggesting moderate size effusion likely with associated compressive atelectasis in the right base. Electronically Signed   By: Toribio Agreste M.D.   On: 01/16/2024 10:54   US  Paracentesis Result Date: 01/08/2024 INDICATION: Patient with history of decompensated NASH cirrhosis, recurrent right hepatic hydrothorax, ascites, recent TIPS on 12/14/2023; presents today for therapeutic paracentesis. EXAM: ULTRASOUND GUIDED THERAPEUTIC PARACENTESIS MEDICATIONS: 8 mL 1% lidocaine  to skin/subcutaneous tissue COMPLICATIONS: None immediate. PROCEDURE: Informed written consent was obtained from the patient after a discussion of the risks, benefits and alternatives to treatment. A timeout was performed prior to the initiation of the procedure. Initial ultrasound scanning demonstrates a large amount of ascites within the right lower abdominal quadrant. The right lower abdomen was prepped and draped in the usual sterile fashion. 1% lidocaine  was used for local anesthesia. Following this, a 6 Fr Safe-T-Centesis catheter was introduced. An ultrasound image was saved for documentation purposes. The paracentesis was performed. The catheter was removed and a dressing was applied. The patient tolerated the procedure well without immediate post procedural complication. FINDINGS: A total of approximately 2 liters of yellow fluid was removed. Due to patient hypotension only the above amount of fluid was removed today. IMPRESSION: Successful ultrasound-guided paracentesis yielding 2 liters of peritoneal fluid. Performed by: Franky Rakers, PA-C Electronically Signed   By: Juliene Balder M.D.   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 PROCEDURE: An ultrasound guided thoracentesis was thoroughly discussed with the patient and questions answered. The benefits, risks, alternatives and complications were also discussed. The patient understands and wishes to proceed with the procedure. Written consent was obtained. Ultrasound was performed to localize and mark an adequate pocket of fluid in the right chest. The area was then prepped and draped in the normal sterile fashion. 1% Lidocaine  was used for local anesthesia. Under ultrasound guidance a 6 Fr Safe-T-Centesis catheter was introduced. Thoracentesis was performed. The catheter was removed and a dressing applied. FINDINGS: A total of approximately 2.4 liters of yellow fluid was removed. Due to patient coughing only the above amount of fluid was removed today. IMPRESSION: Successful ultrasound guided therapeutic right thoracentesis yielding 2.4 liters of pleural fluid. Performed by: Franky Rakers, PA-C Electronically Signed   By: Cordella Banner   On: 01/05/2024 15:54   DG Chest 1 View Result Date: 01/05/2024 CLINICAL DATA:  Status post thoracentesis EXAM: CHEST  1 VIEW COMPARISON:  December 15, 2023. FINDINGS: The heart size and mediastinal contours are within normal limits. Right pleural effusion is significantly smaller status post thoracentesis. No definite pneumothorax is noted. The visualized skeletal structures are unremarkable. IMPRESSION: Right pleural effusion is significantly smaller status post thoracentesis. No definite pneumothorax is noted. Electronically Signed   By: Lynwood Landy Raddle M.D.   On: 01/05/2024 15:44     I personally spent a total of 55 minutes minutes in the care of the patient today including  preparing to see the patient, getting/reviewing separately obtained history, performing a medically appropriate exam/evaluation, counseling and educating, referring and communicating with other health care professionals, documenting clinical information in the EHR, communicating results, and coordinating care.    All questions were answered. The patient knows to call the clinic with any problems, questions or concerns. No barriers to learning was detected.  Thank you for the courtesy of this consultation, Olam JINNY Brunner, NP  1/13/20261:39 PM    Attending Note  I personally saw the patient, reviewed the chart and examined the patient. The plan of care was discussed with the patient and the admitting team. I agree with the assessment and plan as documented above.  More than likely the thrombocytopenia is because of the acute illness. Baseline is 65-70 K I added additional labs to rule out any component of DIC, hemolysis and nutritional deficiency. He apparently is clinically improving so unlikely any of these but sub acute DIC is still possible. We will continue to monitor him.          [1] No Known Allergies  "

## 2024-01-23 NOTE — TOC Progression Note (Signed)
 Transition of Care [facility]) - Progression Note    Patient Details  Name: David Esparza MRN: [id number] Date of Birth: 01-Jun-1950  Transition of Care [facility]) CM/SW Contact  Landry DELENA Senters, RN Phone Number: 01/23/2024, 3:11 PM  Clinical Narrative:     CM spoke with wife today regarding HH therapy rec. She does want this service, no preference on agency.   Referral to be sent in hub.  DME rec for RW and BSC. Wife does want these items. CM will order when closer to d/c time.   CM to continue to follow.                   Expected Discharge Plan and Services                                               Social Drivers of Health (SDOH) Interventions SDOH Screenings   Food Insecurity: No Food Insecurity (01/17/2024)  Housing: Low Risk (01/17/2024)  Transportation Needs: No Transportation Needs (01/17/2024)  Utilities: Not At Risk (01/17/2024)  Alcohol Screen: Low Risk (11/13/2023)  Depression (PHQ2-9): Low Risk (02/28/2023)  Financial Resource Strain: Low Risk (11/13/2023)  Physical Activity: Inactive (11/13/2023)  Social Connections: Socially Integrated (01/17/2024)  Stress: No Stress Concern Present (11/13/2023)  Tobacco Use: Medium Risk (01/16/2024)  Health Literacy: Adequate Health Literacy (02/28/2023)    Readmission Risk Interventions    12/16/2023   11:52 AM  Readmission Risk Prevention Plan  Post Dischage Appt Complete  Medication Screening Complete  Transportation Screening Complete

## 2024-01-23 NOTE — Progress Notes (Signed)
 Admit: 01/16/2024 LOS: 7  19M with advanced cirrhosis including large volume ascites status post TIPS 12/2023, recurrent encephalopathy, history of varices who has a stuttering AKI.   Subjective:  Feels better, no complaints Children at bedside, updated on status 1.3 L urine output documented yesterday Creatinine stable at 1.7, BUN 68, K4.0  01/12 0701 - 01/13 0700 In: -  Out: 1275 [Urine:1275]  Filed Weights   01/16/24 1031  Weight: 105 kg    Scheduled Meds:  feeding supplement  237 mL Oral TID BM   lactulose   10 g Oral BID   lidocaine  (PF)  10 mL Intradermal Once   lidocaine  (PF)  30 mL Intradermal Once   midodrine   5 mg Oral TID WC   multivitamin with minerals  1 tablet Oral Daily   pantoprazole  (PROTONIX ) IV  40 mg Intravenous Q12H   rifaximin   550 mg Oral BID   Continuous Infusions:  cefTRIAXone  (ROCEPHIN )  IV 2 g (01/22/24 1508)   octreotide  (SANDOSTATIN ) 500 mcg in sodium chloride  0.9 % 250 mL (2 mcg/mL) infusion 50 mcg/hr (01/23/24 0000)   PRN Meds:.albuterol , hydrALAZINE , ondansetron  (ZOFRAN ) IV  Current Labs: reviewed    Physical Exam:  Blood pressure 132/80, pulse 76, temperature 97.6 F (36.4 C), temperature source Oral, resp. rate 16, height 5' 7 (1.702 m), weight 105 kg, SpO2 96%. GEN: Elderly male, mildly confused ENT: NCAT EYES: EOMI CV: Regular, no rub PULM: Diminished in the bases ABD: Protuberant, soft SKIN: No petechia or purpura EXT: No significant edema  A AKI with normal baseline GFR: Likely multifactorial and included presentation with component of HRS.  Has improved with supportive care and now worsening again potentially because of changes in volume status.    Imaging has been reassuring.  Would not resume any diuretics at the current time.  Urinalysis today without hematuria, pyuria, proteinuria.  Blood pressures are stable.I think at this point continue supportive care --overall stable.  Continue current plan. Decompensated cirrhosis  presenting with encephalopathy, having GI bleed: Gastroenterology is following.  On octreotide .  Requiring midodrine . Upper GI bleed on octreotide  with GI following  P No diuretics, no IV fluids at this time Continue supportive care Daily weights, Daily Renal Panel, Strict I/Os, Avoid nephrotoxins (NSAIDs, judicious IV Contrast)  Medication Issues; Preferred narcotic agents for pain control are hydromorphone, fentanyl , and methadone. Morphine  should not be used.  Baclofen should be avoided Avoid oral sodium phosphate  and magnesium citrate based laxatives / bowel preps    Bernardino Gasman MD 01/23/2024, 1:05 PM  Recent Labs  Lab 01/21/24 0348 01/22/24 0740 01/23/24 0309  NA 145 146* 142  K 4.6 3.9 4.0  CL 109 109 107  CO2 23 24 25   GLUCOSE 148* 159* 139*  BUN 57* 75* 68*  CREATININE 1.71* 1.90* 1.87*  CALCIUM  8.5* 8.5* 8.4*   Recent Labs  Lab 01/21/24 1155 01/21/24 2117 01/22/24 1232 01/23/24 0309  WBC 9.0 10.9* 11.3* 10.3  NEUTROABS 8.1* 9.5* 9.8*  --   HGB 8.6* 8.3* 8.8* 9.1*  HCT 25.0* 24.4* 25.4* 26.2*  MCV 105.9* 105.2* 105.4* 104.8*  PLT 38* 47* 44* 26*

## 2024-01-23 NOTE — Plan of Care (Signed)
   Problem: Nutrition: Goal: Adequate nutrition will be maintained Outcome: Progressing   Problem: Pain Managment: Goal: General experience of comfort will improve and/or be controlled Outcome: Progressing   Problem: Safety: Goal: Ability to remain free from injury will improve Outcome: Progressing
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
## 2024-01-24 ENCOUNTER — Inpatient Hospital Stay (HOSPITAL_COMMUNITY)

## 2024-01-24 ENCOUNTER — Telehealth: Payer: Self-pay | Admitting: Family Medicine

## 2024-01-24 DIAGNOSIS — K7469 Other cirrhosis of liver: Secondary | ICD-10-CM | POA: Diagnosis not present

## 2024-01-24 DIAGNOSIS — K7682 Hepatic encephalopathy: Secondary | ICD-10-CM | POA: Diagnosis not present

## 2024-01-24 DIAGNOSIS — K764 Peliosis hepatis: Secondary | ICD-10-CM | POA: Diagnosis not present

## 2024-01-24 DIAGNOSIS — R188 Other ascites: Secondary | ICD-10-CM | POA: Diagnosis not present

## 2024-01-24 HISTORY — PX: IR PARACENTESIS: IMG2679

## 2024-01-24 HISTORY — PX: IR US GUIDE VASC ACCESS RIGHT: IMG2390

## 2024-01-24 HISTORY — PX: IR TIPS REVISION MOD SED: IMG2296

## 2024-01-24 LAB — COMPREHENSIVE METABOLIC PANEL WITH GFR
ALT: 10 U/L (ref 0–44)
AST: 18 U/L (ref 15–41)
Albumin: 3.4 g/dL — ABNORMAL LOW (ref 3.5–5.0)
Alkaline Phosphatase: 59 U/L (ref 38–126)
Anion gap: 10 (ref 5–15)
BUN: 57 mg/dL — ABNORMAL HIGH (ref 8–23)
CO2: 24 mmol/L (ref 22–32)
Calcium: 8.3 mg/dL — ABNORMAL LOW (ref 8.9–10.3)
Chloride: 106 mmol/L (ref 98–111)
Creatinine, Ser: 1.69 mg/dL — ABNORMAL HIGH (ref 0.61–1.24)
GFR, Estimated: 42 mL/min — ABNORMAL LOW
Glucose, Bld: 155 mg/dL — ABNORMAL HIGH (ref 70–99)
Potassium: 3.6 mmol/L (ref 3.5–5.1)
Sodium: 140 mmol/L (ref 135–145)
Total Bilirubin: 2 mg/dL — ABNORMAL HIGH (ref 0.0–1.2)
Total Protein: 5.7 g/dL — ABNORMAL LOW (ref 6.5–8.1)

## 2024-01-24 LAB — CBC WITH DIFFERENTIAL/PLATELET
Abs Immature Granulocytes: 0.16 K/uL — ABNORMAL HIGH (ref 0.00–0.07)
Basophils Absolute: 0 K/uL (ref 0.0–0.1)
Basophils Relative: 0 %
Eosinophils Absolute: 0.3 K/uL (ref 0.0–0.5)
Eosinophils Relative: 2 %
HCT: 24.6 % — ABNORMAL LOW (ref 39.0–52.0)
Hemoglobin: 8.6 g/dL — ABNORMAL LOW (ref 13.0–17.0)
Immature Granulocytes: 1 %
Lymphocytes Relative: 3 %
Lymphs Abs: 0.4 K/uL — ABNORMAL LOW (ref 0.7–4.0)
MCH: 36.9 pg — ABNORMAL HIGH (ref 26.0–34.0)
MCHC: 35 g/dL (ref 30.0–36.0)
MCV: 105.6 fL — ABNORMAL HIGH (ref 80.0–100.0)
Monocytes Absolute: 1 K/uL (ref 0.1–1.0)
Monocytes Relative: 7 %
Neutro Abs: 11.7 K/uL — ABNORMAL HIGH (ref 1.7–7.7)
Neutrophils Relative %: 87 %
Platelets: 46 K/uL — ABNORMAL LOW (ref 150–400)
RBC: 2.33 MIL/uL — ABNORMAL LOW (ref 4.22–5.81)
RDW: 16 % — ABNORMAL HIGH (ref 11.5–15.5)
WBC: 13.5 K/uL — ABNORMAL HIGH (ref 4.0–10.5)
nRBC: 0.4 % — ABNORMAL HIGH (ref 0.0–0.2)

## 2024-01-24 LAB — BODY FLUID CELL COUNT WITH DIFFERENTIAL
Eos, Fluid: 0 %
Lymphs, Fluid: 24 %
Monocyte-Macrophage-Serous Fluid: 40 % — ABNORMAL LOW (ref 50–90)
Neutrophil Count, Fluid: 36 % — ABNORMAL HIGH (ref 0–25)
Total Nucleated Cell Count, Fluid: 479 uL (ref 0–1000)

## 2024-01-24 LAB — CBC
HCT: 24.5 % — ABNORMAL LOW (ref 39.0–52.0)
Hemoglobin: 8.4 g/dL — ABNORMAL LOW (ref 13.0–17.0)
MCH: 36.1 pg — ABNORMAL HIGH (ref 26.0–34.0)
MCHC: 34.3 g/dL (ref 30.0–36.0)
MCV: 105.2 fL — ABNORMAL HIGH (ref 80.0–100.0)
Platelets: 56 K/uL — ABNORMAL LOW (ref 150–400)
RBC: 2.33 MIL/uL — ABNORMAL LOW (ref 4.22–5.81)
RDW: 16.4 % — ABNORMAL HIGH (ref 11.5–15.5)
WBC: 16.4 K/uL — ABNORMAL HIGH (ref 4.0–10.5)
nRBC: 0.1 % (ref 0.0–0.2)

## 2024-01-24 LAB — MAGNESIUM: Magnesium: 2.3 mg/dL (ref 1.7–2.4)

## 2024-01-24 LAB — PROTIME-INR
INR: 1.9 — ABNORMAL HIGH (ref 0.8–1.2)
Prothrombin Time: 23 s — ABNORMAL HIGH (ref 11.4–15.2)

## 2024-01-24 LAB — AMMONIA: Ammonia: 38 umol/L — ABNORMAL HIGH (ref 9–35)

## 2024-01-24 MED ORDER — LACTULOSE 10 GM/15ML PO SOLN
20.0000 g | Freq: Two times a day (BID) | ORAL | Status: DC
  Administered 2024-01-24 – 2024-01-25 (×3): 20 g via ORAL
  Filled 2024-01-24 (×3): qty 30

## 2024-01-24 MED ORDER — MIDODRINE HCL 5 MG PO TABS
10.0000 mg | ORAL_TABLET | Freq: Three times a day (TID) | ORAL | Status: DC
  Administered 2024-01-24 – 2024-01-25 (×6): 10 mg via ORAL
  Filled 2024-01-24 (×6): qty 2

## 2024-01-24 MED ORDER — TRAMADOL HCL 50 MG PO TABS
50.0000 mg | ORAL_TABLET | Freq: Four times a day (QID) | ORAL | Status: DC | PRN
  Administered 2024-01-24 – 2024-01-25 (×2): 50 mg via ORAL
  Filled 2024-01-24 (×2): qty 1

## 2024-01-24 MED ORDER — FENTANYL CITRATE (PF) 100 MCG/2ML IJ SOLN
INTRAMUSCULAR | Status: AC
  Filled 2024-01-24: qty 2

## 2024-01-24 MED ORDER — MIDAZOLAM HCL (PF) 2 MG/2ML IJ SOLN
INTRAMUSCULAR | Status: AC | PRN
  Administered 2024-01-24 (×3): .5 mg via INTRAVENOUS

## 2024-01-24 MED ORDER — LIDOCAINE-EPINEPHRINE 1 %-1:100000 IJ SOLN
20.0000 mL | Freq: Once | INTRAMUSCULAR | Status: AC
  Administered 2024-01-24: 20 mL via INTRADERMAL

## 2024-01-24 MED ORDER — MIDAZOLAM HCL 2 MG/2ML IJ SOLN
INTRAMUSCULAR | Status: AC
  Filled 2024-01-24: qty 2

## 2024-01-24 MED ORDER — IOHEXOL 300 MG/ML  SOLN
150.0000 mL | Freq: Once | INTRAMUSCULAR | Status: AC | PRN
  Administered 2024-01-24: 60 mL via INTRAVENOUS

## 2024-01-24 MED ORDER — LIDOCAINE-EPINEPHRINE 1 %-1:100000 IJ SOLN
INTRAMUSCULAR | Status: AC
  Filled 2024-01-24: qty 1

## 2024-01-24 MED ORDER — FENTANYL CITRATE (PF) 100 MCG/2ML IJ SOLN
INTRAMUSCULAR | Status: AC | PRN
  Administered 2024-01-24 (×2): 25 ug via INTRAVENOUS

## 2024-01-24 MED ORDER — LACTULOSE 10 GM/15ML PO SOLN: 20.0000 g | Freq: Three times a day (TID) | ORAL | Status: DC

## 2024-01-24 MED ORDER — IOHEXOL 350 MG/ML SOLN
100.0000 mL | Freq: Once | INTRAVENOUS | Status: AC | PRN
  Administered 2024-01-24: 100 mL via INTRAVENOUS

## 2024-01-24 NOTE — Telephone Encounter (Signed)
 Copied from CRM [id number]. Topic: General - Other >> Jan 24, 2024  2:27 PM Drema MATSU wrote: Reason for CRM: Patient wife is requesting a handicap sticker for husband. If no answer to please leave a voicemail.

## 2024-01-24 NOTE — Progress Notes (Signed)
 "                        PROGRESS NOTE        PATIENT DETAILS Name: David Esparza Age: 74 y.o. Sex: male Date of Birth: 1950/08/10 Admit Date: 01/16/2024 Admitting Physician Sigurd Pac, MD ERE:Aobuy, Harlene LABOR, MD  Brief Summary:  74 year old with cirrhosis-recent TIPS-noncompliance with lactulose -presented with hepatic encephalopathy and AKI.   Significant events:  1/6>>admit to [facility]  Significant studies:  1/6>> CXR: Interval worsening/opacification over mid/right lower lung suggestive of moderate-sized pleural effusion. 1/6>> CT head: No acute intracranial abnormality 1/6>> US  ascites: Large volume ascites 1/6>> US  liver Doppler: Moderate ascites, TIPS stent in place, hepatic cirrhosis. 1/7>> IR abdomen US  limited: No significant pocket for paracentesis 1/8>> US  renal: No hydronephrosis 1/8>> CXR: Persistent large right pleural effusion. 1/12.  Repeat abdominal ultrasound 1. No color flow within the TIPS, with monophasic nondirectional flow in the region of the TIPS, concerning for TIPS dysfunction or occlusion. 2. Small volume abdominal ascites 1/12.  Repeat paracentesis with 100 cc of fluid removed  Significant microbiology data: None  Procedures: None  Consults: GI, IR  Subjective: Patient in bed, appears comfortable, denies any headache, no fever, no chest pain or pressure, no shortness of breath , no abdominal pain. No focal weakness.  Objective:  Vitals: Blood pressure 118/67, pulse 79, temperature 98.1 F (36.7 C), temperature source Oral, resp. rate 18, height 5' 7 (1.702 m), weight 105 kg, SpO2 100%.   Exam:  Awake Alert, No new F.N deficits, Normal affect [place].AT,PERRAL Supple Neck, No JVD,   Symmetrical Chest wall movement, Good air movement bilaterally, CTAB RRR,No Gallops, Rubs or new Murmurs,  +ve B.Sounds, Abd Soft, No tenderness,   No Cyanosis, Clubbing or edema    Assessment/Plan:  Hepatic encephalopathy Recent TIPS-noncompliant/poorly  compliant with lactulose  Continues to improve Continue lactulose /rifaximin , dose adjusted on 01/22/2024 still having profuse diarrhea causing dehydration, mentation has improved and he is now at his baseline.   AKI, creatinine of 2.6 at time of admission Likely hemodynamically mediated-in the setting of poor oral intake due to hepatic encephalopathy-ongoing lisinopril  use. Gradual improvement with IV albumin  and initial IV fluids, continue to monitor.  Nephrology on board as well managing diuretics and fluid status.  Hypernatremia.  Resolved after gentle D5W on 01/22/2024 and discontinuation of Lasix  temporarily.  Hepatic hydrothorax and ascites with SBP Not very symptomatic On room air this morning Lasix  and albumin  as blood pressure and electrolytes can tolerate Underwent diagnostic and therapeutic paracentesis on 01/20/2023 with 3-1/2 L of fluid removed, fluid also suggestive of SBP.   NASH liver cirrhosis, portal hypertension, recent TIPS 01/04/2024.  SBP now. Thrombocytopenia likely secondary to hypersplenism Underwent paracentesis fluid suggestive of SBP, on appropriate antibiotics, follow cultures. Note thrombocytopenia getting worse, will get hematology input on 01/23/2024, currently not bleeding.  Type screen is done.  Continue to monitor  TIPS occlusion noted on ultrasound 01/22/2024.  GI and IR managing, due for TIPS reversal on 01/24/2024 by IR.  Defer management to GI and IR.   Dark emesis x 2 01/20/2024.  With history of NASH and portal hypertension has been placed on IV PPI and octreotide , type screen done GI following, monitor CBC.  Patient and family agreeable for transfusion if needed.     Hypertension.  Blood pressure soft again, low-dose midodrine .  And monitor.  Class 2 Obesity: Estimated body mass index is 36.26 kg/m as calculated from the following:  Height as of this encounter: 5' 7 (1.702 m).   Weight as of this encounter: 105 kg.   Code status:   Code Status:  Full Code   DVT Prophylaxis: SCDs Start: 01/17/24 0147   Family Communication: Spouse-Sharon and patient's daughter updated via room phone x 2 on 01/21/2024.  Spouse called on cell phone [phone] on 01/22/2024 at 8:50 AM and updated, updated again 01/23/2024   Disposition Plan: Status is: Inpatient Remains inpatient appropriate because: Severity of illness   Planned Discharge Destination:Home   Diet: Diet Order             Diet NPO time specified Except for: Sips with Meds  Diet effective midnight                     Antimicrobial agents: Anti-infectives (From admission, onward)    Start     Dose/Rate Route Frequency Ordered Stop   01/20/24 1545  cefTRIAXone  (ROCEPHIN ) 2 g in sodium chloride  0.9 % 100 mL IVPB        2 g 200 mL/hr over 30 Minutes Intravenous Every 24 hours 01/20/24 1456     01/20/24 1530  cefoTAXime (CLAFORAN) 2 g in dextrose  5 % 50 mL IVPB  Status:  Discontinued        2 g 140 mL/hr over 30 Minutes Intravenous Every 12 hours 01/20/24 1431 01/20/24 1455   01/16/24 1445  rifaximin  (XIFAXAN ) tablet 550 mg        550 mg Oral 2 times daily 01/16/24 1440     01/16/24 1430  cefTRIAXone  (ROCEPHIN ) 2 g in sodium chloride  0.9 % 100 mL IVPB  Status:  Discontinued        2 g 200 mL/hr over 30 Minutes Intravenous Every 24 hours 01/16/24 1420 01/16/24 1449       MEDICATIONS: Scheduled Meds:  feeding supplement  237 mL Oral TID BM   lactulose   20 g Oral BID   lidocaine  (PF)  10 mL Intradermal Once   lidocaine  (PF)  30 mL Intradermal Once   midodrine   10 mg Oral TID WC   multivitamin with minerals  1 tablet Oral Daily   pantoprazole  (PROTONIX ) IV  40 mg Intravenous Q12H   rifaximin   550 mg Oral BID   Continuous Infusions:  cefTRIAXone  (ROCEPHIN )  IV 2 g (01/23/24 1630)   PRN Meds:.albuterol , fentaNYL , hydrALAZINE , midazolam  PF, ondansetron  (ZOFRAN ) IV   I have personally reviewed following labs and imaging studies  LABORATORY DATA:   Data Review:    Patient Lines/Drains/Airways Status     Active Line/Drains/Airways     Name Placement date Placement time Site Days   Peripheral IV 01/20/24 22 G 1 Left;Posterior Hand 01/20/24  2150  Hand  4   Peripheral IV 01/21/24 20 G 1.75 Anterior;Distal;Left;Upper Arm 01/21/24  1317  Arm  3   Wound 01/24/24 0945 Other (Comment) Neck Right 01/24/24  0945  Neck  less than 1   Wound 12/14/23 Groin Right 12/14/23  --  Groin  41             Inpatient Medications  Scheduled Meds:  feeding supplement  237 mL Oral TID BM   lactulose   20 g Oral BID   lidocaine  (PF)  10 mL Intradermal Once   lidocaine  (PF)  30 mL Intradermal Once   midodrine   10 mg Oral TID WC   multivitamin with minerals  1 tablet Oral Daily   pantoprazole  (PROTONIX ) IV  40 mg Intravenous  Q12H   rifaximin   550 mg Oral BID   Continuous Infusions:  cefTRIAXone  (ROCEPHIN )  IV 2 g (01/23/24 1630)   PRN Meds:.albuterol , fentaNYL , hydrALAZINE , midazolam  PF, ondansetron  (ZOFRAN ) IV  DVT Prophylaxis  SCDs Start: 01/17/24 0147   Recent Labs  Lab 01/21/24 1155 01/21/24 2117 01/22/24 1232 01/23/24 0309 01/23/24 1752 01/24/24 0243  WBC 9.0 10.9* 11.3* 10.3  --  13.5*  HGB 8.6* 8.3* 8.8* 9.1*  --  8.6*  HCT 25.0* 24.4* 25.4* 26.2*  --  24.6*  PLT 38* 47* 44* 26* 52* 46*  MCV 105.9* 105.2* 105.4* 104.8*  --  105.6*  MCH 36.4* 35.8* 36.5* 36.4*  --  36.9*  MCHC 34.4 34.0 34.6 34.7  --  35.0  RDW 16.0* 16.1* 16.0* 16.0*  --  16.0*  LYMPHSABS 0.4* 0.5* 0.5*  --   --  0.4*  MONOABS 0.3 0.7 0.7  --   --  1.0  EOSABS 0.0 0.1 0.1  --   --  0.3  BASOSABS 0.0 0.0 0.0  --   --  0.0    Recent Labs  Lab 01/17/24 1425 01/18/24 0358 01/19/24 0309 01/20/24 0446 01/21/24 0348 01/22/24 0508 01/22/24 0740 01/23/24 0309 01/23/24 1752 01/24/24 0243 01/24/24 0900  NA  --  139   < > 139 145  --  146* 142  --  140  --   K  --  4.0   < > 4.6 4.6  --  3.9 4.0  --  3.6  --   CL  --  105   < > 104 109  --  109 107  --  106  --    CO2  --  23   < > 26 23  --  24 25  --  24  --   ANIONGAP  --  11   < > 9 13  --  12 10  --  10  --   GLUCOSE  --  123*   < > 121* 148*  --  159* 139*  --  155*  --   BUN  --  54*   < > 46* 57*  --  75* 68*  --  57*  --   CREATININE  --  1.93*   < > 1.53* 1.71*  --  1.90* 1.87*  --  1.69*  --   AST  --  25  --   --   --   --  18 26  --  18  --   ALT  --  16  --   --   --   --  11 11  --  10  --   ALKPHOS  --  67  --   --   --   --  58 55  --  59  --   BILITOT  --  3.1*  --   --   --   --  3.0* 2.1*  --  2.0*  --   ALBUMIN   --  3.0*  --   --   --   --  3.7 3.5  --  3.4*  --   DDIMER  --   --   --   --   --   --   --   --  12.55*  --   --   INR  --   --   --  2.1*  --  2.8*  --  2.3* 2.0* 1.9*  --  AMMONIA 64*  --   --   --   --   --   --   --   --   --  38*  MG  --   --   --   --   --   --  2.4 2.4  --  2.3  --   CALCIUM   --  8.3*   < > 8.4* 8.5*  --  8.5* 8.4*  --  8.3*  --    < > = values in this interval not displayed.      Recent Labs  Lab 01/17/24 1425 01/18/24 0358 01/20/24 0446 01/21/24 0348 01/22/24 0508 01/22/24 0740 01/23/24 0309 01/23/24 1752 01/24/24 0243 01/24/24 0900  DDIMER  --   --   --   --   --   --   --  12.55*  --   --   INR  --   --  2.1*  --  2.8*  --  2.3* 2.0* 1.9*  --   AMMONIA 64*  --   --   --   --   --   --   --   --  38*  MG  --   --   --   --   --  2.4 2.4  --  2.3  --   CALCIUM   --    < > 8.4* 8.5*  --  8.5* 8.4*  --  8.3*  --    < > = values in this interval not displayed.    --------------------------------------------------------------------------------------------------------------- Lab Results  Component Value Date   CHOL 143 03/28/2023   HDL 46.20 03/28/2023   LDLCALC 71 03/28/2023   LDLDIRECT 110.0 12/09/2020   TRIG 127.0 03/28/2023   CHOLHDL 3 03/28/2023    Lab Results  Component Value Date   HGBA1C 4.9 12/14/2023   No results for input(s): TSH, T4TOTAL, FREET4, T3FREE, THYROIDAB in the last 72 hours. Recent  Labs    01/23/24 1752  VITAMINB12 1,690*  FERRITIN 524*  TIBC NOT CALCULATED  IRON 50  RETICCTPCT 3.7*   ------------------------------------------------------------------------------------------------------------------ Cardiac Enzymes No results for input(s): CKMB, TROPONINI, MYOGLOBIN in the last 168 hours.  Invalid input(s): CK  Micro Results Recent Results (from the past 240 hours)  Body fluid culture w Gram Stain     Status: None   Collection Time: 01/20/24 12:35 PM   Specimen: PATH Cytology Peritoneal fluid  Result Value Ref Range Status   Specimen Description PERITONEAL  Final   Special Requests NONE  Final   Gram Stain NO WBC SEEN NO ORGANISMS SEEN   Final   Culture   Final    NO GROWTH 3 DAYS Performed at [facility], [address], [place], [place] [address]    Report Status 01/23/2024 FINAL  Final  Culture, body fluid w Gram Stain-bottle     Status: None (Preliminary result)   Collection Time: 01/22/24 11:01 AM   Specimen: Fluid  Result Value Ref Range Status   Specimen Description FLUID PERITONEAL ABDOMEN  Final   Special Requests BOTTLES DRAWN AEROBIC AND ANAEROBIC  Final   Culture   Final    NO GROWTH 2 DAYS Performed at [facility], [address], [place], [place] [address]    Report Status PENDING  Incomplete  Gram stain     Status: None   Collection Time: 01/22/24 11:01 AM   Specimen: Fluid  Result Value Ref Range Status   Specimen Description FLUID  PERITONEAL ABDOMEN  Final   Special Requests NONE  Final   Gram Stain   Final    FEW WBC PRESENT, PREDOMINANTLY MONONUCLEAR NO ORGANISMS SEEN Performed at [facility], [address] [place], [place] [address]    Report Status 01/22/2024 FINAL  Final    Radiology Reports  US  ABD LTD RUG W/LIVER DOPPLER Result Date: 01/22/2024 EXAM: US  Duplex Arterial/Venous of the Abdomen, Complete. TECHNIQUE: Real-time duplex ultrasound scan of the arterial and venous flow of the  pelvis with color Doppler flow and spectral waveform analysis. COMPARISON: US  Hepatic liver 01/16/2024. CLINICAL HISTORY: S/P TIPS (transjugular intrahepatic portosystemic shunt). Post-TIPS creation 12/14/2023. FINDINGS: PORTAL VEINS: Normal velocity. Hepatopetal flow. Antegrade flow in the main portal vein. No definite varices were identified. HEPATIC ARTERY: Normal flow. HEPATIC VEINS: Continuous hepatic flow in right and middle hepatic veins. SPLENIC VEIN: Normal flow. TIPS: No definite color flow signal in the TIPS. Nondirectional monophasic flow signal is recorded in the region of the TIPS by the sonographer. LIVER: Nodular hepatic contour. GALLBLADDER: Gallbladder wall thickness 2.9 mm. BILE DUCTS: CBD diameter of 3.8 mm. ABDOMEN: Small volume abdominal ascites. LIMITATIONS/ARTIFACTS: Technically difficult study secondary to body habitus. IMPRESSION: 1. No color flow within the TIPS, with monophasic nondirectional flow in the region of the TIPS, concerning for TIPS dysfunction or occlusion. 2. Small volume abdominal ascites. Electronically signed by: Katheleen Faes MD MD 01/22/2024 02:04 PM EST RP Workstation: [id number]   IR Paracentesis Result Date: 01/22/2024 INDICATION: 74 year old male with history of MASH and cirrhosis with ascites. History of TIPS procedure performed on 12/14/2023. Received request for diagnostic paracentesis only. EXAM: ULTRASOUND GUIDED DIAGNOSTIC, LEFT-SIDED PARACENTESIS MEDICATIONS: 6 mL 1% lidocaine  with epinephrine  COMPLICATIONS: None immediate. PROCEDURE: Informed written consent was obtained from the patient after a discussion of the risks, benefits and alternatives to treatment. A timeout was performed prior to the initiation of the procedure. Initial ultrasound scanning demonstrates a small amount of ascites within the right lower abdominal quadrant. The right lower abdomen was prepped and draped in the usual sterile fashion. 1% lidocaine  with epinephrine  was used for local  anesthesia. Following this, a 19 gauge, 7-cm, Yueh catheter was introduced. An ultrasound image was saved for documentation purposes. The paracentesis was performed. The catheter was removed and a dressing was applied. The patient tolerated the procedure well without immediate post procedural complication. FINDINGS: A total of approximately 100 mL of cloudy amber fluid was removed. Samples were sent to the laboratory as requested by the clinical team. IMPRESSION: Successful ultrasound-guided paracentesis yielding 100 mL of peritoneal fluid. Performed by: Rayfield Buff, NP under the supervision of Dr. Cordella Banner Electronically Signed   By: Cordella Banner   On: 01/22/2024 12:24      Signature  -   Lavada Stank M.D on 01/24/2024 at 10:25 AM   -  To page go to [email or web address]      "

## 2024-01-24 NOTE — Procedures (Addendum)
 Vascular and Interventional Radiology Procedure Note  Patient: David Esparza DOB: 19-Jul-1950 Medical Record Number: [id number] Note Date/Time: 01/24/2024 9:15 AM   Performing Physician: Thom Hall, MD Assistant(s): None  Diagnosis: cirrhosis with pHTN and refractory ascites. Prior TIPS, 12/14/23  Procedure(s):  *Aborted* TRANSJUGULAR INTRAHEPATIC PORTOSYSTEMIC SHUNT (TIPS) REVISION THERAPEUTIC PARACENTESIS  Anesthesia: Conscious Sedation Complications: None Estimated Blood Loss: Minimal Specimens: Gram Stain, Aerobe Culture, and Anerobe Culture  Findings:  - access via the RIGHT jugular veins. - TIPS and portal vein thrombosis. No revision performed. - Measured portosystemic gradient of 20 (RA 6, PV 26) - Therapeutic paracentesis with 4.0 L of serous ascites removed.   Plan: - Bedrest x2 hrs and RLE straight. No HOB restrictions. - Lactulose  TID and monitor for hepatic encephalopathy. - CT Venogram to assist in planning for definitive management of TIPS and PV thrombosis.  Final report to follow once all images are reviewed and compared with previous studies.  See detailed dictation with images in PACS. The patient tolerated the procedure well without incident or complication and was returned to Recovery in stable condition.    Thom Hall, MD [facility] Radiology   Pager. [phone] Clinic. [phone]

## 2024-01-24 NOTE — Sedation Documentation (Addendum)
 IR TIPS Assessment Pressures   Pre:  Right Atrium Mean: 6 Main Portal Vein: 26 Mean Portosystemic Gradient: 20

## 2024-01-24 NOTE — Plan of Care (Signed)
  Problem: Education: Goal: Knowledge of General Education information will improve Description: Including pain rating scale, medication(s)/side effects and non-pharmacologic comfort measures Outcome: Progressing   Problem: Clinical Measurements: Goal: Respiratory complications will improve Outcome: Progressing   Problem: Clinical Measurements: Goal: Cardiovascular complication will be avoided Outcome: Progressing   Problem: Coping: Goal: Level of anxiety will decrease Outcome: Progressing   

## 2024-01-24 NOTE — Progress Notes (Addendum)
 "    [facility] Progress Note  CC:  Cirrhosis, SBP  Subjective:  Saw patient in the elevator coming back from TIPs revision and paracentesis.  He was resting well post procedure.  Objective:  Vital signs in last 24 hours: Temp:  [97.6 F (36.4 C)-98.1 F (36.7 C)] 98.1 F (36.7 C) (01/14 0758) Pulse Rate:  [69-83] 75 (01/14 1030) Resp:  [12-20] 18 (01/14 1030) BP: (97-132)/(60-81) 116/78 (01/14 1030) SpO2:  [93 %-100 %] 100 % (01/14 1030) Last BM Date : 01/23/24 General:  Sleeping, chronically ill-appearing  Intake/Output from previous day: 01/13 0701 - 01/14 0700 In: 1722.2 [I.V.:1722.2] Out: 1951 [Urine:1150; Stool:800]  Lab Results: Recent Labs    01/22/24 1232 01/23/24 0309 01/23/24 1752 01/24/24 0243  WBC 11.3* 10.3  --  13.5*  HGB 8.8* 9.1*  --  8.6*  HCT 25.4* 26.2*  --  24.6*  PLT 44* 26* 52* 46*   BMET Recent Labs    01/22/24 0740 01/23/24 0309 01/24/24 0243  NA 146* 142 140  K 3.9 4.0 3.6  CL 109 107 106  CO2 24 25 24   GLUCOSE 159* 139* 155*  BUN 75* 68* 57*  CREATININE 1.90* 1.87* 1.69*  CALCIUM  8.5* 8.4* 8.3*   LFT Recent Labs    01/24/24 0243  PROT 5.7*  ALBUMIN  3.4*  AST 18  ALT 10  ALKPHOS 59  BILITOT 2.0*   PT/INR Recent Labs    01/23/24 1752 01/24/24 0243  LABPROT 23.7* 23.0*  INR 2.0* 1.9*   Hepatitis Panel Recent Labs    01/23/24 1752  HEPBSAG NON REACTIVE  HCVAB NON REACTIVE  HEPAIGM NON REACTIVE  HEPBIGM NON REACTIVE    US  ABD LTD RUG W/LIVER DOPPLER Result Date: 01/22/2024 EXAM: US  Duplex Arterial/Venous of the Abdomen, Complete. TECHNIQUE: Real-time duplex ultrasound scan of the arterial and venous flow of the pelvis with color Doppler flow and spectral waveform analysis. COMPARISON: US  Hepatic liver 01/16/2024. CLINICAL HISTORY: S/P TIPS (transjugular intrahepatic portosystemic shunt). Post-TIPS creation 12/14/2023. FINDINGS: PORTAL VEINS: Normal velocity. Hepatopetal flow. Antegrade flow in the  main portal vein. No definite varices were identified. HEPATIC ARTERY: Normal flow. HEPATIC VEINS: Continuous hepatic flow in right and middle hepatic veins. SPLENIC VEIN: Normal flow. TIPS: No definite color flow signal in the TIPS. Nondirectional monophasic flow signal is recorded in the region of the TIPS by the sonographer. LIVER: Nodular hepatic contour. GALLBLADDER: Gallbladder wall thickness 2.9 mm. BILE DUCTS: CBD diameter of 3.8 mm. ABDOMEN: Small volume abdominal ascites. LIMITATIONS/ARTIFACTS: Technically difficult study secondary to body habitus. IMPRESSION: 1. No color flow within the TIPS, with monophasic nondirectional flow in the region of the TIPS, concerning for TIPS dysfunction or occlusion. 2. Small volume abdominal ascites. Electronically signed by: Katheleen Faes MD MD 01/22/2024 02:04 PM EST RP Workstation: [id number]   IR Paracentesis Result Date: 01/22/2024 INDICATION: 74 year old male with history of MASH and cirrhosis with ascites. History of TIPS procedure performed on 12/14/2023. Received request for diagnostic paracentesis only. EXAM: ULTRASOUND GUIDED DIAGNOSTIC, LEFT-SIDED PARACENTESIS MEDICATIONS: 6 mL 1% lidocaine  with epinephrine  COMPLICATIONS: None immediate. PROCEDURE: Informed written consent was obtained from the patient after a discussion of the risks, benefits and alternatives to treatment. A timeout was performed prior to the initiation of the procedure. Initial ultrasound scanning demonstrates a small amount of ascites within the right lower abdominal quadrant. The right lower abdomen was prepped and draped in the usual sterile fashion. 1% lidocaine  with epinephrine  was used for local anesthesia. Following  this, a 19 gauge, 7-cm, Yueh catheter was introduced. An ultrasound image was saved for documentation purposes. The paracentesis was performed. The catheter was removed and a dressing was applied. The patient tolerated the procedure well without immediate post procedural  complication. FINDINGS: A total of approximately 100 mL of cloudy amber fluid was removed. Samples were sent to the laboratory as requested by the clinical team. IMPRESSION: Successful ultrasound-guided paracentesis yielding 100 mL of peritoneal fluid. Performed by: Rayfield Buff, NP under the supervision of Dr. Cordella Banner Electronically Signed   By: Cordella Banner   On: 01/22/2024 12:24   Assessment / Plan: Occluded TIPS on US  1/12 with worsening ascites- IR consulted and he attempt at TIPS revision this AM but unfortunately he was found to have TIPS and PVT thrombosis so it could not be performed.  Did have 4 Liter paracentesis. 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 SBP- Dx 1/10 on ceftriaxone /alb. Rpt paracentesis done 1/12 with some improvement. UGI bleed-appears to have stabilized on octreotide /Protonix  Worsening thrombocytopenia at 26K yesterday but up to 46K today AKI-multifactorial with component of HRS. Cr 1.69 today which is slightly improved   Plan: - Appreciate IR with attempt at TIPS revision and paracentesis this AM.  Will follow-up cell count from fluid.  IR has ordered a CTA to better assess and make a plan from there. - For now continue ceftriaxone .   - Continue lactulose /rifaximin . - Appreciate renal and hematology consultations as well. - Overall poor prognosis.   MELD 3.0: 21 at 01/24/2024  2:43 AM MELD-Na: 21 at 01/24/2024  2:43 AM Calculated from: Serum Creatinine: 1.69 mg/dL at 8/85/7973  7:56 AM Serum Sodium: 140 mmol/L (Using max of 137 mmol/L) at 01/24/2024  2:43 AM Total Bilirubin: 2 mg/dL at 8/85/7973  7:56 AM Serum Albumin : 3.4 g/dL at 8/85/7973  7:56 AM INR(ratio): 1.9 at 01/24/2024  2:43 AM Age at listing (hypothetical): 4 years Sex: Male at 01/24/2024  2:43 AM    LOS: 8 days   Stepfon Rawles D. Giulio Bertino  01/24/2024, 10:44 AM    "

## 2024-01-24 NOTE — Plan of Care (Signed)
" °  Problem: Education: Goal: Knowledge of General Education information will improve Description: Including pain rating scale, medication(s)/side effects and non-pharmacologic comfort measures Outcome: Progressing   Problem: Health Behavior/Discharge Planning: Goal: Ability to manage health-related needs will improve Outcome: Progressing   Problem: Clinical Measurements: Goal: Will remain free from infection Outcome: Progressing Goal: Diagnostic test results will improve Outcome: Progressing Goal: Cardiovascular complication will be avoided Outcome: Progressing   Problem: Elimination: Goal: Will not experience complications related to bowel motility Outcome: Progressing Goal: Will not experience complications related to urinary retention Outcome: Progressing   "

## 2024-01-24 NOTE — Progress Notes (Signed)
 Admit: 01/16/2024 LOS: 8  25M with advanced cirrhosis including large volume ascites status post TIPS 12/2023, recurrent encephalopathy, history of varices who has a stuttering AKI.   Subjective:  4 L paracentesis and TIPS study today with IR Creatinine slightly improved, UOP about 1.2 L Patient without complaints, alone in the room  01/13 0701 - 01/14 0700 In: 1722.2 [I.V.:1722.2] Out: 1950 [Urine:1150; Stool:800]  Filed Weights   01/16/24 1031  Weight: 105 kg    Scheduled Meds:  feeding supplement  237 mL Oral TID BM   lactulose   20 g Oral BID   lidocaine  (PF)  10 mL Intradermal Once   lidocaine  (PF)  30 mL Intradermal Once   midodrine   10 mg Oral TID WC   multivitamin with minerals  1 tablet Oral Daily   pantoprazole  (PROTONIX ) IV  40 mg Intravenous Q12H   rifaximin   550 mg Oral BID   Continuous Infusions:  cefTRIAXone  (ROCEPHIN )  IV 2 g (01/23/24 1630)   PRN Meds:.albuterol , hydrALAZINE , ondansetron  (ZOFRAN ) IV  Current Labs: reviewed    Physical Exam:  Blood pressure 115/73, pulse 74, temperature 97.7 F (36.5 C), temperature source Oral, resp. rate 16, height 5' 7 (1.702 m), weight 105 kg, SpO2 97%. GEN: Elderly male, mildly confused ENT: NCAT EYES: EOMI CV: Regular, no rub PULM: Diminished in the bases ABD: Protuberant, soft SKIN: No petechia or purpura EXT: No significant edema  A Nonoliguric AKI with normal baseline GFR: Likely multifactorial and included presentation with component of HRS.  Slowly improving    imaging has been reassuring.  Would not resume any diuretics at the current time.  Continue supportive care Decompensated cirrhosis presenting with encephalopathy, having GI bleed: Gastroenterology is following.  On octreotide .  Requiring midodrine .  Status post TIPS Upper GI bleed on octreotide  with GI following  P No diuretics, no IV fluids at this time Continue supportive care Daily weights, Daily Renal Panel, Strict I/Os, Avoid nephrotoxins  (NSAIDs, judicious IV Contrast)  Medication Issues; Preferred narcotic agents for pain control are hydromorphone, fentanyl , and methadone. Morphine  should not be used.  Baclofen should be avoided Avoid oral sodium phosphate  and magnesium citrate based laxatives / bowel preps    Bernardino Gasman MD 01/24/2024, 1:09 PM  Recent Labs  Lab 01/22/24 0740 01/23/24 0309 01/24/24 0243  NA 146* 142 140  K 3.9 4.0 3.6  CL 109 107 106  CO2 24 25 24   GLUCOSE 159* 139* 155*  BUN 75* 68* 57*  CREATININE 1.90* 1.87* 1.69*  CALCIUM  8.5* 8.4* 8.3*   Recent Labs  Lab 01/21/24 2117 01/22/24 1232 01/23/24 0309 01/23/24 1752 01/24/24 0243  WBC 10.9* 11.3* 10.3  --  13.5*  NEUTROABS 9.5* 9.8*  --   --  11.7*  HGB 8.3* 8.8* 9.1*  --  8.6*  HCT 24.4* 25.4* 26.2*  --  24.6*  MCV 105.2* 105.4* 104.8*  --  105.6*  PLT 47* 44* 26* 52* 46*

## 2024-01-25 ENCOUNTER — Inpatient Hospital Stay (HOSPITAL_COMMUNITY)

## 2024-01-25 DIAGNOSIS — R578 Other shock: Secondary | ICD-10-CM

## 2024-01-25 DIAGNOSIS — K7682 Hepatic encephalopathy: Secondary | ICD-10-CM | POA: Diagnosis not present

## 2024-01-25 DIAGNOSIS — D696 Thrombocytopenia, unspecified: Secondary | ICD-10-CM | POA: Diagnosis not present

## 2024-01-25 DIAGNOSIS — N179 Acute kidney failure, unspecified: Secondary | ICD-10-CM | POA: Diagnosis not present

## 2024-01-25 DIAGNOSIS — K7460 Unspecified cirrhosis of liver: Secondary | ICD-10-CM | POA: Diagnosis not present

## 2024-01-25 DIAGNOSIS — D631 Anemia in chronic kidney disease: Secondary | ICD-10-CM

## 2024-01-25 DIAGNOSIS — R579 Shock, unspecified: Secondary | ICD-10-CM | POA: Diagnosis not present

## 2024-01-25 DIAGNOSIS — K922 Gastrointestinal hemorrhage, unspecified: Secondary | ICD-10-CM | POA: Diagnosis not present

## 2024-01-25 HISTORY — PX: IR TRANSHEPATIC PORTOGRAM WO HEMO: IMG691

## 2024-01-25 HISTORY — PX: IR US GUIDE VASC ACCESS RIGHT: IMG2390

## 2024-01-25 HISTORY — PX: IR THORACENTESIS RIGHT ASP PLEURAL SPACE W/IMG GUIDE: IMG5380

## 2024-01-25 HISTORY — PX: IR INFUSION THROMBOL VENOUS INITIAL (MS): IMG5377

## 2024-01-25 LAB — CBC
HCT: 25.7 % — ABNORMAL LOW (ref 39.0–52.0)
Hemoglobin: 9 g/dL — ABNORMAL LOW (ref 13.0–17.0)
MCH: 37 pg — ABNORMAL HIGH (ref 26.0–34.0)
MCHC: 35 g/dL (ref 30.0–36.0)
MCV: 105.8 fL — ABNORMAL HIGH (ref 80.0–100.0)
Platelets: 63 K/uL — ABNORMAL LOW (ref 150–400)
RBC: 2.43 MIL/uL — ABNORMAL LOW (ref 4.22–5.81)
RDW: 17.5 % — ABNORMAL HIGH (ref 11.5–15.5)
WBC: 19.2 K/uL — ABNORMAL HIGH (ref 4.0–10.5)
nRBC: 0.2 % (ref 0.0–0.2)

## 2024-01-25 LAB — COMPREHENSIVE METABOLIC PANEL WITH GFR
ALT: 10 U/L (ref 0–44)
AST: 19 U/L (ref 15–41)
Albumin: 3.2 g/dL — ABNORMAL LOW (ref 3.5–5.0)
Alkaline Phosphatase: 73 U/L (ref 38–126)
Anion gap: 9 (ref 5–15)
BUN: 48 mg/dL — ABNORMAL HIGH (ref 8–23)
CO2: 24 mmol/L (ref 22–32)
Calcium: 8.1 mg/dL — ABNORMAL LOW (ref 8.9–10.3)
Chloride: 105 mmol/L (ref 98–111)
Creatinine, Ser: 1.69 mg/dL — ABNORMAL HIGH (ref 0.61–1.24)
GFR, Estimated: 42 mL/min — ABNORMAL LOW
Glucose, Bld: 176 mg/dL — ABNORMAL HIGH (ref 70–99)
Potassium: 3.7 mmol/L (ref 3.5–5.1)
Sodium: 138 mmol/L (ref 135–145)
Total Bilirubin: 1.7 mg/dL — ABNORMAL HIGH (ref 0.0–1.2)
Total Protein: 5.4 g/dL — ABNORMAL LOW (ref 6.5–8.1)

## 2024-01-25 LAB — FIBRINOGEN: Fibrinogen: 625 mg/dL — ABNORMAL HIGH (ref 210–475)

## 2024-01-25 LAB — CBC WITH DIFFERENTIAL/PLATELET
Abs Immature Granulocytes: 0.27 K/uL — ABNORMAL HIGH (ref 0.00–0.07)
Basophils Absolute: 0 K/uL (ref 0.0–0.1)
Basophils Relative: 0 %
Eosinophils Absolute: 0.3 K/uL (ref 0.0–0.5)
Eosinophils Relative: 2 %
HCT: 23.9 % — ABNORMAL LOW (ref 39.0–52.0)
Hemoglobin: 8.3 g/dL — ABNORMAL LOW (ref 13.0–17.0)
Immature Granulocytes: 2 %
Lymphocytes Relative: 3 %
Lymphs Abs: 0.5 K/uL — ABNORMAL LOW (ref 0.7–4.0)
MCH: 36.4 pg — ABNORMAL HIGH (ref 26.0–34.0)
MCHC: 34.7 g/dL (ref 30.0–36.0)
MCV: 104.8 fL — ABNORMAL HIGH (ref 80.0–100.0)
Monocytes Absolute: 1.6 K/uL — ABNORMAL HIGH (ref 0.1–1.0)
Monocytes Relative: 10 %
Neutro Abs: 13.5 K/uL — ABNORMAL HIGH (ref 1.7–7.7)
Neutrophils Relative %: 83 %
Platelets: 59 K/uL — ABNORMAL LOW (ref 150–400)
RBC: 2.28 MIL/uL — ABNORMAL LOW (ref 4.22–5.81)
RDW: 16.3 % — ABNORMAL HIGH (ref 11.5–15.5)
Smear Review: NORMAL
WBC: 16.2 K/uL — ABNORMAL HIGH (ref 4.0–10.5)
nRBC: 0.2 % (ref 0.0–0.2)

## 2024-01-25 LAB — MAGNESIUM: Magnesium: 2.4 mg/dL (ref 1.7–2.4)

## 2024-01-25 LAB — GLUCOSE, CAPILLARY: Glucose-Capillary: 124 mg/dL — ABNORMAL HIGH (ref 70–99)

## 2024-01-25 LAB — MRSA NEXT GEN BY PCR, NASAL: MRSA by PCR Next Gen: NOT DETECTED

## 2024-01-25 LAB — MASSIVE TRANSFUSION PROTOCOL ORDER (BLOOD BANK NOTIFICATION)

## 2024-01-25 MED ORDER — SODIUM CHLORIDE 0.9% FLUSH
3.0000 mL | Freq: Two times a day (BID) | INTRAVENOUS | Status: DC
  Administered 2024-01-25 (×2): 3 mL via INTRAVENOUS

## 2024-01-25 MED ORDER — SODIUM CHLORIDE 0.9% IV SOLUTION: Freq: Once | INTRAVENOUS | Status: DC

## 2024-01-25 MED ORDER — LIDOCAINE-EPINEPHRINE 1 %-1:100000 IJ SOLN
INTRAMUSCULAR | Status: AC
  Filled 2024-01-25: qty 1

## 2024-01-25 MED ORDER — PROTAMINE SULFATE 10 MG/ML IV SOLN
25.0000 mg | Freq: Once | INTRAVENOUS | Status: DC
  Filled 2024-01-25: qty 2.5

## 2024-01-25 MED ORDER — HEPARIN (PORCINE) 25000 UT/250ML-% IV SOLN
1300.0000 [IU]/h | INTRAVENOUS | Status: DC
  Administered 2024-01-25: 1300 [IU]/h via INTRAVENOUS
  Filled 2024-01-25: qty 250

## 2024-01-25 MED ORDER — SODIUM CHLORIDE 0.9 % IV SOLN
12.0000 mg | Freq: Once | INTRAVENOUS | Status: AC
  Administered 2024-01-25: 12 mg via INTRAVENOUS
  Filled 2024-01-25: qty 12

## 2024-01-25 MED ORDER — OCTREOTIDE LOAD VIA INFUSION
50.0000 ug | Freq: Once | INTRAVENOUS | Status: DC
  Filled 2024-01-25: qty 25

## 2024-01-25 MED ORDER — SODIUM CHLORIDE 0.9% FLUSH: 3.0000 mL | INTRAVENOUS | Status: DC | PRN

## 2024-01-25 MED ORDER — ROCURONIUM BROMIDE 10 MG/ML (PF) SYRINGE
PREFILLED_SYRINGE | INTRAVENOUS | Status: AC
  Filled 2024-01-25: qty 10

## 2024-01-25 MED ORDER — NOREPINEPHRINE 4 MG/250ML-% IV SOLN: 0.0000 ug/min | INTRAVENOUS | Status: DC

## 2024-01-25 MED ORDER — CHLORHEXIDINE GLUCONATE CLOTH 2 % EX PADS
6.0000 | MEDICATED_PAD | Freq: Every day | CUTANEOUS | Status: DC
  Administered 2024-01-25: 6 via TOPICAL

## 2024-01-25 MED ORDER — SODIUM CHLORIDE 0.9 % IV SOLN: 250.0000 mL | INTRAVENOUS | Status: DC

## 2024-01-25 MED ORDER — LIDOCAINE-EPINEPHRINE 1 %-1:100000 IJ SOLN
20.0000 mL | Freq: Once | INTRAMUSCULAR | Status: AC
  Administered 2024-01-25: 20 mL
  Filled 2024-01-25: qty 20

## 2024-01-25 MED ORDER — ALBUMIN HUMAN 25 % IV SOLN
25.0000 g | Freq: Once | INTRAVENOUS | Status: AC
  Administered 2024-01-25: 25 g via INTRAVENOUS

## 2024-01-25 MED ORDER — NOREPINEPHRINE 4 MG/250ML-% IV SOLN
INTRAVENOUS | Status: AC
  Administered 2024-01-25: 5 ug/min via INTRAVENOUS
  Filled 2024-01-25: qty 250

## 2024-01-25 MED ORDER — FENTANYL CITRATE (PF) 100 MCG/2ML IJ SOLN
INTRAMUSCULAR | Status: AC | PRN
  Administered 2024-01-25 (×3): 25 ug via INTRAVENOUS

## 2024-01-25 MED ORDER — EPINEPHRINE 1 MG/10ML IV SOSY
PREFILLED_SYRINGE | INTRAVENOUS | Status: AC
  Filled 2024-01-25: qty 10

## 2024-01-25 MED ORDER — PANTOPRAZOLE SODIUM 40 MG IV SOLR: 40.0000 mg | Freq: Two times a day (BID) | INTRAVENOUS | Status: DC

## 2024-01-25 MED ORDER — SODIUM BICARBONATE 8.4 % IV SOLN
INTRAVENOUS | Status: AC
  Filled 2024-01-25: qty 50

## 2024-01-25 MED ORDER — ORAL CARE MOUTH RINSE: 15.0000 mL | OROMUCOSAL | Status: DC | PRN

## 2024-01-25 MED ORDER — FENTANYL CITRATE (PF) 100 MCG/2ML IJ SOLN
INTRAMUSCULAR | Status: AC
  Filled 2024-01-25: qty 2

## 2024-01-25 MED ORDER — PANTOPRAZOLE SODIUM 40 MG IV SOLR: 40.0000 mg | INTRAVENOUS | Status: AC

## 2024-01-25 MED ORDER — CALCIUM GLUCONATE-NACL 2-0.675 GM/100ML-% IV SOLN: 2.0000 g | Freq: Once | INTRAVENOUS | Status: DC

## 2024-01-25 MED ORDER — SODIUM CHLORIDE 0.9 % IV SOLN
50.0000 ug/h | INTRAVENOUS | Status: DC
  Filled 2024-01-25: qty 1

## 2024-01-25 MED ORDER — HEPARIN SODIUM (PORCINE) 1000 UNIT/ML IJ SOLN
INTRAMUSCULAR | Status: AC
  Filled 2024-01-25: qty 10

## 2024-01-25 MED ORDER — IOHEXOL 300 MG/ML  SOLN
100.0000 mL | Freq: Once | INTRAMUSCULAR | Status: AC | PRN
  Administered 2024-01-25: 45 mL via INTRA_ARTERIAL

## 2024-01-25 MED ORDER — SODIUM CHLORIDE 0.9 % IV SOLN: INTRAVENOUS | Status: DC

## 2024-01-25 MED ORDER — VASOPRESSIN 20 UNITS/100 ML INFUSION FOR SHOCK
INTRAVENOUS | Status: AC
  Filled 2024-01-25: qty 100

## 2024-01-25 MED ORDER — EPINEPHRINE 1 MG/10ML IV SOSY: 1.0000 mg | PREFILLED_SYRINGE | Freq: Once | INTRAVENOUS | Status: DC

## 2024-01-25 MED ORDER — ALBUMIN HUMAN 5 % IV SOLN
INTRAVENOUS | Status: AC
  Filled 2024-01-25: qty 500

## 2024-01-25 MED ORDER — ETOMIDATE 2 MG/ML IV SOLN
INTRAVENOUS | Status: AC
  Filled 2024-01-25: qty 20

## 2024-01-25 MED ORDER — SODIUM BICARBONATE 8.4 % IV SOLN: 50.0000 meq | Freq: Once | INTRAVENOUS | Status: DC

## 2024-01-25 MED ORDER — SODIUM CHLORIDE 0.9 % IV SOLN
250.0000 mL | INTRAVENOUS | Status: DC | PRN
  Administered 2024-01-25: 250 mL via INTRAVENOUS

## 2024-01-25 MED ORDER — SODIUM CHLORIDE 0.9 % IV SOLN: 250.0000 mL | INTRAVENOUS | Status: DC | PRN

## 2024-01-25 MED ORDER — LACTATED RINGERS IV BOLUS: 500.0000 mL | Freq: Once | INTRAVENOUS | Status: DC

## 2024-01-25 NOTE — Plan of Care (Signed)

## 2024-01-25 NOTE — Progress Notes (Signed)
 Called emergently to the room for hypotension, impending arrest Being bagged by RT, hypotensive on arrival. tPA infusion stopped IO access obtained, LR infused open. Intubated with 20 etomidate , copious blood pulling oropharyngeal suctioned out about 2 canisters. Massive transfusion protocol initiated Right femoral Cordis placed, transfused 8 units of PRBC and 6 units FFP. Cryo called for. Protamine  given to reverse IV heparin . GI contacted who will assess. Wife arrived >> discussed massive hematemesis likely variceal and ongoing hemodynamic resuscitation for hemorrhagic shock.  Wife agreed to no CPR no cardioversion.  Required 2-3 amps epinephrine  and bicarbonate during resuscitation  My critical care time was 45 minutes independent of procedures  Eniola Cerullo V. Jude MD

## 2024-01-25 NOTE — Progress Notes (Addendum)
"                        PROGRESS NOTE        PATIENT DETAILS Name: David Esparza Age: 74 y.o. Sex: male Date of Birth: [birth year] Admit Date: 01/16/2024 Admitting Physician Sigurd Pac, MD ERE:Aobuy, Harlene LABOR, MD  Brief Summary:  74 year old with cirrhosis-recent TIPS-noncompliance with lactulose -presented with hepatic encephalopathy and AKI.   Significant events:  1/6>>admit to [facility]  Significant studies:  1/6>> CXR: Interval worsening/opacification over mid/right lower lung suggestive of moderate-sized pleural effusion. 1/6>> CT head: No acute intracranial abnormality 1/6>> US  ascites: Large volume ascites 1/6>> US  liver Doppler: Moderate ascites, TIPS stent in place, hepatic cirrhosis. 1/7>> IR abdomen US  limited: No significant pocket for paracentesis 1/8>> US  renal: No hydronephrosis 1/8>> CXR: Persistent large right pleural effusion. 1/12.  Repeat abdominal ultrasound 1. No color flow within the TIPS, with monophasic nondirectional flow in the region of the TIPS, concerning for TIPS dysfunction or occlusion. 2. Small volume abdominal ascites 1/12.  Repeat paracentesis with 100 cc of fluid removed 1/14 2026 by IR.  Aborted TIPS revision procedure. 1/14.  By IR THERAPEUTIC PARACENTESIS, 4 L of ascitic fluid removed.  01/27/2024.  Repeat CT angiogram of the abdomen pelvis. IMPRESSION: VASCULAR   1. Thrombosis of the main portal vein, proximal superior mesenteric vein, proximal inferior mesenteric vein and the central aspect of the splenic vein. TIPS stent is thrombosed. Limited evaluation of the intrahepatic portal veins. 2. Large gastroesophageal varices. Prior coil embolization of the left gastric vein. 3.  Aortic Atherosclerosis (ICD10-I70.0).   NON-VASCULAR   1. Persistent large right pleural effusion. Findings are compatible with a hepatic hydrothorax. 2. Cirrhosis with portal hypertension. 3. New low-density areas along the posterior and inferior aspect of the  spleen. These low-density areas could be related to small infarcts. 4. Increased mesenteric edema particularly in the upper abdomen around the pancreas and near the root of the mesentery. Findings are likely associated with thrombosis involving the portal venous system and proximal mesenteric veins. 5. Bilateral inguinal hernias.  Small umbilical hernia. 6. Small volume of abdominal and pelvic ascites. Some of the ascites may be loculated. 7. Mild wall thickening involving loops of small bowel in the left lower quadrant. Findings are nonspecific but could be associated with venous congestion from the mesenteric venous thrombosis.   Significant microbiology data: None   Consults: GI, IR, nephrology  Subjective: Patient in bed, appears comfortable, denies any headache, no fever, no chest pain or pressure, no shortness of breath , no abdominal pain. No focal weakness.  Objective:  Vitals: Blood pressure 101/63, pulse 68, temperature (!) 97.5 F (36.4 C), temperature source Oral, resp. rate (!) 23, height 5' 7 (1.702 m), weight 105 kg, SpO2 97%.   Exam:  Awake Alert, No new F.N deficits, Normal affect [place].AT,PERRAL Supple Neck, No JVD,   Symmetrical Chest wall movement, Good air movement bilaterally, CTAB RRR,No Gallops, Rubs or new Murmurs,  +ve B.Sounds, Abd Soft, No tenderness,   No Cyanosis, Clubbing or edema    Assessment/Plan:  Hepatic encephalopathy Recent TIPS-noncompliant/poorly compliant with lactulose  Continues to improve Continue lactulose /rifaximin , dose adjusted on 01/22/2024 still having profuse diarrhea causing dehydration, mentation has improved and he is now at his baseline.   AKI, creatinine of 2.6 at time of admission Likely hemodynamically mediated-in the setting of poor oral intake due to hepatic encephalopathy-ongoing lisinopril  use. Gradual improvement with IV albumin  and initial IV fluids, continue  to monitor.  Nephrology on board as well managing  diuretics and fluid status.  Hypernatremia.  Resolved after gentle D5W on 01/22/2024 and discontinuation of Lasix  temporarily.  Hepatic hydrothorax and ascites with SBP Not very symptomatic On room air this morning Lasix  and albumin  as blood pressure and electrolytes can tolerate Does have large right-sided effusion, if becomes symptomatic will have him undergo ultrasound-guided thoracentesis.   NASH liver cirrhosis, portal hypertension, recent TIPS 01/04/2024.  SBP now. Thrombocytopenia likely secondary to hypersplenism Underwent paracentesis fluid suggestive of SBP, on appropriate antibiotics, follow cultures.  He has had 2 paracentesis so far this admission each time 3.5-4 L of ascitic fluid removed consistent with SBP. Note thrombocytopenia getting worse, will get hematology input on 01/23/2024, currently not bleeding.  Type screen is done.  Continue to monitor  TIPS occlusion noted on ultrasound 01/22/2024.  GI and IR managing. IR attempted TIPS revision on 01/24/2024 but this procedure was aborted, he is to undergo CT angiogram on 01/11/2024 to better define his hepatic anatomy.  Will defer management of this issue to IR and GI.   Dark emesis x 2 01/20/2024.  With history of NASH and portal hypertension was treated with PPI and octreotide , now on PPI only, type screen done GI following, stable CBC.  Hypertension.  Blood pressure soft again, low-dose midodrine .  And monitor.  Urinary retention on 01/24/2024.  Flomax  initiated, In-N-Out catheter returned a liter of fluid, due to his low platelets will avoid frequent In-N-Out will go ahead and place Foley catheter for at least a few days.  Class 2 Obesity: BMI 36.  Follow-up with PCP    Code status:   Code Status: Full Code   DVT Prophylaxis: SCDs Start: 01/17/24 0147   Family Communication: Spouse-Sharon and patient's daughter updated via room phone x 2 on 01/21/2024.  Spouse called on cell phone [phone] on 01/22/2024 at 8:50 AM and  updated, updated again 01/23/2024   Disposition Plan: Status is: Inpatient Remains inpatient appropriate because: Severity of illness   Planned Discharge Destination:Home   Diet: Diet Order             DIET SOFT Fluid consistency: Thin; Fluid restriction: 1500 mL Fluid  Diet effective now                     Antimicrobial agents: Anti-infectives (From admission, onward)    Start     Dose/Rate Route Frequency Ordered Stop   01/20/24 1545  cefTRIAXone  (ROCEPHIN ) 2 g in sodium chloride  0.9 % 100 mL IVPB        2 g 200 mL/hr over 30 Minutes Intravenous Every 24 hours 01/20/24 1456     01/20/24 1530  cefoTAXime (CLAFORAN) 2 g in dextrose  5 % 50 mL IVPB  Status:  Discontinued        2 g 140 mL/hr over 30 Minutes Intravenous Every 12 hours 01/20/24 1431 01/20/24 1455   01/16/24 1445  rifaximin  (XIFAXAN ) tablet 550 mg        550 mg Oral 2 times daily 01/16/24 1440     01/16/24 1430  cefTRIAXone  (ROCEPHIN ) 2 g in sodium chloride  0.9 % 100 mL IVPB  Status:  Discontinued        2 g 200 mL/hr over 30 Minutes Intravenous Every 24 hours 01/16/24 1420 01/16/24 1449       MEDICATIONS: Scheduled Meds:  feeding supplement  237 mL Oral TID BM   lactulose   20 g Oral BID   lidocaine  (  PF)  10 mL Intradermal Once   lidocaine  (PF)  30 mL Intradermal Once   midodrine   10 mg Oral TID WC   multivitamin with minerals  1 tablet Oral Daily   pantoprazole  (PROTONIX ) IV  40 mg Intravenous Q12H   rifaximin   550 mg Oral BID   Continuous Infusions:  cefTRIAXone  (ROCEPHIN )  IV 2 g (01/24/24 1458)   PRN Meds:.albuterol , hydrALAZINE , ondansetron  (ZOFRAN ) IV, traMADol    I have personally reviewed following labs and imaging studies  LABORATORY DATA:   Data Review:   Patient Lines/Drains/Airways Status     Active Line/Drains/Airways     Name Placement date Placement time Site Days   Peripheral IV 01/20/24 22 G 1 Left;Posterior Hand 01/20/24  2150  Hand  5   Peripheral IV 01/21/24 20 G  1.75 Anterior;Distal;Left;Upper Arm 01/21/24  1317  Arm  4   Wound 01/24/24 0945 Other (Comment) Neck Right 01/24/24  0945  Neck  1   Wound 12/14/23 Groin Right 12/14/23  --  Groin  42             Inpatient Medications  Scheduled Meds:  feeding supplement  237 mL Oral TID BM   lactulose   20 g Oral BID   lidocaine  (PF)  10 mL Intradermal Once   lidocaine  (PF)  30 mL Intradermal Once   midodrine   10 mg Oral TID WC   multivitamin with minerals  1 tablet Oral Daily   pantoprazole  (PROTONIX ) IV  40 mg Intravenous Q12H   rifaximin   550 mg Oral BID   Continuous Infusions:  cefTRIAXone  (ROCEPHIN )  IV 2 g (01/24/24 1458)   PRN Meds:.albuterol , hydrALAZINE , ondansetron  (ZOFRAN ) IV, traMADol   DVT Prophylaxis  SCDs Start: 01/17/24 0147   Recent Labs  Lab 01/21/24 1155 01/21/24 2117 01/22/24 1232 01/23/24 0309 01/23/24 1752 01/24/24 0243 01/24/24 2022 01/30/2024 0237  WBC 9.0 10.9* 11.3* 10.3  --  13.5* 16.4* 16.2*  HGB 8.6* 8.3* 8.8* 9.1*  --  8.6* 8.4* 8.3*  HCT 25.0* 24.4* 25.4* 26.2*  --  24.6* 24.5* 23.9*  PLT 38* 47* 44* 26* 52* 46* 56* 59*  MCV 105.9* 105.2* 105.4* 104.8*  --  105.6* 105.2* 104.8*  MCH 36.4* 35.8* 36.5* 36.4*  --  36.9* 36.1* 36.4*  MCHC 34.4 34.0 34.6 34.7  --  35.0 34.3 34.7  RDW 16.0* 16.1* 16.0* 16.0*  --  16.0* 16.4* 16.3*  LYMPHSABS 0.4* 0.5* 0.5*  --   --  0.4*  --  0.5*  MONOABS 0.3 0.7 0.7  --   --  1.0  --  1.6*  EOSABS 0.0 0.1 0.1  --   --  0.3  --  0.3  BASOSABS 0.0 0.0 0.0  --   --  0.0  --  0.0    Recent Labs  Lab 01/20/24 0446 01/21/24 0348 01/22/24 0508 01/22/24 0740 01/23/24 0309 01/23/24 1752 01/24/24 0243 01/24/24 0900 02/04/2024 0237  NA 139 145  --  146* 142  --  140  --  138  K 4.6 4.6  --  3.9 4.0  --  3.6  --  3.7  CL 104 109  --  109 107  --  106  --  105  CO2 26 23  --  24 25  --  24  --  24  ANIONGAP 9 13  --  12 10  --  10  --  9  GLUCOSE 121* 148*  --  159* 139*  --  155*  --  176*  BUN 46* 57*  --  75* 68*   --  57*  --  48*  CREATININE 1.53* 1.71*  --  1.90* 1.87*  --  1.69*  --  1.69*  AST  --   --   --  18 26  --  18  --  19  ALT  --   --   --  11 11  --  10  --  10  ALKPHOS  --   --   --  58 55  --  59  --  73  BILITOT  --   --   --  3.0* 2.1*  --  2.0*  --  1.7*  ALBUMIN   --   --   --  3.7 3.5  --  3.4*  --  3.2*  DDIMER  --   --   --   --   --  12.55*  --   --   --   INR 2.1*  --  2.8*  --  2.3* 2.0* 1.9*  --   --   AMMONIA  --   --   --   --   --   --   --  38*  --   MG  --   --   --  2.4 2.4  --  2.3  --  2.4  CALCIUM  8.4* 8.5*  --  8.5* 8.4*  --  8.3*  --  8.1*      Recent Labs  Lab 01/20/24 0446 01/21/24 0348 01/22/24 0508 01/22/24 0740 01/23/24 0309 01/23/24 1752 01/24/24 0243 01/24/24 0900 01/19/2024 0237  DDIMER  --   --   --   --   --  12.55*  --   --   --   INR 2.1*  --  2.8*  --  2.3* 2.0* 1.9*  --   --   AMMONIA  --   --   --   --   --   --   --  38*  --   MG  --   --   --  2.4 2.4  --  2.3  --  2.4  CALCIUM  8.4* 8.5*  --  8.5* 8.4*  --  8.3*  --  8.1*    --------------------------------------------------------------------------------------------------------------- Lab Results  Component Value Date   CHOL 143 03/28/2023   HDL 46.20 03/28/2023   LDLCALC 71 03/28/2023   LDLDIRECT 110.0 12/09/2020   TRIG 127.0 03/28/2023   CHOLHDL 3 03/28/2023    Lab Results  Component Value Date   HGBA1C 4.9 12/14/2023   No results for input(s): TSH, T4TOTAL, FREET4, T3FREE, THYROIDAB in the last 72 hours. Recent Labs    01/23/24 1752  VITAMINB12 1,690*  FERRITIN 524*  TIBC NOT CALCULATED  IRON 50  RETICCTPCT 3.7*   ------------------------------------------------------------------------------------------------------------------ Cardiac Enzymes No results for input(s): CKMB, TROPONINI, MYOGLOBIN in the last 168 hours.  Invalid input(s): CK  Micro Results Recent Results (from the past 240 hours)  Body fluid culture w Gram Stain     Status:  None   Collection Time: 01/20/24 12:35 PM   Specimen: PATH Cytology Peritoneal fluid  Result Value Ref Range Status   Specimen Description PERITONEAL  Final   Special Requests NONE  Final   Gram Stain NO WBC SEEN NO ORGANISMS SEEN   Final   Culture   Final    NO GROWTH 3 DAYS Performed at [facility], [address], [place], [place]  [address]    Report Status 01/23/2024 FINAL  Final  Culture, body fluid w Gram Stain-bottle     Status: None (Preliminary result)   Collection Time: 01/22/24 11:01 AM   Specimen: Fluid  Result Value Ref Range Status   Specimen Description FLUID PERITONEAL ABDOMEN  Final   Special Requests BOTTLES DRAWN AEROBIC AND ANAEROBIC  Final   Culture   Final    NO GROWTH 3 DAYS Performed at [facility], [address] [place], [place] [address]    Report Status PENDING  Incomplete  Gram stain     Status: None   Collection Time: 01/22/24 11:01 AM   Specimen: Fluid  Result Value Ref Range Status   Specimen Description FLUID PERITONEAL ABDOMEN  Final   Special Requests NONE  Final   Gram Stain   Final    FEW WBC PRESENT, PREDOMINANTLY MONONUCLEAR NO ORGANISMS SEEN Performed at [facility], [address], [place], [place] [address]    Report Status 01/22/2024 FINAL  Final    Radiology Reports  CT ANGIO ABD/PELVIS BRTO Result Date: 01/14/2024 CLINICAL DATA:  74 year old with NASH cirrhosis and status post TIPS procedure on 12/14/2023. Recent duplex image demonstrates an occluded TIPS stent. Evaluate portal vein thrombus. EXAM: CTA ABDOMEN AND PELVIS WITHOUT AND WITH CONTRAST TECHNIQUE: Multidetector CT imaging of the abdomen and pelvis was performed using the standard protocol during bolus administration of intravenous contrast. Multiplanar reconstructed images and MIPs were obtained and reviewed to evaluate the vascular anatomy. RADIATION DOSE REDUCTION: This exam was performed according to the departmental dose-optimization program  which includes automated exposure control, adjustment of the mA and/or kV according to patient size and/or use of iterative reconstruction technique. CONTRAST:  OMNIPAQUE  IOHEXOL  350 MG/ML SOLN COMPARISON:  Liver duplex 01/22/2024 and TIPS procedure 12/14/2023 and CTA abdomen and pelvis 11/17/2023 FINDINGS: VASCULAR Aorta: Atherosclerotic disease in the abdominal aorta without aneurysm, dissection or significant stenosis. Celiac: Patent without evidence of aneurysm, dissection, vasculitis or significant stenosis. SMA: Patent without evidence of aneurysm, dissection, vasculitis or significant stenosis. Renals: Both renal arteries are patent without evidence of aneurysm, dissection, vasculitis, fibromuscular dysplasia or significant stenosis. IMA: Patent without evidence of aneurysm, dissection, vasculitis or significant stenosis. Inflow: Patent without evidence of aneurysm, dissection, vasculitis or significant stenosis. Proximal Outflow: Proximal femoral arteries are patent bilaterally. Veins: Gas and high-dense material in the TIPS stent on the non contrast images and most likely associated with recent attempted TIPS revision. TIPS stent is likely occluded. Difficult to evaluate the main portal vein due to artifact from the embolization coils in the left gastric vein. However, there is no definite flow in the main portal vein. No significant flow identified in the left portal vein. Limited evaluation of the right portal vein. Multiple embolization coils in left gastric vein. Again noted are very large gastroesophageal varices around the distal esophagus and gastric cardia region. Again noted is gas within some of the esophageal varices. Thrombosis involving the main superior mesenteric vein on image 43/5 extending into 2 main branches. In addition, there is thrombosis of the proximal IMV on image 38/5. Portal confluence appears to be thrombosed. Central or proximal aspect of the splenic vein is also  thrombosed. Distal splenic vein near the hilum is patent. Main iliac veins and IVC are patent. Bilateral renal veins are patent. There is no significant gastrorenal shunt. TIPS stent involves the middle hepatic vein. This appears to be flow in the left and right hepatic veins. Review  of the MIP images confirms the above findings. NON-VASCULAR Lower chest: Large right pleural effusion with compressive atelectasis in the right lower lobe. Hepatobiliary: Liver is small and very nodular. Findings compatible with cirrhosis. Mild distention of the gallbladder. Difficult to exclude small stones or sludge in the base of the gallbladder. No biliary dilatation. Pancreas: New edema surrounding the pancreas and throughout the central abdominal mesentery. No evidence for pancreatic duct dilatation. Spleen: Spleen is large for size. Again noted is arterial enhancing structure along the inferior aspect of the spleen on image 106/4 that measures 1.3 cm and this is indeterminate but likely an incidental finding. There are new low-density areas involving the posterior aspect of the spleen on image 40/5 that could be related to small splenic infarcts. Adrenals/Urinary Tract: Adrenal glands within normal limits. There is contrast within the collecting system on the non contrast images related to the recent interventional procedure. No hydronephrosis. Bilateral renal cysts. No suspicious renal lesion. Bladder is mildly distended. Small diverticulum along the left posterior aspect of the bladder. Stomach/Bowel: Diverticulosis involving the sigmoid colon. Mild wall thickening involving loops of small bowel in the left lower quadrant on image 64/5. Diffuse mesenteric edema around the pancreas and central aspect of the mesentery has increased since 11/17/2023. There is a small periumbilical hernia containing a small portion of the small bowel. Lymphatic: No significant abdominal or pelvic lymph node enlargement. Reproductive: Prostate is  unremarkable. Other: Bilateral inguinal hernias. Left inguinal hernia contains fat. There is ascites extending into the right inguinal hernia. Small periumbilical hernia containing a loop of small bowel. Small volume of ascites in the abdomen and pelvis. Some of the ascites may be mildly complex or loculated. Musculoskeletal: Disc space narrowing at L5-S1. No acute bone abnormality. IMPRESSION: VASCULAR 1. Thrombosis of the main portal vein, proximal superior mesenteric vein, proximal inferior mesenteric vein and the central aspect of the splenic vein. TIPS stent is thrombosed. Limited evaluation of the intrahepatic portal veins. 2. Large gastroesophageal varices. Prior coil embolization of the left gastric vein. 3.  Aortic Atherosclerosis (ICD10-I70.0). NON-VASCULAR 1. Persistent large right pleural effusion. Findings are compatible with a hepatic hydrothorax. 2. Cirrhosis with portal hypertension. 3. New low-density areas along the posterior and inferior aspect of the spleen. These low-density areas could be related to small infarcts. 4. Increased mesenteric edema particularly in the upper abdomen around the pancreas and near the root of the mesentery. Findings are likely associated with thrombosis involving the portal venous system and proximal mesenteric veins. 5. Bilateral inguinal hernias.  Small umbilical hernia. 6. Small volume of abdominal and pelvic ascites. Some of the ascites may be loculated. 7. Mild wall thickening involving loops of small bowel in the left lower quadrant. Findings are nonspecific but could be associated with venous congestion from the mesenteric venous thrombosis. Electronically Signed   By: Juliene Balder M.D.   On: 01/19/2024 09:11      Signature  -   Lavada Stank M.D on 02/04/2024 at 9:49 AM   -  To page go to [email or web address]      "

## 2024-01-25 NOTE — Procedures (Signed)
Arterial Catheter Insertion Procedure Note  David Esparza  [id number]  1950/10/03  Date:02/06/2024  Time:11:56 PM    Provider Performing: Norleen JONETTA Cedar    Procedure: Insertion of Arterial Line (63379) with US  guidance (23062)   Indication(s) Blood pressure monitoring and/or need for frequent ABGs  Consent Unable to obtain consent due to emergent nature of procedure.  Anesthesia None   Time Out Verified patient identification, verified procedure, site/side was marked, verified correct patient position, special equipment/implants available, medications/allergies/relevant history reviewed, required imaging and test results available.   Sterile Technique Maximal sterile technique including full sterile barrier drape, hand hygiene, sterile gown, sterile gloves, mask, hair covering, sterile ultrasound probe cover (if used).   Procedure Description Area of catheter insertion was cleaned with chlorhexidine  and draped in sterile fashion. With real-time ultrasound guidance an arterial catheter was placed into the right femoral artery.  Appropriate arterial tracings confirmed on monitor.     Complications/Tolerance None; patient tolerated the procedure well.   EBL Minimal   Specimen(s) None  JD Cedar RIGGERS [facility] 01/30/2024, 11:56 PM  Please see [email or web address] for pager details.  From 7A-7P if no response, please call [phone]. After hours, please call ELink [phone].

## 2024-01-25 NOTE — Progress Notes (Signed)
OT Cancellation Note  Patient Details Name: David Esparza MRN: [id number] DOB: 1950-02-20   Cancelled Treatment:    Reason Eval/Treat Not Completed: Patient declined, no reason specified Pt reported recently receiving pain meds and declined to work with OT, citing preference to sleep at this time. Will follow up as able.  Shenaya Lebo 01/19/2024, 8:15 AM

## 2024-01-25 NOTE — Consult Note (Signed)
 "  NAME:  David Esparza, MRN:  [id number], DOB:  30-Mar-1950, LOS: 9 ADMISSION DATE:  01/16/2024, CONSULTATION DATE:  01/29/2024 REFERRING MD:  Dennise SQUIBB., CHIEF COMPLAINT:  cirrhosis s/p TIPS   History of Present Illness:  David Esparza is a 74 year old make with past medical history significant for cirrhosis s/p TIPS 01/04/24, two-paracenteses this admission with fluid studies c/w SBP treated with antibiotics, TIPS occlusion 01/22/24, with TIPS revision attempted on 01/24/24 and ultimately aborted d/t occlusion, however did have paracentesis with 4L removed, who will undergo thrombolysis for TIPS and PV occlusion with IR today. PCCM consulted for ICU admission post-procedure.  Pertinent Medical History:   Past Medical History:  Diagnosis Date   Abnormal LFTs 09/06/2011   Acute bronchitis 08/16/2010   Anxiety 09/06/2011   Chicken pox as a child   Cirrhosis (HCC)    Cutaneous skin tags 07/27/2016   Diabetes mellitus without complication (HCC)    pt denies   Fatigue    Gout    Gout    Grief reaction 09/09/2012   Hyperglycemia 09/06/2011   Hyperlipidemia 09/06/2011   Hypertension    Insomnia 08/16/2010   Knee pain, acute 02/28/2012   Liver cirrhosis secondary to NASH (HCC)    Low testosterone  02/28/2012   Measles as a child   Mumps as a child   Nocturia 08/16/2010   Obese    Preventative health care 09/06/2011   Sleep apnea 09/06/2011   Unspecified vitamin D  deficiency 02/28/2012   Significant Hospital Events: Including procedures, antibiotic start and stop dates in addition to other pertinent events   1/6>> CXR: Interval worsening/opacification over mid/right lower lung suggestive of moderate-sized pleural effusion. CT head: No acute intracranial abnormality. US  ascites: Large volume ascites. US  liver Doppler: Moderate ascites, TIPS stent in place, hepatic cirrhosis. 1/7>> IR abdomen US  limited: No significant pocket for paracentesis 1/8>> US  renal: No hydronephrosis 1/8>> CXR:  Persistent large right pleural effusion. 1/12.  Repeat abdominal ultrasound 1. No color flow within the TIPS, with monophasic nondirectional flow in the region of the TIPS, concerning for TIPS dysfunction or occlusion. 2. Small volume abdominal ascites. Repeat paracentesis with 100 cc of fluid removed 1/14 2026 by IR.  Aborted TIPS revision procedure. 1/14. By IR THERAPEUTIC PARACENTESIS, 4 L of ascitic fluid removed. 1/15: IR thrombolysis for TIPS/PV occlusion  Interim History / Subjective:  PCCM consulted for ICU admission post-catheter-directed thrombolysis  Objective    Blood pressure 101/63, pulse 68, temperature (!) 97.5 F (36.4 C), temperature source Oral, resp. rate (!) 23, height 5' 7 (1.702 m), weight 105 kg, SpO2 97%.        Intake/Output Summary (Last 24 hours) at 01/19/2024 1306 Last data filed at 01/29/2024 9360 Gross per 24 hour  Intake 80.87 ml  Output 1630 ml  Net -1549.13 ml   Filed Weights   01/16/24 1031  Weight: 105 kg    Examination: General: acute on chronically ill elderly male lying in bed post-procedure, in NAD HEENT: AT/[place], PERRL, 3mm bilaterally, RIJ catheter infusing TPA Pulm: normal inspiratory and expiratory effort on RA CV: RRR, no m/g/r GI: soft, rounded, mild tenderness to palpation Neuro: A&O x3, no focal deficits   Resolved Problem List:   Assessment and Plan:   Decompensated MASH cirrhosis with refractory ascites Hepatic hydrothorax SBP Hepatic encephalopathy -see event log above -GI following -s/p IR for catheter-directed thrombolysis for TIPS and PV occlusion -Via RIJ double-lumen catheter: Each infusion catheter will receive 1 mg/hr tPA, x 12  hrs, for a total of 24 mg tPA over 12 hrs  -Heparin  gtt post-IR -q6h H/h, fibrinogen , and heparin  levels  -Monitor for signs of active bleeding -Bed rest while catheters in place -Trend labs -Continue abx for SBP; Rocephin  -Continue rifaximin /lactulose  -Monitor ammonia  UGI  bleed-stable Hypotension likely volume loss -GI following -Prev treated with octreotide ; now off -Cont PPI -Cont midodrine  10mg  tid  Anemia of chronic disease Acute on chronic thrombocytopenia d/t hepatic dysfunction-baseline 63k (08/03/23) -Heme consulted-rec PLT transfusion <20k or <50k with active bleeding -Monitor cbc  Nonoliguric AKI; likely low flow state/poor intake/drug-related -Nephrology following; managing diuretics; okay to use for vol status -sCr improving since admission with albumin /fluids -Avoid nephrotoxic agents  FEN -NPO; resume diet when stable post-IR and when bed rest order complete -Monitor and replace electrolytes as indicated  Labs:  CBC: Recent Labs  Lab 01/21/24 1155 01/21/24 2117 01/22/24 1232 01/23/24 0309 01/23/24 1752 01/24/24 0243 01/24/24 2022 01/29/2024 0237  WBC 9.0 10.9* 11.3* 10.3  --  13.5* 16.4* 16.2*  NEUTROABS 8.1* 9.5* 9.8*  --   --  11.7*  --  13.5*  HGB 8.6* 8.3* 8.8* 9.1*  --  8.6* 8.4* 8.3*  HCT 25.0* 24.4* 25.4* 26.2*  --  24.6* 24.5* 23.9*  MCV 105.9* 105.2* 105.4* 104.8*  --  105.6* 105.2* 104.8*  PLT 38* 47* 44* 26* 52* 46* 56* 59*    Basic Metabolic Panel: Recent Labs  Lab 01/21/24 0348 01/22/24 0740 01/23/24 0309 01/24/24 0243 01/22/2024 0237  NA 145 146* 142 140 138  K 4.6 3.9 4.0 3.6 3.7  CL 109 109 107 106 105  CO2 23 24 25 24 24   GLUCOSE 148* 159* 139* 155* 176*  BUN 57* 75* 68* 57* 48*  CREATININE 1.71* 1.90* 1.87* 1.69* 1.69*  CALCIUM  8.5* 8.5* 8.4* 8.3* 8.1*  MG  --  2.4 2.4 2.3 2.4   GFR: Estimated Creatinine Clearance: 44.3 mL/min (A) (by C-G formula based on SCr of 1.69 mg/dL (H)). Recent Labs  Lab 01/23/24 0309 01/24/24 0243 01/24/24 2022 02/02/2024 0237  WBC 10.3 13.5* 16.4* 16.2*    Liver Function Tests: Recent Labs  Lab 01/22/24 0740 01/23/24 0309 01/24/24 0243 02/02/2024 0237  AST 18 26 18 19   ALT 11 11 10 10   ALKPHOS 58 55 59 73  BILITOT 3.0* 2.1* 2.0* 1.7*  PROT 5.9* 5.8* 5.7*  5.4*  ALBUMIN  3.7 3.5 3.4* 3.2*   No results for input(s): LIPASE, AMYLASE in the last 168 hours. Recent Labs  Lab 01/24/24 0900  AMMONIA 38*    ABG    Component Value Date/Time   PHART 7.427 12/14/2023 1537   PCO2ART 40.1 12/14/2023 1537   PO2ART 71 (L) 12/14/2023 1537   HCO3 26.4 12/14/2023 1537   TCO2 28 12/14/2023 1537   O2SAT 94 12/14/2023 1537     Coagulation Profile: Recent Labs  Lab 01/20/24 0446 01/22/24 0508 01/23/24 0309 01/23/24 1752 01/24/24 0243  INR 2.1* 2.8* 2.3* 2.0* 1.9*    Cardiac Enzymes: No results for input(s): CKTOTAL, CKMB, CKMBINDEX, TROPONINI in the last 168 hours.  HbA1C: Hgb A1c MFr Bld  Date/Time Value Ref Range Status  12/14/2023 06:21 PM 4.9 4.8 - 5.6 % Final    Comment:    (NOTE) Diagnosis of Diabetes The following HbA1c ranges recommended by the American Diabetes Association (ADA) may be used as an aid in the diagnosis of diabetes mellitus.  Hemoglobin             Suggested  A1C NGSP%              Diagnosis  <5.7                   Non Diabetic  5.7-6.4                Pre-Diabetic  >6.4                   Diabetic  <7.0                   Glycemic control for                       adults with diabetes.    01/27/2023 09:00 AM 5.2 4.8 - 5.6 % Final    Comment:    (NOTE) Pre diabetes:          5.7%-6.4%  Diabetes:              >6.4%  Glycemic control for   <7.0% adults with diabetes    CBG: No results for input(s): GLUCAP in the last 168 hours.  Review of Systems:   Review of systems completed with pertinent positives/negatives outlined in above HPI. Past Medical History:  He,  has a past medical history of Abnormal LFTs (09/06/2011), Acute bronchitis (08/16/2010), Anxiety (09/06/2011), Chicken pox (as a child), Cirrhosis (HCC), Cutaneous skin tags (07/27/2016), Diabetes mellitus without complication (HCC), Fatigue, Gout, Gout, Grief reaction (09/09/2012), Hyperglycemia (09/06/2011), Hyperlipidemia  (09/06/2011), Hypertension, Insomnia (08/16/2010), Knee pain, acute (02/28/2012), Liver cirrhosis secondary to NASH (HCC), Low testosterone  (02/28/2012), Measles (as a child), Mumps (as a child), Nocturia (08/16/2010), Obese, Preventative health care (09/06/2011), Sleep apnea (09/06/2011), and Unspecified vitamin D  deficiency (02/28/2012).   Surgical History:   Past Surgical History:  Procedure Laterality Date   BACK SURGERY     COLONOSCOPY  09/16/2010   Dr. Debrah: moderate diverticulosis sigmoid and descending colon, o/w normal: repeat 10 yrs.   intestines sewn  74 yrs old   shot once made 6 holes   IR ANGIOGRAM SELECTIVE EACH ADDITIONAL VESSEL  12/14/2023   IR EMBO ARTERIAL NOT HEMORR HEMANG INC GUIDE ROADMAPPING  12/14/2023   IR PARACENTESIS  12/22/2023   IR PARACENTESIS  01/22/2024   IR RADIOLOGIST EVAL & MGMT  11/15/2023   IR THORACENTESIS RIGHT ASP PLEURAL SPACE W/IMG GUIDE  12/04/2023   IR THORACENTESIS RIGHT ASP PLEURAL SPACE W/IMG GUIDE  12/14/2023   IR TIPS  12/14/2023   IR US  GUIDE VASC ACCESS RIGHT  12/14/2023   IR US  GUIDE VASC ACCESS RIGHT  12/14/2023   TIPS PROCEDURE N/A 12/14/2023   Procedure: INSERTION, SHUNT, INTRAHEPATIC PORTOSYSTEMIC, TRANSJUGULAR;  Surgeon: Jennefer Ester PARAS, MD;  Location: [facility] OR;  Service: Radiology;  Laterality: N/A;   TONSILLECTOMY  as a child   TONSILLECTOMY     UMBILICAL HERNIA REPAIR N/A 01/30/2023   Procedure: OPEN UMBILICAL HERNIA REPAIR;  Surgeon: Belinda Cough, MD;  Location: [facility] OR;  Service: General;  Laterality: N/A;   VASECTOMY  74 yrs old    Social History:   reports that he quit smoking about 55 years ago. His smoking use included cigarettes. He has never used smokeless tobacco. He reports that he does not currently use alcohol. He reports that he does not use drugs.   Family History:  His family history includes Cancer in his maternal grandmother; Depression in his daughter; Diabetes in his maternal grandfather and mother; Esophageal  cancer in  his maternal grandmother; Yvone' disease in his daughter; Heart disease in his maternal grandfather. There is no history of Colon polyps, Colon cancer, Rectal cancer, or Stomach cancer.   Allergies Allergies[1]   Home Medications  Prior to Admission medications  Medication Sig Start Date End Date Taking? Authorizing Provider  albuterol  (VENTOLIN  HFA) 108 (90 Base) MCG/ACT inhaler Inhale 2 puffs into the lungs every 6 (six) hours as needed for wheezing or shortness of breath. 08/22/23  Yes Frann Mabel Mt, DO  carvedilol  (COREG ) 12.5 MG tablet Take 12.5 mg by mouth 2 (two) times daily with a meal.   Yes [provider]  furosemide  (LASIX ) 40 MG tablet Take 1 tablet (40 mg total) by mouth daily. Patient taking differently: Take 40-80 mg by mouth See admin instructions. Take 80 mg in the morning by mouth and 40 mg in the afternoon by mouth 12/17/23  Yes Gonfa, Mignon DASEN, MD  lactulose  (CHRONULAC ) 10 GM/15ML solution Take 20 gm (30 ml) two to three times a day for a goal of 2-3 bowel movements a day Patient taking differently: Take 10 g by mouth daily. Take 20 gm (30 ml) two to three times a day for a goal of 2-3 bowel movements a day 12/16/23  Yes Gonfa, Taye T, MD  lisinopril  (ZESTRIL ) 2.5 MG tablet Take 2.5 mg by mouth daily.   Yes [provider]  spironolactone  (ALDACTONE ) 50 MG tablet Take 1 tablet (50 mg total) by mouth daily. 12/16/23 12/15/24 Yes Kathrin Mignon DASEN, MD     Critical care time: 33 minutes   Sayvon Arterberry, DNP, AGACNP-BC [facility]  Please see [email or web address] for pager details.  From 7A-7P if no response, please call [phone]. After hours, please call ELink [phone].     [1] No Known Allergies  "

## 2024-01-25 NOTE — Progress Notes (Signed)
Admit: 01/16/2024 LOS: 9  38M with advanced cirrhosis including large volume ascites status post TIPS 12/2023, recurrent encephalopathy, history of varices who has a stuttering AKI.   Subjective:  No interval events, stable GFR, making urine GI/IR discussing issues surrounding TIPS In good spirits this morning, no complaints  01/14 0701 - 01/15 0700 In: 580.9 [P.O.:500; I.V.:80.9] Out: 1630 [Urine:1630]  Filed Weights   01/16/24 1031  Weight: 105 kg    Scheduled Meds:  Chlorhexidine  Gluconate Cloth  6 each Topical Daily   feeding supplement  237 mL Oral TID BM   lactulose   20 g Oral BID   lidocaine  (PF)  10 mL Intradermal Once   lidocaine  (PF)  30 mL Intradermal Once   midodrine   10 mg Oral TID WC   multivitamin with minerals  1 tablet Oral Daily   pantoprazole  (PROTONIX ) IV  40 mg Intravenous Q12H   rifaximin   550 mg Oral BID   Continuous Infusions:  cefTRIAXone  (ROCEPHIN )  IV 2 g (01/24/24 1458)   PRN Meds:.albuterol , hydrALAZINE , ondansetron  (ZOFRAN ) IV, traMADol   Current Labs: reviewed    Physical Exam:  Blood pressure 101/63, pulse 68, temperature (!) 97.5 F (36.4 C), temperature source Oral, resp. rate (!) 23, height 5' 7 (1.702 m), weight 105 kg, SpO2 97%. GEN: Elderly male, mildly confused ENT: NCAT EYES: EOMI CV: Regular, no rub PULM: Diminished in the bases ABD: Protuberant, soft SKIN: No petechia or purpura EXT: No significant edema  A Nonoliguric AKI with normal baseline GFR: Likely multifactorial and included presentation with component of HRS.  Slowly improving    imaging has been reassuring.  Would not resume any diuretics at the current time.  Continue supportive care Decompensated cirrhosis presenting with encephalopathy, having GI bleed: Gastroenterology is following.  On octreotide .  Requiring midodrine .  Status post TIPS with occlusion. Upper GI bleed on octreotide  with GI following  P Ca use diuretics as needed for volume status. Continue  supportive care No additional recommendations at the current ; will arrange for outpatient follow-up.  At the current time we will sign off. Daily weights, Daily Renal Panel, Strict I/Os, Avoid nephrotoxins (NSAIDs, judicious IV Contrast)  Medication Issues; Preferred narcotic agents for pain control are hydromorphone, fentanyl , and methadone. Morphine  should not be used.  Baclofen should be avoided Avoid oral sodium phosphate  and magnesium citrate based laxatives / bowel preps    Bernardino Gasman MD 01/31/2024, 10:52 AM  Recent Labs  Lab 01/23/24 0309 01/24/24 0243 01/31/2024 0237  NA 142 140 138  K 4.0 3.6 3.7  CL 107 106 105  CO2 25 24 24   GLUCOSE 139* 155* 176*  BUN 68* 57* 48*  CREATININE 1.87* 1.69* 1.69*  CALCIUM  8.4* 8.3* 8.1*   Recent Labs  Lab 01/22/24 1232 01/23/24 0309 01/24/24 0243 01/24/24 2022 01/31/2024 0237  WBC 11.3*   < > 13.5* 16.4* 16.2*  NEUTROABS 9.8*  --  11.7*  --  13.5*  HGB 8.8*   < > 8.6* 8.4* 8.3*  HCT 25.4*   < > 24.6* 24.5* 23.9*  MCV 105.4*   < > 105.6* 105.2* 104.8*  PLT 44*   < > 46* 56* 59*   < > = values in this interval not displayed.

## 2024-01-25 NOTE — Plan of Care (Signed)
" °  Problem: Education: Goal: Knowledge of General Education information will improve Description: Including pain rating scale, medication(s)/side effects and non-pharmacologic comfort measures Outcome: Progressing   Problem: Health Behavior/Discharge Planning: Goal: Ability to manage health-related needs will improve Outcome: Progressing   Problem: Clinical Measurements: Goal: Ability to maintain clinical measurements within normal limits will improve Outcome: Progressing Goal: Will remain free from infection Outcome: Progressing Goal: Diagnostic test results will improve Outcome: Progressing Goal: Respiratory complications will improve Outcome: Progressing Goal: Cardiovascular complication will be avoided Outcome: Progressing   Problem: Coping: Goal: Level of anxiety will decrease Outcome: Progressing   Problem: Elimination: Goal: Will not experience complications related to bowel motility Outcome: Progressing Goal: Will not experience complications related to urinary retention Outcome: Progressing   Problem: Elimination: Goal: Will not experience complications related to urinary retention Outcome: Progressing   Problem: Pain Managment: Goal: General experience of comfort will improve and/or be controlled Outcome: Progressing   Problem: Safety: Goal: Ability to remain free from injury will improve Outcome: Progressing   Problem: Skin Integrity: Goal: Risk for impaired skin integrity will decrease Outcome: Progressing   "

## 2024-01-25 NOTE — Procedures (Signed)
 Central Venous Catheter Insertion Procedure Note  David Esparza  [id number]  November 02, 1950  Date:02/08/2024  Time:11:55 PM   Provider Performing:Jaelyn Bourgoin D Emilio   Procedure: Insertion of Non-tunneled Central Venous Catheter(36556) with US  guidance (23062)   Indication(s) Medication administration  Consent Unable to obtain consent due to emergent nature of procedure.  Anesthesia Topical only with 1% lidocaine    Timeout Verified patient identification, verified procedure, site/side was marked, verified correct patient position, special equipment/implants available, medications/allergies/relevant history reviewed, required imaging and test results available.  Sterile Technique Maximal sterile technique including full sterile barrier drape, hand hygiene, sterile gown, sterile gloves, mask, hair covering, sterile ultrasound probe cover (if used).  Procedure Description Area of catheter insertion was cleaned with chlorhexidine  and draped in sterile fashion.  With real-time ultrasound guidance a central venous catheter was placed into the right femoral vein. Nonpulsatile blood flow and easy flushing noted in all ports.  The catheter was sutured in place and sterile dressing applied.  Complications/Tolerance None; patient tolerated the procedure well. Chest X-ray is ordered to verify placement for internal jugular or subclavian cannulation.   Chest x-ray is not ordered for femoral cannulation.  EBL Minimal  Specimen(s) None  JD Emilio RIGGERS [facility] 02/08/2024, 11:55 PM  Please see [email or web address] for pager details.  From 7A-7P if no response, please call [phone]. After hours, please call ELink [phone].

## 2024-01-25 NOTE — Progress Notes (Signed)
Intraosseous Needle Insertion Procedure Note    Date:02/09/2024  Time:11:47 PM   Provider Performing:Asherah Lavoy JAYSON Sharps   Procedure: Insertion Intraosseous (63319)  Indication(s) Medication administration and Difficult access  Consent Unable to obtain consent due to emergent nature of procedure.  Anesthesia None   Timeout Verified patient identification, verified procedure, site/side was marked, verified correct patient position, special equipment/implants available, medications/allergies/relevant history reviewed, required imaging and test results available.  Procedure Description Area of needle insertion was cleaned with chlorhexidine . Intraosseous needle was placed into the right tibia. Bone marrow was aspirated and site easily flushed. The needle was secured in place and dressing applied.  Complications/Tolerance None; patient tolerated the procedure well.  EBL Minimal   Sherlean Sharps AGACNP-BC    [facility] 01/15/2024, 11:48 PM  Please see [email or web address] for pager details.  From 7A-7P if no response, please call [phone]. After hours, please call ELink [phone].

## 2024-01-25 NOTE — Progress Notes (Signed)
 Critical care attending attestation note:  Patient seen and examined and relevant ancillary tests reviewed.  I agree with the assessment and plan of care as outlined by Dagenhart, Jamie H, NP    Synopsis of assessment and plan:  74 year old male with history of cirrhosis s/p TIPS 01/04/2024, SBP, TIPS occlusion 01/22/2024 with revision attempted on 01/24/2024, patient underwent portal venography & catheter directed thrombolysis. Postprocedure patient was brought to the ICU for close monitoring  Postprocedure patient looks well, mentating fine, conversational, perfusing well blood pressure stable.  Decompensated liver cirrhosis SBP Hepatic encephalopathy Nonoliguric AKI Upper GI-stable on octreotide  and Protonix     Plan - Continue catheter directed thrombolysis per IR Each infusion cath will receive 1 mg/hr tPA for 12 hours(total of 24 mg) - Serial labs q6hs-will closely monitor fibrinogen  - Heparin  GTT - Likely planned for TIPS revision over the weekend - Continue Protonix  - Continue antibiotics for SBP - Daily ammonia - Continue lactulose  and rifaximin  aim for Bms>3 - Strict I&O's      CRITICAL CARE Performed by: Lenny Drought, MD  [facility] Prefer epic messenger for cross cover needs   My critical care time: 30 minutes  Critical care time was exclusive of separately billable procedures and treating other patients.  Critical care was necessary to treat or prevent imminent or life-threatening deterioration.  Critical care was time spent personally by me on the following activities: development of treatment plan with patient and/or surrogate as well as nursing, discussions with consultants, evaluation of patient's response to treatment, examination of patient, obtaining history from patient or surrogate, ordering and performing treatments and interventions, ordering and review of laboratory studies, ordering and review of radiographic studies,  pulse oximetry, re-evaluation of patient's condition and participation in multidisciplinary rounds.

## 2024-01-25 NOTE — Procedures (Signed)
Intubation Procedure Note  GRIER VU  [id number]  January 29, 1950  Date:02/07/2024  Time:11:36 PM   Provider Performing:Mariene Dickerman V. Lakethia Coppess    Procedure: Intubation (31500)  Indication(s) Respiratory Failure  Consent Unable to obtain consent due to emergent nature of procedure.   Anesthesia Etomidate    Time Out Verified patient identification, verified procedure, site/side was marked, verified correct patient position, special equipment/implants available, medications/allergies/relevant history reviewed, required imaging and test results available.   Sterile Technique Usual hand hygeine, masks, and gloves were used   Procedure Description Patient positioned in bed supine.  Sedation given as noted above.  Patient was intubated with endotracheal tube using Glidescope.  View was Grade 2 only posterior commissure . COPious hematemesis & pooling of blood requiring continuous suction Number of attempts was 1.  Colorimetric CO2 detector was consistent with tracheal placement.   Complications/Tolerance None; patient tolerated the procedure well. Chest X-ray is ordered to verify placement.   EBL Minimal   Specimen(s) None

## 2024-01-25 NOTE — Progress Notes (Signed)
 PHARMACY - ANTICOAGULATION CONSULT NOTE  Pharmacy Consult for Heparin  Indication: portal vein thrombosis, also receiving catheter directed lytic therapy  Allergies[1]  Patient Measurements: Height: 5' 7 (170.2 cm) Weight: 105 kg (231 lb 7.7 oz) IBW/kg (Calculated) : 66.1 HEPARIN  DW (KG): 89.3  Vital Signs: Temp: 97.5 F (36.4 C) (01/15 0800) Temp Source: Oral (01/15 0800)  Labs: Recent Labs    01/23/24 0309 01/23/24 1752 01/24/24 0243 01/24/24 2022 02/08/2024 0237  HGB 9.1*  --  8.6* 8.4* 8.3*  HCT 26.2*  --  24.6* 24.5* 23.9*  PLT 26* 52* 46* 56* 59*  APTT  --  32  --   --   --   LABPROT 26.4* 23.7* 23.0*  --   --   INR 2.3* 2.0* 1.9*  --   --   CREATININE 1.87*  --  1.69*  --  1.69*    Estimated Creatinine Clearance: 44.3 mL/min (A) (by C-G formula based on SCr of 1.69 mg/dL (H)).   Medical History: Past Medical History:  Diagnosis Date   Abnormal LFTs 09/06/2011   Acute bronchitis 08/16/2010   Anxiety 09/06/2011   Chicken pox as a child   Cirrhosis (HCC)    Cutaneous skin tags 07/27/2016   Diabetes mellitus without complication (HCC)    pt denies   Fatigue    Gout    Gout    Grief reaction 09/09/2012   Hyperglycemia 09/06/2011   Hyperlipidemia 09/06/2011   Hypertension    Insomnia 08/16/2010   Knee pain, acute 02/28/2012   Liver cirrhosis secondary to NASH (HCC)    Low testosterone  02/28/2012   Measles as a child   Mumps as a child   Nocturia 08/16/2010   Obese    Preventative health care 09/06/2011   Sleep apnea 09/06/2011   Unspecified vitamin D  deficiency 02/28/2012    Medications:  Scheduled:   Chlorhexidine  Gluconate Cloth  6 each Topical Daily   feeding supplement  237 mL Oral TID BM   lactulose   20 g Oral BID   lidocaine  (PF)  10 mL Intradermal Once   lidocaine  (PF)  30 mL Intradermal Once   midodrine   10 mg Oral TID WC   multivitamin with minerals  1 tablet Oral Daily   pantoprazole  (PROTONIX ) IV  40 mg Intravenous Q12H    rifaximin   550 mg Oral BID   sodium chloride  flush  3 mL Intravenous Q12H   Infusions:   [START ON 01/26/2024] sodium chloride      [START ON 01/26/2024] sodium chloride      sodium chloride      alteplase  (tPA/ACTIVASE ) 12 mg in sodium chloride  0.9 % 238 mL     alteplase  (tPA/ACTIVASE ) 12 mg in sodium chloride  0.9 % 238 mL     cefTRIAXone  (ROCEPHIN )  IV 2 g (01/24/24 1458)    Assessment: 74 yo M presented with hepatic encephalopathy and uGIB in the setting of MASH / cirrhosis.  Pt s/p TIPS procedure 12/14/23 with plan for revision 01/24/24.  Unable to complete revision due to hepatic vein thrombosis.  Catheter placed for catheter directed thrombolytic with alteplase .  Pharmacy consulted to start heparin .  Of note, patient has some AKI with SCr 1.6-1.9 (baseline 1) and thrombocytopenia with PLTC 50s.  Patient has not been receiving anticoagulation due to baseline thrombocytopenia.  Goal of Therapy:  Heparin  level 0.3-0.7 units/ml Monitor platelets by anticoagulation protocol: Yes   Plan:  Start heparin  at 1300 units/hr Check heparin  level in 8 hours. Will need heparin  level and  fibrinogen  q6h while on heparin  and thrombolytic. Will monitor CBC closely.  [employer], Pharm.D., BCPS Clinical Pharmacist Clinical phone for 01/21/2024 from 7:30-3:00 is [phone].  **Pharmacist phone directory can be found on [email or web address] listed under [facility].  01/24/2024 2:37 PM       [1] No Known Allergies

## 2024-01-25 NOTE — Progress Notes (Addendum)
eLink Physician-Brief Progress Note Patient Name: David Esparza DOB: [birth year] MRN: [id number]   Date of Service  01/21/2024  HPI/Events of Note  Notified of hypotension with BP 54/37 --> 62/37, HR 80, RR 12, O2 sats 94%.   Pt with hx of cirrhosis s/p TIPS with occlusion, currently with catheter directed thrombolysis.  Pt also had thoracentesis earlier today removing 1.6L.   Pt received several doses of fentanyl  in IR.  He is drowsy but wakens to stimuli and is oriented.   eICU Interventions  Give 25g IV albumin  now.      Intervention Category Intermediate Interventions: Hypotension - evaluation and management  Shanda Busman 01/18/2024, 9:33 PM  10:13 PM BP remains in the 50s-60s after albumin .  Hgb was stable post- procedure.  Plan> Start on peripheral levophed .  Check CBC, BMP stat, ammonia.  Check venous blood gas.   10:41 PM BP remains low with SBP in the 40s-50s.   Pt is bleeding in the gums.   Plan> Give 1amp epinephrine .  Increase levophed .  Give LR bolus 500cc now.  Hold lytics and heparin  gtt for now.  Insert central line. Ground team notified.  Repeat CBC, BMP, venous blood gas, ammonia.  Blood could not drawn earlier.   10:50 PM Spoke to the patient's wife to notify her that the patient was hypotensive and unresponsive and will be needing intubation.  She will return to the hospital ASAP.   Ground team at the bedside to assess and intubate.

## 2024-01-25 NOTE — Procedures (Signed)
Vascular and Interventional Radiology Procedure Note  Patient: David Esparza DOB: November 07, 1950 Medical Record Number: [id number] Note Date/Time: 01/13/2024 3:29 PM   Performing Physician: Thom Hall, MD Assistant(s): None  Diagnosis: TIPS and PV thrombosis.   Procedure:  PORTAL VENOGRAPHY and THROMBOLYSIS, Catheter-directed   Anesthesia: Conscious Sedation Complications: None Estimated Blood Loss: Minimal Specimens: None  Findings:  - Therapeutic RIGHT thoracentesis with 1.6 L of serous pleural fluid removed. - access via the RIGHT internal jugular vein. - PV thrombus burden at splenic vein and SMV, with successful lytic catheter placement(s).  - SMV Uni-Fuse ; 90 cm infusion and 15 cm total length catheter - Splenic vein Uni-Fuse ; 90 cm infusion and 15 cm total length catheter  Plan: - each infusion catheter will receive 1 mg/hr tPA, x 12 hrs, for a total of 24 mg tPA over 12 hrs - q6h H/h, fibrinogen , and heparin  levels - the sheath will receive 20 mL/hr of NS gtt for KVO - NPO vs Clear liquid diet recommended - Pharmacy [facility] team) consult for heparin  level management.  - Page PCCM and VIR On Call if fibrinogen  <150, severe HA, AMS, or new / acute GIB - Bedrest w HOB as tolerated, while the catheters are in place.  - VIR service to follow and will assess catheter function on rounds in AM.  Final report to follow once all images are reviewed and compared with previous studies.  See detailed dictation with images in PACS. The patient tolerated the procedure well without incident or complication and was returned to ICU in stable condition.    Thom Hall, MD Vascular and Interventional Radiology Specialists [facility] Radiology   Pager. [phone] Clinic. [phone]
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
## 2024-01-26 LAB — BPAM RBC
Blood Product Expiration Date: 202601252359
Blood Product Expiration Date: 202601252359
Blood Product Expiration Date: 202601262359
Blood Product Expiration Date: 202601262359
Blood Product Expiration Date: 202601262359
Blood Product Expiration Date: 202601262359
Blood Product Expiration Date: 202601262359
Blood Product Expiration Date: 202601262359
ISSUE DATE / TIME: 202601152305
ISSUE DATE / TIME: 202601152305
ISSUE DATE / TIME: 202601152305
ISSUE DATE / TIME: 202601152305
ISSUE DATE / TIME: 202601152305
ISSUE DATE / TIME: 202601152305
ISSUE DATE / TIME: 202601152305
ISSUE DATE / TIME: 202601152305
Unit Type and Rh: 5100
Unit Type and Rh: 5100
Unit Type and Rh: 5100
Unit Type and Rh: 5100
Unit Type and Rh: 5100
Unit Type and Rh: 5100
Unit Type and Rh: 5100
Unit Type and Rh: 5100

## 2024-01-26 LAB — PREPARE FRESH FROZEN PLASMA
Unit division: 0
Unit division: 0
Unit division: 0
Unit division: 0
Unit division: 0
Unit division: 0
Unit division: 0

## 2024-01-26 LAB — PREPARE PLATELET PHERESIS: Unit division: 0

## 2024-01-26 LAB — TYPE AND SCREEN
ABO/RH(D): O NEG
Antibody Screen: NEGATIVE
Unit division: 0
Unit division: 0
Unit division: 0
Unit division: 0
Unit division: 0
Unit division: 0
Unit division: 0
Unit division: 0

## 2024-01-26 LAB — BPAM FFP
Blood Product Expiration Date: 202601202359
Blood Product Expiration Date: 202601202359
Blood Product Expiration Date: 202601262359
Blood Product Expiration Date: 202601262359
Blood Product Expiration Date: 202601282359
Blood Product Expiration Date: 202601282359
Blood Product Expiration Date: 202601302359
Blood Product Expiration Date: 202602022359
ISSUE DATE / TIME: 202601152305
ISSUE DATE / TIME: 202601152305
ISSUE DATE / TIME: 202601152305
ISSUE DATE / TIME: 202601152305
ISSUE DATE / TIME: 202601152305
ISSUE DATE / TIME: 202601152305
ISSUE DATE / TIME: 202601152305
ISSUE DATE / TIME: 202601152305
Unit Type and Rh: 6200
Unit Type and Rh: 6200
Unit Type and Rh: 6200
Unit Type and Rh: 6200
Unit Type and Rh: 6200
Unit Type and Rh: 6200
Unit Type and Rh: 6200
Unit Type and Rh: 6200

## 2024-01-26 LAB — GLUCOSE, CAPILLARY: Glucose-Capillary: 141 mg/dL — ABNORMAL HIGH (ref 70–99)

## 2024-01-26 LAB — BPAM PLATELET PHERESIS
Blood Product Expiration Date: 202601162359
ISSUE DATE / TIME: 202601152304
Unit Type and Rh: 6200

## 2024-01-26 LAB — PREPARE CRYOPRECIPITATE: Unit division: 0

## 2024-01-26 LAB — BPAM CRYOPRECIPITATE
Blood Product Expiration Date: 202601192359
ISSUE DATE / TIME: 202601152317
Unit Type and Rh: 5100

## 2024-01-27 LAB — CULTURE, BODY FLUID W GRAM STAIN -BOTTLE: Culture: NO GROWTH

## 2024-02-05 ENCOUNTER — Ambulatory Visit (HOSPITAL_COMMUNITY)

## 2024-02-07 NOTE — Progress Notes (Signed)
 Notified of patient's recent passing after it was noted that his upcoming follow-up visit had been canceled.  Upon review of external medical record patient had developed recurrent right pleural effusion requiring frequent thoracentesis.  He was referred to interventional radiology for TIPS by pulmonology and underwent TIPS creation and left gastric vein embolization on 12/14/2023.  Post TIPS he was hospitalized for 3 days including a night in the ICU due postprocedure hypotension requiring pressor support and development of ascites requiring paracentesis.    He developed recurrent pleural effusion and underwent thoracentesis on 01/05/2024.  Patient presented to the ED on 01/16/2023 with weakness and altered mental status.  He was noted to have encephalopathy, AKI with creatinine of 2.6 on admission.  During his admission he was found to have SBP.  He had 2 episodes of dark emesis on 01/21/2024 with 2 g drop in hemoglobin.  On 01/22/2024 was found to have an occluded TIPS, he underwent TIPS revision on 01/24/2024 and was receiving IV heparin  for thrombolysis.  Following procedure he was admitted to ICU for monitoring and based on notes was stable upon admission to ICU however several hours later developed hypotension with BP 54/37 and was unresponsive.  He had massive hematemesis (7 L of blood), family was notified, and decision was made not to pursue CPR or cardioversion.  Patient expired on Feb 06, 2024.  Sympathy cared sent to patient's family expressing the condolences of the team.

## 2024-02-11 NOTE — Progress Notes (Signed)
 SPIRITUAL CARE AND COUNSELING CONSULT NOTE   VISIT SUMMARY Patient passed away on February 01, 2024. Chaplain confirmed with medical staff. No assistance needed at this time.   SPIRITUAL ENCOUNTER                                                                                                                                                                      Type of Visit: Initial Reason for visit: End-of-life OnCall Visit: No   SPIRITUAL FRAMEWORK      GOALS       INTERVENTIONS        INTERVENTION OUTCOMES      SPIRITUAL CARE PLAN        If immediate needs arise, please contact [facility] 24 hour on call [phone]   Roxanne Slade  01/26/2024 10:19 AM

## 2024-02-11 NOTE — Death Summary Note (Signed)
 "  DEATH SUMMARY   Patient Details  Name: David Esparza MRN: [id number] DOB: 1950-04-01 ERE:Aobuy, Harlene LABOR, MD  Admission/Discharge Information   Admit Date:  02/08/24  Date of Death: Date of Death: 02-17-24  Time of Death: Time of Death: 02/28/55  Length of Stay: 10   Principle Cause of death: Hemorrhagic Shock.  Hospital Diagnoses: Principal Problem:   Hepatic encephalopathy (HCC) Active Problems:   Liver cirrhosis secondary to NASH (HCC)   S/P TIPS (transjugular intrahepatic portosystemic shunt)   AKI (acute kidney injury)   Pleural effusion associated with hepatic disorder   SBP (spontaneous bacterial peritonitis) (HCC)   Upper GI bleed   Hemorrhagic shock (HCC)   Shock [facility])   Hospital Course: 74 year old man with a history of cirrhosis due to NASH status post TIPS procedure on 01/04/2024.  He had SBP and TIPS occlusion on 01/22/2024.  The revision procedure is attempted on 01/24/2024.  Patient underwent portal venography and catheter directed thrombolysis.  Postprocedure patient was brought to ICU for close monitoring.  Patient deteriorated postprocedure.  He had massive hematemesis likely variceal bleeding.  Massive transfusion protocol was initiated and he is transfused 8 units of PRBC and 6 units of FFP.  Patient was intubated and was on vasopressors.  At this stage wife decided DNR.  Patient eventually passed away on 02/17/24 at 23:57.       Procedures: Cordis placement, IO needle placement, arterial line placement, endotracheal intubation  Consultations: Interventional radiology, critical care medicine.  The results of significant diagnostics from this hospitalization (including imaging, microbiology, ancillary and laboratory) are listed below for reference.   Significant Diagnostic Studies: IR TIPS REVISION MOD SED Result Date: 2024/02/17 CLINICAL DATA:  TIPS revision. Briefly, 74 year old male with a history of decompensated MASH cirrhosis and RIGHT hepatic  hydrothorax s/p TIPS and GEV embolization 12/14/2023. Ascites accumulation and encephalopathy suspicious for TIPS malfunction. TIPS Doppler with suspicion for occlusion EXAM: Procedures: 1. PORTAL and HEPATIC VENOGRAPHY 2. *Aborted* TRANSJUGULAR INTRAHEPATIC PORTOSYSTEMIC SHUNT (TIPS) REVISION 3.  THERAPEUTIC PARACENTESIS MEDICATIONS: None. ANESTHESIA/SEDATION: Moderate (conscious) sedation was employed during this procedure. A total of Versed  1.5 mg and Fentanyl  50 mcg was administered intravenously. Moderate Sedation Time: 52 minutes. The patient's level of consciousness and vital signs were monitored continuously by radiology nursing throughout the procedure under my direct supervision. CONTRAST:  60 mL OMNIPAQUE  IOHEXOL  300 MG/ML  SOLN FLUOROSCOPY: Radiation Exposure Index and estimated peak skin dose (PSD); Reference air kerma (RAK), 604.7 mGy. COMPLICATIONS: None immediate. PROCEDURE: Informed written consent was obtained from the patient and/or patient's representative after a thorough discussion of the procedural risks, benefits and alternatives. All questions were addressed. Maximal Sterile Barrier Technique was utilized including caps, mask, sterile gowns, sterile gloves, sterile drape, hand hygiene and skin antiseptic. The skin overlying the RIGHT upper abdominal quadrant as well as the RIGHT neck were prepped and draped in usual sterile fashion. A timeout was performed prior to the initiation of the procedure. PARACENTESIS; Initial ultrasound scanning demonstrates a large amount of ascites within the abdomen. Under direct ultrasound guidance, a 7 Fr pigtail catheter was introduced. An ultrasound image was saved for documentation purposes. Therapeutic paracentesis was performed. PORTAL VENOGRAPHY; 1% Lidocaine  was used for local anesthesia. Ultrasound evaluation showed a patent RIGHT internal jugular vein. Ultrasound image was saved and sent to PACS. Under ultrasound guidance, the right internal jugular  vein was access using a 21-G needle which was dilated and exchanged for a 10 Fr Cook sheath over a guidewire. Under  fluoroscopic guidance, utilizing a 5 Fr C2 catheter and 0.035-inch Bentson guidewire manipulation, the catheter was advanced through the TIPS shunt and into the main portal vein. Digital subtraction venography was performed. The TIPS shunt was occluded and no significant flow through the shunt was identified. Pressure measurements were performed in the portal vein and in the RIGHT atrium. No intervention was performed. Images were reviewed and the procedure was terminated. All wires, catheters and sheaths were removed from the patient. Hemostasis was achieved at the RIGHT neck and RIGHT upper abdominal access sites with manual compression. Dressings were applied. The patient tolerated the procedure well without immediate postprocedural complication. FINDINGS: *TIPS and portal venous thrombosis.  No revision was performed. *Moderate volume intra-abdominal ascites. Ultrasound-guided paracentesis was performed yielding 4.0 L of ascitic fluid. Pre-TIPS revision mean Pressures (mmHg): Right atrium: 24 Portal vein: 32 Portosystemic gradient: 8 IMPRESSION: 1. Aborted revision of transjugular intrahepatic portosystemic shunt (TIPS), secondary to extensive shunt and portal venous thrombosis. 2. Successful ultrasound-guided paracentesis yielding 4.0 L of ascitic fluid. RECOMMENDATIONS: CT BRTO protocol to evaluate extent of portal venous thrombosis, and to assist in definitive procedural planning. Consider initiation of heparin  gtt. Thom Hall, MD [facility] Radiology Electronically Signed   By: Thom Hall M.D.   On: 21-Feb-2024 15:28   IR Paracentesis Result Date: 2024-02-21 CLINICAL DATA:  TIPS revision. Briefly, 74 year old male with a history of decompensated MASH cirrhosis and RIGHT hepatic hydrothorax s/p TIPS and GEV embolization 12/14/2023. Ascites  accumulation and encephalopathy suspicious for TIPS malfunction. TIPS Doppler with suspicion for occlusion EXAM: Procedures: 1. PORTAL and HEPATIC VENOGRAPHY 2. *Aborted* TRANSJUGULAR INTRAHEPATIC PORTOSYSTEMIC SHUNT (TIPS) REVISION 3.  THERAPEUTIC PARACENTESIS MEDICATIONS: None. ANESTHESIA/SEDATION: Moderate (conscious) sedation was employed during this procedure. A total of Versed  1.5 mg and Fentanyl  50 mcg was administered intravenously. Moderate Sedation Time: 52 minutes. The patient's level of consciousness and vital signs were monitored continuously by radiology nursing throughout the procedure under my direct supervision. CONTRAST:  60 mL OMNIPAQUE  IOHEXOL  300 MG/ML  SOLN FLUOROSCOPY: Radiation Exposure Index and estimated peak skin dose (PSD); Reference air kerma (RAK), 604.7 mGy. COMPLICATIONS: None immediate. PROCEDURE: Informed written consent was obtained from the patient and/or patient's representative after a thorough discussion of the procedural risks, benefits and alternatives. All questions were addressed. Maximal Sterile Barrier Technique was utilized including caps, mask, sterile gowns, sterile gloves, sterile drape, hand hygiene and skin antiseptic. The skin overlying the RIGHT upper abdominal quadrant as well as the RIGHT neck were prepped and draped in usual sterile fashion. A timeout was performed prior to the initiation of the procedure. PARACENTESIS; Initial ultrasound scanning demonstrates a large amount of ascites within the abdomen. Under direct ultrasound guidance, a 7 Fr pigtail catheter was introduced. An ultrasound image was saved for documentation purposes. Therapeutic paracentesis was performed. PORTAL VENOGRAPHY; 1% Lidocaine  was used for local anesthesia. Ultrasound evaluation showed a patent RIGHT internal jugular vein. Ultrasound image was saved and sent to PACS. Under ultrasound guidance, the right internal jugular vein was access using a 21-G needle which was dilated and  exchanged for a 10 Fr Cook sheath over a guidewire. Under fluoroscopic guidance, utilizing a 5 Fr C2 catheter and 0.035-inch Bentson guidewire manipulation, the catheter was advanced through the TIPS shunt and into the main portal vein. Digital subtraction venography was performed. The TIPS shunt was occluded and no significant flow through the shunt was identified. Pressure measurements were performed in the portal vein and in the RIGHT atrium.  No intervention was performed. Images were reviewed and the procedure was terminated. All wires, catheters and sheaths were removed from the patient. Hemostasis was achieved at the RIGHT neck and RIGHT upper abdominal access sites with manual compression. Dressings were applied. The patient tolerated the procedure well without immediate postprocedural complication. FINDINGS: *TIPS and portal venous thrombosis.  No revision was performed. *Moderate volume intra-abdominal ascites. Ultrasound-guided paracentesis was performed yielding 4.0 L of ascitic fluid. Pre-TIPS revision mean Pressures (mmHg): Right atrium: 24 Portal vein: 32 Portosystemic gradient: 8 IMPRESSION: 1. Aborted revision of transjugular intrahepatic portosystemic shunt (TIPS), secondary to extensive shunt and portal venous thrombosis. 2. Successful ultrasound-guided paracentesis yielding 4.0 L of ascitic fluid. RECOMMENDATIONS: CT BRTO protocol to evaluate extent of portal venous thrombosis, and to assist in definitive procedural planning. Consider initiation of heparin  gtt. Thom Hall, MD [facility] Radiology Electronically Signed   By: Thom Hall M.D.   On: 02-05-24 15:28   IR US  Guide Vasc Access Right Result Date: Feb 05, 2024 CLINICAL DATA:  TIPS revision. Briefly, 74 year old male with a history of decompensated MASH cirrhosis and RIGHT hepatic hydrothorax s/p TIPS and GEV embolization 12/14/2023. Ascites accumulation and encephalopathy suspicious  for TIPS malfunction. TIPS Doppler with suspicion for occlusion EXAM: Procedures: 1. PORTAL and HEPATIC VENOGRAPHY 2. *Aborted* TRANSJUGULAR INTRAHEPATIC PORTOSYSTEMIC SHUNT (TIPS) REVISION 3.  THERAPEUTIC PARACENTESIS MEDICATIONS: None. ANESTHESIA/SEDATION: Moderate (conscious) sedation was employed during this procedure. A total of Versed  1.5 mg and Fentanyl  50 mcg was administered intravenously. Moderate Sedation Time: 52 minutes. The patient's level of consciousness and vital signs were monitored continuously by radiology nursing throughout the procedure under my direct supervision. CONTRAST:  60 mL OMNIPAQUE  IOHEXOL  300 MG/ML  SOLN FLUOROSCOPY: Radiation Exposure Index and estimated peak skin dose (PSD); Reference air kerma (RAK), 604.7 mGy. COMPLICATIONS: None immediate. PROCEDURE: Informed written consent was obtained from the patient and/or patient's representative after a thorough discussion of the procedural risks, benefits and alternatives. All questions were addressed. Maximal Sterile Barrier Technique was utilized including caps, mask, sterile gowns, sterile gloves, sterile drape, hand hygiene and skin antiseptic. The skin overlying the RIGHT upper abdominal quadrant as well as the RIGHT neck were prepped and draped in usual sterile fashion. A timeout was performed prior to the initiation of the procedure. PARACENTESIS; Initial ultrasound scanning demonstrates a large amount of ascites within the abdomen. Under direct ultrasound guidance, a 7 Fr pigtail catheter was introduced. An ultrasound image was saved for documentation purposes. Therapeutic paracentesis was performed. PORTAL VENOGRAPHY; 1% Lidocaine  was used for local anesthesia. Ultrasound evaluation showed a patent RIGHT internal jugular vein. Ultrasound image was saved and sent to PACS. Under ultrasound guidance, the right internal jugular vein was access using a 21-G needle which was dilated and exchanged for a 10 Fr Cook sheath over a  guidewire. Under fluoroscopic guidance, utilizing a 5 Fr C2 catheter and 0.035-inch Bentson guidewire manipulation, the catheter was advanced through the TIPS shunt and into the main portal vein. Digital subtraction venography was performed. The TIPS shunt was occluded and no significant flow through the shunt was identified. Pressure measurements were performed in the portal vein and in the RIGHT atrium. No intervention was performed. Images were reviewed and the procedure was terminated. All wires, catheters and sheaths were removed from the patient. Hemostasis was achieved at the RIGHT neck and RIGHT upper abdominal access sites with manual compression. Dressings were applied. The patient tolerated the procedure well without immediate postprocedural complication. FINDINGS: *TIPS and portal  venous thrombosis.  No revision was performed. *Moderate volume intra-abdominal ascites. Ultrasound-guided paracentesis was performed yielding 4.0 L of ascitic fluid. Pre-TIPS revision mean Pressures (mmHg): Right atrium: 24 Portal vein: 32 Portosystemic gradient: 8 IMPRESSION: 1. Aborted revision of transjugular intrahepatic portosystemic shunt (TIPS), secondary to extensive shunt and portal venous thrombosis. 2. Successful ultrasound-guided paracentesis yielding 4.0 L of ascitic fluid. RECOMMENDATIONS: CT BRTO protocol to evaluate extent of portal venous thrombosis, and to assist in definitive procedural planning. Consider initiation of heparin  gtt. Thom Hall, MD [facility] Radiology Electronically Signed   By: Thom Hall M.D.   On: 2024/02/21 15:28   CT ANGIO ABD/PELVIS BRTO Result Date: February 21, 2024 CLINICAL DATA:  74 year old with NASH cirrhosis and status post TIPS procedure on 12/14/2023. Recent duplex image demonstrates an occluded TIPS stent. Evaluate portal vein thrombus. EXAM: CTA ABDOMEN AND PELVIS WITHOUT AND WITH CONTRAST TECHNIQUE: Multidetector CT imaging of  the abdomen and pelvis was performed using the standard protocol during bolus administration of intravenous contrast. Multiplanar reconstructed images and MIPs were obtained and reviewed to evaluate the vascular anatomy. RADIATION DOSE REDUCTION: This exam was performed according to the departmental dose-optimization program which includes automated exposure control, adjustment of the mA and/or kV according to patient size and/or use of iterative reconstruction technique. CONTRAST:  OMNIPAQUE  IOHEXOL  350 MG/ML SOLN COMPARISON:  Liver duplex 01/22/2024 and TIPS procedure 12/14/2023 and CTA abdomen and pelvis 11/17/2023 FINDINGS: VASCULAR Aorta: Atherosclerotic disease in the abdominal aorta without aneurysm, dissection or significant stenosis. Celiac: Patent without evidence of aneurysm, dissection, vasculitis or significant stenosis. SMA: Patent without evidence of aneurysm, dissection, vasculitis or significant stenosis. Renals: Both renal arteries are patent without evidence of aneurysm, dissection, vasculitis, fibromuscular dysplasia or significant stenosis. IMA: Patent without evidence of aneurysm, dissection, vasculitis or significant stenosis. Inflow: Patent without evidence of aneurysm, dissection, vasculitis or significant stenosis. Proximal Outflow: Proximal femoral arteries are patent bilaterally. Veins: Gas and high-dense material in the TIPS stent on the non contrast images and most likely associated with recent attempted TIPS revision. TIPS stent is likely occluded. Difficult to evaluate the main portal vein due to artifact from the embolization coils in the left gastric vein. However, there is no definite flow in the main portal vein. No significant flow identified in the left portal vein. Limited evaluation of the right portal vein. Multiple embolization coils in left gastric vein. Again noted are very large gastroesophageal varices around the distal esophagus and gastric cardia region. Again  noted is gas within some of the esophageal varices. Thrombosis involving the main superior mesenteric vein on image 43/5 extending into 2 main branches. In addition, there is thrombosis of the proximal IMV on image 38/5. Portal confluence appears to be thrombosed. Central or proximal aspect of the splenic vein is also thrombosed. Distal splenic vein near the hilum is patent. Main iliac veins and IVC are patent. Bilateral renal veins are patent. There is no significant gastrorenal shunt. TIPS stent involves the middle hepatic vein. This appears to be flow in the left and right hepatic veins. Review of the MIP images confirms the above findings. NON-VASCULAR Lower chest: Large right pleural effusion with compressive atelectasis in the right lower lobe. Hepatobiliary: Liver is small and very nodular. Findings compatible with cirrhosis. Mild distention of the gallbladder. Difficult to exclude small stones or sludge in the base of the gallbladder. No biliary dilatation. Pancreas: New edema surrounding the pancreas and throughout the central abdominal mesentery. No evidence for pancreatic duct  dilatation. Spleen: Spleen is large for size. Again noted is arterial enhancing structure along the inferior aspect of the spleen on image 106/4 that measures 1.3 cm and this is indeterminate but likely an incidental finding. There are new low-density areas involving the posterior aspect of the spleen on image 40/5 that could be related to small splenic infarcts. Adrenals/Urinary Tract: Adrenal glands within normal limits. There is contrast within the collecting system on the non contrast images related to the recent interventional procedure. No hydronephrosis. Bilateral renal cysts. No suspicious renal lesion. Bladder is mildly distended. Small diverticulum along the left posterior aspect of the bladder. Stomach/Bowel: Diverticulosis involving the sigmoid colon. Mild wall thickening involving loops of small bowel in the left lower  quadrant on image 64/5. Diffuse mesenteric edema around the pancreas and central aspect of the mesentery has increased since 11/17/2023. There is a small periumbilical hernia containing a small portion of the small bowel. Lymphatic: No significant abdominal or pelvic lymph node enlargement. Reproductive: Prostate is unremarkable. Other: Bilateral inguinal hernias. Left inguinal hernia contains fat. There is ascites extending into the right inguinal hernia. Small periumbilical hernia containing a loop of small bowel. Small volume of ascites in the abdomen and pelvis. Some of the ascites may be mildly complex or loculated. Musculoskeletal: Disc space narrowing at L5-S1. No acute bone abnormality. IMPRESSION: VASCULAR 1. Thrombosis of the main portal vein, proximal superior mesenteric vein, proximal inferior mesenteric vein and the central aspect of the splenic vein. TIPS stent is thrombosed. Limited evaluation of the intrahepatic portal veins. 2. Large gastroesophageal varices. Prior coil embolization of the left gastric vein. 3.  Aortic Atherosclerosis (ICD10-I70.0). NON-VASCULAR 1. Persistent large right pleural effusion. Findings are compatible with a hepatic hydrothorax. 2. Cirrhosis with portal hypertension. 3. New low-density areas along the posterior and inferior aspect of the spleen. These low-density areas could be related to small infarcts. 4. Increased mesenteric edema particularly in the upper abdomen around the pancreas and near the root of the mesentery. Findings are likely associated with thrombosis involving the portal venous system and proximal mesenteric veins. 5. Bilateral inguinal hernias.  Small umbilical hernia. 6. Small volume of abdominal and pelvic ascites. Some of the ascites may be loculated. 7. Mild wall thickening involving loops of small bowel in the left lower quadrant. Findings are nonspecific but could be associated with venous congestion from the mesenteric venous thrombosis.  Electronically Signed   By: Juliene Balder M.D.   On: 01-31-24 09:11   US  ABD LTD RUG W/LIVER DOPPLER Result Date: 01/22/2024 EXAM: US  Duplex Arterial/Venous of the Abdomen, Complete. TECHNIQUE: Real-time duplex ultrasound scan of the arterial and venous flow of the pelvis with color Doppler flow and spectral waveform analysis. COMPARISON: US  Hepatic liver 01/16/2024. CLINICAL HISTORY: S/P TIPS (transjugular intrahepatic portosystemic shunt). Post-TIPS creation 12/14/2023. FINDINGS: PORTAL VEINS: Normal velocity. Hepatopetal flow. Antegrade flow in the main portal vein. No definite varices were identified. HEPATIC ARTERY: Normal flow. HEPATIC VEINS: Continuous hepatic flow in right and middle hepatic veins. SPLENIC VEIN: Normal flow. TIPS: No definite color flow signal in the TIPS. Nondirectional monophasic flow signal is recorded in the region of the TIPS by the sonographer. LIVER: Nodular hepatic contour. GALLBLADDER: Gallbladder wall thickness 2.9 mm. BILE DUCTS: CBD diameter of 3.8 mm. ABDOMEN: Small volume abdominal ascites. LIMITATIONS/ARTIFACTS: Technically difficult study secondary to body habitus. IMPRESSION: 1. No color flow within the TIPS, with monophasic nondirectional flow in the region of the TIPS, concerning for TIPS dysfunction or occlusion. 2. Small volume  abdominal ascites. Electronically signed by: Katheleen Faes MD MD 01/22/2024 02:04 PM EST RP Workstation: [id number]   IR Paracentesis Result Date: 01/22/2024 INDICATION: 74 year old male with history of MASH and cirrhosis with ascites. History of TIPS procedure performed on 12/14/2023. Received request for diagnostic paracentesis only. EXAM: ULTRASOUND GUIDED DIAGNOSTIC, LEFT-SIDED PARACENTESIS MEDICATIONS: 6 mL 1% lidocaine  with epinephrine  COMPLICATIONS: None immediate. PROCEDURE: Informed written consent was obtained from the patient after a discussion of the risks, benefits and alternatives to treatment. A timeout was performed prior to  the initiation of the procedure. Initial ultrasound scanning demonstrates a small amount of ascites within the right lower abdominal quadrant. The right lower abdomen was prepped and draped in the usual sterile fashion. 1% lidocaine  with epinephrine  was used for local anesthesia. Following this, a 19 gauge, 7-cm, Yueh catheter was introduced. An ultrasound image was saved for documentation purposes. The paracentesis was performed. The catheter was removed and a dressing was applied. The patient tolerated the procedure well without immediate post procedural complication. FINDINGS: A total of approximately 100 mL of cloudy amber fluid was removed. Samples were sent to the laboratory as requested by the clinical team. IMPRESSION: Successful ultrasound-guided paracentesis yielding 100 mL of peritoneal fluid. Performed by: Rayfield Buff, NP under the supervision of Dr. Cordella Banner Electronically Signed   By: Cordella Banner   On: 01/22/2024 12:24   US  Paracentesis Result Date: 01/20/2024 INDICATION: 74 year old male. History of cirrhosis status post tips. Found to have ascites. Request for therapeutic and diagnostic paracentesis. 3 L maximum EXAM: ULTRASOUND GUIDED THERAPEUTIC AND DIAGNOSTIC PARACENTESIS MEDICATIONS: Lidocaine  1% 10 mL COMPLICATIONS: None immediate. PROCEDURE: Informed written consent was obtained from the patient after a discussion of the risks, benefits and alternatives to treatment. A timeout was performed prior to the initiation of the procedure. Initial ultrasound scanning demonstrates a moderate amount of ascites within the right lower abdominal quadrant. The right lower abdomen was prepped and draped in the usual sterile fashion. 1% lidocaine  was used for local anesthesia. Following this, a 19 gauge, 7-cm, Yueh catheter was introduced. An ultrasound image was saved for documentation purposes. The paracentesis was performed. The catheter was removed and a dressing was applied. The  patient tolerated the procedure well without immediate post procedural complication. FINDINGS: A total of approximately 3 liters of straw-colored fluid was removed. Samples were sent to the laboratory as requested by the clinical team. IMPRESSION: Successful ultrasound-guided paracentesis yielding 3 liters of straw-colored peritoneal fluid. Performed by Delon Beagle NP Electronically Signed   By: Wilkie Lent M.D.   On: 01/20/2024 12:33   DG CHEST PORT 1 VIEW Result Date: 01/18/2024 EXAM: 1 VIEW(S) XRAY OF THE CHEST 01/18/2024 04:14:00 PM COMPARISON: 01/16/2024 CLINICAL HISTORY: Labored breathing FINDINGS: LUNGS AND PLEURA: Persistent large right pleural effusion with associated hazy opacity throughout the right lung, likely reflecting compressive atelectasis. No pneumothorax. HEART AND MEDIASTINUM: Aortic atherosclerosis. No acute abnormality of the cardiac and mediastinal silhouettes. BONES AND SOFT TISSUES: No acute osseous abnormality. IMPRESSION: 1. Persistent large right pleural effusion with associated hazy opacity throughout the right lung, likely reflecting compressive atelectasis. Electronically signed by: Greig Pique MD MD 01/18/2024 07:54 PM EST RP Workstation: [id number]   US  RENAL Result Date: 01/18/2024 CLINICAL DATA:  Acute kidney injury EXAM: RENAL / URINARY TRACT ULTRASOUND COMPLETE COMPARISON:  None Available. FINDINGS: Right Kidney: Renal measurements: 9.6 cm x 5.3 cm x 5.3 cm = volume: 139.9 mL. Echogenicity within normal limits. No mass or hydronephrosis visualized. Left Kidney: Renal  measurements: 10.9 cm x 6.1 cm x 6.0 cm = volume: 210.9 mL. Echogenicity within normal limits. No mass or hydronephrosis visualized. Bladder: Appears normal for degree of bladder distention. Other: Of incidental note is the presence of ascites throughout the abdomen. The study is technically limited secondary to the patient's body habitus and lack of patient cooperation, as per the ultrasound  technologist. IMPRESSION: 1. Ascites. 2. Normal ultrasonographic appearance of the kidneys. Electronically Signed   By: Suzen Dials M.D.   On: 01/18/2024 14:53   IR ABDOMEN US  LIMITED Result Date: 01/17/2024 INDICATION: 74 year old male with history of MASH and cirrhosis with ascites. History of TIPS procedure performed on 12/14/2023. Received request for diagnostic and therapeutic paracentesis. EXAM: ULTRASOUND ABDOMEN LIMITED FINDINGS: All four abdominal quadrants were examined. There was no significant pocket of fluid to allow for safe approach for paracentesis. IMPRESSION: All four abdominal quadrants were examined. There was no significant pocket of fluid to allow for safe approach for paracentesis. No procedure performed. Read by: Rayfield Buff, NP under the supervision of Dr. Cordella Banner Electronically Signed   By: Cordella Banner   On: 01/17/2024 13:37   US  LIVER DOPPLER Result Date: 01/16/2024 EXAM: Ultrasound Hepatic Liver Doppler 01/16/2024 04:45:53 PM TECHNIQUE: Real-time ultrasonography of the right upper quadrant of the abdomen was performed. COMPARISON: US  Abdomen 01/16/2024 03:14 PM. CLINICAL HISTORY: Ascites. FINDINGS: LIVER: Changes of hepatic cirrhosis with diffuse hepatic parenchymal atrophy and nodular contour. No focal lesions are identified. No intrahepatic biliary ductal dilatation. Varices are absent. BILIARY SYSTEM: Gallbladder wall thickness measures 5.5 mm. No pericholecystic fluid. No cholelithiasis. Negative sonographic Murphy's sign. The common bile duct measures 2.7 mm. VASCULAR: Portal vein:   Unable to visualize due to technical factors. Tips stent: No flow is seen in the tip of the stent on color flow doppler images. Flow is demonstrated with spectral doppler . Low amplitude venous waveforms are obtained. Proximal at portal, 36.8 cm per second. Mid, 30.6 cm per second Distal at hepatic vein, 57.2 cm per second. . IVC is patent. Hepatic veins: Right, 20.17 cm per  second, hepatofugal flow. Mid, 45.6 cm per second, hepatofugal flow. Left, unable to visualize due to technical factors. Splenic vein, 34.3 cm per second. Superior mesenteric vein, unable to visualize due to technical factors. Hepatic artery, patent with normal arterial waveform. Aorta: unable to visualize due to bowel gas. OTHER: Moderate ascites is present. Small right pleural effusion. LIMITATIONS/ARTIFACTS: Examination is technically limited due to patient unable to hold breath and heavy breathing during examination resulting in motion artifact. Overlying bowel gas obscures some areas. IMPRESSION: 1. Moderate ascites. 2. Changes of hepatic cirrhosis with diffuse hepatic parenchymal atrophy and nodular contour, without focal hepatic lesion. 3. TIPS stent is patent, with low flow venous waveform obtained. Doppler evaluation limited by technical factors. 4. Small right pleural effusion, and no sonographic evidence of varices. Electronically signed by: Elsie Gravely MD 01/16/2024 05:31 PM EST RP Workstation: [id number]   US  ASCITES (ABDOMEN LIMITED) Result Date: 01/16/2024 EXAM: LIMITED ABDOMINAL ULTRASOUND FOR ASCITES EVALUATION TECHNIQUE: Limited real-time sonography of all 4 quadrants of the abdomen was performed for evaluation of ascites. COMPARISON: US  Abdomen 06/14/2023; 01/18/2023. CLINICAL HISTORY: Ascites. FINDINGS: RIGHT UPPER QUADRANT: Large volume ascites seen. LEFT UPPER QUADRANT: Large volume ascites seen. RIGHT LOWER QUADRANT: Large volume ascites seen. LEFT LOWER QUADRANT: Large volume ascites seen. OTHER: Limited visualization of the rest of the abdomen demonstrates no acute abnormality. IMPRESSION: 1. Large volume ascites Electronically signed by: Rogelia Myers  MD 01/16/2024 04:54 PM EST RP Workstation: [id number]   CT HEAD WO CONTRAST Result Date: 01/16/2024 EXAM: CT HEAD WITHOUT 01/16/2024 11:25:51 AM TECHNIQUE: CT of the head was performed without the administration of intravenous  contrast. Automated exposure control, iterative reconstruction, and/or weight based adjustment of the mA/kV was utilized to reduce the radiation dose to as low as reasonably achievable. COMPARISON: None available. CLINICAL HISTORY: 74 year old male with cirrhosis, altered mental status, and confusion for 2 days. FINDINGS: BRAIN AND VENTRICLES: No acute intracranial hemorrhage. No mass effect or midline shift. No extra-axial fluid collection. No evidence of acute infarct. No hydrocephalus. Brain volume within normal limits for age. Small but circumscribed chronic appearing infarct in the central right cerebellum on series 2 image 9. Elsewhere gray white differentiation is within normal limits for age. No suspicious intracranial vascular hyperdensity. No acute traumatic injury identified. ORBITS: No acute abnormality. SINUSES AND MASTOIDS: Incidental right maxillary alveolar recess retention cyst. Paranasal sinuses, tympanic cavities and mastoids are well aerated. SOFT TISSUES AND SKULL: No acute skull fracture. No acute soft tissue abnormality. Calcified atherosclerosis at the skull base. IMPRESSION: 1. No acute intracranial abnormality. Chronic appearing small right cerebellar infarct. Electronically signed by: Helayne Hurst MD 01/16/2024 11:57 AM EST RP Workstation: [id number]   DG Chest Portable 1 View Result Date: 01/16/2024 CLINICAL DATA:  Shortness of breath.  Altered mental status. EXAM: PORTABLE CHEST 1 VIEW COMPARISON:  01/05/2024 FINDINGS: Lungs are hypoinflated demonstrate interval worsening hazy opacification over the mid to lower right lung suggesting moderate size effusion likely with associated compressive atelectasis in the right base. Patient is slightly rotated to the left. Left lung is otherwise clear. Cardiomediastinal silhouette and remainder of the exam is unchanged. IMPRESSION: Interval worsening hazy opacification over the mid to lower right lung suggesting moderate size effusion likely with  associated compressive atelectasis in the right base. Electronically Signed   By: Toribio Agreste M.D.   On: 01/16/2024 10:54   US  Paracentesis Result Date: 01/08/2024 INDICATION: Patient with history of decompensated NASH cirrhosis, recurrent right hepatic hydrothorax, ascites, recent TIPS on 12/14/2023; presents today for therapeutic paracentesis. EXAM: ULTRASOUND GUIDED THERAPEUTIC PARACENTESIS MEDICATIONS: 8 mL 1% lidocaine  to skin/subcutaneous tissue COMPLICATIONS: None immediate. PROCEDURE: Informed written consent was obtained from the patient after a discussion of the risks, benefits and alternatives to treatment. A timeout was performed prior to the initiation of the procedure. Initial ultrasound scanning demonstrates a large amount of ascites within the right lower abdominal quadrant. The right lower abdomen was prepped and draped in the usual sterile fashion. 1% lidocaine  was used for local anesthesia. Following this, a 6 Fr Safe-T-Centesis catheter was introduced. An ultrasound image was saved for documentation purposes. The paracentesis was performed. The catheter was removed and a dressing was applied. The patient tolerated the procedure well without immediate post procedural complication. FINDINGS: A total of approximately 2 liters of yellow fluid was removed. Due to patient hypotension only the above amount of fluid was removed today. IMPRESSION: Successful ultrasound-guided paracentesis yielding 2 liters of peritoneal fluid. Performed by: Franky Rakers, PA-C Electronically Signed   By: Juliene Balder M.D.   On: 01/08/2024 18:17   US  THORACENTESIS ASP PLEURAL SPACE W/IMG GUIDE Result Date: 01/05/2024 INDICATION: Patient with history of decompensated NASH cirrhosis with recurrent right hepatic hydrothorax, ascites, recent TIPS on 12/14/2023; presents today for therapeutic right thoracentesis EXAM: ULTRASOUND GUIDED THERAPEUTIC RIGHT THORACENTESIS MEDICATIONS: 8 mL 1% lidocaine  with epinephrine  to  skin/subcutaneous tissue COMPLICATIONS: None immediate. PROCEDURE: An ultrasound guided thoracentesis  was thoroughly discussed with the patient and questions answered. The benefits, risks, alternatives and complications were also discussed. The patient understands and wishes to proceed with the procedure. Written consent was obtained. Ultrasound was performed to localize and mark an adequate pocket of fluid in the right chest. The area was then prepped and draped in the normal sterile fashion. 1% Lidocaine  was used for local anesthesia. Under ultrasound guidance a 6 Fr Safe-T-Centesis catheter was introduced. Thoracentesis was performed. The catheter was removed and a dressing applied. FINDINGS: A total of approximately 2.4 liters of yellow fluid was removed. Due to patient coughing only the above amount of fluid was removed today. IMPRESSION: Successful ultrasound guided therapeutic right thoracentesis yielding 2.4 liters of pleural fluid. Performed by: Franky Rakers, PA-C Electronically Signed   By: Cordella Banner   On: 01/05/2024 15:54   DG Chest 1 View Result Date: 01/05/2024 CLINICAL DATA:  Status post thoracentesis EXAM: CHEST  1 VIEW COMPARISON:  December 15, 2023. FINDINGS: The heart size and mediastinal contours are within normal limits. Right pleural effusion is significantly smaller status post thoracentesis. No definite pneumothorax is noted. The visualized skeletal structures are unremarkable. IMPRESSION: Right pleural effusion is significantly smaller status post thoracentesis. No definite pneumothorax is noted. Electronically Signed   By: Lynwood Landy Raddle M.D.   On: 01/05/2024 15:44    Microbiology: Recent Results (from the past 240 hours)  Body fluid culture w Gram Stain     Status: None   Collection Time: 01/20/24 12:35 PM   Specimen: PATH Cytology Peritoneal fluid  Result Value Ref Range Status   Specimen Description PERITONEAL  Final   Special Requests NONE  Final   Gram Stain NO WBC  SEEN NO ORGANISMS SEEN   Final   Culture   Final    NO GROWTH 3 DAYS Performed at [facility], [address], [place], [place] [address]    Report Status 01/23/2024 FINAL  Final  Culture, body fluid w Gram Stain-bottle     Status: None (Preliminary result)   Collection Time: 01/22/24 11:01 AM   Specimen: Fluid  Result Value Ref Range Status   Specimen Description FLUID PERITONEAL ABDOMEN  Final   Special Requests BOTTLES DRAWN AEROBIC AND ANAEROBIC  Final   Culture   Final    NO GROWTH 3 DAYS Performed at [facility], [address], [place], [place] [address]    Report Status PENDING  Incomplete  Gram stain     Status: None   Collection Time: 01/22/24 11:01 AM   Specimen: Fluid  Result Value Ref Range Status   Specimen Description FLUID PERITONEAL ABDOMEN  Final   Special Requests NONE  Final   Gram Stain   Final    FEW WBC PRESENT, PREDOMINANTLY MONONUCLEAR NO ORGANISMS SEEN Performed at [facility], [address], [place], [place] [address]    Report Status 01/22/2024 FINAL  Final  MRSA Next Gen by PCR, Nasal     Status: None   Collection Time: 02/16/24  5:50 PM   Specimen: Nasal Mucosa; Nasal Swab  Result Value Ref Range Status   MRSA by PCR Next Gen NOT DETECTED NOT DETECTED Final    Comment: (NOTE) The GeneXpert MRSA Assay (FDA approved for NASAL specimens only), is one component of a comprehensive MRSA colonization surveillance program. It is not intended to diagnose MRSA infection nor to guide or monitor treatment for MRSA infections. Test performance is not FDA approved in  patients less than 75 years old. Performed at [facility], [address], [place], [place] [address]     Time spent: 15 minutes  Signed: Tamela Stakes, MD    "

## 2024-02-11 NOTE — Progress Notes (Addendum)
 Patient became hypotensive at ~2100.   Harlene Ravel RN notified. Dr. Kassie ordered to give 25 grams of albumin , Levophed  gt and LR bolus.   Patient remained hypotensive with levophed  gtt infusing.   RN Harlene called requesting ground team STAT for central venous access.   RN noticed bleeding from the patients gums and patient became unresponsive. RT began bagging. tPA stopped.   Dr. Jude arrived bedside and intubated. Copious amounts of blood noted.  Massive Transfusion Protocol initiated. A total of 8 PRBC unit , 8 FFP units , and 1 cryo given.   7 liters of blood was collected in suction canisters from oral gastric tube and oropharyngeal suctioning.   Resuscitation Medications per provider.  2235 1mg  Epi  2250 1amp Bicarb  2254 0.5mg  Epi 2257 20mg  Etomidate  (Intubated) 2305 1mg  EPI 2306 1amp Bicarb  2314 1 amp Bicarb  2321 25mg  Protamine  for heparin  reversal  2336 1mg  Epi, 1 amp Bicarb.   Time of Death pronounced with Ava, RN at 2357. Wife at bedside, Chaplain called.

## 2024-02-11 NOTE — Procedures (Signed)
 Extubation Procedure Note  Patient Details:   Name: David Esparza DOB: 1950/02/23 MRN: [id number]   Airway Documentation:    Vent end date: 2024-02-19 Vent end time: 2357   Evaluation  [name] 01/26/2024, 1:00 AM

## 2024-02-11 DEATH — deceased

## 2024-03-05 ENCOUNTER — Ambulatory Visit: Payer: Medicare Other

## 2024-03-28 ENCOUNTER — Ambulatory Visit (HOSPITAL_BASED_OUTPATIENT_CLINIC_OR_DEPARTMENT_OTHER): Admitting: Pulmonary Disease
